# Patient Record
Sex: Female | Born: 1950 | Race: Black or African American | Hispanic: No | Marital: Married | Smoking: Former smoker
Health system: Southern US, Community
[De-identification: ages and names within clinical notes are randomized; demographics above are authoritative.]

## PROBLEM LIST (undated history)

## (undated) DIAGNOSIS — F988 Other specified behavioral and emotional disorders with onset usually occurring in childhood and adolescence: Secondary | ICD-10-CM

## (undated) DIAGNOSIS — H269 Unspecified cataract: Secondary | ICD-10-CM

## (undated) DIAGNOSIS — M461 Sacroiliitis, not elsewhere classified: Secondary | ICD-10-CM

## (undated) DIAGNOSIS — Z87898 Personal history of other specified conditions: Secondary | ICD-10-CM

## (undated) DIAGNOSIS — J0101 Acute recurrent maxillary sinusitis: Secondary | ICD-10-CM

## (undated) DIAGNOSIS — R112 Nausea with vomiting, unspecified: Secondary | ICD-10-CM

## (undated) DIAGNOSIS — G5603 Carpal tunnel syndrome, bilateral upper limbs: Secondary | ICD-10-CM

## (undated) DIAGNOSIS — I10 Essential (primary) hypertension: Secondary | ICD-10-CM

## (undated) DIAGNOSIS — D649 Anemia, unspecified: Secondary | ICD-10-CM

## (undated) DIAGNOSIS — E119 Type 2 diabetes mellitus without complications: Secondary | ICD-10-CM

## (undated) DIAGNOSIS — IMO0002 Reserved for concepts with insufficient information to code with codable children: Secondary | ICD-10-CM

## (undated) DIAGNOSIS — J302 Other seasonal allergic rhinitis: Secondary | ICD-10-CM

## (undated) DIAGNOSIS — Z8711 Personal history of peptic ulcer disease: Secondary | ICD-10-CM

## (undated) DIAGNOSIS — E871 Hypo-osmolality and hyponatremia: Secondary | ICD-10-CM

## (undated) DIAGNOSIS — K219 Gastro-esophageal reflux disease without esophagitis: Secondary | ICD-10-CM

## (undated) DIAGNOSIS — Z8739 Personal history of other diseases of the musculoskeletal system and connective tissue: Secondary | ICD-10-CM

## (undated) DIAGNOSIS — T7840XA Allergy, unspecified, initial encounter: Secondary | ICD-10-CM

## (undated) DIAGNOSIS — Z9889 Other specified postprocedural states: Secondary | ICD-10-CM

## (undated) DIAGNOSIS — J019 Acute sinusitis, unspecified: Secondary | ICD-10-CM

## (undated) DIAGNOSIS — N3941 Urge incontinence: Secondary | ICD-10-CM

## (undated) DIAGNOSIS — Z96659 Presence of unspecified artificial knee joint: Secondary | ICD-10-CM

## (undated) DIAGNOSIS — M4712 Other spondylosis with myelopathy, cervical region: Secondary | ICD-10-CM

## (undated) DIAGNOSIS — J3089 Other allergic rhinitis: Secondary | ICD-10-CM

## (undated) DIAGNOSIS — M19041 Primary osteoarthritis, right hand: Secondary | ICD-10-CM

## (undated) DIAGNOSIS — K649 Unspecified hemorrhoids: Secondary | ICD-10-CM

## (undated) DIAGNOSIS — M948X9 Other specified disorders of cartilage, unspecified sites: Secondary | ICD-10-CM

## (undated) DIAGNOSIS — D259 Leiomyoma of uterus, unspecified: Secondary | ICD-10-CM

## (undated) DIAGNOSIS — J0191 Acute recurrent sinusitis, unspecified: Secondary | ICD-10-CM

## (undated) DIAGNOSIS — H65199 Other acute nonsuppurative otitis media, unspecified ear: Secondary | ICD-10-CM

## (undated) DIAGNOSIS — M1990 Unspecified osteoarthritis, unspecified site: Secondary | ICD-10-CM

## (undated) DIAGNOSIS — E7800 Pure hypercholesterolemia, unspecified: Secondary | ICD-10-CM

## (undated) DIAGNOSIS — Z9289 Personal history of other medical treatment: Secondary | ICD-10-CM

## (undated) HISTORY — DX: Unspecified cataract: H26.9

## (undated) HISTORY — DX: Other allergic rhinitis: J30.89

## (undated) HISTORY — DX: Other seasonal allergic rhinitis: J30.2

## (undated) HISTORY — DX: Other specified behavioral and emotional disorders with onset usually occurring in childhood and adolescence: F98.8

## (undated) HISTORY — DX: Carpal tunnel syndrome, bilateral upper limbs: G56.03

## (undated) HISTORY — DX: Essential (primary) hypertension: I10

## (undated) HISTORY — DX: Type 2 diabetes mellitus without complications: E11.9

## (undated) HISTORY — DX: Pure hypercholesterolemia, unspecified: E78.00

## (undated) HISTORY — DX: Other specified disorders of cartilage, unspecified sites: M94.8X9

## (undated) HISTORY — DX: Other spondylosis with myelopathy, cervical region: M47.12

## (undated) HISTORY — DX: Hypo-osmolality and hyponatremia: E87.1

## (undated) HISTORY — DX: Acute recurrent maxillary sinusitis: J01.01

## (undated) HISTORY — DX: Personal history of other specified conditions: Z87.898

## (undated) HISTORY — DX: Presence of unspecified artificial knee joint: Z96.659

## (undated) HISTORY — DX: Allergy, unspecified, initial encounter: T78.40XA

## (undated) HISTORY — DX: Personal history of peptic ulcer disease: Z87.11

## (undated) HISTORY — DX: Sacroiliitis, not elsewhere classified: M46.1

## (undated) HISTORY — DX: Personal history of other medical treatment: Z92.89

## (undated) HISTORY — DX: Unspecified osteoarthritis, unspecified site: M19.90

## (undated) HISTORY — DX: Urge incontinence: N39.41

## (undated) HISTORY — DX: Other acute nonsuppurative otitis media, unspecified ear: H65.199

## (undated) HISTORY — DX: Personal history of other diseases of the musculoskeletal system and connective tissue: Z87.39

## (undated) HISTORY — DX: Acute sinusitis, unspecified: J01.90

## (undated) HISTORY — PX: BREAST LUMPECTOMY: SHX2

## (undated) HISTORY — PX: REPLACEMENT TOTAL KNEE BILATERAL: SUR1225

## (undated) HISTORY — DX: Unspecified hemorrhoids: K64.9

---
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
## 1998-03-11 ENCOUNTER — Encounter: Admission: RE | Admit: 1998-03-11 | Discharge: 1998-03-11 | Payer: Self-pay | Admitting: Family Medicine

## 1998-03-28 ENCOUNTER — Encounter: Admission: RE | Admit: 1998-03-28 | Discharge: 1998-03-28 | Payer: Self-pay | Admitting: Sports Medicine

## 1998-12-19 ENCOUNTER — Encounter: Admission: RE | Admit: 1998-12-19 | Discharge: 1998-12-19 | Payer: Self-pay | Admitting: Family Medicine

## 1999-01-27 ENCOUNTER — Encounter: Admission: RE | Admit: 1999-01-27 | Discharge: 1999-01-27 | Payer: Self-pay | Admitting: Family Medicine

## 1999-06-09 ENCOUNTER — Encounter: Admission: RE | Admit: 1999-06-09 | Discharge: 1999-06-09 | Payer: Self-pay | Admitting: Family Medicine

## 1999-06-25 ENCOUNTER — Encounter: Admission: RE | Admit: 1999-06-25 | Discharge: 1999-06-25 | Payer: Self-pay | Admitting: Family Medicine

## 1999-07-15 ENCOUNTER — Encounter: Admission: RE | Admit: 1999-07-15 | Discharge: 1999-07-15 | Payer: Self-pay | Admitting: Family Medicine

## 1999-08-18 ENCOUNTER — Encounter: Admission: RE | Admit: 1999-08-18 | Discharge: 1999-08-18 | Payer: Self-pay | Admitting: Family Medicine

## 1999-11-17 ENCOUNTER — Encounter: Admission: RE | Admit: 1999-11-17 | Discharge: 1999-11-17 | Payer: Self-pay | Admitting: Family Medicine

## 1999-12-23 ENCOUNTER — Encounter: Admission: RE | Admit: 1999-12-23 | Discharge: 1999-12-23 | Payer: Self-pay | Admitting: Sports Medicine

## 2000-07-01 ENCOUNTER — Encounter: Admission: RE | Admit: 2000-07-01 | Discharge: 2000-07-01 | Payer: Self-pay | Admitting: Family Medicine

## 2000-07-02 ENCOUNTER — Encounter: Admission: RE | Admit: 2000-07-02 | Discharge: 2000-07-02 | Payer: Self-pay | Admitting: Family Medicine

## 2000-07-15 ENCOUNTER — Encounter: Admission: RE | Admit: 2000-07-15 | Discharge: 2000-07-15 | Payer: Self-pay | Admitting: Family Medicine

## 2000-08-16 ENCOUNTER — Encounter: Admission: RE | Admit: 2000-08-16 | Discharge: 2000-08-16 | Payer: Self-pay | Admitting: Family Medicine

## 2000-09-13 ENCOUNTER — Encounter: Admission: RE | Admit: 2000-09-13 | Discharge: 2000-09-13 | Payer: Self-pay | Admitting: Family Medicine

## 2000-11-15 ENCOUNTER — Encounter: Admission: RE | Admit: 2000-11-15 | Discharge: 2000-11-15 | Payer: Self-pay | Admitting: Family Medicine

## 2000-12-17 HISTORY — PX: COLONOSCOPY W/ POLYPECTOMY: SHX1380

## 2000-12-24 ENCOUNTER — Encounter: Admission: RE | Admit: 2000-12-24 | Discharge: 2000-12-24 | Payer: Self-pay | Admitting: Family Medicine

## 2000-12-31 ENCOUNTER — Ambulatory Visit (HOSPITAL_COMMUNITY): Admission: RE | Admit: 2000-12-31 | Discharge: 2000-12-31 | Payer: Self-pay | Admitting: Gastroenterology

## 2001-05-05 ENCOUNTER — Encounter: Admission: RE | Admit: 2001-05-05 | Discharge: 2001-05-05 | Payer: Self-pay | Admitting: Family Medicine

## 2001-07-20 ENCOUNTER — Encounter: Admission: RE | Admit: 2001-07-20 | Discharge: 2001-07-20 | Payer: Self-pay | Admitting: Family Medicine

## 2001-08-22 ENCOUNTER — Encounter: Admission: RE | Admit: 2001-08-22 | Discharge: 2001-08-22 | Payer: Self-pay | Admitting: Family Medicine

## 2002-01-23 ENCOUNTER — Encounter: Admission: RE | Admit: 2002-01-23 | Discharge: 2002-01-23 | Payer: Self-pay | Admitting: Family Medicine

## 2002-05-22 ENCOUNTER — Encounter: Admission: RE | Admit: 2002-05-22 | Discharge: 2002-05-22 | Payer: Self-pay | Admitting: Family Medicine

## 2002-10-23 ENCOUNTER — Encounter: Admission: RE | Admit: 2002-10-23 | Discharge: 2002-10-23 | Payer: Self-pay | Admitting: Family Medicine

## 2002-11-27 ENCOUNTER — Encounter: Admission: RE | Admit: 2002-11-27 | Discharge: 2002-11-27 | Payer: Self-pay | Admitting: Family Medicine

## 2003-02-22 ENCOUNTER — Encounter: Admission: RE | Admit: 2003-02-22 | Discharge: 2003-02-22 | Payer: Self-pay | Admitting: Family Medicine

## 2003-04-30 ENCOUNTER — Encounter: Admission: RE | Admit: 2003-04-30 | Discharge: 2003-04-30 | Payer: Self-pay | Admitting: Family Medicine

## 2003-05-28 ENCOUNTER — Encounter: Payer: Self-pay | Admitting: Family Medicine

## 2003-05-28 ENCOUNTER — Encounter: Admission: RE | Admit: 2003-05-28 | Discharge: 2003-05-28 | Payer: Self-pay | Admitting: Family Medicine

## 2003-06-04 ENCOUNTER — Encounter: Admission: RE | Admit: 2003-06-04 | Discharge: 2003-06-04 | Payer: Self-pay | Admitting: Family Medicine

## 2003-06-05 ENCOUNTER — Encounter: Admission: RE | Admit: 2003-06-05 | Discharge: 2003-06-20 | Payer: Self-pay | Admitting: Family Medicine

## 2003-07-09 ENCOUNTER — Encounter: Admission: RE | Admit: 2003-07-09 | Discharge: 2003-07-09 | Payer: Self-pay | Admitting: Family Medicine

## 2003-07-30 ENCOUNTER — Encounter: Admission: RE | Admit: 2003-07-30 | Discharge: 2003-07-30 | Payer: Self-pay | Admitting: Family Medicine

## 2003-07-31 ENCOUNTER — Encounter: Admission: RE | Admit: 2003-07-31 | Discharge: 2003-07-31 | Payer: Self-pay | Admitting: Family Medicine

## 2003-10-22 ENCOUNTER — Encounter: Admission: RE | Admit: 2003-10-22 | Discharge: 2003-10-22 | Payer: Self-pay | Admitting: Family Medicine

## 2003-12-13 ENCOUNTER — Encounter: Admission: RE | Admit: 2003-12-13 | Discharge: 2003-12-13 | Payer: Self-pay | Admitting: Family Medicine

## 2003-12-17 ENCOUNTER — Encounter: Admission: RE | Admit: 2003-12-17 | Discharge: 2003-12-17 | Payer: Self-pay | Admitting: Family Medicine

## 2003-12-27 ENCOUNTER — Encounter: Admission: RE | Admit: 2003-12-27 | Discharge: 2003-12-27 | Payer: Self-pay | Admitting: Family Medicine

## 2004-01-31 ENCOUNTER — Encounter: Admission: RE | Admit: 2004-01-31 | Discharge: 2004-01-31 | Payer: Self-pay | Admitting: Family Medicine

## 2004-02-07 ENCOUNTER — Encounter: Admission: RE | Admit: 2004-02-07 | Discharge: 2004-02-07 | Payer: Self-pay | Admitting: Family Medicine

## 2004-02-11 ENCOUNTER — Encounter: Admission: RE | Admit: 2004-02-11 | Discharge: 2004-02-11 | Payer: Self-pay | Admitting: Family Medicine

## 2004-02-25 ENCOUNTER — Encounter: Admission: RE | Admit: 2004-02-25 | Discharge: 2004-02-25 | Payer: Self-pay | Admitting: Family Medicine

## 2004-04-18 HISTORY — PX: NM MYOVIEW LTD: HXRAD82

## 2004-04-28 ENCOUNTER — Inpatient Hospital Stay (HOSPITAL_COMMUNITY): Admission: EM | Admit: 2004-04-28 | Discharge: 2004-04-30 | Payer: Self-pay | Admitting: Emergency Medicine

## 2004-05-15 ENCOUNTER — Encounter: Admission: RE | Admit: 2004-05-15 | Discharge: 2004-05-15 | Payer: Self-pay | Admitting: Family Medicine

## 2004-06-16 ENCOUNTER — Encounter: Admission: RE | Admit: 2004-06-16 | Discharge: 2004-06-16 | Payer: Self-pay | Admitting: Family Medicine

## 2004-06-20 ENCOUNTER — Emergency Department (HOSPITAL_COMMUNITY): Admission: EM | Admit: 2004-06-20 | Discharge: 2004-06-20 | Payer: Self-pay | Admitting: Family Medicine

## 2004-07-28 ENCOUNTER — Ambulatory Visit: Payer: Self-pay | Admitting: Family Medicine

## 2004-08-04 ENCOUNTER — Ambulatory Visit: Payer: Self-pay | Admitting: Family Medicine

## 2004-08-07 ENCOUNTER — Ambulatory Visit (HOSPITAL_COMMUNITY): Admission: RE | Admit: 2004-08-07 | Discharge: 2004-08-07 | Payer: Self-pay | Admitting: Family Medicine

## 2004-08-19 HISTORY — PX: ENDOMETRIAL BIOPSY: SHX622

## 2004-09-01 ENCOUNTER — Ambulatory Visit: Payer: Self-pay | Admitting: Family Medicine

## 2004-10-16 ENCOUNTER — Ambulatory Visit: Payer: Self-pay | Admitting: Family Medicine

## 2004-10-19 DIAGNOSIS — Z9289 Personal history of other medical treatment: Secondary | ICD-10-CM

## 2004-10-19 HISTORY — DX: Personal history of other medical treatment: Z92.89

## 2004-11-13 ENCOUNTER — Ambulatory Visit: Payer: Self-pay | Admitting: Family Medicine

## 2004-11-17 ENCOUNTER — Inpatient Hospital Stay (HOSPITAL_COMMUNITY): Admission: RE | Admit: 2004-11-17 | Discharge: 2004-11-21 | Payer: Self-pay | Admitting: Orthopedic Surgery

## 2004-11-17 ENCOUNTER — Ambulatory Visit: Payer: Self-pay | Admitting: Physical Medicine & Rehabilitation

## 2004-11-17 ENCOUNTER — Ambulatory Visit: Payer: Self-pay | Admitting: Sports Medicine

## 2004-11-27 ENCOUNTER — Ambulatory Visit: Payer: Self-pay | Admitting: Family Medicine

## 2005-01-12 ENCOUNTER — Ambulatory Visit: Payer: Self-pay | Admitting: Family Medicine

## 2005-03-09 ENCOUNTER — Ambulatory Visit: Payer: Self-pay | Admitting: Family Medicine

## 2005-05-11 ENCOUNTER — Inpatient Hospital Stay (HOSPITAL_COMMUNITY): Admission: RE | Admit: 2005-05-11 | Discharge: 2005-05-14 | Payer: Self-pay | Admitting: Orthopedic Surgery

## 2005-07-02 ENCOUNTER — Ambulatory Visit: Payer: Self-pay | Admitting: Family Medicine

## 2005-07-23 ENCOUNTER — Ambulatory Visit: Payer: Self-pay | Admitting: Family Medicine

## 2005-08-17 ENCOUNTER — Ambulatory Visit: Payer: Self-pay | Admitting: Family Medicine

## 2005-09-17 ENCOUNTER — Ambulatory Visit: Payer: Self-pay | Admitting: Family Medicine

## 2006-01-18 ENCOUNTER — Ambulatory Visit: Payer: Self-pay | Admitting: Family Medicine

## 2006-02-22 ENCOUNTER — Ambulatory Visit: Payer: Self-pay | Admitting: Family Medicine

## 2006-03-19 ENCOUNTER — Encounter (INDEPENDENT_AMBULATORY_CARE_PROVIDER_SITE_OTHER): Payer: Self-pay | Admitting: *Deleted

## 2006-03-29 ENCOUNTER — Encounter: Payer: Self-pay | Admitting: Family Medicine

## 2006-03-29 ENCOUNTER — Ambulatory Visit: Payer: Self-pay | Admitting: Family Medicine

## 2006-04-09 ENCOUNTER — Ambulatory Visit: Payer: Self-pay | Admitting: Family Medicine

## 2006-08-02 ENCOUNTER — Ambulatory Visit: Payer: Self-pay | Admitting: Family Medicine

## 2006-09-20 ENCOUNTER — Ambulatory Visit: Payer: Self-pay | Admitting: Family Medicine

## 2006-12-16 DIAGNOSIS — E669 Obesity, unspecified: Secondary | ICD-10-CM | POA: Insufficient documentation

## 2006-12-16 DIAGNOSIS — E119 Type 2 diabetes mellitus without complications: Secondary | ICD-10-CM

## 2006-12-16 DIAGNOSIS — D126 Benign neoplasm of colon, unspecified: Secondary | ICD-10-CM

## 2006-12-16 DIAGNOSIS — J302 Other seasonal allergic rhinitis: Secondary | ICD-10-CM

## 2006-12-16 DIAGNOSIS — Z8711 Personal history of peptic ulcer disease: Secondary | ICD-10-CM

## 2006-12-16 DIAGNOSIS — N3941 Urge incontinence: Secondary | ICD-10-CM

## 2006-12-16 DIAGNOSIS — F988 Other specified behavioral and emotional disorders with onset usually occurring in childhood and adolescence: Secondary | ICD-10-CM

## 2006-12-16 DIAGNOSIS — M159 Polyosteoarthritis, unspecified: Secondary | ICD-10-CM

## 2006-12-16 DIAGNOSIS — M1990 Unspecified osteoarthritis, unspecified site: Secondary | ICD-10-CM

## 2006-12-16 DIAGNOSIS — J3089 Other allergic rhinitis: Secondary | ICD-10-CM

## 2006-12-16 DIAGNOSIS — I10 Essential (primary) hypertension: Secondary | ICD-10-CM

## 2006-12-16 DIAGNOSIS — K649 Unspecified hemorrhoids: Secondary | ICD-10-CM

## 2006-12-16 DIAGNOSIS — E1159 Type 2 diabetes mellitus with other circulatory complications: Secondary | ICD-10-CM

## 2006-12-16 DIAGNOSIS — K219 Gastro-esophageal reflux disease without esophagitis: Secondary | ICD-10-CM | POA: Insufficient documentation

## 2006-12-16 HISTORY — DX: Personal history of peptic ulcer disease: Z87.11

## 2006-12-16 HISTORY — DX: Urge incontinence: N39.41

## 2006-12-16 HISTORY — DX: Unspecified hemorrhoids: K64.9

## 2006-12-16 HISTORY — DX: Other seasonal allergic rhinitis: J30.2

## 2006-12-16 HISTORY — DX: Benign neoplasm of colon, unspecified: D12.6

## 2006-12-16 HISTORY — DX: Essential (primary) hypertension: I10

## 2006-12-16 HISTORY — DX: Other specified behavioral and emotional disorders with onset usually occurring in childhood and adolescence: F98.8

## 2006-12-16 HISTORY — DX: Unspecified osteoarthritis, unspecified site: M19.90

## 2006-12-16 HISTORY — DX: Type 2 diabetes mellitus without complications: E11.9

## 2006-12-17 ENCOUNTER — Encounter (INDEPENDENT_AMBULATORY_CARE_PROVIDER_SITE_OTHER): Payer: Self-pay | Admitting: *Deleted

## 2007-01-27 ENCOUNTER — Telehealth: Payer: Self-pay | Admitting: Family Medicine

## 2007-02-28 ENCOUNTER — Ambulatory Visit: Payer: Self-pay | Admitting: Family Medicine

## 2007-02-28 DIAGNOSIS — E7800 Pure hypercholesterolemia, unspecified: Secondary | ICD-10-CM

## 2007-02-28 DIAGNOSIS — E1169 Type 2 diabetes mellitus with other specified complication: Secondary | ICD-10-CM | POA: Insufficient documentation

## 2007-02-28 HISTORY — DX: Pure hypercholesterolemia, unspecified: E78.00

## 2007-03-16 ENCOUNTER — Telehealth (INDEPENDENT_AMBULATORY_CARE_PROVIDER_SITE_OTHER): Payer: Self-pay | Admitting: Family Medicine

## 2007-04-26 ENCOUNTER — Encounter: Payer: Self-pay | Admitting: Family Medicine

## 2007-05-04 ENCOUNTER — Encounter: Payer: Self-pay | Admitting: Family Medicine

## 2007-06-15 ENCOUNTER — Telehealth: Payer: Self-pay | Admitting: Family Medicine

## 2007-06-27 ENCOUNTER — Ambulatory Visit: Payer: Self-pay | Admitting: Family Medicine

## 2007-07-04 ENCOUNTER — Ambulatory Visit: Payer: Self-pay | Admitting: Family Medicine

## 2007-07-04 LAB — CONVERTED CEMR LAB
Albumin: 4 g/dL (ref 3.5–5.2)
CO2: 26 meq/L (ref 19–32)
Glucose, Bld: 131 mg/dL — ABNORMAL HIGH (ref 70–99)
LDH: 238 units/L (ref 94–250)
Potassium: 4.2 meq/L (ref 3.5–5.3)
Sodium: 138 meq/L (ref 135–145)
Total Protein: 7.3 g/dL (ref 6.0–8.3)

## 2007-08-31 ENCOUNTER — Encounter: Payer: Self-pay | Admitting: Family Medicine

## 2007-09-14 ENCOUNTER — Ambulatory Visit: Payer: Self-pay | Admitting: Family Medicine

## 2007-10-03 ENCOUNTER — Telehealth: Payer: Self-pay | Admitting: *Deleted

## 2007-10-04 ENCOUNTER — Ambulatory Visit: Payer: Self-pay | Admitting: Sports Medicine

## 2007-11-07 ENCOUNTER — Encounter: Payer: Self-pay | Admitting: Family Medicine

## 2007-11-07 ENCOUNTER — Ambulatory Visit: Payer: Self-pay | Admitting: Sports Medicine

## 2007-11-07 LAB — CONVERTED CEMR LAB: Hgb A1c MFr Bld: 6.7 %

## 2007-11-08 LAB — CONVERTED CEMR LAB
ALT: 25 units/L (ref 0–35)
AST: 19 units/L (ref 0–37)
Alkaline Phosphatase: 74 units/L (ref 39–117)
Creatinine, Ser: 0.91 mg/dL (ref 0.40–1.20)
Total Bilirubin: 0.4 mg/dL (ref 0.3–1.2)
Total CHOL/HDL Ratio: 2.9
VLDL: 19 mg/dL (ref 0–40)

## 2007-11-10 ENCOUNTER — Telehealth: Payer: Self-pay | Admitting: Family Medicine

## 2007-11-17 ENCOUNTER — Encounter: Payer: Self-pay | Admitting: Family Medicine

## 2007-12-07 ENCOUNTER — Ambulatory Visit: Payer: Self-pay | Admitting: Family Medicine

## 2007-12-26 ENCOUNTER — Ambulatory Visit: Payer: Self-pay | Admitting: Family Medicine

## 2007-12-27 ENCOUNTER — Encounter: Payer: Self-pay | Admitting: Family Medicine

## 2008-03-08 ENCOUNTER — Telehealth: Payer: Self-pay | Admitting: Family Medicine

## 2008-03-17 ENCOUNTER — Emergency Department (HOSPITAL_COMMUNITY): Admission: EM | Admit: 2008-03-17 | Discharge: 2008-03-17 | Payer: Self-pay | Admitting: Emergency Medicine

## 2008-03-26 ENCOUNTER — Ambulatory Visit: Payer: Self-pay | Admitting: Family Medicine

## 2008-03-26 ENCOUNTER — Emergency Department (HOSPITAL_COMMUNITY): Admission: EM | Admit: 2008-03-26 | Discharge: 2008-03-26 | Payer: Self-pay | Admitting: Emergency Medicine

## 2008-03-26 ENCOUNTER — Telehealth (INDEPENDENT_AMBULATORY_CARE_PROVIDER_SITE_OTHER): Payer: Self-pay | Admitting: *Deleted

## 2008-03-26 LAB — CONVERTED CEMR LAB: Hgb A1c MFr Bld: 7.2 %

## 2008-03-29 ENCOUNTER — Ambulatory Visit (HOSPITAL_COMMUNITY): Admission: RE | Admit: 2008-03-29 | Discharge: 2008-03-29 | Payer: Self-pay | Admitting: Chiropractic Medicine

## 2008-03-29 DIAGNOSIS — Q762 Congenital spondylolisthesis: Secondary | ICD-10-CM

## 2008-03-29 DIAGNOSIS — M948X9 Other specified disorders of cartilage, unspecified sites: Secondary | ICD-10-CM

## 2008-03-29 DIAGNOSIS — M47817 Spondylosis without myelopathy or radiculopathy, lumbosacral region: Secondary | ICD-10-CM

## 2008-03-29 HISTORY — DX: Other specified disorders of cartilage, unspecified sites: M94.8X9

## 2008-05-01 ENCOUNTER — Telehealth: Payer: Self-pay | Admitting: *Deleted

## 2008-05-14 ENCOUNTER — Ambulatory Visit: Payer: Self-pay | Admitting: Family Medicine

## 2008-06-26 ENCOUNTER — Telehealth: Payer: Self-pay | Admitting: *Deleted

## 2008-07-27 ENCOUNTER — Ambulatory Visit: Payer: Self-pay | Admitting: Family Medicine

## 2008-08-13 ENCOUNTER — Ambulatory Visit: Payer: Self-pay | Admitting: Family Medicine

## 2008-08-13 LAB — CONVERTED CEMR LAB: Hgb A1c MFr Bld: 7 %

## 2008-08-27 ENCOUNTER — Encounter: Payer: Self-pay | Admitting: Family Medicine

## 2008-11-12 ENCOUNTER — Telehealth: Payer: Self-pay | Admitting: Family Medicine

## 2008-11-22 ENCOUNTER — Ambulatory Visit: Payer: Self-pay | Admitting: Family Medicine

## 2008-11-22 LAB — CONVERTED CEMR LAB: Hgb A1c MFr Bld: 6.5 %

## 2008-11-23 LAB — CONVERTED CEMR LAB
Cholesterol: 198 mg/dL (ref 0–200)
HDL: 65 mg/dL (ref >39)
Total CHOL/HDL Ratio: 3
Triglycerides: 127 mg/dL (ref <150)

## 2009-01-01 ENCOUNTER — Telehealth: Payer: Self-pay | Admitting: Family Medicine

## 2009-01-07 ENCOUNTER — Ambulatory Visit: Payer: Self-pay | Admitting: Family Medicine

## 2009-01-08 DIAGNOSIS — M461 Sacroiliitis, not elsewhere classified: Secondary | ICD-10-CM | POA: Insufficient documentation

## 2009-01-08 HISTORY — DX: Sacroiliitis, not elsewhere classified: M46.1

## 2009-01-09 ENCOUNTER — Telehealth: Payer: Self-pay | Admitting: Family Medicine

## 2009-01-22 ENCOUNTER — Encounter: Admission: RE | Admit: 2009-01-22 | Discharge: 2009-02-13 | Payer: Self-pay | Admitting: Family Medicine

## 2009-01-31 ENCOUNTER — Ambulatory Visit: Payer: Self-pay | Admitting: Family Medicine

## 2009-02-04 ENCOUNTER — Encounter: Payer: Self-pay | Admitting: Family Medicine

## 2009-02-20 ENCOUNTER — Telehealth: Payer: Self-pay | Admitting: Family Medicine

## 2009-03-13 ENCOUNTER — Telehealth: Payer: Self-pay | Admitting: Family Medicine

## 2009-05-20 ENCOUNTER — Ambulatory Visit: Payer: Self-pay | Admitting: Family Medicine

## 2009-05-20 ENCOUNTER — Encounter: Payer: Self-pay | Admitting: Family Medicine

## 2009-05-20 LAB — CONVERTED CEMR LAB
ALT: 24 units/L (ref 0–35)
Albumin: 4.2 g/dL (ref 3.5–5.2)
CO2: 26 meq/L (ref 19–32)
Glucose, Bld: 126 mg/dL — ABNORMAL HIGH (ref 70–99)
Potassium: 3.9 meq/L (ref 3.5–5.3)
Sodium: 140 meq/L (ref 135–145)
Total Protein: 7.3 g/dL (ref 6.0–8.3)

## 2009-05-22 ENCOUNTER — Telehealth: Payer: Self-pay | Admitting: *Deleted

## 2009-05-23 ENCOUNTER — Encounter: Payer: Self-pay | Admitting: Family Medicine

## 2009-05-23 LAB — CONVERTED CEMR LAB: Pap Smear: NORMAL

## 2009-05-24 ENCOUNTER — Encounter: Payer: Self-pay | Admitting: Family Medicine

## 2009-07-19 DIAGNOSIS — Z87898 Personal history of other specified conditions: Secondary | ICD-10-CM

## 2009-07-19 HISTORY — PX: BREAST BIOPSY: SHX20

## 2009-07-19 HISTORY — DX: Personal history of other specified conditions: Z87.898

## 2009-09-25 ENCOUNTER — Telehealth: Payer: Self-pay | Admitting: Family Medicine

## 2009-11-07 ENCOUNTER — Encounter: Payer: Self-pay | Admitting: Family Medicine

## 2009-11-07 ENCOUNTER — Ambulatory Visit: Payer: Self-pay | Admitting: Family Medicine

## 2009-11-07 DIAGNOSIS — Z8739 Personal history of other diseases of the musculoskeletal system and connective tissue: Secondary | ICD-10-CM

## 2009-11-07 HISTORY — DX: Personal history of other diseases of the musculoskeletal system and connective tissue: Z87.39

## 2010-01-08 ENCOUNTER — Telehealth: Payer: Self-pay | Admitting: Family Medicine

## 2010-02-05 ENCOUNTER — Telehealth (INDEPENDENT_AMBULATORY_CARE_PROVIDER_SITE_OTHER): Payer: Self-pay | Admitting: Family Medicine

## 2010-03-22 ENCOUNTER — Emergency Department (HOSPITAL_COMMUNITY): Admission: EM | Admit: 2010-03-22 | Discharge: 2010-03-22 | Payer: Self-pay | Admitting: Emergency Medicine

## 2010-03-27 ENCOUNTER — Ambulatory Visit: Payer: Self-pay | Admitting: Family Medicine

## 2010-03-27 LAB — CONVERTED CEMR LAB: Hgb A1c MFr Bld: 8.3 %

## 2010-03-28 LAB — CONVERTED CEMR LAB
ALT: 33 units/L (ref 0–35)
Alkaline Phosphatase: 84 units/L (ref 39–117)
Creatinine, Ser: 0.82 mg/dL (ref 0.40–1.20)
Glucose, Bld: 168 mg/dL — ABNORMAL HIGH (ref 70–99)
Sodium: 136 meq/L (ref 135–145)
Total Bilirubin: 0.4 mg/dL (ref 0.3–1.2)
Total Protein: 7.4 g/dL (ref 6.0–8.3)

## 2010-05-21 ENCOUNTER — Emergency Department (HOSPITAL_COMMUNITY): Admission: EM | Admit: 2010-05-21 | Discharge: 2010-05-21 | Payer: Self-pay | Admitting: Emergency Medicine

## 2010-05-26 ENCOUNTER — Encounter: Payer: Self-pay | Admitting: Family Medicine

## 2010-07-15 ENCOUNTER — Telehealth: Payer: Self-pay | Admitting: *Deleted

## 2010-08-06 ENCOUNTER — Encounter: Payer: Self-pay | Admitting: Pharmacist

## 2010-08-12 ENCOUNTER — Ambulatory Visit: Payer: Self-pay | Admitting: Family Medicine

## 2010-08-20 ENCOUNTER — Encounter: Payer: Self-pay | Admitting: Family Medicine

## 2010-09-08 ENCOUNTER — Telehealth: Payer: Self-pay | Admitting: Family Medicine

## 2010-09-29 ENCOUNTER — Ambulatory Visit: Payer: Self-pay

## 2010-10-15 ENCOUNTER — Telehealth: Payer: Self-pay | Admitting: *Deleted

## 2010-10-27 ENCOUNTER — Encounter: Payer: Self-pay | Admitting: Family Medicine

## 2010-10-27 ENCOUNTER — Telehealth: Payer: Self-pay | Admitting: Family Medicine

## 2010-10-27 ENCOUNTER — Ambulatory Visit
Admission: RE | Admit: 2010-10-27 | Discharge: 2010-10-27 | Payer: Self-pay | Source: Home / Self Care | Attending: Family Medicine | Admitting: Family Medicine

## 2010-10-27 LAB — CONVERTED CEMR LAB
Direct LDL: 48 mg/dL
Vitamin B-12: 527 pg/mL

## 2010-10-28 ENCOUNTER — Encounter: Payer: Self-pay | Admitting: Family Medicine

## 2010-11-20 NOTE — Progress Notes (Signed)
Summary: Rx written wrong  Phone Note Call from Patient Call back at Home Phone [phone]   Reason for Call: Talk to Nurse Summary of Call: pt calling re: rx for robaxin, quantity is wrg, pt suppose to get 60 day supply Initial call taken by: Knox Royalty,  October 27, 2010 4:06 PM  Follow-up for Phone Call        to pcp to clarify quanity Follow-up by: Golden Circle RN,  October 27, 2010 4:58 PM  Additional Follow-up for Phone Call Additional follow up Details #1::        please notify Ms Tennison that Rx for robaxin sent to [facility] for 120 tablets Additional Follow-up by: Breanna Shorkey MD,  October 28, 2010 8:18 AM    Prescriptions: ROBAXIN-750 750 MG TABS (METHOCARBAMOL) 2 tablets by mouth 4 times a day as needed for muscle spasm  #120 x 5   Entered and Authorized by:   Tawanna Cooler Zaden Sako MD   Signed by:   Tawanna Cooler Armandina Iman MD on 10/28/2010   Method used:   Electronically to        [facility]. [phone]* (retail)       [address]       [place], [place]  [address]       Ph: [phone]       Fax: [phone]   RxID:   [id number]   Appended Document: Rx written wrong Patient informed.

## 2010-11-20 NOTE — Progress Notes (Signed)
Summary: Pharmacy Call  Phone Note Other Incoming Call back at [phone]   Caller: [facility] Summary of Call: Needs to verify a medication that the pt is on. Initial call taken by: Clydell Hakim,  January 08, 2010 11:27 AM  Follow-up for Phone Call        hydrocodone/acetaminophen sent in was for solution but directions stated tablets. patient has been getting tabs in past . advised pharmacist to changed to tablets. Follow-up by: Theresia Lo RN,  January 08, 2010 12:37 PM  Additional Follow-up for Phone Call Additional follow up Details #1::        I fax'dt Rx for Hydrocodone/APAP 7.5mg  tablets, 1 tablet three times a day as needed. Disp: 60. Refill: 1. Fax'd to [facility] on [address]. Additional Follow-up by: Tawanna Cooler McDiarmid MD,  January 09, 2010 9:22 AM    New/Updated Medications: HYDROCODONE-ACETAMINOPHEN 7.5-325 MG TABS (HYDROCODONE-ACETAMINOPHEN) One tablet three times a day if needed for pain Prescriptions: HYDROCODONE-ACETAMINOPHEN 7.5-325 MG TABS (HYDROCODONE-ACETAMINOPHEN) One tablet three times a day if needed for pain  #60 x 1   Entered and Authorized by:   Tawanna Cooler McDiarmid MD   Signed by:   Tawanna Cooler McDiarmid MD on 01/09/2010   Method used:   Printed then faxed to ...       [facility]. [phone]* (retail)       [address]       [place], [place]  [address]       Ph: [phone]       Fax: [phone]   RxID:   [id number]

## 2010-11-20 NOTE — Letter (Signed)
Summary: Lab results  [facility]  [address]   [place], [place] [address]   Phone: [phone]  Fax: [phone]    10/28/2010 MRN: [id number]  [address] [place], [place]  [address]  Dear [name],  Your blood tests from January 9th, 2012 showed good control of your cholesterol with your LDL "bad" cholesterol.  Your statin medication appears to be working well for you.    Your Vitamin B12 level was within a normal range. Your metformin medication does not appear to be causing you to be Vitamin B12 deficient.   Sincerely,   Tawanna Cooler Lamberto Dinapoli MD [facility]  Appended Document: Lab results sent

## 2010-11-20 NOTE — Progress Notes (Signed)
Summary: 1 month refill for naprosyn and hctz  Phone Note Refill Request Call back at [phone]   Refills Requested: Medication #1:  HYDROCHLOROTHIAZIDE 25 MG TABS One tablet each morning Disp: 60 tablets  Medication #2:  NAPROSYN 500 MG TABS Take 1 tablet by mouth twice a day Pt need to pick up to take to [place] for filling.  Initial call taken by: Abundio Miu,  October 15, 2010 11:36 AM  Follow-up for Phone Call        Scripts left up front in the hanging file.  Patient notified. Follow-up by: Dennison Nancy RN,  October 15, 2010 1:28 PM    Prescriptions: NAPROSYN 500 MG TABS (NAPROXEN) Take 1 tablet by mouth twice a day  #60 x 0   Entered and Authorized by:   Sarah Swaziland MD   Signed by:   Sarah Swaziland MD on 10/15/2010   Method used:   Handwritten   RxID:   [id number] HYDROCHLOROTHIAZIDE 25 MG TABS (HYDROCHLOROTHIAZIDE) One tablet each morning Disp: 60 tablets  #30 x 0   Entered and Authorized by:   Sarah Swaziland MD   Signed by:   Sarah Swaziland MD on 10/15/2010   Method used:   Handwritten   RxID:   [id number]  Pt was to come back 12-14 weeks after her June appt to follow up on her chronic issues.  Will refill for 1 month.  She should schedule an appt with Dr. McDiarmid. Sarah Swaziland MD  October 15, 2010 12:11 PM
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
## 2010-11-20 NOTE — Assessment & Plan Note (Signed)
Summary: flu shot,tcb  Nurse Visit   Vital Signs:  Patient profile:   60 year old female Temp:     98.3 degrees F  Vitals Entered By: Theresia Lo RN (August 12, 2010 3:21 PM)  Allergies: 1)  Arthrotec 50 (Diclofenac-Misoprostol) 2)  Celebrex (Celecoxib) 3)  * Troglitazone  Immunizations Administered:  Influenza Vaccine # 1:    Vaccine Type: Fluvax MCR    Site: right deltoid    Mfr: GlaxoSmithKline    Dose: 0.5 ml    Route: IM    Given by: Theresia Lo RN    Exp. Date: 04/15/2011    Lot #: [id number]    VIS given: 05/13/10 version given August 12, 2010.  Flu Vaccine Consent Questions:    Do you have a history of severe allergic reactions to this vaccine? no    Any prior history of allergic reactions to egg and/or gelatin? no    Do you have a sensitivity to the preservative Thimersol? no    Do you have a past history of Guillan-Barre Syndrome? no    Do you currently have an acute febrile illness? no    Have you ever had a severe reaction to latex? no    Vaccine information given and explained to patient? yes    Are you currently pregnant? no  Orders Added: 1)  Influenza Vaccine MCR [00025] 2)  Administration Flu vaccine - MCR [G0008]

## 2010-11-20 NOTE — Progress Notes (Signed)
Summary: refill  Phone Note Refill Request Call back at Home Phone [phone] Message from:  Patient  Refills Requested: Medication #1:  METHYLPHENIDATE HCL 10 MG TABS Take 2 tablet by mouth three a day Disp:360 Please call when ready  Initial call taken by: De Nurse,  February 05, 2010 4:47 PM  Follow-up for Phone Call        pcp on vacation. to another md for refill approval Follow-up by: Golden Circle RN,  February 05, 2010 4:47 PM  Additional Follow-up for Phone Call Additional follow up Details #1::        to notified, will pick up at front desk Additional Follow-up by: Gladstone Pih,  February 06, 2010 9:41 AM    Prescriptions: METHYLPHENIDATE HCL 10 MG TABS (METHYLPHENIDATE HCL) Take 2 tablet by mouth three a day Disp:360  #360 x 0   Entered and Authorized by:   Denny Levy MD   Signed by:   Denny Levy MD on 02/06/2010   Method used:   Print then Give to Patient   RxID:   [id number]   DEAR Keleigh Kazee TEAM RX TO YOUR DESK TOP

## 2010-11-20 NOTE — Consult Note (Signed)
Summary: [facility]   Imported By: Clydell Hakim 05/30/2010 14:44:13  _____________________________________________________________________  External Attachment:    Type:   Image     Comment:   External Document  Appended Document: [facility] Diabetic Eye Exam   Diabetes Management History:      She has not been enrolled in the "Diabetic Education Program".  She is checking home blood sugars.  She says that she is not exercising regularly.    Diabetes Management Exam:    Eye Exam:       Eye Exam done elsewhere          Date: 05/23/2010          Results: normal          Done by: [facility]  Diabetes Management Assessment/Plan:      The following lipid goals have been established for the patient: Total cholesterol goal of 200; LDL cholesterol goal of 100; HDL cholesterol goal of 40; Triglyceride goal of 150.  Her blood pressure goal is < 140/90.

## 2010-11-20 NOTE — Assessment & Plan Note (Signed)
Summary: f/u,df   Vital Signs:  Patient profile:   60 year old female Height:      61 inches Weight:      211 pounds BMI:     40.01 BSA:     1.93 Temp:     98.5 degrees F Pulse rate:   94 / minute BP sitting:   160 / 94  Vitals Entered By: Jone Baseman CMA (March 27, 2010 10:06 AM)  Serial Vital Signs/Assessments:  Time      Position  BP       Pulse  Resp  Temp     By                     154/76                         Tawanna Cooler Simranjit Thayer MD  CC: f/u Is Patient Diabetic? Yes Did you bring your meter with you today? No Pain Assessment Patient in pain? yes     Location: head Intensity: 7   Primary Care Provider:  Tawanna Cooler Roselle Norton MD  CC:  f/u.  History of Present Illness: DIABETES Disease Monitoring   Blood Sugar ranges:not checking at home   Polyuria:no   Visual problems:no  Medications   Compliance:frequently missing doses of metformin at night Side effects   Hypoglycemic symptoms:occassional feelings of hypoglycemia that she successfully self treats  Prevention   Eye exam UTD:   Monitoring feet:   Diet pattern:Eats plenty of vegetables and frutis   Exercise:none. Under stress from caring for her infant granddgt. Not sleeping well.   HYPERTENSION Disease Monitoring   Blood pressure range:not checking at home    Chest pain:none   Dyspnea:none   Claudication:none  Medications   Compliance:yes.  Side effects   Lightheadedness:no   Urinary frequency:no   Edema:no     Salt restriction:Avoids salting foods and salty tasting foods.   HYPERLIPIDEMIA Disease Monitoring   Chest pain:none   Dyspnea:none   Claudication:none  Medications   Compliance:yes.  Side effects   RUQ pain:no   Muscle aches:no             Habits & Providers  Alcohol-Tobacco-Diet     Alcohol drinks/day: 0     Tobacco Status: never  Current Medications (verified): 1)  Simvastatin 40 Mg Tabs (Simvastatin) .... Take 1 Tablet By Mouth At Bedtime 2)  Allegra 180 Mg  Tabs (Fexofenadine Hcl) .... Take 1 Tablet By Mouth Once A Day 3)  Lotrel 10-20 Mg Caps (Amlodipine Besy-Benazepril Hcl) .Marland Kitchen.. 1 Capsule By Mouth Each Morning 4)  Aspirin Ec 81 Mg Tbec (Aspirin) .... Take 1 Tablet By Mouth Once A Day 5)  Flonase 50 Mcg/act Susp (Fluticasone Propionate) .... Spray 2 Spray Into Both Nostrils Once A Day 6)  Hydrochlorothiazide 25 Mg Tabs (Hydrochlorothiazide) .... One Tablet Each Morning Disp: 60 Tablets 7)  Metformin Hcl 500 Mg Tb24 (Metformin Hcl) .... Three Tablets By Mouth At Night. 8)  Methylphenidate Hcl 10 Mg Tabs (Methylphenidate Hcl) .... Take 2 Tablet By Mouth Three A Day Disp:360 9)  Naprosyn 500 Mg Tabs (Naproxen) .... Take 1 Tablet By Mouth Twice A Day 10)  Eq Omeprazole 20 Mg  Tbec (Omeprazole) .Marland Kitchen.. 1 Tablet At Bedtime By Mouth 11)  Robaxin-750 750 Mg Tabs (Methocarbamol) .... 2 Tablets By Mouth 4 Times A Day As Needed For Muscle Spasm 12)  Hydrocodone-Acetaminophen 7.5-325 Mg Tabs (Hydrocodone-Acetaminophen) .... One Tablet Three Times  A Day If Needed For Pain  Allergies (verified): 1)  Arthrotec 50 (Diclofenac-Misoprostol) 2)  Celebrex (Celecoxib) 3)  * Troglitazone  Past History:  Past Surgical History: Bilateral TKR  Cardiolite 1997, normal -,  Cardiolite:EF63%, no ischemia - 05/01/2004,  colonoscopy (Dr Loreta Ave) int. 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 (06/04/2003)  TVUS: 4cm fibroid w/ submucosal component, bil.hydrosalpinges, unablemeasureendometrium - 08/18/2004, UGI series PUD - 10/19/1998 S/P Breast Biopsy [facility], [place], 07/2009): Benign findings.  PMH reviewed for relevance, PSH reviewed for relevance  Physical  Exam  General:  Obese. comfortable appearing.  No acute distress.  Vitals noted. Lungs:  Normal respiratory effort, Lungs are clear to auscultation, no crackles or wheezes. Heart:  normal rate, no murmur, and no gallop.   Extremities:  No peripheral edema.  Diabetes Management Exam:    Foot Exam (with socks and/or shoes not present):       Sensory-Monofilament:          Left foot: normal          Right foot: normal       Inspection:          Left foot: normal          Right foot: normal       Nails:          Left foot: thickened          Right foot: thickened   Impression & Recommendations:  Problem # 1:  DIABETES MELLITUS II, UNCOMPLICATED (ICD-250.00) Assessment Deteriorated A1C 8.3% is up from usual around 6.5%. Decline in glycemic control likely related to inconsistent intake of metformin.  Patient plans to consistently take metformin at bedtime.  No clinical evidence of new end organ damage.  Tolerating metformin.   Her updated medication list for this problem includes:    Lotrel 10-20 Mg Caps (Amlodipine besy-benazepril hcl) .Marland Kitchen... 1 capsule by mouth each morning    Aspirin Ec 81 Mg Tbec (Aspirin) .Marland Kitchen... Take 1 tablet by mouth once a day    Metformin Hcl 500 Mg Tb24 (Metformin hcl) .Marland Kitchen... Three tablets by mouth at night.  Orders: A1C-[facility] (98119) [facility]- Est  Level 4 (14782)  Problem # 2:  HYPERTENSION, BENIGN SYSTEMIC (ICD-401.1) Assessment: Deteriorated  Inadequate systolic pressure control.  No clinical evidence of new end organ damage.  Plan to increase Lotrel from 5/10 daily to 10/20 daily. Her updated medication list for this problem includes:    Lotrel 10-20 Mg Caps (Amlodipine besy-benazepril hcl) .Marland Kitchen... 1 capsule by mouth each morning    Hydrochlorothiazide 25 Mg Tabs (Hydrochlorothiazide) ..... One tablet each morning disp: 60 tablets  Orders: [facility]- Est  Level 4 (99214)  Problem # 3:  HYPERCHOLESTEROLEMIA (ICD-272.0)  Tolerating medication. No new organ damage.  Plancehck LDL and CMET today and to continue current medication. Her updated medication list for this problem includes:    Simvastatin 40 Mg Tabs (Simvastatin) .Marland Kitchen... Take 1 tablet by mouth at bedtime  Orders: Direct LDL-[facility] ([id number]) [facility]- Est  Level 4 (78469)  Complete Medication List: 1)  Simvastatin 40 Mg Tabs (Simvastatin) .... Take 1 tablet by mouth at bedtime 2)  Allegra 180 Mg Tabs (Fexofenadine hcl) .... Take  1 tablet by mouth once a day 3)  Lotrel 10-20 Mg Caps (Amlodipine besy-benazepril hcl) .Marland Kitchen.. 1 capsule by mouth each morning 4)  Aspirin Ec 81 Mg Tbec (Aspirin) .... Take 1 tablet by mouth once a day 5)  Flonase 50 Mcg/act Susp (Fluticasone propionate) .... Spray 2 spray into both nostrils once a day 6)  Hydrochlorothiazide 25 Mg Tabs (Hydrochlorothiazide) .... One tablet each morning disp: 60 tablets 7)  Metformin Hcl 500 Mg Tb24 (Metformin hcl) .... Three tablets by mouth at night. 8)  Methylphenidate Hcl 10 Mg Tabs (Methylphenidate hcl) .... Take 2 tablet by mouth three a day disp:360 9)  Naprosyn 500 Mg Tabs (Naproxen) .... Take 1 tablet by mouth twice a day 10)  Eq Omeprazole 20 Mg Tbec (Omeprazole) .Marland Kitchen.. 1 tablet at bedtime by mouth 11)  Robaxin-750 750 Mg Tabs (Methocarbamol) .... 2 tablets by mouth 4 times a day as needed for muscle spasm 12)  Hydrocodone-acetaminophen 7.5-325 Mg Tabs (Hydrocodone-acetaminophen) .... One tablet three times a day if needed for pain  Other Orders: Comp Met-[facility] ([id number])  Patient Instructions: 1)  Please schedule a follow-up appointment in 12 to 14 weeks. 2)  Take all three metformin tablets at bedtime to better control your diabetes  3)  Increase Lotrel to 10/20 mg capsule, one capsule  in morning to better control your blood pressure.. You can take two of your 5/10 mg Lotrel capsule daily until you run out.  There is a prescription for Lotrel 10/20 you can take to [place]. 4)  Stress is part of the increase in your blood  pressure and diabetes worsening.  A vacation would be very benefitial.  Prescriptions: LOTREL 10-20 MG CAPS (AMLODIPINE BESY-BENAZEPRIL HCL) 1 capsule by mouth each morning  #90 x 3   Entered and Authorized by:   Tawanna Cooler Shakim Faith MD   Signed by:   Tymothy Cass MD on 03/27/2010   Method used:   Print then Give to Patient   RxID:   [id number] ROBAXIN-750 750 MG TABS (METHOCARBAMOL) 2 tablets by mouth 4 times a day as needed for muscle spasm  #60 x 5   Entered and Authorized by:   Tawanna Cooler Caliann Leckrone MD   Signed by:   Tawanna Cooler Asah Lamay MD on 03/27/2010   Method used:   Print then Give to Patient   RxID:   [id number] EQ OMEPRAZOLE 20 MG  TBEC (OMEPRAZOLE) 1 tablet at bedtime by mouth  #90 x 3   Entered and Authorized by:   Tawanna Cooler Shernell Saldierna MD   Signed by:   Tawanna Cooler Gailen Venne MD on 03/27/2010   Method used:   Print then Give to Patient   RxID:   [id number] NAPROSYN 500 MG TABS (NAPROXEN) Take 1 tablet by mouth twice a day  #180 x 3   Entered and Authorized by:   Tawanna Cooler Malisha Mabey MD   Signed by:   Tawanna Cooler Briston Lax MD on 03/27/2010   Method used:   Print then Give to Patient   RxID:   [id number] METHYLPHENIDATE HCL 10 MG TABS (METHYLPHENIDATE HCL) Take 2 tablet by mouth three a day Disp:360  #360 x 0   Entered and Authorized by:   Tawanna Cooler Parks Czajkowski MD   Signed by:   Tawanna Cooler Charlyn Vialpando MD on 03/27/2010   Method used:   Print then Give to Patient   RxID:   [id number] METFORMIN HCL 500 MG TB24 (METFORMIN HCL) Three tablets by mouth at night.  #270 x 3   Entered and Authorized by:  Tawanna Cooler Tyrone Balash MD   Signed by:   Tawanna Cooler Joseeduardo Brix MD on 03/27/2010   Method used:   Print then Give to Patient   RxID:   [id number] HYDROCHLOROTHIAZIDE 25 MG TABS (HYDROCHLOROTHIAZIDE) One tablet each morning Disp: 60 tablets  #90 x 3   Entered and Authorized by:   Tawanna Cooler Harmon Bommarito MD   Signed by:   Tawanna Cooler Sharell Hilmer MD on 03/27/2010   Method used:   Print then Give to Patient   RxID:    [id number] FLONASE 50 MCG/ACT SUSP (FLUTICASONE PROPIONATE) Spray 2 spray into both nostrils once a day  #3 x 3   Entered and Authorized by:   Tawanna Cooler Levina Boyack MD   Signed by:   Tawanna Cooler Deserae Jennings MD on 03/27/2010   Method used:   Print then Give to Patient   RxID:   [id number] ALLEGRA 180 MG TABS (FEXOFENADINE HCL) Take 1 tablet by mouth once a day  #90 x 3   Entered and Authorized by:   Tawanna Cooler Rosalie Buenaventura MD   Signed by:   Tawanna Cooler Brenan Modesto MD on 03/27/2010   Method used:   Print then Give to Patient   RxID:   [id number] SIMVASTATIN 40 MG TABS (SIMVASTATIN) Take 1 tablet by mouth at bedtime  #90 x 3   Entered and Authorized by:   Tawanna Cooler Eliel Dudding MD   Signed by:   Tawanna Cooler Rajon Bisig MD on 03/27/2010   Method used:   Print then Give to Patient   RxID:   [id number]   Laboratory Results   Blood Tests   Date/Time Received: March 27, 2010 10:12 AM  Date/Time Reported: March 27, 2010 10:44 AM   HGBA1C: 8.3%   (Normal Range: Non-Diabetic - 3-6%   Control Diabetic - 6-8%)  Comments: ...............test performed by......Marland KitchenBonnie A. [place], MLS (ASCP)cm        Last Mammogram:  normal (03/31/2007 10:01:11 AM) Mammogram Result Date:  02/14/2010 Mammogram Result:  normal Mammogram Next Due:  6 mo    Past Surgical History:    Bilateral TKR     Cardiolite 1997, normal -,     Cardiolite:EF63%, no ischemia - 05/01/2004,     colonoscopy (Dr Loreta Ave) int. 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 (06/04/2003)     TVUS: 4cm fibroid w/ submucosal component, bil.hydrosalpinges, unablemeasureendometrium - 08/18/2004, UGI series PUD - 10/19/1998    S/P Breast Biopsy [facility], [place], 07/2009): Benign findings.     Prevention & Chronic Care Immunizations   Influenza vaccine: Fluvax 3+  (07/27/2008)   Influenza vaccine due: 07/27/2009    Tetanus booster: 01/17/2002: Done.   Tetanus booster due: 01/18/2012    Pneumococcal vaccine: Done.  (10/19/1994)   Pneumococcal vaccine due: None    H. zoster vaccine: Not documented  Colorectal Screening   Hemoccult: Not documented   Hemoccult due: Not Indicated    Colonoscopy: Done.  (01/17/2001)   Colonoscopy due: 01/18/2011  Other Screening   Pap smear: Normal  (05/23/2009)   Pap smear due: 05/2012    Mammogram: normal  (02/14/2010)   Mammogram action/deferral: Ordered  (05/20/2009)   Mammogram due: 08/16/2010  DXA bone density scan: Not documented   Smoking status: never  (03/27/2010)  Diabetes Mellitus   HgbA1C: 8.3  (03/27/2010)   Hemoglobin A1C due: 09/25/2008    Eye exam: Not documented    Foot exam: yes  (03/27/2010)   Foot exam action/deferral: Do today   High risk foot: Not documented   Foot care education: Not documented   Foot exam due: 05/14/2009    Urine microalbumin/creatinine ratio: Not documented   Urine microalbumin action/deferral: Not indicated    Diabetes flowsheet reviewed?: Yes   Progress toward A1C goal: Deteriorated  Lipids   Total Cholesterol: 198  (11/22/2008)   LDL: 108  (11/22/2008)   LDL Direct: Not documented   HDL: 65  (11/22/2008)   Triglycerides: 127  (11/22/2008)    SGOT (AST): 23  (05/20/2009)   SGPT (ALT): 24  (05/20/2009) CMP ordered    Alkaline phosphatase: 71  (05/20/2009)   Total bilirubin: 0.3  (05/20/2009)    Lipid flowsheet reviewed?: Yes   Progress toward LDL goal: At goal  Hypertension   Last Blood Pressure: 160 / 94  (03/27/2010)   Serum creatinine: 0.72  (05/20/2009)   Serum potassium 3.9  (05/20/2009) CMP ordered     Hypertension flowsheet reviewed?: Yes   Progress toward BP goal: Deteriorated  Self-Management  Support :    Diabetes self-management support: Written self-care plan, Education handout, Pre-printed educational material  (03/27/2010)   Diabetes care plan printed   Diabetes education handout printed    Hypertension self-management support: Education handout, Pre-printed educational material, Referred for self-management class  (03/27/2010)   Hypertension education handout printed    Lipid self-management support: Not documented     Lipid self-management support not done because: Good outcomes  (03/27/2010)

## 2010-11-20 NOTE — Miscellaneous (Signed)
Summary: Procedures Consent  Procedures Consent   Imported By: De Nurse 03/07/2010 16:32:51  _____________________________________________________________________  External Attachment:    Type:   Image     Comment:   External Document

## 2010-11-20 NOTE — Progress Notes (Signed)
  Phone Note Call from Patient   Caller: Patient Call For: [phone] Summary of Call: Need a referral for mammography to take to [facility] and refill for Methylphendate to take as well. Initial call taken by: Abundio Miu,  July 15, 2010 4:34 PM  Follow-up for Phone Call        Please let patient know she may pick up her Ritalin prescription and her screening mammogram prescription at the [facility].  Follow-up by: Tawanna Cooler McDiarmid MD,  July 16, 2010 1:38 PM  Additional Follow-up for Phone Call Additional follow up Details #1::        Patient informed, will come in today to pick up. Additional Follow-up by: Garen Grams LPN,  July 16, 2010 2:00 PM    Prescriptions: METHYLPHENIDATE HCL 10 MG TABS (METHYLPHENIDATE HCL) Take 2 tablet by mouth three a day Disp:360  #360 x 0   Entered and Authorized by:   Tawanna Cooler McDiarmid MD   Signed by:   Tawanna Cooler McDiarmid MD on 07/16/2010   Method used:   Handwritten   RxID:   [id number]     Handwritten Rx for Screening Mammogram for patient to take to [facility] for performance of the screening test.  Tawanna Cooler McDiarmid MD  July 16, 2010 1:34 PM

## 2010-11-20 NOTE — Assessment & Plan Note (Signed)
Summary: resch'd from 09/29/10/[facility]   Vital Signs:  Patient profile:   60 year old female Height:      61 inches Weight:      210 pounds BMI:     39.82 BSA:     1.93 Temp:     98.8 degrees F Pulse rate:   100 / minute BP sitting:   130 / 74  Vitals Entered By: Jone Baseman CMA (October 27, 2010 1:57 PM) CC: f/u Is Patient Diabetic? Yes Did you bring your meter with you today? No Pain Assessment Patient in pain? yes     Location: back, hips and right leg Intensity: 8   Primary Care Provider:  Tawanna Cooler Quincie Haroon MD  CC:  f/u.  History of Present Illness: DIABETES Disease Monitoring   Blood Sugar ranges:running in 120 to 150.    Polyuria: none   Visual problems:no  Medications   Compliance:yes, metformin 500mg  three tablets daily. Side effects   Hypoglycemic symptoms:none  Prevention   Eye exam UTD:UTD   Monitoring feet:yes   Diet pattern: watching fats and salts   Exercise:started walking exercise with dgt.      HYPERTENSION Disease Monitoring   Blood pressure range: not measuring at home   Chest pain:no   Dyspnea:no   Claudication:no  Medications   Compliance: tkaing her lotrel and hctz. Side effects   Lightheadedness: no   Urinary frequency:no   Edema:no     Prevention   Exercise:see above       Habits & Providers  Alcohol-Tobacco-Diet     Alcohol drinks/day: 0     Tobacco Status: never     Year Quit: 26 years ago     Passive Smoke Exposure: no  Current Medications (verified): 1)  Lotrel 10-20 Mg Caps (Amlodipine Besy-Benazepril Hcl) .Marland Kitchen.. 1 Capsule By Mouth Each Morning 2)  Aspirin Ec 81 Mg Tbec (Aspirin) .... Take 1 Tablet By Mouth Once A Day 3)  Flonase 50 Mcg/act Susp (Fluticasone Propionate) .... Spray 2 Spray Into Both Nostrils Once A Day 4)  Hydrochlorothiazide 25 Mg Tabs (Hydrochlorothiazide) .... One Tablet Each Morning Disp: 60 Tablets 5)  Metformin Hcl 500 Mg Tb24 (Metformin Hcl) .... Three Tablets By Mouth At Night. 6)   Methylphenidate Hcl 10 Mg Tabs (Methylphenidate Hcl) .... Take 2 Tablet By Mouth Three A Day Disp:360 7)  Naprosyn 500 Mg Tabs (Naproxen) .... Take 1 Tablet By Mouth Twice A Day 8)  Eq Omeprazole 20 Mg  Tbec (Omeprazole) .Marland Kitchen.. 1 Tablet At Bedtime By Mouth 9)  Robaxin-750 750 Mg Tabs (Methocarbamol) .... 2 Tablets By Mouth 4 Times A Day As Needed For Muscle Spasm 10)  Hydrocodone-Acetaminophen 7.5-325 Mg Tabs (Hydrocodone-Acetaminophen) .... One Tablet Three Times A Day If Needed For Pain 11)  Lipitor 40 Mg Tabs (Atorvastatin Calcium) .... One Tablet By Mouth At Bedtime  Allergies (verified): 1)  Arthrotec 50 (Diclofenac-Misoprostol) 2)  Celebrex (Celecoxib) 3)  * Troglitazone  Past History:  Past Medical History: In a study of  troglitazone, Patient developed whole body swelling, Eczema of Knees,  pomphlox of hands H.pylori titer negative,  HDL cholesterol >60,  L. 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 W/O OBSTRUCTION (ICD-533.90) Hx of INCONTINENCE, URGE (ICD-788.31) HYPERTENSION, BENIGN SYSTEMIC (ICD-401.1) Hx of HEMORRHOIDS,  NOS (ICD-455.6) GASTROESOPHAGEAL REFLUX, NO ESOPHAGITIS (ICD-530.81) DIABETES MELLITUS II, UNCOMPLICATED (ICD-250.00) COLON POLYP (ICD-211.3) ATTENTION DEFICIT, W/O HYPERACTIVITY (ICD-314.00)  Past Surgical History: Bilateral TKR Laparoscopy for infertility, Cardiolite 1997, normal -,  Cardiolite:EF63%, no ischemia - 05/01/2004,  colonoscopy (Dr Loreta Ave) int. hemorrhoids &  nonneoplatic colon polyps - 01/10/2001,  Endometrial Biopsy - 09/01/2004, Hip XRay Bilat: spurring grtr  troch, o/w unremarkable - 06/04/2003,  Knee XR Bilat: severe degen especiall medial compartments - 06/04/2003, L-S spine XR: DJD most notable  @ L4-5, L5-S1, retrolithesis L5 on L4 of 8 mm, slight anterior subluxation at L3 on L4. (06/04/2003)  TVUS: 4cm fibroid w/ submucosal component, bil.hydrosalpinges, unablemeasureendometrium - 08/18/2004, UGI series PUD - 10/19/1998 S/P Breast Biopsy [facility], [place], 07/2009): Benign findings.   Physical Exam  General:  alert, well-developed, and overweight-appearing.  NAD Neck:  no thyromegaly.   Lungs:  Normal respiratory effort, Lungs are clear to auscultation, no crackles or wheezes. Heart:  normal rate, regular rhythm, no murmur, no gallop, and no JVD.   Abdomen:  soft, non-tender, normal bowel sounds, no hepatomegaly, and no splenomegaly.   Msk:  normal ROM and no joint tenderness.   Pulses:  R dorsalis pedis normal and L dorsalis pedis normal.    Diabetes Management Exam:    Foot Exam (with socks and/or shoes not present):       Sensory-Pinprick/Light touch:          Left medial foot (L-4): normal          Left dorsal foot (L-5): normal          Left lateral foot (S-1): normal          Right medial foot (L-4): normal          Right dorsal foot (L-5): normal          Right lateral foot (S-1): normal       Sensory-Monofilament:          Left foot: normal          Right foot: normal       Inspection:          Left foot: normal          Right foot: normal       Nails:          Left foot: thickened          Right foot: thickened   Impression & Recommendations:  Problem # 1:  DIABETES MELLITUS II, UNCOMPLICATED (ICD-250.00) Assessment Improved Adequate control. Tolerating medication. No new organ damage. Plan to continue current medication and increase physical activity.  If no improvement in 3 months on RTC will consider increasing metformin and/or add OHG or DPP-IV inhibitor.  Pt did not tolerate a glitazone in past. Needs  ophthalmologic eye exam arranged next OV.   There was no decrease in serum Vitamin B12 in this patient on  chronic metformin therapy, though patient has recently started a daily vitamen B supplement which she is not sure of name or dose.    Her updated medication list for this problem includes:    Lotrel 10-20 Mg Caps (Amlodipine besy-benazepril hcl) .Marland Kitchen... 1 capsule by mouth each morning    Aspirin Ec 81 Mg Tbec (Aspirin) .Marland Kitchen... Take 1 tablet by mouth once a day    Metformin Hcl 500 Mg Tb24 (Metformin hcl) .Marland Kitchen... Three tablets by mouth at night.  Orders: A1C-[facility] (04540) [facility]- Est  Level 4 (98119)  Labs Reviewed: Creat:  0.82 (03/27/2010)     Last Eye Exam: normal (05/23/2010) Reviewed HgBA1c results: 7.9 (10/27/2010)  8.3 (03/27/2010)  Problem # 2:  HYPERTENSION, BENIGN SYSTEMIC (ICD-401.1)  Adequate control. Tolerating medication. No new organ damage. Plan to continue current medication. Needs BMET next visit  Her updated medication list for this problem includes:    Lotrel 10-20 Mg Caps (Amlodipine besy-benazepril hcl) .Marland Kitchen... 1 capsule by mouth each morning    Hydrochlorothiazide 25 Mg Tabs (Hydrochlorothiazide) ..... One tablet each morning disp: 60 tablets  Orders: [facility]- Est  Level 4 (99214)  Problem # 3:  HYPERCHOLESTEROLEMIA (ICD-272.0) Adequate control (LDL 48 mg/dL). Tolerating medication. No new organ damage. Plan to continue current medication.  Her updated medication list for this problem includes:    Lipitor 40 Mg Tabs (Atorvastatin calcium) ..... One tablet by mouth at bedtime  Orders: Direct LDL-[facility] (54098-11914) [facility]- Est  Level 4 (78295)  Problem # 4:  ATTENTION DEFICIT, W/O HYPERACTIVITY (ICD-314.00) Assessment: Unchanged Adequate control of inattention symptoms.  No adverse effects such as tremor, headache, anorexia.  No aberrant drug seeking behaviors displayed.   Refill for two month supply given to patient to fill at [facility] base pharmacy.    Problem # 5:  SPONDYLOSIS, LUMBAR (ICD-721.3) Suspicious for component of lumbar spinal stenosis with aching into buttocks and posterior thighs.  Occassional radiation below right knee into foot.  Adequate pain control with Naprosyn daily with intermittent Vicodin 7.5/325 three times a day as needed and Robaxin for intermittent associated muscle spasm.   No significant adverse effects.  Taking omeprasole for GI protection.  No aberrant drug seeking behavior displayed.  Patient feels medications help her remain able to perform her independent activities of daily living and care for her children and grandchildren.   Complete Medication List: 1)  Lotrel 10-20 Mg Caps (Amlodipine besy-benazepril hcl) .Marland Kitchen.. 1 capsule by mouth each morning 2)  Aspirin Ec 81 Mg Tbec (Aspirin) .... Take 1 tablet by mouth once a day 3)  Flonase 50 Mcg/act Susp (Fluticasone propionate) .... Spray 2 spray into both nostrils once a day 4)  Hydrochlorothiazide 25 Mg Tabs (Hydrochlorothiazide) .... One tablet each morning disp: 60 tablets 5)  Metformin Hcl 500 Mg Tb24 (Metformin hcl) .... Three tablets by mouth at night. 6)  Methylphenidate Hcl 10 Mg Tabs (Methylphenidate hcl) .... Take 2 tablet by mouth three a day disp:360 7)  Naprosyn 500 Mg Tabs (Naproxen) .... Take 1 tablet by mouth twice a day 8)  Eq Omeprazole 20 Mg Tbec (Omeprazole) .Marland Kitchen.. 1 tablet at bedtime by mouth 9)  Robaxin-750 750 Mg Tabs (Methocarbamol) .... 2 tablets by mouth 4 times a day as needed for muscle spasm 10)  Hydrocodone-acetaminophen 7.5-325 Mg Tabs (Hydrocodone-acetaminophen) .... One tablet three times a day if needed for pain 11)  Lipitor 40 Mg Tabs (Atorvastatin calcium) .... One tablet by mouth at bedtime  Other Orders: B12-[facility] ([id number])  Patient Instructions: 1)  Please schedule a follow-up appointment in 4 months  to recheck  Prescriptions: METHYLPHENIDATE HCL 10 MG TABS (METHYLPHENIDATE HCL) Take 2 tablet by mouth three a day  Disp:360  #360 x 0   Entered and Authorized by:   Tawanna Cooler Jeffie Spivack MD   Signed by:   Chaney Ingram MD on 10/27/2010   Method used:   Print then Give to Patient   RxID:   [id number] ROBAXIN-750 750 MG TABS (METHOCARBAMOL) 2 tablets by mouth 4 times a day as needed for muscle spasm  #60 Tablet x  5   Entered and Authorized by:   Tawanna Cooler Aerilyn Slee MD   Signed by:   Tawanna Cooler Emilyann Banka MD on 10/27/2010   Method used:   Print then Give to Patient   RxID:   [id number] EQ OMEPRAZOLE 20 MG  TBEC (OMEPRAZOLE) 1 tablet at bedtime by mouth  #90 x 3   Entered and Authorized by:   Tawanna Cooler Gaelyn Tukes MD   Signed by:   Tawanna Cooler Jadarious Dobbins MD on 10/27/2010   Method used:   Print then Give to Patient   RxID:   [id number] HYDROCHLOROTHIAZIDE 25 MG TABS (HYDROCHLOROTHIAZIDE) One tablet each morning Disp: 60 tablets  #90 x 3   Entered and Authorized by:   Tawanna Cooler Elliot Meldrum MD   Signed by:   Tawanna Cooler Kayleen Alig MD on 10/27/2010   Method used:   Print then Give to Patient   RxID:   [id number] NAPROSYN 500 MG TABS (NAPROXEN) Take 1 tablet by mouth twice a day  #180 x 3   Entered and Authorized by:   Tawanna Cooler Kaneesha Constantino MD   Signed by:   Tawanna Cooler Lorenda Grecco MD on 10/27/2010   Method used:   Print then Give to Patient   RxID:   [id number]    Orders Added: 1)  A1C-[facility] [83036] 2)  B12-[facility] [[id number]] 3)  Direct LDL-[facility] [[id number]] 4)  [facility]- Est  Level 4 [41660]    Laboratory Results   Blood Tests   Date/Time Received: October 27, 2010 1:51 PM  Date/Time Reported: October 27, 2010 2:09 PM   HGBA1C: 7.9%   (Normal Range: Non-Diabetic - 3-6%   Control Diabetic - 6-8%)  Comments: ...............test performed by......Marland KitchenBonnie A. [place], MLS (ASCP)cm       Prevention & Chronic Care Immunizations   Influenza vaccine: Fluvax MCR  (08/12/2010)   Influenza vaccine due: 07/27/2009    Tetanus booster: 01/17/2002: Done.   Tetanus booster due: 01/18/2012    Pneumococcal vaccine: Done.   (10/19/1994)   Pneumococcal vaccine due: None    H. zoster vaccine: Not documented  Colorectal Screening   Hemoccult: Not documented   Hemoccult due: Not Indicated    Colonoscopy: Done.  (01/17/2001)   Colonoscopy due: 01/18/2011  Other Screening   Pap smear: Normal  (05/23/2009)   Pap smear due: 05/2012    Mammogram: abnormal  (08/20/2010)   Mammogram action/deferral: Ordered  (07/15/2010)   Mammogram due: 02/18/2011    DXA bone density scan: Not documented   Smoking status: never  (10/27/2010)  Diabetes Mellitus   HgbA1C: 7.9  (10/27/2010)   Hemoglobin A1C due: 09/25/2008    Eye exam: normal  (05/23/2010)   Eye exam due: 05/24/2011    Foot exam: yes  (10/27/2010)   Foot exam action/deferral: Do today   High risk foot: Not documented   Foot care education: Not documented   Foot exam due: 05/14/2009    Urine microalbumin/creatinine ratio: Not documented   Urine microalbumin action/deferral: Not indicated    Diabetes flowsheet reviewed?: Yes   Progress toward A1C goal: At goal  Lipids   Total Cholesterol: 198  (11/22/2008)   LDL: 108  (11/22/2008)   LDL Direct: 136  (03/27/2010)   HDL: 65  (11/22/2008)   Triglycerides: 127  (11/22/2008)    SGOT (AST): 30  (03/27/2010)   SGPT (ALT): 33  (03/27/2010)   Alkaline phosphatase: 84  (03/27/2010)   Total bilirubin: 0.4  (03/27/2010)    Lipid flowsheet reviewed?: Yes   Progress toward LDL goal: Unchanged  Hypertension  Last Blood Pressure: 130 / 74  (10/27/2010)   Serum creatinine: 0.82  (03/27/2010)   Serum potassium 3.9  (03/27/2010)    Hypertension flowsheet reviewed?: Yes   Progress toward BP goal: At goal  Self-Management Support :   Personal Goals (by the next clinic visit) :     Personal A1C goal: 8  (10/27/2010)     Personal blood pressure goal: 140/90  (10/27/2010)     Personal LDL goal: 100  (10/27/2010)    Patient will work on the following items until the next clinic visit to reach self-care  goals:     Medications and monitoring: take my medicines every day, check my blood sugar, check my blood pressure, weigh myself weekly, examine my feet every day  (10/27/2010)     Eating: drink diet soda or water instead of juice or soda, eat more vegetables, use fresh or frozen vegetables, eat foods that are low in salt, eat baked foods instead of fried foods, eat fruit for snacks and desserts  (10/27/2010)     Activity: take a 30 minute walk every day, take the stairs instead of the elevator, park at the far end of the parking lot, join a walking program  (10/27/2010)    Diabetes self-management support: Written self-care plan, Education handout  (10/27/2010)   Diabetes care plan printed   Diabetes education handout printed    Hypertension self-management support: Written self-care plan  (10/27/2010)   Hypertension self-care plan printed.    Lipid self-management support: Written self-care plan  (10/27/2010)   Lipid self-care plan printed.    Lipid self-management support not done because: Good outcomes  (03/27/2010)   Past Medical History:    In a study of  troglitazone, Patient developed whole body swelling,    Eczema of Knees,  pomphlox of hands    H.pylori titer negative,     HDL cholesterol >60,     L. 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 W/O OBSTRUCTION (ICD-533.90)    Hx of INCONTINENCE, URGE (ICD-788.31)    HYPERTENSION, BENIGN SYSTEMIC (ICD-401.1)    Hx of HEMORRHOIDS, NOS (ICD-455.6)    GASTROESOPHAGEAL REFLUX, NO ESOPHAGITIS (ICD-530.81)    DIABETES  MELLITUS II, UNCOMPLICATED (ICD-250.00)    COLON POLYP (ICD-211.3)    ATTENTION DEFICIT, W/O HYPERACTIVITY (ICD-314.00)          Past Surgical History:    Bilateral TKR    Laparoscopy for infertility,    Cardiolite 1997, normal -,     Cardiolite:EF63%, no ischemia - 05/01/2004,     colonoscopy (Dr Loreta Ave) int. 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 (06/04/2003)     TVUS: 4cm fibroid w/ submucosal component, bil.hydrosalpinges, unablemeasureendometrium - 08/18/2004,  UGI series PUD - 10/19/1998    S/P Breast Biopsy [facility], [place], 07/2009): Benign findings.

## 2010-11-20 NOTE — Progress Notes (Signed)
Summary: Rx  Phone Note Call from Patient Call back at Home Phone [phone]   Reason for Call: Refill Medication Summary of Call: req handwritten rx for hydrocodone for 2 months, pt sts [employer] ins is changing & she can take it to [place] to get it free.  Initial call taken by: Knox Royalty,  September 08, 2010 2:16 PM    Prescriptions: HYDROCODONE-ACETAMINOPHEN 7.5-325 MG TABS (HYDROCODONE-ACETAMINOPHEN) One tablet three times a day if needed for pain  #120 x 2   Entered and Authorized by:   Tawanna Cooler Jenascia Bumpass MD   Signed by:   Tawanna Cooler Denver Bentson MD on 09/08/2010   Method used:   Print then Give to Patient   RxID:   [id number]

## 2010-11-20 NOTE — Miscellaneous (Signed)
Summary: CBC = chronic metformin use  Clinical Lists Changes  Orders: Added new Test order of CBC-[facility] (44010) - Signed

## 2010-11-20 NOTE — Miscellaneous (Signed)
Summary: Disability form   Patient dropped off disability papers to be filled out. Please call her when completed. Bradly Bienenstock  March 27, 2010 4:24 PM  forms to pcp.Golden Circle RN  March 27, 2010 4:30 PM  Form completed and signed and given to Golden Circle, RN Pratik Dalziel MD  March 28, 2010 9:13 AM    Appended Document: Disability form spoke with pt. she will be by today to get them. they are in box up front

## 2011-01-06 ENCOUNTER — Telehealth: Payer: Self-pay | Admitting: Family Medicine

## 2011-01-06 NOTE — Telephone Encounter (Signed)
Pt requesting referral order for her 6 month mammogram to take to [facility] center.

## 2011-01-08 ENCOUNTER — Ambulatory Visit: Payer: Self-pay | Admitting: Family Medicine

## 2011-01-08 ENCOUNTER — Encounter: Payer: Self-pay | Admitting: Family Medicine

## 2011-01-08 NOTE — Telephone Encounter (Signed)
Patient informed, expressed understanding. 

## 2011-01-08 NOTE — Telephone Encounter (Signed)
Please let [name] know that I mailed her the prescription for her mammogram to take to [facility] [place].

## 2011-02-02 ENCOUNTER — Ambulatory Visit (INDEPENDENT_AMBULATORY_CARE_PROVIDER_SITE_OTHER): Payer: Medicare Other | Admitting: Family Medicine

## 2011-02-02 ENCOUNTER — Encounter: Payer: Self-pay | Admitting: Family Medicine

## 2011-02-02 VITALS — BP 157/75 | HR 112 | Temp 99.0°F | Wt 205.4 lb

## 2011-02-02 DIAGNOSIS — J0100 Acute maxillary sinusitis, unspecified: Secondary | ICD-10-CM

## 2011-02-02 DIAGNOSIS — I10 Essential (primary) hypertension: Secondary | ICD-10-CM

## 2011-02-02 DIAGNOSIS — E119 Type 2 diabetes mellitus without complications: Secondary | ICD-10-CM

## 2011-02-02 DIAGNOSIS — D259 Leiomyoma of uterus, unspecified: Secondary | ICD-10-CM

## 2011-02-02 HISTORY — DX: Leiomyoma of uterus, unspecified: D25.9

## 2011-02-02 LAB — POCT GLYCOSYLATED HEMOGLOBIN (HGB A1C): Hemoglobin A1C: 7.8

## 2011-02-02 MED ORDER — AZITHROMYCIN 250 MG PO TABS: ORAL_TABLET | ORAL | Status: AC

## 2011-02-02 NOTE — Patient Instructions (Addendum)
Your Blood pressure is 157/75 today.  Your goal is less than 140/90.  It is likely that your illness is causing your blood pressure to increase.  Recheck your blood pressure in a week.  If it is staying over 140/90 most of the time, let Dr Annaclaire Walsworth know.  Your A1C is 7.8% which is better than the 8.3% in January.  Getting back to your exercising daily for at least 20 minutes a day will help reduce your A1C.   Take the Z-Pak antibiotic as directed for your sinusitis.   Use your Flonase (fluticasone) two sprays each nostril twice a day for next two weeks to help treat this sinusitis. Use Afrin nasal spray (oxymetasolone) twice a day to help your sinus cavities to drain out the infection. Use saline nasal spray every few hours to help your sinus cavities to drain. Use the naprosyn for the fever and aching from the infection.   If you are not better in a week or if you worsen, let Dr Lizet Kelso know.  Place sinusitis patient instructions here.

## 2011-02-03 ENCOUNTER — Encounter: Payer: Self-pay | Admitting: Family Medicine

## 2011-02-03 NOTE — Progress Notes (Signed)
  Subjective:    Patient ID: [name], female    DOB: 1951-07-28, 60 y.o.   MRN: [id number]  HPI CHRONIC DIABETES  Disease Monitoring  Blood Sugar Ranges: 140-160  Polyuria: no   Visual problems: no   Medication Compliance: yes  Medication Side Effects  Hypoglycemia: no   Preventitive Health Care   Diet pattern: eating more fat in diet.  Exercise: not taking formal exercise  UPPER RESPIRATORY INFECTION  Onset: 3 days ago  Course: gradual worsening Better with: nothing Meds tried: none Sick contacts: sick child in home   Nasal discharge (color,laterality): none  Sinusitis Risk Factors Fever: yes, low grade   Headache/face pain: yes  Double sickening: no  Tooth pain: no   Allergy Risk Factors: Sneezing: no  Itchy scratchy throat: yes  Seasonal sx: yes   Flu Risk Factors Headache: yes  Muscle aches: yes  Severe fatigue: no    Red Flags  Stiff neck: no  Dyspnea: no  Rash: no  Swallowing difficulty: no  Medications, past medical history,  family history, social history were reviewed and updated.            Review of Systems See hpi     Objective:   Physical Exam  Constitutional: Vital signs are normal.  Non-toxic appearance. She has a sickly appearance.       obese  HENT:  Ears:       Hoarse voice  Eyes: Conjunctivae are normal.       Periorbital puffiness bilaterally  Neck: No mass and no thyromegaly present.  Cardiovascular: Regular rhythm, normal heart sounds and intact distal pulses.   Pulmonary/Chest: Breath sounds normal.  Musculoskeletal:       Feet:  Lymphadenopathy:       Head (right side): No tonsillar, no preauricular and no posterior auricular adenopathy present.       Head (left side): No tonsillar, no preauricular and no posterior auricular adenopathy present.    She has no cervical adenopathy.          Assessment & Plan:

## 2011-02-03 NOTE — Assessment & Plan Note (Addendum)
Improved A1C control but not at goal yet.  Pt resistant to addition of further medications and wants to emphasize improved diet and exercise.  Will recheck in 3 months.  Continue metformin 1500 mg daily.

## 2011-02-03 NOTE — Assessment & Plan Note (Signed)
Inadequate current BP control.  Suspect component of current URI and obesity contributing.  Patient resistant to adding BP med at this time.  She plans to work on weight reduction with diet and increase exercise.  Will recheck in 3 to 4 months.  She will monitor BP at home/

## 2011-02-18 ENCOUNTER — Telehealth: Payer: Self-pay | Admitting: Family Medicine

## 2011-02-18 ENCOUNTER — Other Ambulatory Visit: Payer: Self-pay | Admitting: Family Medicine

## 2011-02-18 MED ORDER — METHYLPHENIDATE HCL 10 MG PO TABS: 20.0000 mg | ORAL_TABLET | Freq: Three times a day (TID) | ORAL | Status: DC

## 2011-02-18 NOTE — Telephone Encounter (Signed)
Please let [name] know that her and Eric's prescriptions are ready for pick-up from the [facility] front desk.

## 2011-02-18 NOTE — Telephone Encounter (Signed)
[name] informed

## 2011-02-18 NOTE — Telephone Encounter (Signed)
Needs 2 mo refill on Methylphenidate Please call when ready

## 2011-02-23 ENCOUNTER — Other Ambulatory Visit: Payer: Self-pay | Admitting: Family Medicine

## 2011-02-23 NOTE — Telephone Encounter (Signed)
Refill

## 2011-03-06 NOTE — Procedures (Signed)
[place]. [facility]  Patient:    [name], [name]                         MRN: [id number] Proc. Date: 12/31/00 Adm. Date:  [id number] Attending:  Charna Elizabeth CC:         Huey Bienenstock McDiarmid, M.D.   Procedure Report  DATE OF BIRTH:  November 06, 1938.  PROCEDURE:  Colonoscopy with hot biopsy x 2.  ENDOSCOPIST:  Anselmo Rod, M.D.  INSTRUMENT USED:  Olympus video colonoscope.  INDICATION FOR PROCEDURE:  Rectal bleeding with change in bowel habits in a 60 year old African-American female.  Rule out colonic polyps, masses, hemorrhoids, etc.  PREPROCEDURE PREPARATION:  Informed consent was procured from the patient. The patient was fasted for eight hours prior to the procedure and prepped with a bottle of magnesium citrate and a gallon of NuLytely the night prior to the procedure.  PREPROCEDURE PHYSICAL:  VITAL SIGNS:  The patient had stable vital signs.  NECK:  Supple.  CHEST:  Clear to auscultation.  S1, S2 regular.  ABDOMEN:  Soft with normal abdominal bowel sounds.  DESCRIPTION OF PROCEDURE:  The patient was placed in the left lateral decubitus position and sedated with 50 mg of Demerol and 5 mg of Versed intravenously.  Once the patient was adequately sedate and maintained on low-flow oxygen and continuous cardiac monitoring, the Olympus video colonoscope was advanced from the rectum to the cecum with difficulty secondary to large amount of residual stool in the colon.  Two small polyps were removed by hot biopsy forceps from the rectum.  Small internal and external hemorrhoids were seen on retroflexion and anal inspection, respectively.  There was a large amount of residual stool, especially in the cecum, right colon, and transverse colon.  The patients position was changed from the left lateral to the supine and right lateral position to facilitate adequate visualization of the cecal base and the right colon.  IMPRESSION: 1. Two small  sessile polyps, hot biopsied from rectum. 2. Small, nonbleeding internal and external hemorrhoids. 3. Otherwise normal colon. 4. Significant amount of residual stool in the colon.  Very small lesion may    have been missed.  RECOMMENDATIONS: 1. Await pathology results. 2. Increase fluid and fiber in the diet. 3. Outpatient follow-up in the next two weeks. DD:  12/31/00 TD:  01/01/01 Job: [id number]
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
## 2011-03-06 NOTE — Discharge Summary (Signed)
NAMEKALYSSA, ANKER                  ACCOUNT NO.:  [id number]   MEDICAL RECORD NO.:  [id number]          PATIENT TYPE:  INP   LOCATION:  5022                         FACILITY:  [facility]   PHYSICIAN:  Robert A. Thurston Hole, M.D. DATE OF BIRTH:  1951/02/05   DATE OF ADMISSION:  05/11/2005  DATE OF DISCHARGE:  05/14/2005                                 DISCHARGE SUMMARY   ADMISSION DIAGNOSES:  1.  End-stage degenerative joint disease, left knee.  2.  Hypertension.  3.  Diabetes.  4.  Attention deficit disorder.  5.  Gastroesophageal reflux.   DISCHARGE DIAGNOSES:  1.  End-stage degenerative joint disease, left knee, status post total knee      replacement.  2.  Hypertension.  3.  Diabetes.  4.  Attention deficit disorder.  5.  Gastroesophageal reflux.  6.  Postoperative blood loss anemia.   HISTORY OF PRESENT ILLNESS:  The patient is a 60 year old female who has end-  stage DJD of her left knee. She has tried conservative care including anti-  inflammatories and physical therapy without success. She understands the  risks, benefits, and possible complications. She understands the risks,  benefits, and possible complications of a left total knee replacement as she  had her right total knee replacement in January 2006.   PROCEDURES IN-HOUSE:  On May 11, 2005, the patient underwent a left total  knee by Dr. Thurston Hole. She tolerated the procedure well and tolerated a femoral  nerve block well.   HOSPITAL COURSE:  She was admitted postoperatively for pain control, DVT  prophylaxis, and physical therapy. Postoperative day one, vital signs were  stable, she was afebrile, her sugars were under control, her hemoglobin was  9.6, INR was 1.1. She was metabolically stable. Her wound was well  approximated. Her drain was pulled. Her dressing was changed. TED hose  applied to left leg. PCA was discontinued. She was placed on Percocet for  pain. CPM 0-90 degrees. Elevated her left heel on a folded  pillow. Diabetes  consult was ordered due to her history of diabetes. Postoperative day two,  the patient was doing well. Hemoglobin was 8.7. The patient was asymptomatic  with a postoperative blood loss anemia, so this was held. She did have some  hyponatremia. Her IV was hep locked, so Foley was discontinued. Her dressing  was changed. TED hose were placed. On postoperative day three, the patient  continued to progress well. Her hemoglobin was 8.9, INR was 1.7, sodium was  130. She was otherwise metabolically stable. Her surgical wound was well  approximated and healed. She was discharged to home in stable condition,  weightbearing as tolerated, on a regular diet.   DISCHARGE MEDICATIONS:  1.  Percocet one to two q.4-6h. p.r.n. pain.  2.  Robaxin 500 mg one p.o. q.4-6h. p.r.n. muscle spasm.  3.  Coumadin 5 mg one p.o. daily.  4.  Glucophage 500 mg two p.o. q.a.m. and one p.o. q.p.m.  5.  AcipHex 25 mg one p.o. daily.  6.  Allegra 180 mg one p.o. daily.  7.  Ritalin 15 mg  one tablet b.i.d.  8.  Zocor 40 mg one tablet a day.  9.  Colace 100 mg one tablet b.i.d.  10. Senokot-S two before dinner.   DISCHARGE INSTRUCTIONS:  1.  Elevate her left heel on a folded pillow every morning for 30 minutes.  2.  Use CPM 0-90 degrees eight hours a day.  3.  Call with increased temperature, increased drainage, increased swelling,      increased redness, or temperature greater than 101.  4.  She can shower on May 19, 2005, ambulate with a walker, increase her      activity slowly, no driving for four weeks. Always walk with assistance.      Kirstin Shepperson, P.A.      Robert A. Thurston Hole, M.D.  Electronically Signed    KS/MEDQ  D:  08/25/2005  T:  08/25/2005  Job:  [id number]

## 2011-03-06 NOTE — Op Note (Signed)
[name], [name]                  ACCOUNT NO.:  [id number]   MEDICAL RECORD NO.:  [id number]          PATIENT TYPE:  INP   LOCATION:  2550                         FACILITY:  [facility]   PHYSICIAN:  Robert A. Thurston Hole, M.D. DATE OF BIRTH:  1951-04-02   DATE OF PROCEDURE:  11/17/2004  DATE OF DISCHARGE:                                 OPERATIVE REPORT   PREOPERATIVE DIAGNOSIS:  Right knee degenerative joint disease.   POSTOPERATIVE DIAGNOSIS:  Right knee degenerative joint disease.   PROCEDURE:  1.  Right total knee replacement using [employer] Scorpio total knee system      with #5 cemented femur, #5 cemented tibia with 15 mm polyethylene flex      tibial spacer and 26 mm polyethylene cemented patella.  2.  Right knee lateral retinacular release.   SURGEON:  Elana Alm. Thurston Hole, M.D.   ASSISTANT:  Julien Girt, P.A.   ANESTHESIA:  General.   OPERATIVE TIME:  1 hour 40 minutes.   COMPLICATIONS:  None.   DESCRIPTION OF PROCEDURE:  [name] was brought to operating room on November 17, 2004, and was placed on the operating table in the supine position. She  received Ancef 1 g IV preoperatively for prophylaxis.  After being placed  under general anesthesia, the right knee was examined. Range of motion from  -5 to 125 degrees moderate varus deformity, knee stable, ligamentous exam  normal patellar tracking.  She had a Foley catheter placed under sterile  conditions.  Her right leg was then prepped using sterile DuraPrep and  draped using sterile technique.  The leg was exsanguinated and a thigh  tourniquet elevated 375 mm. Initially, through a 15 cm longitudinal incision  based over the patella, initial exposure  was made.  The underlying  subcutaneous tissues were incised along with the skin incision.  A median  arthrotomy was performed revealing the excess amount of normal-appearing  joint fluid.  The articular surfaces were inspected.  She had grade 4  changes medially, grade 3  changes laterally, grade 3 and 4 changes in the  patellofemoral joint. Medial and lateral meniscal remnants were removed as  well as the anterior cruciate ligament.  Osteophytes were removed off the  femoral condyles and tibial plateau. Intramedullary drill was then drilled  up the femoral canal for placement of the distal femoral cutting jig which  was placed in the appropriate amount of rotation and the distal 10 mm cut  was made.  Distal femur was incised.  A #5 was found be the appropriate  size.  A #5 cutting jig was placed, and then these cuts were made.  The  proximal tibia was exposed.  The tibial spines were made with an oscillating  saw.  Intramedullary drill was drilled down the tibial canal for placement  of the proximal tibial.  A cutting jig was placed in the appropriate amount  rotation and a proximal 10 mm cut was made.  After this was done, the  Scorpio PCL cutter was placed back on the distal femur, and these cuts were  made.  At this point, the #5 femoral trial was placed.  The #5 tibial base  plate trial was placed, and with a 15 mm polyethylene flex tibial spacer  there was found be excellent restoration of normal alignment, excellent  stability with correction of her varus deformity.  Range of motion 0-125  degrees. Tibial base plate was then marked for rotation, and the keel cut  was made.  After this was done, the patella was sized.  A 26 mm was found to  be the appropriate size and recessed 10 mm by 26 mm cut was made and three  locking holes were placed. After this was done, it was felt that all trial  components were of excellent size and stability.  They were then removed,  and the knee was then jet lavage irrigated with 3 liters of saline solution.  The proximal tibia was exposed. The #5 tibial baseplate with cement backing  was hammered into position with excellent fit with excess cement being  removed around the edges.  Then the #5 femoral component with cement  backing  was hammered into position also with an excellent fit with excess cement  being removed from around the edges.  The 15 mm polyethylene flex tibial  spacer was then locked on the tibial base plate.  The knee taken through  range of motion 0-120 degrees with excellent stability and excellent  correction of her varus deformity.  The 26 mm polyethylene cement backed  patella was then locked in its recessed hole and held there with a clamp.  After the cement hardened, patellofemoral tracking was evaluated. There was  moderate lateral tracking, thus a lateral retinacular release was carried  out improving tracking to normal. At this point, it was felt that all  components were of excellent size and stability.  The incision was further  irrigated with saline, and then the arthrotomy was closed with a #1 Ethilon  suture over two medium Hemovac drains.  Subcutaneous tissues were closed  with 0 and 2-0 Vicryl, skin closed with skin staples.  Sterile dressings  were applied.  The Hemovac was injected with 0.25% Marcaine with epinephrine  and 4 mg of morphine and clamped.  The tourniquet was released. The patient  was then awakened, extubated and taken to recovery room in stable condition.  Needle and sponge count was correct x2 at the end of the case.      RAW/MEDQ  D:  11/17/2004  T:  11/17/2004  Job:  [id number]

## 2011-03-06 NOTE — Op Note (Signed)
[name], [name]                  ACCOUNT NO.:  [id number]   MEDICAL RECORD NO.:  [id number]          PATIENT TYPE:  INP   LOCATION:  2899                         FACILITY:  [facility]   PHYSICIAN:  Molly Maduro A. Thurston Hole, M.D. DATE OF BIRTH:  April 25, 1951   DATE OF PROCEDURE:  05/11/2005  DATE OF DISCHARGE:                                 OPERATIVE REPORT   PREOPERATIVE DIAGNOSIS:  Left knee DJD.   POSTOPERATIVE DIAGNOSIS:  Left knee DJD.   PROCEDURES:  1.  Left total knee replacement using DePuy cemented total knee system with      2.5 cemented femur, #3 cemented tibia with 10 mm Polyethylene RP tibial      spacer and 35 mm polyethylene cemented patella.  2.  Left total knee computer assisted navigation.   SURGEON:  Elana Alm. Thurston Hole, M.D.   ASSISTANT:  Julien Girt, P.A.   ANESTHESIA:  General anesthesia.   OPERATIVE TIME:  One hour 45 minutes.   COMPLICATIONS:  None.   DESCRIPTION OF PROCEDURE:  Ms. Oleary was brought to the operating room on  May 11, 2005, after femoral nerve block had been placed in the holding room  by anesthesia for postoperative pain control.  She was placed on the  operating table in the supine position.  She received Ancef 1 g IV  preoperatively for prophylaxis.  Her left knee was examined and range of  motion from -6 to 125 degrees with moderate varus deformity.  Knee stable  ligamentous exam with normal patella tracking.  She had a Foley catheter  placed under sterile conditions.  Her left foot and leg was then prepped  using sterile DuraPrep and draped using sterile technique.  Leg was  exsanguinated and a thigh tourniquet elevated.  375 mm.  Initially through a  15 cm longitudinal incision based over the patella, initial exposure was  made.  The underlying subcutaneous tissues were incised along with skin  incision.  A median arthrotomy was performed revealing excessive amount of  normal-appearing joint fluid.  The articular surfaces were inspected.   She  had grade IV changes medially, grade III changes laterally and grade III and  IV changes in the patella femoral joint.  Osteophytes were removed from the  femoral condyles and tibial plateau.  The medial and lateral meniscal  remnants were removed as well as the anterior cruciate ligament.  At this  point, two pins were placed in the proximal tibia and two pins placed in the  distal femoral metaphysis for the computer assisted navigation.  The  navigation system was then activated.  Initial ranges of motion were shown  with flexion deformity of the knee of 6 to 7 degrees and varus deformity of  7 degrees.  At this point then after the activation and registration process  had been carried out using the computer navigation, the distal femoral cut  was made resecting 10 mm of distal femur in the appropriate position.  These  cuts were verified.  The 2.5 was found to be the appropriate size and the  2.5 cutting jig was  placed and these cuts were made.  Again, these were all  verified and found to be perfect and excellent cuts.  Proximal tibial was  then exposed.  Again, the computer navigation system was used to perform the  proximal tibial cut resecting the appropriate amount of proximal tibial bone  based on the computer navigation system.  These cuts were also verified and  found to an excellent result.  Flexion/extension blocks were used with a 10  mm block that gave off satisfactory and acceptable flexion and extension  gaps which were equal.  At this point, then the #3 tibial trial was placed  and the keel cut was made.  The PCL box cutter was then placed on the distal  femur and these cuts were made.  At this point, the 2.5 mm femoral trial was  placed.  The #3 tibial trial was placed and with a 10 mm polyethylene RP  tibial spacer, again the knee was taken through a range of motion.  There  was found to be excellent correction of her varus and flexion deformity with  excellent  range of motion.  The patella was then sized.  A 9 mm resurfacing  cut was made and a 35 mm patella button was placed after the three locking  holes were drilled and then patellofemoral tracking was evaluated and this  was found to be excellent and normal as well.  At this point, it was felt  that all the trial components were of excellent size, fit and stability and  the navigation system had worked satisfactorily.  The navigation pins were  then removed.  The trial components were then removed.  The knee was then  jet lavage irrigated with three liters of saline solution and the proximal  tibial was exposed and the #3 tibial component with cement backing was  hammered into position with excellent fit with excellent cement being  removed from around the edges.  The 2.5 mm distal femoral component with  cement backing was hammered into position also with an excellent fit with  excess cement being removed from around the edges.  The 10 mm polyethylene  RP tibial spacer was placed on the tibial base plate and the knee reduced  taken through full range of motion, found to be stable with excellent  correction of her varus and flexion deformity.  The 35 mm polyethylene  cement backed patella was then placed into its position and held there with  a clamp.  After the cement hardened, patellofemoral tracking was evaluated  and this was found to be excellent.  At this point, it was felt that all  components were of excellent size, fit and stability.  The wound was  irrigated again with saline and the tourniquet was released.  Hemostasis was  obtained with cautery.  The arthrotomy was closed with #1 Ethibond suture  over two medium Hemovac drains.  Subcutaneous tissue closed with 0 and 2-0  Vicryl.  Skin closed with subcuticular Prolene.  Steri-Strips were applied.  Sterile dressings and a long-legged splint applied and then patient  awakened, extubated and taken to the recovery room in stable  condition. Needle and sponge counts correct x2 at the end of the case.       RAW/MEDQ  D:  05/11/2005  T:  05/11/2005  Job:  [id number]   cc:   [name], M.D.  Fax: [phone]

## 2011-03-06 NOTE — Consult Note (Signed)
[name], [name]                            ACCOUNT NO.:  [id number]   MEDICAL RECORD NO.:  [id number]                   PATIENT TYPE:  INP   LOCATION:  3703                                 FACILITY:  [facility]   PHYSICIAN:  Meade Maw, M.D.                 DATE OF BIRTH:  07/14/1951   DATE OF CONSULTATION:  04/29/2004  DATE OF DISCHARGE:                                   CONSULTATION   REFERRING PHYSICIAN:  Dr. Leighton Roach. McDiarmid   INDICATION FOR CONSULTATION:  Chest pain.   HISTORY:  [name] is a pleasant 60 year old female who noted the initial onset  of chest heaviness on Saturday night.  This persisted for 15-20 minutes.  It  was described initially as a 10/10 and eventually resolved.  She was noted  to have palpitations prior to his episode of chest heaviness.  The chest  heaviness returned on Sunday and it was of mild severity, resolved  spontaneously.  On Monday a.m. while up and dressing, she again experienced  an episode of chest heaviness which persisted for approximately 1-1/2 hours.  She subsequently presented to the emergency room.  She was given one  sublingual nitroglycerin.  She is unsure as to whether the pain was relieved  with nitroglycerin or just resolved spontaneously.  She has also had a two-  week history of fatigue.  Her coronary risk factors are significant for  postmenopausal status x1 year, hypertension, and diabetes.   REVIEW OF SYSTEMS:  She has had no fevers, chills, cough, nausea and  vomiting, or diarrhea.  She has had no peripheral edema, no orthopnea.   PAST MEDICAL HISTORY:  1. Adult onset diabetes.  2. Hypertension.  3. Palpitations.  4. Peptic ulcer disease.  5. Attention-deficit disorder.  6. Osteoarthritis.  7. Seasonal allergies.   MEDICATIONS AT HOME:  1. Glucophage.  2. Altace 2.5 mg daily.  3. Hydrochlorothiazide 25 mg daily.  4. Allegra 180 mg daily.  5. Arthrotec daily.  6. Methylphenidate 15 mg t.i.d.   INPATIENT  MEDICATIONS:  1. Toprol 25 mg daily x1 dose.  2. Lopressor 12.5 mg b.i.d.  3. Aspirin 325 mg daily.  4. Altace 2.5 mg daily.  5. Hydrochlorothiazide.  6. Methylphenidate.  7. Protonix 40 mg daily.  8. Glucophage placed on hold.   ALLERGIES:  No known drug allergies.   FAMILY HISTORY:  Father is unknown.  Mother has no significant medical  history.   SOCIAL HISTORY:  No history of tobacco, alcohol, or illicit drug use.  She  is married.  She has 15 adopted children.  She has one child who is 24 years  of age and is ready to give birth.   PHYSICAL EXAMINATION:  GENERAL:  A middle-aged African American female in no  distress.  She has had no further chest pain since admission.  VITAL SIGNS:  Blood pressure is 120/60, heart  rate is in the 60s.  She is  afebrile.  NEUROLOGIC:  She is alert, oriented, conversation is appropriate.  No focal  deficits.  HEENT:  Unremarkable.  No neck vein distention.  PULMONARY:  Exam reveals breath sounds which are equal and clear to  auscultation.  No use of accessory muscles.  O2 saturation is 99%.  CARDIOVASCULAR:  Exam reveals a normal S1, normal S2.  Regular rate and  rhythm.  No rubs, murmurs, or gallops noted.  ABDOMEN:  Soft, benign, nontender.  No unusual bruits or pulsations.  EXTREMITIES:  No peripheral edema.  Femoral pulses 1+ bilaterally.   LABORATORY DATA:  Serial cardiac enzymes had been negative.  Potassium is  4.4.  Hemoglobin 12.9.  INR of 0.9.   Chest x-ray reveals no acute disease.  ECG reveals a normal sinus rhythm,  delayed R-wave progression, nonspecific ST changes.   IMPRESSION:  1. Chest pain, somewhat atypical for cardiac; however, the patient does have     risk factors including hypertension, diabetes, and a sedentary lifestyle.     We will proceed with a stress Cardiolite for further evaluation.  2. Hypertension.  Blood pressure is adequately controlled on Altace and     Lopressor.  3. Palpitations.  She has had no  arrhythmias since her admission.                                               Meade Maw, M.D.    HP/MEDQ  D:  04/29/2004  T:  04/29/2004  Job:  [id number]

## 2011-03-06 NOTE — Discharge Summary (Signed)
NAMEMARITSA, HUNSUCKER                            ACCOUNT NO.:  [id number]   MEDICAL RECORD NO.:  [id number]                   PATIENT TYPE:  INP   LOCATION:  3703                                 FACILITY:  [facility]   PHYSICIAN:  Adrian Blackwater, MD            DATE OF BIRTH:  18-Jul-1951   DATE OF ADMISSION:  04/28/2004  DATE OF DISCHARGE:  04/30/2004                                 DISCHARGE SUMMARY   [name] was admitted on April 28, 2004 with diagnosis of atypical  chest pain.  She was discharged on July 13 with diagnosis of non-cardiac  chest pain, possible GI related.   DISCHARGE MEDICATIONS:  The patient was discharged with the following  medications:  1. Zocor 40 mg tablet at bedtime.  2. Altace 10 mg q.d.  3. Lopressor 12.5 mg p.o. q.d.  4. Hydrochlorothiazide 25 mg p.o. q.d.  5. Methylphenidate two tablets a.m. and one tablet at noon.  6. Protonix 40 mg p.o. q.d.  7. Glucophage 500 mg two tablets a.m. and one tablet p.m.   The patient was admitted with atypical chest pain and during hospital course  she was stable; no chest pain, no shortness of breath, light occasional  occipital headache, no palpitations, and the physical exam was completely  normal.   PROCEDURE:  None.   On July 11, chest x-ray two view was normal. Mediastinum unremarkable. Lungs  were clear. Vascularity was normal. Ordinary degenerative change of the  spine. On July 11, cardiac enzymes x3 were normal. An ECG was normal. On  July 13, a stress Cardiolite shows no ischemic change, ejection fraction  60%. A fasting lipid profile shows LDL 116 and also hemoglobin ____________  was 7.   During the hospital course, basic metabolic panel was normal and CBC was  normal. Dr. Fraser Din was consulted, cardiologist, and patient was discharged  with diagnosis of hypertension under control, diabetes mellitus type 2,  obesity, hyperlipidemia, and chest pain, not cardiac related.                          Adrian Blackwater, MD    IM/MEDQ  D:  04/30/2004  T:  05/01/2004  Job:  [id number]   cc:   [name], M.D.  Fax: [phone]

## 2011-03-06 NOTE — Discharge Summary (Signed)
[name], [name]                  ACCOUNT NO.:  [id number]   MEDICAL RECORD NO.:  [id number]          PATIENT TYPE:  INP   LOCATION:  5008                         FACILITY:  [facility]   PHYSICIAN:  Robert A. Thurston Hole, M.D. DATE OF BIRTH:  08-25-1951   DATE OF ADMISSION:  11/17/2004  DATE OF DISCHARGE:  11/21/2004                                 DISCHARGE SUMMARY   ADMISSION DIAGNOSES:  1.  End-stage degenerative joint disease, right knee.  2.  Hypertension.  3.  Diabetes.  4.  Attention deficit disorder.   DISCHARGE DIAGNOSES:  1.  End-stage degenerative joint disease, right knee, status post total knee      replacement.  2.  Hypertension.  3.  Diabetes.  4.  Attention deficit disorder.  5.  Postoperative blood loss anemia.  6.  Gastrointestinal reflux.  7.  Urinary incontinence.  8.  Tachycardia.   HISTORY OF PRESENT ILLNESS:  The patient is a 60 year old black female with  a history of hypertension, diabetes and ADD, who has end-stage DJD of her  right knee.  She has tried conservative care, including anti-inflammatories  and cortisone injections, all without success.  She understands the risks,  benefits and possible complications of a total knee replacement and is  without question.   PROCEDURES IN HOUSE:  On November 17, 2004, the patient underwent a right  total knee replacement by Dr. Thurston Hole and a femoral nerve block by  anesthesia.  She tolerated both procedures well.  On postoperative day #1,  she was doing well without complication with moderate pain.  Her hemoglobin  was 8.8.  Her diabetes was under control.  The diabetes treatment was  consulted to help manage her diabetes on postoperative day #2.  On  postoperative day #1, her PCA was discontinued.  She was placed on Percocet  for pain.  Her Percocet was increased to 7.5/325 mg two tablets q.6h.  Her  Glucophage was restarted on postoperative day #2.  The patient had  difficulty with hyponatremia with a sodium of  125.  Her hydrochlorothiazide  was discontinued.  She was given metoprolol 2.5 mg IV now and at 10 a.m.  She was started on metoprolol 25 mg b.i.d.  A dressing change was ordered.  Her Foley was discontinued.  Home  health was ordered.  A Dulcolax  suppository was ordered.  Medicine did a febrile workup for her temperature  of 100.9 degrees by ordering a chest x-ray, a UA and then worked up her  hyponatremia.  On postoperative day #3, she was transfused two units of  packed red blood cells with 20 mg of Lasix between units.  Her metoprolol  was held.  A rehabilitation consult was ordered.  Transfusion was stopped  after 1-2/3 units due to fever.  On postoperative day #5, her dressing was  changed, her IV was changed and she was discharged to home in stable  condition.  Her hemoglobin was 10.1 after transfusion.  Her INR was 2.4 on  the day of discharge.  Her sodium was 135.  Her glucose was 123.  DISPOSITION:  She was discharged to home in stable condition.   ACTIVITY:  Weightbearing as tolerated.   DIET:  Regular diabetic diet.   DISCHARGE MEDICATIONS:  1.  Percocet 5/325 mg one to two q.4-6h. p.r.n. pain.  2.  Robaxin 500 mg one q.4-6h. p.r.n. muscle spasm.  3.  Coumadin 5 mg one p.o. daily.  4.  Metoprolol 25 mg one p.o. b.i.d.  5.  Allegra 150 mg one p.o. b.i.d.  6.  Glucophage 500 mg two tablets q.a.m. and one tablet q.p.m.  7.  Ritalin 10 mg one-and-a-half tablets t.i.d.  8.  Zocor 40 mg one tablet q.h.s.  9.  Colace 100 mg one p.o. b.i.d.  10. Senokot-S two tablets before dinner.   She has been instructed not to take her hydrochlorothiazide, not to take her  Altace and not to take her Vicodin or any anti-inflammatory drugs due to her  Coumadin.   WOUND CARE:  She has been instructed to change her dressing daily and call  with increased pain, increased swelling, increased redness or a temperature  greater than 101 degrees.   FOLLOWUP:  She will follow up with Dr. Thurston Hole  on December 01, 2004, and  follow up with Dr. McDiarmid at family practice.       KS/MEDQ  D:  02/25/2005  T:  02/25/2005  Job:  [id number]

## 2011-03-06 NOTE — Consult Note (Signed)
NAMEMAELY, [name]                  ACCOUNT NO.:  [id number]   MEDICAL RECORD NO.:  [id number]          [name] TYPE:  INP   LOCATION:  5008                         FACILITY:  [facility]   PHYSICIAN:  Broadus John T. [name], MDDATE OF BIRTH:  [birth year]   DATE OF CONSULTATION:  11/18/2004  DATE OF DISCHARGE:                                   CONSULTATION   PRIMARY CARE PHYSICIAN:  [name], M.D.   REASON FOR CONSULTATION:  [name] is status post right total knee  arthroplasty with elevated CBGs.  Consulted for management of medical  issues.   DISCHARGE DIAGNOSES:  1.  Diabetes mellitus type 2.  2.  Hypertension.  3.  Gastroesophageal reflux disease.  4.  Peptic ulcer disease.  5.  Urge incontinence.  6.  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   PROCEDURES:  1.  Right total knee arthroplasty performed November 18, 2004.  2.  Chest x-ray performed prior to surgery showed Barbara stable exam.  No active      cardiopulmonary process.  Barbara repeat chest x-ray performed November 19, 2004 shows no significant change.   LABORATORY DATA:  Admission white count of 7.5, hemoglobin 9.0, hematocrit  25.8.  [name] had Barbara sodium of 125.  Serum osmolality of 286.  Urine  osmolality of 319.  Urinary creatinine of 99.9.  Urine sodium of 126.  Calculated FENa of 0.1%.  Barbara urinalysis significant for trace ketones and 500  of glucose.  Repeat sodium after holding Altace and hydrochlorothiazide was  131, and on Barbara day of discharge was 133.  __________ day of discharge on  November 20, 2004.   HISTORY AND PHYSICAL:  Please see Barbara consult note on Barbara chart, but in  short, [name] is [name] who was admitted  for Barbara right total knee arthroplasty, and we were consulted to manage her  medical issues.   HOSPITAL COURSE:  1.  Diabetes mellitus type 2.  Restarted her metformin 1000 in Barbara  morning,      and 500 in Barbara evening.  [name]'s blood sugar after starting      metformin ranged from 178 to 208.  In Barbara face of that, [name] was      started on glyburide 2.5 p.o. b.i.d., and blood sugars on November 20, 2004 were 128-149.  I would appreciate if Dr. McDiarmid in follow up      would potentially discontinue Barbara glyburide once Barbara metformin has Barbara      chance to take affect.  2.  Hypertension.  [name] was restarted on her home medications for      hypertension which include Altace 10 mg p.o. daily, hydrochlorothiazide      25 mg p.o. daily.  Blood pressures after having started Barbara medications      ranged from 151-169/64-79.  Furthermore, [name]'s sodium was 125.      Barbara hydrochlorothiazide and  Altace were held secondary to presumed      hyponatremia, and [name] was started on metoprolol 25 mg p.o.      b.i.d.  [name]'s blood pressures on Barbara metoprolol 25 mg b.i.d.      were 100-135/55-81.  Would appreciate in follow up if Dr. McDiarmid      would evaluate [name]'s blood pressures and resume Barbara medications      he deems appropriate.  3.  Hyponatremia.  [name]'s sodium was 125 on November 19, 2004.  This      is after having held Barbara hydrochlorothiazide.  In addition, Barbara Altace      was then held.  [name]'s sodium was 131, and then 133 on Barbara      subsequent days.  See Barbara labs listed above, but Barbara serum osmolality      was 286, urine osmolality was 319, and Barbara FENa was 0.1%.  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      Although Barbara sodium is not concerning enough for any further      intervention at this time, will have [name] follow up in 1 week      with Dr. McDiarmid, and at that time we would appreciate if he would      recheck Barbara sodium level, and then resume Barbara medications he deems      appropriate.  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  Chest x-ray was negative in Barbara      hospital for any active __________ disease which could account for      SIADH.   DISCHARGE MEDICATIONS:  [name] was discharged home on her previous  medicines including -  1.  Allegra.  2.  Aspirin.  3.  Flonase.  4.  Ritalin.  5.  Simvastatin.  However, she was also discharged home on -  1.  Glucophage XL 500 mg two tablets q.Barbara.m., one tablet q.p.m.  2.  [name] was started on Glyburide 2.5 mg p.o. b.i.d.  Would      appreciate it if Dr. McDiarmid would discontinue this medicine if he      thinks if it is necessary.  3.  [name] was instructed to hold taking her Altace and      hydrochlorothiazide for Barbara present time, and to continue taking Barbara      metoprolol 25 mg p.o. b.i.d.  We would appreciate if Dr. McDiarmid would      evaluate and determine whether or not he feels that this is necessary,      and maybe resume Barbara ACE inhibitor as an outpatient.   FOLLOW UP INSTRUCTIONS:  [name] is instructed to follow up on November 27, 2004 at 3:30 in Barbara afternoon with Dr. McDiarmid to discuss Barbara issues  dictated above.      WTP/MEDQ  D:  11/20/2004  T:  11/20/2004  Job:  [id number]   cc:   [name], M.D.  Fax: [phone]

## 2011-03-31 ENCOUNTER — Telehealth: Payer: Self-pay | Admitting: Family Medicine

## 2011-03-31 NOTE — Telephone Encounter (Signed)
Called patient and gave her message from Dr. Jennette Kettle and patient is fine with this recommendation . She really doesn't want to take antibiotic . She has recently finished a course of antibiotics for an ear infection and would rather not take anymore now.

## 2011-03-31 NOTE — Telephone Encounter (Signed)
Please call [name] to let her know if she will still need to have an antibiotic prescribed before her dental appt tomorrow.  It's been six yrs since last surgery for her knee, but the dentist need to know if she will need this for their appt.  Please call her today to let her know.  The dental office will call it in if she does.

## 2011-03-31 NOTE — Telephone Encounter (Signed)
[name] For knee replacement new guidelines do not require prophylaxis--BUT if she is uncomfortable with this (because she is used to having it etc) i am perfectly comfortable callin in an abx THANKS! Denny Levy

## 2011-03-31 NOTE — Telephone Encounter (Signed)
Dr. Perley Jain is not available this afternoon. Will send message to Dr. Jennette Kettle preceptor.

## 2011-05-14 ENCOUNTER — Encounter: Payer: Self-pay | Admitting: Family Medicine

## 2011-05-14 ENCOUNTER — Ambulatory Visit (INDEPENDENT_AMBULATORY_CARE_PROVIDER_SITE_OTHER): Payer: Medicare Other | Admitting: Family Medicine

## 2011-05-14 VITALS — BP 140/82 | HR 134 | Temp 98.4°F | Wt 200.1 lb

## 2011-05-14 DIAGNOSIS — K0889 Other specified disorders of teeth and supporting structures: Secondary | ICD-10-CM | POA: Insufficient documentation

## 2011-05-14 DIAGNOSIS — I10 Essential (primary) hypertension: Secondary | ICD-10-CM

## 2011-05-14 DIAGNOSIS — K089 Disorder of teeth and supporting structures, unspecified: Secondary | ICD-10-CM

## 2011-05-14 MED ORDER — ACETAMINOPHEN 325 MG PO TABS: 650.0000 mg | ORAL_TABLET | Freq: Three times a day (TID) | ORAL | Status: AC

## 2011-05-14 NOTE — Progress Notes (Signed)
  Subjective:    Patient ID: [name], female    DOB: 1951/10/01, 60 y.o.   MRN: [id number]  HPI Right tooth pain. Started earlier this month. Got root canal done July 11th. Pain has been worsening since then. Constant.  Has been taking up to 4 of her Norco (given by PCP for spinal stenosis) a day. Helps pain for about 10-15 minutes.  Has not seen the dentist since surgery; next appointment is in September. Chewing on that side makes the pain significantly worse. Has had decreased oral intake but has been drinking appropriately.   Review of Systems Denies fevers, chills, nausea/vomiting.  Occasional sweats.     Objective:   Physical Exam General: uncomfortable appearing Face: no NAD or noticeable swelling on right-side of face Mouth: right molar cavity without any lesions (including erythema) on gums on that side. TTP. No drainage of pus. CV: tachycardic    Assessment & Plan:

## 2011-05-14 NOTE — Patient Instructions (Signed)
Please schedule an appointment to see your dentist as soon as possible.  Take Tylenol 325mg  every 8 hours regularly and then the Norco every 8 hours as needed for breakthrough.   If you have fevers (temperatures >100.4) and start noticing worsening swelling and drainage in that area, you may have an infection. I would recommend seeing your dentist for this. If that is difficult, please come into the clinic or go the ED.

## 2011-05-14 NOTE — Assessment & Plan Note (Signed)
Of right molars preceding root canal and significantly worse afterwards. Recommend that patient call dentist right away for this issue. I do not see anything significant on physical exam and patient presents with no systemic signs of infection. Patient given red flags as possible signs of infection to watch out for. Offered oxycodone for pain but patient does not want. Will try Tylenol scheduled with Norco for breakthrough for pain for now. Also recommended cool compresses.

## 2011-05-16 ENCOUNTER — Encounter: Payer: Self-pay | Admitting: Family Medicine

## 2011-05-16 ENCOUNTER — Emergency Department (HOSPITAL_COMMUNITY)
Admission: EM | Admit: 2011-05-16 | Discharge: 2011-05-16 | Disposition: A | Discharge status: Home / Self Care | Payer: Medicare Other | Attending: Emergency Medicine | Admitting: Emergency Medicine

## 2011-05-16 DIAGNOSIS — Z79899 Other long term (current) drug therapy: Secondary | ICD-10-CM | POA: Insufficient documentation

## 2011-05-16 DIAGNOSIS — E789 Disorder of lipoprotein metabolism, unspecified: Secondary | ICD-10-CM | POA: Insufficient documentation

## 2011-05-16 DIAGNOSIS — F909 Attention-deficit hyperactivity disorder, unspecified type: Secondary | ICD-10-CM | POA: Insufficient documentation

## 2011-05-16 DIAGNOSIS — I10 Essential (primary) hypertension: Secondary | ICD-10-CM | POA: Insufficient documentation

## 2011-05-16 DIAGNOSIS — K089 Disorder of teeth and supporting structures, unspecified: Secondary | ICD-10-CM | POA: Insufficient documentation

## 2011-05-18 ENCOUNTER — Telehealth: Payer: Self-pay | Admitting: Family Medicine

## 2011-05-18 NOTE — Telephone Encounter (Signed)
Will forward to MD to see if he has any suggestions, otherwise patient will have to call around to see where she can get in fastest.

## 2011-05-18 NOTE — Telephone Encounter (Signed)
Appointment made with oral surgeon, Royston Sinner, DDS for 05/19/11 at 2:45 pm.  Pt to continue antibiotics and opiate pain medication.

## 2011-05-18 NOTE — Telephone Encounter (Signed)
Ms. Loiselle called and had just spoken to Dr. Perley Jain.  The Dentist he referred her to will not be able to see her for several more weeks and she cannot wait that long.  Is there anyone else she can see?

## 2011-05-25 ENCOUNTER — Encounter: Payer: Self-pay | Admitting: Family Medicine

## 2011-05-25 ENCOUNTER — Ambulatory Visit (INDEPENDENT_AMBULATORY_CARE_PROVIDER_SITE_OTHER): Payer: Medicare Other | Admitting: Family Medicine

## 2011-05-25 VITALS — BP 150/70 | HR 128 | Temp 98.3°F | Wt 199.0 lb

## 2011-05-25 DIAGNOSIS — E119 Type 2 diabetes mellitus without complications: Secondary | ICD-10-CM

## 2011-05-25 DIAGNOSIS — R202 Paresthesia of skin: Secondary | ICD-10-CM

## 2011-05-25 DIAGNOSIS — I10 Essential (primary) hypertension: Secondary | ICD-10-CM

## 2011-05-25 DIAGNOSIS — R209 Unspecified disturbances of skin sensation: Secondary | ICD-10-CM

## 2011-05-25 DIAGNOSIS — Z79899 Other long term (current) drug therapy: Secondary | ICD-10-CM

## 2011-05-25 DIAGNOSIS — Z8711 Personal history of peptic ulcer disease: Secondary | ICD-10-CM

## 2011-05-25 LAB — COMPREHENSIVE METABOLIC PANEL
Albumin: 4.3 g/dL (ref 3.5–5.2)
CO2: 23 mEq/L (ref 19–32)
Calcium: 9.6 mg/dL (ref 8.4–10.5)
Glucose, Bld: 451 mg/dL — ABNORMAL HIGH (ref 70–99)
Potassium: 4.2 mEq/L (ref 3.5–5.3)
Sodium: 131 mEq/L — ABNORMAL LOW (ref 135–145)
Total Protein: 7.5 g/dL (ref 6.0–8.3)

## 2011-05-25 MED ORDER — GLIPIZIDE ER 10 MG PO TB24: 10.0000 mg | ORAL_TABLET | Freq: Every day | ORAL | Status: DC

## 2011-05-25 MED ORDER — CARVEDILOL 6.25 MG PO TABS: 6.2500 mg | ORAL_TABLET | Freq: Two times a day (BID) | ORAL | Status: DC

## 2011-05-25 NOTE — Patient Instructions (Signed)
Diabetes Your A1c is greater than 14%.  This is very high. Please start Glucotrol XL 10 mg once daily. Take 30 minutes before breakfast. Increase your Metformin to 4 tablets daily.   Start checking your morning fasting blood sugars.  Please write the blood sugars down and bring with you to your next office visit with Dr McDiarmid.    Hypertension Start Carvedilol, a Beta-blocker antihypertensive medication.  Take one tablet twice a day.  It will help lower your blood pressure and slow your heart rate into a normal range.

## 2011-05-26 ENCOUNTER — Telehealth: Payer: Self-pay | Admitting: Family Medicine

## 2011-05-26 ENCOUNTER — Encounter: Payer: Self-pay | Admitting: Family Medicine

## 2011-05-26 DIAGNOSIS — R202 Paresthesia of skin: Secondary | ICD-10-CM | POA: Insufficient documentation

## 2011-05-26 MED ORDER — GLUCOSE BLOOD VI STRP: ORAL_STRIP | Status: DC

## 2011-05-26 NOTE — Assessment & Plan Note (Signed)
Inadequate BP control continues.  Pt has lost 5 pounds since last OV in [place].   Plan: Start Carvedilol 6.25 mg Twice a day by mouth to help with BP and tachycardia.  Continue Lotrel 10/20 daily and HCTZ 25 mg daily. Lab Results  Component Value Date   CREATININE 0.83 05/25/2011   BUN 15 05/25/2011   NA 131* 05/25/2011   K 4.2 05/25/2011   CL 93* 05/25/2011   CO2 23 05/25/2011  Glucose 451 mg/dL RTC 3 weeks to assess response,

## 2011-05-26 NOTE — Progress Notes (Signed)
  Subjective:    Patient ID: [name], female    DOB: [birth year], 60 y.o.   MRN: [id number]  HPI  CHRONIC DIABETES  Disease Monitoring  Blood Sugar Ranges: not checking at home, ran out of strips. Polyuria: yes and polydipsia  Visual problems: bilateral blurry vision Medication Compliance: yes, metformin 1500mg  daily  Medication Side Effects: no gi upset Hypoglycemia: no  Preventitive Health Care  Diet pattern: eating more fat in diet.  Exercise: not taking formal exercise Recent failed root canal that required urgent tooth extraction.  Pt reports extracting dentist (Dr Royston Sinner) noted significant infection about extracted tooth. She was not told there was infection in the bone.  Still with some gum pain in extraction site but much improved. [place] vicodin 1 tab twice a day currently, mostly for knee and hip pain than oral pain.   CHRONIC HYPERTENSION  Disease Monitoring  Blood pressure range: not checking at home, but in 150-160 SBP range at dentist's office  Chest pain: no   Dyspnea: no   Claudication: no   Medication compliance: yes  Medication Side Effects  Lightheadedness: yes   Urinary frequency: yes   Edema: no   Salt Restriction: yes.  Tingling in right hand Onset several months ago, intermittent, more common first thing in morning Mostly involves middle and ring finger. No weakness in hand.  No sxs in right hand.  No pain. Mother with bilateral carpel tunnel syndrome that required surgical release.      Medications, past medical history,  family history, social history were reviewed and updated.  Review of Systems  Constitutional: Negative for fever, chills and activity change.  HENT: Negative for facial swelling and neck pain.   Respiratory: Negative for shortness of breath.   Genitourinary: Positive for frequency. Negative for urgency and hematuria.  Musculoskeletal: Positive for back pain.  Neurological: Negative for dizziness, syncope and  headaches.  See HPI     Objective:   Physical Exam  Constitutional: She appears well-developed.       obese  HENT:  Right Ear: External ear normal.  Left Ear: External ear normal.  Mouth/Throat: No oropharyngeal exudate.  Eyes: Conjunctivae are normal. Pupils are equal, round, and reactive to light. No scleral icterus.  Neck: Normal range of motion. No thyromegaly present.       < 1cm right tonsilar node  Cardiovascular: Normal heart sounds and intact distal pulses.  Exam reveals no gallop.   No murmur heard.      Tachycardic, normal RRR  Pulmonary/Chest: Effort normal and breath sounds normal.  Abdominal: Soft. Bowel sounds are normal.  Musculoskeletal: She exhibits no edema.       Arms: Neurological: She is alert.  Psychiatric: She has a normal mood and affect. Her behavior is normal. Thought content normal.          Assessment & Plan:

## 2011-05-26 NOTE — Assessment & Plan Note (Signed)
Dramatic worsening of A1C since April.  It may be that this dental infection could have contributed to this loss of glycemic control. With resolution of the infection with the dental extraction, my hope is that the counterregulatory effect from inflammation will resolve and insulin therapy would not then be as indicated as it is based on just the level of A1C. Lab Results  Component Value Date   HGBA1C >14.0 05/25/2011   Lab Results  Component Value Date   NA 131* 05/25/2011   K 4.2 05/25/2011   CL 93* 05/25/2011   CO2 23 05/25/2011   Lab Results  Component Value Date   CREATININE 0.83 05/25/2011   Lab Results  Component Value Date   HGBA1C >14.0 05/25/2011   . Lab Results  Component Value Date   CREATININE 0.83 05/25/2011   BUN 15 05/25/2011   NA 131* 05/25/2011   K 4.2 05/25/2011   CL 93* 05/25/2011   CO2 23 05/25/2011  Serum Glucose 451 mg /dL  Plan: Start Glucotrol XL 10 mg daily.  Increase Metformin to 2000 mg daily.  Push per oral fluid. RTC 3 weeks.

## 2011-05-26 NOTE — Telephone Encounter (Signed)
Needs script for her test strips for her One Touch Ultra blue for her insurance to pay for it. [facility] [address] [phone]

## 2011-05-26 NOTE — Assessment & Plan Note (Signed)
(+)   Phalen left hand with paresthetias in 2nd and third digits C/W carpel tunnel syndrome.  Trial of Cock up splint worn when exacerbations.

## 2011-05-26 NOTE — Telephone Encounter (Signed)
Patient husband informed.

## 2011-05-26 NOTE — Telephone Encounter (Signed)
Please let patient know that CBG test strips prescription was sent to her pharmacy, [facility] on [address].

## 2011-06-01 ENCOUNTER — Telehealth: Payer: Self-pay | Admitting: Family Medicine

## 2011-06-01 DIAGNOSIS — I10 Essential (primary) hypertension: Secondary | ICD-10-CM

## 2011-06-01 NOTE — Telephone Encounter (Signed)
Need refill for 71m supply for Lipitor and 25m supply for Lotrel to take to [facility] for rx

## 2011-06-02 MED ORDER — ATORVASTATIN CALCIUM 40 MG PO TABS: 40.0000 mg | ORAL_TABLET | Freq: Every day | ORAL | Status: DC

## 2011-06-02 MED ORDER — AMLODIPINE BESY-BENAZEPRIL HCL 10-20 MG PO CAPS: 1.0000 | ORAL_CAPSULE | Freq: Every day | ORAL | Status: DC

## 2011-06-02 NOTE — Telephone Encounter (Signed)
Please let patient know prescriptions are available for pick up at [facility] front desk.

## 2011-06-02 NOTE — Telephone Encounter (Signed)
Patient informed. 

## 2011-06-15 ENCOUNTER — Ambulatory Visit (INDEPENDENT_AMBULATORY_CARE_PROVIDER_SITE_OTHER): Payer: Medicare Other | Admitting: Family Medicine

## 2011-06-15 ENCOUNTER — Encounter: Payer: Self-pay | Admitting: Family Medicine

## 2011-06-15 VITALS — BP 149/77 | HR 106 | Temp 98.1°F | Wt 204.9 lb

## 2011-06-15 DIAGNOSIS — I10 Essential (primary) hypertension: Secondary | ICD-10-CM

## 2011-06-15 DIAGNOSIS — E119 Type 2 diabetes mellitus without complications: Secondary | ICD-10-CM

## 2011-06-15 MED ORDER — CARVEDILOL 12.5 MG PO TABS: 12.5000 mg | ORAL_TABLET | Freq: Two times a day (BID) | ORAL | Status: DC

## 2011-06-15 NOTE — Patient Instructions (Signed)
Increase your carvedilol 6.25 mg tablet from one tablet twice to two tablets twice a day to imporve blood pressure control and lower your fast heart rate.  Continue your Metformin 4 tablets daily and the Glipiiide 10 mg daily.   Let Dr Perley Jain when your blood sugar stays lower than 100 consistently over several days.

## 2011-06-16 ENCOUNTER — Encounter: Payer: Self-pay | Admitting: Family Medicine

## 2011-06-16 NOTE — Assessment & Plan Note (Signed)
Improved BP contol, but still not at goal  Heart rate > 100.  Tolerating the new med, carvedilol. Will increase to 12.5 mg twice a day.  RTC in 2 months for BP check.  No evidence of new end organ damage.  Continue Lotrel and HCTZ 9 ( at max doses).

## 2011-06-16 NOTE — Assessment & Plan Note (Signed)
Improved CBGs by home diary. Tolerating the new diabetic med, Glipizide, and the increase in metformin to 2000 mg daily.  No hypoglycemia.  Plan to continue current medication regiment.  Reintroduce exercise.  Pt working on diet changes.   She believes that she should be able to get her blood sugars back to the usual good control, especially with resolution of dental infection.  When she is consistently averaging fasting CGB below 100, pt will contact us about reducing or eliminating the Glipizide.  RTC in 2 month.  Will check A1C at that time.

## 2011-06-16 NOTE — Progress Notes (Signed)
  Subjective:    Patient ID: PIER LAUX, female    DOB: 04/08/1951, 60 y.o.   MRN: [id number]  HPI CHRONIC DIABETES  Disease Monitoring  Blood Sugar Ranges: Review of glucometer memory showed supramajority of fasting CBG 2 days after starting Glipizide were less than 170 mg/dL.   Polyuria: no, resolved   Visual problems: no, resolved.   Medication Compliance: yes  Medication Side Effects  Hypoglycemia: no   Preventitive Health Care  Eye Exam: Went to her Ophthalmologist two weeks ago and did not have evidence of diabetic retinopathy per patient.  She has an early asymptomatic cataract in her right lens. 2  Foot Exam: no lesions  Diet pattern: watching fats, sweets, and simple carbohydrates. Watching sodium in diet.  Exercise: is starting back on walking daily.  CHRONIC HYPERTENSION  Disease Monitoring  Blood pressure range: not checking at home  Chest pain: no   Dyspnea: no   Claudication: no   Medication compliance: yes Started carvedilol 6.25 mg bid. Medication Side Effects  Lightheadedness: no   Urinary frequency: no   Edema: no  Medications, past medical history,  family history, social history were reviewed and updated.   Review of Systems See HPI     Objective:   Physical Exam  Nursing note and vitals reviewed. Constitutional:       Obese  Neck: No JVD present.  Cardiovascular: Normal rate, regular rhythm, normal heart sounds and intact distal pulses.   No murmur heard. Pulmonary/Chest: Effort normal and breath sounds normal.  Neurological:       Feet without lesions.             Assessment & Plan:

## 2011-08-16 ENCOUNTER — Other Ambulatory Visit: Payer: Self-pay | Admitting: Family Medicine

## 2011-08-17 NOTE — Telephone Encounter (Signed)
Refill request

## 2011-08-18 ENCOUNTER — Telehealth: Payer: Self-pay | Admitting: Family Medicine

## 2011-08-18 NOTE — Telephone Encounter (Signed)
Needs orders for diagnostic mammogram to send to [facility] - pls call when ready for her to pick up

## 2011-08-19 NOTE — Telephone Encounter (Signed)
Please let [name] know her mammogram order is available for pick-up at [facility] front desk.

## 2011-08-19 NOTE — Telephone Encounter (Signed)
Pt informed. Fleeger, Jessica Dawn  

## 2011-08-27 ENCOUNTER — Telehealth: Payer: Self-pay | Admitting: Family Medicine

## 2011-08-27 NOTE — Telephone Encounter (Signed)
Pt given orders for a mammogram but the order has to say diagnostic & not screening, pt needs hard copy and for it to be faxed to Attn: Ms. Lianne Cure [phone]

## 2011-08-27 NOTE — Telephone Encounter (Signed)
Will forward to MD.  

## 2011-08-28 NOTE — Telephone Encounter (Signed)
Pt called and informed Sharmane Dame, [name]

## 2011-08-28 NOTE — Telephone Encounter (Signed)
Please let [name] know that the order for diagnostic mammogram has been fax'd to [facility] and that the hard copy of the order has been mailed to [name]. [name] D

## 2011-09-01 ENCOUNTER — Other Ambulatory Visit: Payer: Self-pay | Admitting: Family Medicine

## 2011-09-01 DIAGNOSIS — F988 Other specified behavioral and emotional disorders with onset usually occurring in childhood and adolescence: Secondary | ICD-10-CM

## 2011-09-01 DIAGNOSIS — J309 Allergic rhinitis, unspecified: Secondary | ICD-10-CM

## 2011-09-01 NOTE — Telephone Encounter (Signed)
Patient has appt on 11/29, asking for rx for ritalin and flonase, will need before appt.

## 2011-09-02 ENCOUNTER — Telehealth: Payer: Self-pay | Admitting: Family Medicine

## 2011-09-02 NOTE — Telephone Encounter (Signed)
Need letter excusing from jury duty due to meds

## 2011-09-04 ENCOUNTER — Encounter: Payer: Self-pay | Admitting: *Deleted

## 2011-09-04 ENCOUNTER — Encounter: Payer: Self-pay | Admitting: Family Medicine

## 2011-09-04 MED ORDER — METHYLPHENIDATE HCL 10 MG PO TABS: 20.0000 mg | ORAL_TABLET | Freq: Three times a day (TID) | ORAL | Status: DC

## 2011-09-04 MED ORDER — FLUTICASONE PROPIONATE 50 MCG/ACT NA SUSP: NASAL | Status: DC

## 2011-09-04 NOTE — Telephone Encounter (Signed)
Please let patient know her prescriptions are available for pick up from [facility] front desk.

## 2011-09-04 NOTE — Telephone Encounter (Signed)
Patient informed. 

## 2011-09-07 ENCOUNTER — Other Ambulatory Visit: Payer: Self-pay | Admitting: Family Medicine

## 2011-09-07 NOTE — Telephone Encounter (Signed)
Refill request

## 2011-09-08 ENCOUNTER — Telehealth: Payer: Self-pay | Admitting: Family Medicine

## 2011-09-08 NOTE — Telephone Encounter (Signed)
Left message with husband to have pt return call, letter ready at front desk for p/u.

## 2011-09-08 NOTE — Telephone Encounter (Signed)
Pt was needing a note for jury duty but the one mailed to her does not have Dr. Mellody Drown signature on it, is asking if we can reprint the letter and have him sign it asap & call her when its ready for pick up.

## 2011-09-08 NOTE — Telephone Encounter (Signed)
To MD.  I can print if you will just come by and sign for me [name], [name]

## 2011-09-17 ENCOUNTER — Ambulatory Visit: Payer: Medicare Other | Admitting: Family Medicine

## 2011-09-21 ENCOUNTER — Encounter: Payer: Self-pay | Admitting: Family Medicine

## 2011-09-21 ENCOUNTER — Ambulatory Visit (INDEPENDENT_AMBULATORY_CARE_PROVIDER_SITE_OTHER): Payer: Medicare Other | Admitting: Family Medicine

## 2011-09-21 VITALS — BP 124/76 | HR 103 | Temp 98.7°F | Ht 61.0 in | Wt 216.0 lb

## 2011-09-21 DIAGNOSIS — E119 Type 2 diabetes mellitus without complications: Secondary | ICD-10-CM

## 2011-09-21 DIAGNOSIS — I10 Essential (primary) hypertension: Secondary | ICD-10-CM

## 2011-09-21 LAB — POCT GLYCOSYLATED HEMOGLOBIN (HGB A1C): Hemoglobin A1C: 7

## 2011-09-21 MED ORDER — METFORMIN HCL 500 MG PO TABS: 2000.0000 mg | ORAL_TABLET | Freq: Every day | ORAL | Status: DC

## 2011-09-21 MED ORDER — TRIAMCINOLONE ACETONIDE 0.1 % EX CREA: TOPICAL_CREAM | Freq: Two times a day (BID) | CUTANEOUS | Status: DC

## 2011-09-21 NOTE — Patient Instructions (Signed)
Decrease Carvedilol to half tablet (6.25mg ) twice a day for one week, then stop Stop glipizide.  Monitor blood sugars and pressures. Return in January.  A1C was 7 % today (down from over 14 % )

## 2011-09-22 ENCOUNTER — Encounter: Payer: Self-pay | Admitting: Family Medicine

## 2011-09-22 NOTE — Assessment & Plan Note (Addendum)
Adequate blood pressure control.  No evidence of new end organ damage. Carvedilol associated with shortness of breath and chest tightness per patient.  Plan to titrate off carvedilol- reduce to half-tablet (6.25mg ) twice a day for one week, then stop.  RTC in one month to assess BP without carvedilol. Pt to continue Lotrel and HCTZ.   Pt going to [facility].

## 2011-09-22 NOTE — Progress Notes (Signed)
  Subjective:    Patient ID: [name], female    DOB: Oct 30, 1950, 60 y.o.   MRN: [id number]  HPI CHRONIC DIABETES  Disease Monitoring  Blood Sugar Ranges: Patient reports most CBG's < 130 Polyuria: no  Visual problems: no Medication Compliance: yes- taking metformin, Glucotrol XL Medication Side Effects  Hypoglycemia: Has had low CBG with symptoms that self rescued.  Pt attributes to not liking to eat snacks in between meals.   Preventitive Health Care  Eye Exam: She has an early asymptomatic cataract in her right lens.   Foot Exam: no lesions  Diet pattern: watching fats, sweets, and simple carbohydrates. Watching sodium in diet.  Exercise: Starting to go to [employer]  CHRONIC HYPERTENSION  Disease Monitoring  Blood pressure range: not checking at home  Chest pain: no  Dyspnea: no  Claudication: no  Medication compliance: taking carvedilol 12.5 mg twice a day (increased dose), Lotrel, HCTZ Medication Side Effects  Lightheadedness: no  Urinary frequency: no Shortness of breath and chest tightness  Edema: no      Review of Systems See HPI     Objective:   Physical Exam  Constitutional: She appears well-nourished. No distress.  Cardiovascular: Normal rate, regular rhythm and normal heart sounds.   Pulmonary/Chest: Effort normal and breath sounds normal.  Musculoskeletal: She exhibits no edema.          Assessment & Plan:

## 2011-09-22 NOTE — Assessment & Plan Note (Signed)
Lab Results  Component Value Date   HGBA1C 7.0 09/21/2011  Adequate glycemic control. Tolerating medications.  No new end-organ damage.  Stop glipizide. Continue metformin at 2000mg  daily. RTC in one month to see if glycemic control continues off glipizide.  If control continues, will consider reduction in metformin dose.

## 2011-10-08 ENCOUNTER — Other Ambulatory Visit: Payer: Self-pay | Admitting: Family Medicine

## 2011-10-08 MED ORDER — GLUCOSE BLOOD VI STRP: ORAL_STRIP | Status: DC

## 2011-10-21 ENCOUNTER — Other Ambulatory Visit: Payer: Self-pay | Admitting: Family Medicine

## 2011-10-21 MED ORDER — METHOCARBAMOL 750 MG PO TABS: 750.0000 mg | ORAL_TABLET | Freq: Four times a day (QID) | ORAL | Status: DC

## 2011-11-09 ENCOUNTER — Ambulatory Visit: Payer: Medicare Other | Admitting: Family Medicine

## 2011-11-23 ENCOUNTER — Encounter: Payer: Self-pay | Admitting: Family Medicine

## 2011-11-23 ENCOUNTER — Ambulatory Visit (INDEPENDENT_AMBULATORY_CARE_PROVIDER_SITE_OTHER): Payer: Medicare Other | Admitting: Family Medicine

## 2011-11-23 VITALS — BP 146/72 | HR 123 | Temp 98.3°F | Ht 61.0 in | Wt 217.0 lb

## 2011-11-23 DIAGNOSIS — E7800 Pure hypercholesterolemia, unspecified: Secondary | ICD-10-CM | POA: Diagnosis not present

## 2011-11-23 DIAGNOSIS — I10 Essential (primary) hypertension: Secondary | ICD-10-CM | POA: Diagnosis not present

## 2011-11-23 DIAGNOSIS — Z1211 Encounter for screening for malignant neoplasm of colon: Secondary | ICD-10-CM | POA: Diagnosis not present

## 2011-11-23 DIAGNOSIS — Z0000 Encounter for general adult medical examination without abnormal findings: Secondary | ICD-10-CM | POA: Diagnosis not present

## 2011-11-23 DIAGNOSIS — D126 Benign neoplasm of colon, unspecified: Secondary | ICD-10-CM | POA: Diagnosis not present

## 2011-11-23 DIAGNOSIS — E119 Type 2 diabetes mellitus without complications: Secondary | ICD-10-CM

## 2011-11-23 LAB — LDL CHOLESTEROL, DIRECT: Direct LDL: 122 mg/dL — ABNORMAL HIGH

## 2011-11-23 MED ORDER — METFORMIN HCL ER (OSM) 1000 MG PO TB24: ORAL_TABLET | ORAL | Status: DC

## 2011-11-23 NOTE — Patient Instructions (Addendum)
Your blood pressure looks good off the carvedilol.  Continue taking your Lotrel and Hydrochlorothiazide.  Take the Glipizide when you need it if your blood sugars are running high during times of stress.

## 2011-11-24 ENCOUNTER — Encounter: Payer: Self-pay | Admitting: Family Medicine

## 2011-11-24 DIAGNOSIS — Z0000 Encounter for general adult medical examination without abnormal findings: Secondary | ICD-10-CM | POA: Insufficient documentation

## 2011-11-24 NOTE — Assessment & Plan Note (Signed)
Advised Zostavax.  Rx for Zostavax gien to patient to take to [facility] to get vaccinated/

## 2011-11-24 NOTE — Progress Notes (Signed)
  Subjective:    Patient ID: [name], female    DOB: 1950/11/04, 61 y.o.   MRN: [id number]  HPI HYPERTENSION  Disease Monitoring: Blood pressure range-<140/90 at home Chest pain- none      Dyspnea- none  Medications: Compliance- stopped carvedilol as recommended. Continues on Lotrel and HCTZ daily Lightheadedness- none   Edema- none   DIABETES  Disease Monitoring: Blood Sugar ranges-100 to 200 range Polyuria- no New Visual problems- no  Medications: Compliance- taking metformin 2000 mg daily, taking glipizide xl only occasionally when stressed and blood sugars running high Hypoglycemic symptoms- none    HYPERLIPIDEMIA  Disease Monitoring: See symptoms for Hypertension  Medications: Compliance- taking atorvastatin 40 mg daily RUQ pain- no  Muscle aches- no    ROS See HPI above   PMH Does not smoke     Review of Systems See HPI      Objective:   Physical Exam        Assessment & Plan:

## 2011-11-24 NOTE — Assessment & Plan Note (Signed)
Adequate glycemic control based on reported home CBGs. Tolerating medication.  No new end-organ damage.  Continue current medications but will use Glipizide XL only when stressed with accompanying hyperglycemia. RTC 4 months.  Will need A1C at that visit.

## 2011-11-24 NOTE — Assessment & Plan Note (Signed)
Tolerating atorvastatin LDL (11/23/11): 122 mg/dL. Continue atorvastatin.

## 2011-11-24 NOTE — Assessment & Plan Note (Signed)
Adequate blood pressure control off of carvedilol.  No evidence of new end organ damage.  Tolerating medication without significant adverse effects.  Plan to continue Lotrel and HCTZ.  Need to check BMET next OV in 4 months.Marland Kitchen

## 2011-11-24 NOTE — Assessment & Plan Note (Signed)
Last colonoscopy in 202 found colonic polyp.  No surveillance colonoscopy since then.  Will refer patient to [facility] for consideration of colonoscopic evaluation.

## 2011-11-25 ENCOUNTER — Telehealth: Payer: Self-pay | Admitting: Family Medicine

## 2011-11-25 NOTE — Telephone Encounter (Signed)
Patient is calling because her payor will not pay for Metformin, bu they will pay for Glucophage XR.  The Rx needs to be written out for her to pick up to take to [facility].  Please call her when it is ready for pick up.

## 2011-11-25 NOTE — Telephone Encounter (Signed)
Will forward to PCP 

## 2011-11-26 MED ORDER — METFORMIN HCL ER 500 MG PO TB24: 1000.0000 mg | ORAL_TABLET | Freq: Two times a day (BID) | ORAL | Status: DC

## 2011-11-26 NOTE — Telephone Encounter (Signed)
Please let Ms Merkey know her new prescription is available for pick up from the clinic's front desk.

## 2011-11-26 NOTE — Telephone Encounter (Signed)
LMOVM informing pt...............................Angles Trevizo, CMA  

## 2011-12-07 ENCOUNTER — Other Ambulatory Visit: Payer: Self-pay | Admitting: Family Medicine

## 2011-12-07 MED ORDER — AZITHROMYCIN 250 MG PO TABS: ORAL_TABLET | ORAL | Status: AC

## 2011-12-16 ENCOUNTER — Telehealth: Payer: Self-pay | Admitting: Family Medicine

## 2011-12-16 NOTE — Telephone Encounter (Signed)
Patient calling to say she's still quite congested.  Feels like she has cotton stuffed in her ears.  Want to know if he could call her with a recommendation about what she can do to clear everything up.

## 2011-12-17 NOTE — Telephone Encounter (Signed)
Recommend topical Afrin nasal spray for five days, tylenol for pain, come in for evaluation if not improved within 10 days, or if develops fever.

## 2011-12-17 NOTE — Telephone Encounter (Signed)
Pt states she has already used the Afrin x4days, this is the worst she has ever felt.pt advised to schedule an appt. Pt agreed.

## 2011-12-21 ENCOUNTER — Encounter: Payer: Self-pay | Admitting: Internal Medicine

## 2011-12-24 ENCOUNTER — Other Ambulatory Visit: Payer: Self-pay | Admitting: Family Medicine

## 2011-12-24 MED ORDER — NAPROXEN 500 MG PO TABS: 500.0000 mg | ORAL_TABLET | Freq: Two times a day (BID) | ORAL | Status: DC

## 2011-12-24 MED ORDER — HYDROCHLOROTHIAZIDE 25 MG PO TABS: 25.0000 mg | ORAL_TABLET | Freq: Every day | ORAL | Status: DC

## 2011-12-24 NOTE — Telephone Encounter (Signed)
Pt informed. [name]  

## 2011-12-24 NOTE — Telephone Encounter (Signed)
Needs refill on her HCTZ and naproxen - needs #90 x 3 refills.  Takes these to [facility] to get them filled

## 2011-12-24 NOTE — Telephone Encounter (Signed)
Please let patient know her prescription(s) are available for pick up from the [facility] front desk.  

## 2012-01-18 HISTORY — PX: COLONOSCOPY W/ POLYPECTOMY: SHX1380

## 2012-01-20 ENCOUNTER — Other Ambulatory Visit: Payer: Self-pay | Admitting: Family Medicine

## 2012-01-20 ENCOUNTER — Ambulatory Visit (AMBULATORY_SURGERY_CENTER): Payer: Medicare Other | Admitting: *Deleted

## 2012-01-20 VITALS — Ht 62.0 in | Wt 211.1 lb

## 2012-01-20 DIAGNOSIS — Z1211 Encounter for screening for malignant neoplasm of colon: Secondary | ICD-10-CM

## 2012-01-20 MED ORDER — PEG-KCL-NACL-NASULF-NA ASC-C 100 G PO SOLR: 1.0000 | Freq: Once | ORAL | Status: DC

## 2012-01-20 NOTE — Telephone Encounter (Signed)
Patient is calling for a refill for Ritalin.  Please call her when ready for pick up.

## 2012-01-25 ENCOUNTER — Telehealth: Payer: Self-pay | Admitting: Family Medicine

## 2012-01-25 MED ORDER — METHYLPHENIDATE HCL 10 MG PO TABS
20.0000 mg | ORAL_TABLET | Freq: Two times a day (BID) | ORAL | Status: DC
Start: 2012-01-25 — End: 2012-02-29

## 2012-01-25 NOTE — Telephone Encounter (Signed)
Please let patient know her prescription(s) are available for pick up from the [facility] front desk.  

## 2012-01-25 NOTE — Telephone Encounter (Signed)
Patient has gotten her letter that it will be time at the beginning of May for her to get her Diagnostic Mammogram and she is going to need a referral to go [facility].  Please giver her a call when this is done.

## 2012-01-25 NOTE — Telephone Encounter (Signed)
Pt informed. [name]  

## 2012-01-26 NOTE — Telephone Encounter (Signed)
Please let [name] know her order for her Diagnostic Mammogram and Eric's corrected Concerta prescription is available for pick up at [facility] front desk.

## 2012-01-26 NOTE — Telephone Encounter (Signed)
LMOVM informing pt...............................[name], CMA  

## 2012-02-03 ENCOUNTER — Encounter: Payer: Self-pay | Admitting: Internal Medicine

## 2012-02-03 ENCOUNTER — Ambulatory Visit (AMBULATORY_SURGERY_CENTER): Payer: Medicare Other | Admitting: Internal Medicine

## 2012-02-03 VITALS — BP 132/93 | HR 88 | Temp 98.2°F | Resp 15 | Ht 62.0 in | Wt 211.0 lb

## 2012-02-03 DIAGNOSIS — Z1211 Encounter for screening for malignant neoplasm of colon: Secondary | ICD-10-CM

## 2012-02-03 DIAGNOSIS — D126 Benign neoplasm of colon, unspecified: Secondary | ICD-10-CM | POA: Diagnosis not present

## 2012-02-03 DIAGNOSIS — E119 Type 2 diabetes mellitus without complications: Secondary | ICD-10-CM | POA: Diagnosis not present

## 2012-02-03 DIAGNOSIS — I10 Essential (primary) hypertension: Secondary | ICD-10-CM | POA: Diagnosis not present

## 2012-02-03 LAB — GLUCOSE, CAPILLARY: Glucose-Capillary: 170 mg/dL — ABNORMAL HIGH (ref 70–99)

## 2012-02-03 MED ORDER — SODIUM CHLORIDE 0.9 % IV SOLN: 500.0000 mL | INTRAVENOUS | Status: DC

## 2012-02-03 NOTE — Op Note (Signed)
[facility] [address] [place], [place]  [address]  COLONOSCOPY PROCEDURE REPORT  PATIENT:  Barbara, [name]  MR#:  [id number] BIRTHDATE:  10-09-1951, 61 yrs. old  GENDER:  female ENDOSCOPIST:  Carie Caddy. Wynter Isaacs, MD REF. BY:  Acquanetta Belling, M.D. PROCEDURE DATE:  02/03/2012 PROCEDURE:  Colon with cold biopsy polypectomy ASA CLASS:  Class II INDICATIONS:  Routine Risk Screening MEDICATIONS:   MAC sedation, administered by CRNA, propofol (Diprivan) 200 mg IV  DESCRIPTION OF PROCEDURE:   After the risks benefits and alternatives of the procedure were thoroughly explained, informed consent was obtained.  Digital rectal exam was performed and revealed no rectal masses.   The LB 180AL [id number] endoscope was introduced through the anus and advanced to the cecum, which was identified by both the appendix and ileocecal valve, without limitations.  The quality of the prep was excellent, using MoviPrep.  The instrument was then slowly withdrawn as the colon was fully examined. <<PROCEDUREIMAGES>> FINDINGS:  Three sessile polyps (< 5 mm) were found in the recto-sigmoid colon. The polyps were removed using cold biopsy forceps.  Otherwise normal colonoscopy without other polyps, masses, vascular ectasias, or inflammatory changes.   Retroflexed views in the rectum revealed no abnormalities.   The scope was then withdrawn from the cecum and the procedure completed.  COMPLICATIONS:  None  ENDOSCOPIC IMPRESSION: 1) Three polyps in the recto-sigmoid colon 2) Otherwise normal colonoscopy  RECOMMENDATIONS: 1) Await pathology results 2) If the polyps removed today are proven to be adenomatous (pre-cancerous) polyps, you will need a colonoscopy in 3 years. Otherwise you should continue to follow colorectal cancer screening guidelines for "routine risk" patients with a colonoscopy in 10 years. You will receive a letter within 1-2 weeks with the results of your biopsy as well as  final recommendations. Please call my office if you have not received a letter after 3 weeks.  Carie Caddy. Rhea Belton, MD  CC:  The Patient Acquanetta Belling, MD  n. eSIGNEDCarie Caddy. Donnetta Gillin at 02/03/2012 08:51 AM  Zonia Kief, [phone]

## 2012-02-03 NOTE — Progress Notes (Signed)
Patient did not experience any of the following events: a burn prior to discharge; a fall within the facility; wrong site/side/patient/procedure/implant event; or a hospital transfer or hospital admission upon discharge from the facility. (G8907) Patient did not have preoperative order for IV antibiotic SSI prophylaxis. (G8918)  

## 2012-02-03 NOTE — Patient Instructions (Signed)
YOU HAD AN ENDOSCOPIC PROCEDURE TODAY AT THE [facility]: Refer to the procedure report that was given to you for any specific questions about what was found during the examination.  If the procedure report does not answer your questions, please call your gastroenterologist to clarify.  If you requested that your care partner not be given the details of your procedure findings, then the procedure report has been included in a sealed envelope for you to review at your convenience later.  YOU SHOULD EXPECT: Some feelings of bloating in the abdomen. Passage of more gas than usual.  Walking can help get rid of the air that was put into your GI tract during the procedure and reduce the bloating. If you had a lower endoscopy (such as a colonoscopy or flexible sigmoidoscopy) you may notice spotting of blood in your stool or on the toilet paper. If you underwent a bowel prep for your procedure, then you may not have a normal bowel movement for a few days.  DIET: Your first meal following the procedure should be a light meal and then it is ok to progress to your normal diet.  A half-sandwich or bowl of soup is an example of a good first meal.  Heavy or fried foods are harder to digest and may make you feel nauseous or bloated.  Likewise meals heavy in dairy and vegetables can cause extra gas to form and this can also increase the bloating.  Drink plenty of fluids but you should avoid alcoholic beverages for 24 hours.  ACTIVITY: Your care partner should take you home directly after the procedure.  You should plan to take it easy, moving slowly for the rest of the day.  You can resume normal activity the day after the procedure however you should NOT DRIVE or use heavy machinery for 24 hours (because of the sedation medicines used during the test).    SYMPTOMS TO REPORT IMMEDIATELY: A gastroenterologist can be reached at any hour.  During normal business hours, 8:30 AM to 5:00 PM Monday through Friday,  call [phone].  After hours and on weekends, please call the GI answering service at [phone] who will take a message and have the physician on call contact you.   Following lower endoscopy (colonoscopy or flexible sigmoidoscopy):  Excessive amounts of blood in the stool  Significant tenderness or worsening of abdominal pains  Swelling of the abdomen that is new, acute  Fever of 100F or higher  Following upper endoscopy (EGD)  Vomiting of blood or coffee ground material  New chest pain or pain under the shoulder blades  Painful or persistently difficult swallowing  New shortness of breath  Fever of 100F or higher  Black, tarry-looking stools  FOLLOW UP: If any biopsies were taken you will be contacted by phone or by letter within the next 1-3 weeks.  Call your gastroenterologist if you have not heard about the biopsies in 3 weeks.  Our staff will call the home number listed on your records the next business day following your procedure to check on you and address any questions or concerns that you may have at that time regarding the information given to you following your procedure. This is a courtesy call and so if there is no answer at the home number and we have not heard from you through the emergency physician on call, we will assume that you have returned to your regular daily activities without incident.  SIGNATURES/CONFIDENTIALITY: You and/or your care   partner have signed paperwork which will be entered into your electronic medical record.  These signatures attest to the fact that that the information above on your After Visit Summary has been reviewed and is understood.  Full responsibility of the confidentiality of this discharge information lies with you and/or your care-partner.   HANDOUTS ON POLYPS 

## 2012-02-04 ENCOUNTER — Telehealth: Payer: Self-pay | Admitting: *Deleted

## 2012-02-04 NOTE — Telephone Encounter (Signed)
  Follow up Call-  Call back number 02/03/2012  Post procedure Call Back phone  # [phone]  Permission to leave phone message Yes     Patient questions:  Do you have a fever, pain , or abdominal swelling? no Pain Score  0 *  Have you tolerated food without any problems? yes  Have you been able to return to your normal activities? yes  Do you have any questions about your discharge instructions: Diet   no Medications  no Follow up visit  no  Do you have questions or concerns about your Care? no  Actions: * If pain score is 4 or above: No action needed, pain <4.

## 2012-02-09 ENCOUNTER — Encounter: Payer: Self-pay | Admitting: Family Medicine

## 2012-02-11 ENCOUNTER — Encounter: Payer: Self-pay | Admitting: Internal Medicine

## 2012-02-16 ENCOUNTER — Other Ambulatory Visit: Payer: Self-pay | Admitting: Family Medicine

## 2012-02-29 ENCOUNTER — Ambulatory Visit (INDEPENDENT_AMBULATORY_CARE_PROVIDER_SITE_OTHER): Payer: Medicare Other | Admitting: Family Medicine

## 2012-02-29 ENCOUNTER — Encounter: Payer: Self-pay | Admitting: Family Medicine

## 2012-02-29 VITALS — BP 140/80 | HR 80 | Temp 98.9°F | Ht 62.0 in | Wt 214.0 lb

## 2012-02-29 DIAGNOSIS — I10 Essential (primary) hypertension: Secondary | ICD-10-CM | POA: Diagnosis not present

## 2012-02-29 DIAGNOSIS — E119 Type 2 diabetes mellitus without complications: Secondary | ICD-10-CM

## 2012-02-29 LAB — POCT GLYCOSYLATED HEMOGLOBIN (HGB A1C): Hemoglobin A1C: 7.3

## 2012-02-29 MED ORDER — HYDROCODONE-ACETAMINOPHEN 7.5-325 MG PO TABS: 1.0000 | ORAL_TABLET | Freq: Two times a day (BID) | ORAL | Status: DC

## 2012-02-29 NOTE — Patient Instructions (Signed)
I will send you the Ritalin prescriptions for you and Minerva Areola. Great blood sugar control! Good Blood pressure control.  Your weight is up 3 pounds. Watch Bernelle[employer] ice cream.

## 2012-03-01 ENCOUNTER — Encounter: Payer: Self-pay | Admitting: Family Medicine

## 2012-03-01 NOTE — Progress Notes (Signed)
  Subjective:    Patient ID: [name], female    DOB: [birth year], 61 y.o.   MRN: [id number]  HPI HYPERTENSION  Disease Monitoring:  Blood pressure range-<140/90 at home Chest pain- none Dyspnea- none  Medications:  Compliance- stopped carvedilol as recommended. Continues on Lotrel and HCTZ daily Lightheadedness- none Edema- none  DIABETES  Disease Monitoring:  Blood Sugar ranges-100 to 200 range Polyuria- no New Visual problems- no  Medications:  Compliance- taking metformin 2000 mg daily, taking glipizide xl only occasionally when stressed and blood sugars running high  Hypoglycemic symptoms- none  No formal exercise.   Review of SystemsSee HPI     Objective:   Physical Exam  Vitals reviewed. Constitutional: No distress.       obese  Cardiovascular: Normal rate, regular rhythm, normal heart sounds and intact distal pulses.   No murmur heard. Pulmonary/Chest: Effort normal and breath sounds normal.  Musculoskeletal:       See Diabetic foot exam chart for today          Assessment & Plan:

## 2012-03-01 NOTE — Assessment & Plan Note (Signed)
Adequate blood pressure control.  No evidence of new end organ damage.  Tolerating medication without significant adverse effects.  Plan to continue current blood pressure regiment.   

## 2012-03-01 NOTE — Assessment & Plan Note (Signed)
Lab Results  Component Value Date   HGBA1C 7.3 02/29/2012  Adequate glycemic control. Tolerating medications.  No new end-organ damage.  Continue current medications.

## 2012-03-08 ENCOUNTER — Other Ambulatory Visit: Payer: Self-pay | Admitting: Family Medicine

## 2012-03-08 DIAGNOSIS — F988 Other specified behavioral and emotional disorders with onset usually occurring in childhood and adolescence: Secondary | ICD-10-CM

## 2012-03-08 MED ORDER — METHYLPHENIDATE HCL 10 MG PO TABS: 10.0000 mg | ORAL_TABLET | Freq: Two times a day (BID) | ORAL | Status: DC

## 2012-03-08 MED ORDER — METHYLPHENIDATE HCL 10 MG PO TABS: 20.0000 mg | ORAL_TABLET | Freq: Two times a day (BID) | ORAL | Status: DC

## 2012-03-17 ENCOUNTER — Encounter: Payer: Self-pay | Admitting: Family Medicine

## 2012-03-17 ENCOUNTER — Ambulatory Visit (INDEPENDENT_AMBULATORY_CARE_PROVIDER_SITE_OTHER): Payer: Medicare Other | Admitting: Family Medicine

## 2012-03-17 VITALS — BP 183/77 | HR 125 | Temp 98.2°F | Ht 62.0 in | Wt 215.0 lb

## 2012-03-17 DIAGNOSIS — R209 Unspecified disturbances of skin sensation: Secondary | ICD-10-CM

## 2012-03-17 DIAGNOSIS — M79601 Pain in right arm: Secondary | ICD-10-CM | POA: Insufficient documentation

## 2012-03-17 NOTE — Assessment & Plan Note (Addendum)
While patient has history of possible Carpel Tunnel syndrome in her left hand in August 2012, this current constellation sharp, brief pain radiating into hands with neck movement makes me concerned for a serious cervical spinal process e.g., HNP with  Myelopathy. HNP or foraminal stenosis with radiculopathy or progression of patient's carpel tunnel with now bilateral involvement, or sensorimotor symmetric polyneuropathy ANA/B12/ESR/TSH 03/17/12 are WNL  SPEP pending. Will check cervical MRI.  Recommended Ms Bells to wear soft cervical collar.  Red flags for acute myelopathy reviewed.  Will contact patient once imaging and labs results return.

## 2012-03-17 NOTE — Patient Instructions (Addendum)
I am concerned that you may have a cervical spine disc herniation. Dr Jaison Petraglia is checking for metabolic and diseases that can cause the numbness in your hands.  Wear the neck collar for 16 hours a day.  Wear it in your sleep, if you can. It is okay to take your Naprosyn if there is any pain.  If you develop pain, or weakness in your arms or legs, including worsening in the weakness of your left arm/hand, let Dr Kenzel Ruesch know immediately.  If you develop inability to urinate or hold your urine, let Dr Kanishk Stroebel know immediately.  If there is a herniated disc that is not pressing on the spinal cord, Dr Kerrington Sova will probably refer you to physical therapy.  Marland Kitchentm

## 2012-03-17 NOTE — Progress Notes (Signed)
  Subjective:    Patient ID: [name], female    DOB: 1951-08-07, 61 y.o.   MRN: [id number]  HPI Right-handed patient Onset about 1 to 2 months ago Paresthesias Persistent numbness in bilateral hands, particularly 2nd and 3rd digits. Sharp, brief shooting pain from neck down into arms/hands bilaterally after turning of neck. Brief sharp pain into between scapula with neck turns or flexion. Feels that left hand has become clumsy. No recalled acute trauma.  Has been lifting her granddgt who is a toddler. Not tried any medications.  Able to sleep without difficulty.  Numbness in fingers worsening in last week progressing to from intermittent to constant. No persistent pain in neck or arms. No leg weakness. No urinary incontinency or difficulty voiding. No fever/chills No night pain. No unexplained weight loss No smoking. No IVDA.       Review of Systems See HPI     Objective:   Physical Exam  Constitutional: Vital signs are normal. She is cooperative. She does not appear ill. No distress.  Neck:    Neurological: She is alert. A sensory deficit (Decrease sense of pin prick in palmar distal 2nd and 3rd digits bilaterally. ) is present. No cranial nerve deficit. She exhibits abnormal muscle tone (Decrease grip strength 4/5 left hand. ).  Reflex Scores:      Tricep reflexes are 1+ on the right side and 1+ on the left side.      Bicep reflexes are 1+ on the right side and 1+ on the left side.      Brachioradialis reflexes are 1+ on the right side and 1+ on the left side.      Negative Phalen and tinel signs bilaterally.   Psychiatric: She has a normal mood and affect. Her speech is normal and behavior is normal. Judgment and thought content normal. Cognition and memory are normal.          Assessment & Plan:

## 2012-03-21 LAB — PROTEIN ELECTROPHORESIS, SERUM
Albumin ELP: 53.3 % — ABNORMAL LOW (ref 55.8–66.1)
Alpha-1-Globulin: 4 % (ref 2.9–4.9)
Beta 2: 6.1 % (ref 3.2–6.5)
Beta Globulin: 6 % (ref 4.7–7.2)

## 2012-03-22 ENCOUNTER — Telehealth: Payer: Self-pay | Admitting: Family Medicine

## 2012-03-22 ENCOUNTER — Ambulatory Visit (HOSPITAL_COMMUNITY)
Admission: RE | Admit: 2012-03-22 | Discharge: 2012-03-22 | Disposition: A | Discharge status: Home / Self Care | Payer: Medicare Other | Source: Ambulatory Visit | Attending: Family Medicine | Admitting: Family Medicine

## 2012-03-22 DIAGNOSIS — M5380 Other specified dorsopathies, site unspecified: Secondary | ICD-10-CM | POA: Diagnosis not present

## 2012-03-22 DIAGNOSIS — M79601 Pain in right arm: Secondary | ICD-10-CM

## 2012-03-22 DIAGNOSIS — M4712 Other spondylosis with myelopathy, cervical region: Secondary | ICD-10-CM | POA: Diagnosis not present

## 2012-03-22 DIAGNOSIS — M4802 Spinal stenosis, cervical region: Secondary | ICD-10-CM

## 2012-03-22 DIAGNOSIS — M5030 Other cervical disc degeneration, unspecified cervical region: Secondary | ICD-10-CM | POA: Diagnosis not present

## 2012-03-22 DIAGNOSIS — M47812 Spondylosis without myelopathy or radiculopathy, cervical region: Secondary | ICD-10-CM | POA: Diagnosis not present

## 2012-03-22 DIAGNOSIS — R209 Unspecified disturbances of skin sensation: Secondary | ICD-10-CM | POA: Insufficient documentation

## 2012-03-22 DIAGNOSIS — M5020 Other cervical disc displacement, unspecified cervical region: Secondary | ICD-10-CM | POA: Diagnosis not present

## 2012-03-22 NOTE — Telephone Encounter (Signed)
Relayed findings of severe cervical spinal stenosis on MRI from today.  Patient having less numbness in right hand but still with significant numbness in left hand and continued clumsiness in left hand.  No urinary incontinence or difficulty voiding.   [name] is agreeable to seeing Neurosurgeons in consultation given weakness in left hand and persistent numbness in hands.

## 2012-03-24 ENCOUNTER — Telehealth: Payer: Self-pay | Admitting: Family Medicine

## 2012-03-24 NOTE — Telephone Encounter (Signed)
Patient is calling to find out if the appt has been made for her for [facility].

## 2012-03-25 NOTE — Telephone Encounter (Signed)
Advised pt that we faxed the request on 03/23/12 and if she had not heard back from them or [place] by Friday (next week) to give [place] a call back. Samy Ryner, [name]

## 2012-04-04 ENCOUNTER — Telehealth: Payer: Self-pay | Admitting: Family Medicine

## 2012-04-04 NOTE — Telephone Encounter (Signed)
Hasn't heard anything about appt for her hands.  Having a hard time and needs to see them asap.

## 2012-04-05 NOTE — Telephone Encounter (Signed)
Patient called to say that the referral was received yesterday around 5:00.

## 2012-04-05 NOTE — Telephone Encounter (Signed)
[name],  Have you heard anything back from the referral you faxed?

## 2012-04-08 DIAGNOSIS — M4802 Spinal stenosis, cervical region: Secondary | ICD-10-CM | POA: Diagnosis not present

## 2012-04-08 DIAGNOSIS — M4712 Other spondylosis with myelopathy, cervical region: Secondary | ICD-10-CM | POA: Diagnosis not present

## 2012-04-08 DIAGNOSIS — M5412 Radiculopathy, cervical region: Secondary | ICD-10-CM | POA: Diagnosis not present

## 2012-04-08 DIAGNOSIS — M542 Cervicalgia: Secondary | ICD-10-CM | POA: Diagnosis not present

## 2012-04-12 ENCOUNTER — Other Ambulatory Visit: Payer: Self-pay | Admitting: Neurosurgery

## 2012-04-18 ENCOUNTER — Encounter (HOSPITAL_COMMUNITY): Payer: Self-pay | Admitting: Pharmacy Technician

## 2012-04-26 ENCOUNTER — Encounter (HOSPITAL_COMMUNITY): Payer: Self-pay

## 2012-04-26 ENCOUNTER — Encounter (HOSPITAL_COMMUNITY)
Admission: RE | Admit: 2012-04-26 | Discharge: 2012-04-26 | Disposition: A | Discharge status: Home / Self Care | Payer: Medicare Other | Source: Ambulatory Visit | Attending: Neurosurgery | Admitting: Neurosurgery

## 2012-04-26 ENCOUNTER — Encounter (HOSPITAL_COMMUNITY)
Admission: RE | Admit: 2012-04-26 | Discharge: 2012-04-26 | Disposition: A | Discharge status: Home / Self Care | Payer: Medicare Other | Source: Ambulatory Visit | Attending: Anesthesiology | Admitting: Anesthesiology

## 2012-04-26 DIAGNOSIS — Z87891 Personal history of nicotine dependence: Secondary | ICD-10-CM | POA: Diagnosis not present

## 2012-04-26 DIAGNOSIS — I10 Essential (primary) hypertension: Secondary | ICD-10-CM | POA: Diagnosis not present

## 2012-04-26 DIAGNOSIS — M5030 Other cervical disc degeneration, unspecified cervical region: Secondary | ICD-10-CM | POA: Diagnosis not present

## 2012-04-26 DIAGNOSIS — M4712 Other spondylosis with myelopathy, cervical region: Secondary | ICD-10-CM | POA: Diagnosis not present

## 2012-04-26 DIAGNOSIS — Z01811 Encounter for preprocedural respiratory examination: Secondary | ICD-10-CM | POA: Diagnosis not present

## 2012-04-26 HISTORY — DX: Anemia, unspecified: D64.9

## 2012-04-26 HISTORY — DX: Gastro-esophageal reflux disease without esophagitis: K21.9

## 2012-04-26 HISTORY — DX: Reserved for concepts with insufficient information to code with codable children: IMO0002

## 2012-04-26 HISTORY — DX: Nausea with vomiting, unspecified: R11.2

## 2012-04-26 HISTORY — DX: Other specified postprocedural states: Z98.890

## 2012-04-26 LAB — BASIC METABOLIC PANEL
CO2: 23 mEq/L (ref 19–32)
Calcium: 10 mg/dL (ref 8.4–10.5)
Creatinine, Ser: 0.64 mg/dL (ref 0.50–1.10)
GFR calc Af Amer: >90 mL/min (ref >90)
GFR calc non Af Amer: >90 mL/min (ref >90)
Sodium: 132 mEq/L — ABNORMAL LOW (ref 135–145)

## 2012-04-26 LAB — CBC
Platelets: 333 10*3/uL (ref 150–400)
RBC: 4.98 MIL/uL (ref 3.87–5.11)
RDW: 13.2 % (ref 11.5–15.5)
WBC: 7.2 10*3/uL (ref 4.0–10.5)

## 2012-04-26 LAB — SURGICAL PCR SCREEN
MRSA, PCR: NEGATIVE
Staphylococcus aureus: NEGATIVE

## 2012-04-26 NOTE — Progress Notes (Addendum)
Stress test in epic neg 05 Chart to be reviewed by anesthesia pa  Labs, ekg

## 2012-04-26 NOTE — Pre-Procedure Instructions (Addendum)
20 [name]  04/26/2012   Your procedure is scheduled on:  04/28/12  Report to [facility] at930 AM.  Call this number if you have problems the morning of surgery: [phone]   Remember:   Do not eat food or drink:After Midnight.  .  Take these medicines the morning of surgery with A SIP OF WATER:pain med, omeprazole, amlodipine-benazepril, ritalin, robaxin  STOP naproxyn, aspirin   Do not wear jewelry, make-up or nail polish.  Do not wear lotions, powders, or perfumes. You may wear deodorant.  Do not shave 48 hours prior to surgery. Men may shave face and neck.  Do not bring valuables to the hospital.  Contacts, dentures or bridgework may not be worn into surgery.  Leave suitcase in the car. After surgery it may be brought to your room.  For patients admitted to the hospital, checkout time is 11:00 AM the day of discharge.   Patients discharged the day of surgery will not be allowed to drive home.  Name and phone number of your driver: **bernell spouse [phone]  Special Instructions: CHG Shower Use Special Wash: 1/2 bottle night before surgery and 1/2 bottle morning of surgery.   Please read over the following fact sheets that you were given: Pain Booklet, Coughing and Deep Breathing, MRSA Information and Surgical Site Infection Prevention

## 2012-04-27 MED ORDER — CEFAZOLIN SODIUM-DEXTROSE 2-3 GM-% IV SOLR
2.0000 g | INTRAVENOUS | Status: AC
  Administered 2012-04-28: 2 g via INTRAVENOUS
  Filled 2012-04-27: qty 50

## 2012-04-27 NOTE — Consult Note (Signed)
Anesthesia Chart Review:  Patient is a 61 year old female scheduled for C3-4, C4-5, C5-6 ACDF on 04/28/12 by Dr. Lovell Sheehan.  History includes obesity with BMI 38.4, former smoker, HTN, DM2, post-operative N/V, hypercholesterolemia, GERD, anemia, ADD, OA, diffuse idiopathic skeletal hyperostosis, bilateral TKA.  PCP is Dr. Tawanna Cooler McDiarmid who referred patient to Neurosurgery last month.    Labs noted.  Non-fasting glucose is 479!  Her A1C on 03/17/12 was 7.2.  She denies recent steroids or signs/symptoms of infection.  She reports that she has not been consistent with taking Glipizide, but has been taking Glucophage on a regular basis.  Her sugars at home have been 130-170.  She had just eaten before her PAT appointment.  CXR from 04/26/12 showed no active lung disease.  EKG on 04/26/12 showed ST @ 108 (down from 119 at PAT), PAC's, cannot rule out anterior infarct (age undetermined), non-specific inferiolateral ST/T wave abnormality.  Her last stress test was on 04/30/04 and was normal with EF 63%.  She denies CP, SOB.  She is not particularly active due to back and neck pain.  I reviewed her history, glucose, and EKG with Anesthesiologist Dr. Michelle Piper.  We will check a CBG on arrival. If her glucose is not significantly elevated then would plan to proceed.  Darl Pikes at Dr. Lovell Sheehan' office updated.  When I spoke with patient on the phone, I also notified her of her time change to 0730.  She was told to be NPO (nothing to eat or drink) after midnight except sips of H2O with meds and to arrive at 0530.  She was told not to take her oral DM2 meds tomorrow morning.    Shonna Chock, PA-C 04/27/12 1250  Shonna Chock, PA-C

## 2012-04-28 ENCOUNTER — Encounter (HOSPITAL_COMMUNITY)
Admission: RE | Disposition: A | Discharge status: Home / Self Care | Payer: Self-pay | Source: Ambulatory Visit | Attending: Neurosurgery

## 2012-04-28 ENCOUNTER — Inpatient Hospital Stay (HOSPITAL_COMMUNITY): Payer: Medicare Other | Admitting: Vascular Surgery

## 2012-04-28 ENCOUNTER — Encounter (HOSPITAL_COMMUNITY): Payer: Self-pay | Admitting: Vascular Surgery

## 2012-04-28 ENCOUNTER — Inpatient Hospital Stay (HOSPITAL_COMMUNITY)
Admission: RE | Admit: 2012-04-28 | Discharge: 2012-04-30 | DRG: 472 | Disposition: A | Discharge status: Home / Self Care | Payer: Medicare Other | Source: Ambulatory Visit | Attending: Neurosurgery | Admitting: Neurosurgery

## 2012-04-28 ENCOUNTER — Inpatient Hospital Stay (HOSPITAL_COMMUNITY): Payer: Medicare Other

## 2012-04-28 DIAGNOSIS — M5137 Other intervertebral disc degeneration, lumbosacral region: Secondary | ICD-10-CM | POA: Diagnosis not present

## 2012-04-28 DIAGNOSIS — E119 Type 2 diabetes mellitus without complications: Secondary | ICD-10-CM | POA: Diagnosis present

## 2012-04-28 DIAGNOSIS — Z87891 Personal history of nicotine dependence: Secondary | ICD-10-CM

## 2012-04-28 DIAGNOSIS — M4712 Other spondylosis with myelopathy, cervical region: Secondary | ICD-10-CM | POA: Diagnosis present

## 2012-04-28 DIAGNOSIS — Z7982 Long term (current) use of aspirin: Secondary | ICD-10-CM

## 2012-04-28 DIAGNOSIS — I10 Essential (primary) hypertension: Secondary | ICD-10-CM | POA: Diagnosis not present

## 2012-04-28 DIAGNOSIS — M47812 Spondylosis without myelopathy or radiculopathy, cervical region: Secondary | ICD-10-CM | POA: Diagnosis not present

## 2012-04-28 DIAGNOSIS — Z96659 Presence of unspecified artificial knee joint: Secondary | ICD-10-CM | POA: Diagnosis not present

## 2012-04-28 DIAGNOSIS — M5030 Other cervical disc degeneration, unspecified cervical region: Secondary | ICD-10-CM | POA: Diagnosis not present

## 2012-04-28 DIAGNOSIS — Z01812 Encounter for preprocedural laboratory examination: Secondary | ICD-10-CM

## 2012-04-28 DIAGNOSIS — K219 Gastro-esophageal reflux disease without esophagitis: Secondary | ICD-10-CM | POA: Diagnosis present

## 2012-04-28 DIAGNOSIS — M4802 Spinal stenosis, cervical region: Secondary | ICD-10-CM | POA: Diagnosis not present

## 2012-04-28 DIAGNOSIS — M5090 Cervical disc disorder, unspecified, unspecified cervical region: Secondary | ICD-10-CM | POA: Diagnosis not present

## 2012-04-28 HISTORY — DX: Other spondylosis with myelopathy, cervical region: M47.12

## 2012-04-28 HISTORY — PX: ANTERIOR CERVICAL DECOMP/DISCECTOMY FUSION: SHX1161

## 2012-04-28 LAB — GLUCOSE, CAPILLARY
Glucose-Capillary: 235 mg/dL — ABNORMAL HIGH (ref 70–99)
Glucose-Capillary: 258 mg/dL — ABNORMAL HIGH (ref 70–99)
Glucose-Capillary: 303 mg/dL — ABNORMAL HIGH (ref 70–99)

## 2012-04-28 SURGERY — ANTERIOR CERVICAL DECOMPRESSION/DISCECTOMY FUSION 3 LEVELS
Anesthesia: General | Site: Spine Cervical | Wound class: Clean

## 2012-04-28 MED ORDER — SODIUM CHLORIDE 0.9 % IR SOLN
Status: DC | PRN
  Administered 2012-04-28: 11:00:00

## 2012-04-28 MED ORDER — BENAZEPRIL HCL 20 MG PO TABS
20.0000 mg | ORAL_TABLET | Freq: Every day | ORAL | Status: DC
  Administered 2012-04-29 – 2012-04-30 (×2): 20 mg via ORAL
  Filled 2012-04-28 (×2): qty 1

## 2012-04-28 MED ORDER — ROCURONIUM BROMIDE 100 MG/10ML IV SOLN
INTRAVENOUS | Status: DC | PRN
  Administered 2012-04-28: 50 mg via INTRAVENOUS

## 2012-04-28 MED ORDER — BUPIVACAINE-EPINEPHRINE PF 0.5-1:200000 % IJ SOLN
INTRAMUSCULAR | Status: DC | PRN
  Administered 2012-04-28: 10 mL

## 2012-04-28 MED ORDER — INSULIN ASPART 100 UNIT/ML ~~LOC~~ SOLN
5.0000 [IU] | Freq: Once | SUBCUTANEOUS | Status: AC
  Administered 2012-04-28: 5 [IU] via SUBCUTANEOUS

## 2012-04-28 MED ORDER — BACITRACIN ZINC 500 UNIT/GM EX OINT
TOPICAL_OINTMENT | CUTANEOUS | Status: DC | PRN
  Administered 2012-04-28: 1 via TOPICAL

## 2012-04-28 MED ORDER — BACITRACIN 50000 UNITS IM SOLR
INTRAMUSCULAR | Status: AC
  Filled 2012-04-28: qty 1

## 2012-04-28 MED ORDER — PANTOPRAZOLE SODIUM 40 MG PO TBEC
40.0000 mg | DELAYED_RELEASE_TABLET | Freq: Every day | ORAL | Status: DC
  Administered 2012-04-29: 40 mg via ORAL
  Filled 2012-04-28: qty 1

## 2012-04-28 MED ORDER — INSULIN ASPART 100 UNIT/ML ~~LOC~~ SOLN
0.0000 [IU] | SUBCUTANEOUS | Status: DC
  Administered 2012-04-28: 11 [IU] via SUBCUTANEOUS
  Administered 2012-04-29: 4 [IU] via SUBCUTANEOUS
  Administered 2012-04-29: 11 [IU] via SUBCUTANEOUS
  Administered 2012-04-29: 7 [IU] via SUBCUTANEOUS
  Administered 2012-04-29: 11 [IU] via SUBCUTANEOUS

## 2012-04-28 MED ORDER — CEFAZOLIN SODIUM-DEXTROSE 2-3 GM-% IV SOLR
2.0000 g | Freq: Three times a day (TID) | INTRAVENOUS | Status: DC
  Administered 2012-04-29 – 2012-04-30 (×4): 2 g via INTRAVENOUS
  Filled 2012-04-28 (×8): qty 50

## 2012-04-28 MED ORDER — LACTATED RINGERS IV SOLN
INTRAVENOUS | Status: DC | PRN
  Administered 2012-04-28 (×3): via INTRAVENOUS

## 2012-04-28 MED ORDER — PHENYLEPHRINE HCL 10 MG/ML IJ SOLN
INTRAMUSCULAR | Status: DC | PRN
  Administered 2012-04-28 (×3): 80 ug via INTRAVENOUS

## 2012-04-28 MED ORDER — 0.9 % SODIUM CHLORIDE (POUR BTL) OPTIME
TOPICAL | Status: DC | PRN
  Administered 2012-04-28: 1000 mL

## 2012-04-28 MED ORDER — DIAZEPAM 5 MG PO TABS
5.0000 mg | ORAL_TABLET | Freq: Four times a day (QID) | ORAL | Status: DC | PRN
  Administered 2012-04-28 – 2012-04-30 (×6): 5 mg via ORAL
  Filled 2012-04-28 (×5): qty 1

## 2012-04-28 MED ORDER — HYDROCODONE-ACETAMINOPHEN 5-325 MG PO TABS
1.0000 | ORAL_TABLET | ORAL | Status: DC | PRN
  Administered 2012-04-28 – 2012-04-29 (×3): 2 via ORAL
  Filled 2012-04-28 (×3): qty 2

## 2012-04-28 MED ORDER — METHYLPHENIDATE HCL 5 MG PO TABS
20.0000 mg | ORAL_TABLET | Freq: Three times a day (TID) | ORAL | Status: DC
  Administered 2012-04-28: 20 mg via ORAL
  Administered 2012-04-29: 10 mg via ORAL
  Administered 2012-04-29 – 2012-04-30 (×2): 20 mg via ORAL
  Filled 2012-04-28 (×4): qty 4

## 2012-04-28 MED ORDER — GLYCOPYRROLATE 0.2 MG/ML IJ SOLN
INTRAMUSCULAR | Status: DC | PRN
  Administered 2012-04-28: 0.6 mg via INTRAVENOUS

## 2012-04-28 MED ORDER — HYDROMORPHONE HCL PF 1 MG/ML IJ SOLN
0.2500 mg | INTRAMUSCULAR | Status: DC | PRN
  Administered 2012-04-28 (×4): 0.5 mg via INTRAVENOUS

## 2012-04-28 MED ORDER — SODIUM CHLORIDE 0.9 % IV SOLN
INTRAVENOUS | Status: AC
  Filled 2012-04-28: qty 500

## 2012-04-28 MED ORDER — ZOLPIDEM TARTRATE 5 MG PO TABS: 5.0000 mg | ORAL_TABLET | Freq: Every evening | ORAL | Status: DC | PRN

## 2012-04-28 MED ORDER — LACTATED RINGERS IV SOLN: INTRAVENOUS | Status: DC

## 2012-04-28 MED ORDER — ONDANSETRON HCL 4 MG/2ML IJ SOLN
4.0000 mg | Freq: Once | INTRAMUSCULAR | Status: AC | PRN
  Administered 2012-04-28 (×2): 4 mg via INTRAVENOUS

## 2012-04-28 MED ORDER — PHENYLEPHRINE HCL 10 MG/ML IJ SOLN
10.0000 mg | INTRAVENOUS | Status: DC | PRN
  Administered 2012-04-28: 15 ug/min via INTRAVENOUS

## 2012-04-28 MED ORDER — DEXAMETHASONE SODIUM PHOSPHATE 4 MG/ML IJ SOLN
2.0000 mg | Freq: Four times a day (QID) | INTRAMUSCULAR | Status: AC
  Administered 2012-04-29: 2 mg via INTRAVENOUS
  Filled 2012-04-28: qty 1

## 2012-04-28 MED ORDER — ACETAMINOPHEN 650 MG RE SUPP: 650.0000 mg | RECTAL | Status: DC | PRN

## 2012-04-28 MED ORDER — HYDROCHLOROTHIAZIDE 25 MG PO TABS
25.0000 mg | ORAL_TABLET | Freq: Every day | ORAL | Status: DC
  Administered 2012-04-29 – 2012-04-30 (×2): 25 mg via ORAL
  Filled 2012-04-28 (×3): qty 1

## 2012-04-28 MED ORDER — AMLODIPINE BESYLATE 5 MG PO TABS
5.0000 mg | ORAL_TABLET | Freq: Every day | ORAL | Status: DC
  Administered 2012-04-29 – 2012-04-30 (×2): 5 mg via ORAL
  Filled 2012-04-28 (×2): qty 1

## 2012-04-28 MED ORDER — VECURONIUM BROMIDE 10 MG IV SOLR
INTRAVENOUS | Status: DC | PRN
  Administered 2012-04-28: 3 mg via INTRAVENOUS
  Administered 2012-04-28 (×2): 2 mg via INTRAVENOUS

## 2012-04-28 MED ORDER — HYDROMORPHONE HCL PF 1 MG/ML IJ SOLN: 0.2500 mg | INTRAMUSCULAR | Status: DC | PRN

## 2012-04-28 MED ORDER — NEOSTIGMINE METHYLSULFATE 1 MG/ML IJ SOLN
INTRAMUSCULAR | Status: DC | PRN
  Administered 2012-04-28: 4 mg via INTRAVENOUS

## 2012-04-28 MED ORDER — ACETAMINOPHEN 325 MG PO TABS: 650.0000 mg | ORAL_TABLET | ORAL | Status: DC | PRN

## 2012-04-28 MED ORDER — MENTHOL 3 MG MT LOZG: 1.0000 | LOZENGE | OROMUCOSAL | Status: DC | PRN

## 2012-04-28 MED ORDER — PROPOFOL 10 MG/ML IV EMUL
INTRAVENOUS | Status: DC | PRN
  Administered 2012-04-28: 150 mg via INTRAVENOUS

## 2012-04-28 MED ORDER — GLIPIZIDE ER 5 MG PO TB24
5.0000 mg | ORAL_TABLET | Freq: Every day | ORAL | Status: DC
  Administered 2012-04-29 – 2012-04-30 (×2): 5 mg via ORAL
  Filled 2012-04-28 (×3): qty 1

## 2012-04-28 MED ORDER — DIAZEPAM 5 MG PO TABS
ORAL_TABLET | ORAL | Status: AC
  Filled 2012-04-28: qty 1

## 2012-04-28 MED ORDER — METOCLOPRAMIDE HCL 5 MG/ML IJ SOLN
INTRAMUSCULAR | Status: DC | PRN
  Administered 2012-04-28: 10 mg via INTRAVENOUS

## 2012-04-28 MED ORDER — MIDAZOLAM HCL 5 MG/5ML IJ SOLN
INTRAMUSCULAR | Status: DC | PRN
  Administered 2012-04-28: 1 mg via INTRAVENOUS

## 2012-04-28 MED ORDER — THROMBIN 20000 UNITS EX KIT
PACK | CUTANEOUS | Status: DC | PRN
  Administered 2012-04-28: 11:00:00 via TOPICAL

## 2012-04-28 MED ORDER — LIDOCAINE HCL (CARDIAC) 20 MG/ML IV SOLN
INTRAVENOUS | Status: DC | PRN
  Administered 2012-04-28: 80 mg via INTRAVENOUS

## 2012-04-28 MED ORDER — DEXAMETHASONE 4 MG PO TABS
4.0000 mg | ORAL_TABLET | Freq: Four times a day (QID) | ORAL | Status: AC
  Administered 2012-04-28: 4 mg via ORAL
  Filled 2012-04-28: qty 1

## 2012-04-28 MED ORDER — HYDROMORPHONE HCL PF 1 MG/ML IJ SOLN
INTRAMUSCULAR | Status: AC
  Filled 2012-04-28: qty 1

## 2012-04-28 MED ORDER — OXYCODONE-ACETAMINOPHEN 5-325 MG PO TABS
1.0000 | ORAL_TABLET | ORAL | Status: DC | PRN
  Administered 2012-04-29 (×2): 1 via ORAL
  Administered 2012-04-29 – 2012-04-30 (×3): 2 via ORAL
  Filled 2012-04-28: qty 1
  Filled 2012-04-28 (×4): qty 2

## 2012-04-28 MED ORDER — ONDANSETRON HCL 4 MG/2ML IJ SOLN: 4.0000 mg | Freq: Once | INTRAMUSCULAR | Status: DC | PRN

## 2012-04-28 MED ORDER — PHENOL 1.4 % MT LIQD
1.0000 | OROMUCOSAL | Status: DC | PRN
  Administered 2012-04-29: 1 via OROMUCOSAL
  Filled 2012-04-28: qty 177

## 2012-04-28 MED ORDER — METFORMIN HCL ER 500 MG PO TB24
1000.0000 mg | ORAL_TABLET | Freq: Two times a day (BID) | ORAL | Status: DC
  Administered 2012-04-29 – 2012-04-30 (×3): 1000 mg via ORAL
  Filled 2012-04-28 (×6): qty 2

## 2012-04-28 MED ORDER — FENTANYL CITRATE 0.05 MG/ML IJ SOLN
INTRAMUSCULAR | Status: DC | PRN
  Administered 2012-04-28: 150 ug via INTRAVENOUS
  Administered 2012-04-28 (×3): 50 ug via INTRAVENOUS

## 2012-04-28 MED ORDER — INSULIN ASPART 100 UNIT/ML ~~LOC~~ SOLN
SUBCUTANEOUS | Status: AC
  Filled 2012-04-28: qty 1

## 2012-04-28 MED ORDER — DOCUSATE SODIUM 100 MG PO CAPS
100.0000 mg | ORAL_CAPSULE | Freq: Two times a day (BID) | ORAL | Status: DC
  Administered 2012-04-28 – 2012-04-30 (×4): 100 mg via ORAL
  Filled 2012-04-28 (×4): qty 1

## 2012-04-28 MED ORDER — AMLODIPINE BESY-BENAZEPRIL HCL 10-20 MG PO CAPS: 1.0000 | ORAL_CAPSULE | Freq: Every day | ORAL | Status: DC

## 2012-04-28 MED ORDER — MORPHINE SULFATE 2 MG/ML IJ SOLN
1.0000 mg | INTRAMUSCULAR | Status: DC | PRN
  Filled 2012-04-28: qty 2

## 2012-04-28 MED ORDER — ONDANSETRON HCL 4 MG/2ML IJ SOLN: 4.0000 mg | INTRAMUSCULAR | Status: DC | PRN

## 2012-04-28 MED ORDER — FLUTICASONE PROPIONATE 50 MCG/ACT NA SUSP
1.0000 | Freq: Every day | NASAL | Status: DC
  Administered 2012-04-30: 1 via NASAL
  Filled 2012-04-28: qty 16

## 2012-04-28 SURGICAL SUPPLY — 72 items
APL SKNCLS STERI-STRIP NONHPOA (GAUZE/BANDAGES/DRESSINGS) ×1
BAG DECANTER FOR FLEXI CONT (MISCELLANEOUS) ×2 IMPLANT
BENZOIN TINCTURE PRP APPL 2/3 (GAUZE/BANDAGES/DRESSINGS) ×3 IMPLANT
BIT DRILL SPINE QC 12 (BIT) ×1 IMPLANT
BLADE SURG 15 STRL LF DISP TIS (BLADE) ×1 IMPLANT
BLADE SURG 15 STRL SS (BLADE) ×4
BLADE ULTRA TIP 2M (BLADE) ×2 IMPLANT
BRUSH SCRUB EZ PLAIN DRY (MISCELLANEOUS) ×2 IMPLANT
BUR BARREL STRAIGHT FLUTE 4.0 (BURR) ×2 IMPLANT
BUR MATCHSTICK NEURO 3.0 LAGG (BURR) ×2 IMPLANT
CANISTER SUCTION 2500CC (MISCELLANEOUS) ×2 IMPLANT
CLOTH BEACON ORANGE TIMEOUT ST (SAFETY) ×2 IMPLANT
CONT SPEC 4OZ CLIKSEAL STRL BL (MISCELLANEOUS) ×2 IMPLANT
COVER MAYO STAND STRL (DRAPES) ×2 IMPLANT
DRAIN CHANNEL 7F 3/4 FLAT (WOUND CARE) ×1 IMPLANT
DRAIN JACKSON PRATT 10MM FLAT (MISCELLANEOUS) ×1 IMPLANT
DRAPE LAPAROTOMY 100X72 PEDS (DRAPES) ×2 IMPLANT
DRAPE MICROSCOPE LEICA (MISCELLANEOUS) IMPLANT
DRAPE POUCH INSTRU U-SHP 10X18 (DRAPES) ×2 IMPLANT
DRAPE SURG 17X23 STRL (DRAPES) ×4 IMPLANT
ELECT REM PT RETURN 9FT ADLT (ELECTROSURGICAL) ×2
ELECTRODE REM PT RTRN 9FT ADLT (ELECTROSURGICAL) ×1 IMPLANT
EVACUATOR SILICONE 100CC (DRAIN) ×2 IMPLANT
GAUZE SPONGE 4X4 16PLY XRAY LF (GAUZE/BANDAGES/DRESSINGS) ×1 IMPLANT
GLOVE BIO SURGEON STRL SZ8 (GLOVE) ×1 IMPLANT
GLOVE BIO SURGEON STRL SZ8.5 (GLOVE) ×2 IMPLANT
GLOVE BIOGEL PI IND STRL 7.0 (GLOVE) IMPLANT
GLOVE BIOGEL PI IND STRL 8 (GLOVE) IMPLANT
GLOVE BIOGEL PI IND STRL 8.5 (GLOVE) IMPLANT
GLOVE BIOGEL PI INDICATOR 7.0 (GLOVE) ×1
GLOVE BIOGEL PI INDICATOR 8 (GLOVE) ×3
GLOVE BIOGEL PI INDICATOR 8.5 (GLOVE) ×1
GLOVE ECLIPSE 7.5 STRL STRAW (GLOVE) ×2 IMPLANT
GLOVE EXAM NITRILE LRG STRL (GLOVE) IMPLANT
GLOVE EXAM NITRILE MD LF STRL (GLOVE) ×1 IMPLANT
GLOVE EXAM NITRILE XL STR (GLOVE) IMPLANT
GLOVE EXAM NITRILE XS STR PU (GLOVE) IMPLANT
GLOVE SS BIOGEL STRL SZ 7 (GLOVE) IMPLANT
GLOVE SUPERSENSE BIOGEL SZ 7 (GLOVE) ×1
GOWN BRE IMP SLV AUR LG STRL (GOWN DISPOSABLE) ×1 IMPLANT
GOWN BRE IMP SLV AUR XL STRL (GOWN DISPOSABLE) ×2 IMPLANT
GOWN STRL REIN 2XL LVL4 (GOWN DISPOSABLE) ×2 IMPLANT
KIT BASIN OR (CUSTOM PROCEDURE TRAY) ×2 IMPLANT
KIT ROOM TURNOVER OR (KITS) ×2 IMPLANT
MARKER SKIN DUAL TIP RULER LAB (MISCELLANEOUS) ×2 IMPLANT
NDL SPNL 18GX3.5 QUINCKE PK (NEEDLE) ×1 IMPLANT
NEEDLE HYPO 22GX1.5 SAFETY (NEEDLE) ×2 IMPLANT
NEEDLE SPNL 18GX3.5 QUINCKE PK (NEEDLE) ×2 IMPLANT
NS IRRIG 1000ML POUR BTL (IV SOLUTION) ×2 IMPLANT
PACK LAMINECTOMY NEURO (CUSTOM PROCEDURE TRAY) ×2 IMPLANT
PATTIES SURGICAL .5 X.5 (GAUZE/BANDAGES/DRESSINGS) ×1 IMPLANT
PATTIES SURGICAL 1X1 (DISPOSABLE) ×2 IMPLANT
PIN DISTRACTION 14MM (PIN) ×4 IMPLANT
PLATE ANT CERV XTEND 3 LV 48 (Plate) ×1 IMPLANT
PUTTY 5ML ACTIFUSE ABX (Putty) ×2 IMPLANT
PUTTY ABX ACTIFUSE 1.5ML (Putty) ×1 IMPLANT
RUBBERBAND STERILE (MISCELLANEOUS) IMPLANT
SCREW XTD VAR 4.2 SELF TAP 12 (Screw) ×8 IMPLANT
SPONGE GAUZE 4X4 12PLY (GAUZE/BANDAGES/DRESSINGS) ×2 IMPLANT
SPONGE INTESTINAL PEANUT (DISPOSABLE) ×4 IMPLANT
SPONGE SURGIFOAM ABS GEL 100 (HEMOSTASIS) ×2 IMPLANT
STRIP CLOSURE SKIN 1/2X4 (GAUZE/BANDAGES/DRESSINGS) ×2 IMPLANT
SUT ETHILON 3 0 FSL (SUTURE) ×1 IMPLANT
SUT VIC AB 0 CT1 27 (SUTURE) ×2
SUT VIC AB 0 CT1 27XBRD ANTBC (SUTURE) ×1 IMPLANT
SUT VIC AB 3-0 SH 8-18 (SUTURE) ×3 IMPLANT
SYR 20ML ECCENTRIC (SYRINGE) ×2 IMPLANT
TAPE CLOTH SURG 4X10 WHT LF (GAUZE/BANDAGES/DRESSINGS) ×1 IMPLANT
TOWEL OR 17X24 6PK STRL BLUE (TOWEL DISPOSABLE) ×2 IMPLANT
TOWEL OR 17X26 10 PK STRL BLUE (TOWEL DISPOSABLE) ×2 IMPLANT
VISTA S 14X14X7 (Spacer) ×3 IMPLANT
WATER STERILE IRR 1000ML POUR (IV SOLUTION) ×2 IMPLANT

## 2012-04-28 NOTE — Anesthesia Postprocedure Evaluation (Signed)
  Anesthesia Post-op Note  Patient: [name]  Procedure(s) Performed: Procedure(s) (LRB): ANTERIOR CERVICAL DECOMPRESSION/DISCECTOMY FUSION 3 LEVELS (N/A)  Patient Location: PACU  Anesthesia Type: General  Level of Consciousness: awake, alert  and oriented  Airway and Oxygen Therapy: Patient Spontanous Breathing and Patient connected to nasal cannula oxygen  Post-op Pain: mild  Post-op Assessment: Post-op Vital signs reviewed  Post-op Vital Signs: Reviewed  Complications: No apparent anesthesia complications

## 2012-04-28 NOTE — Progress Notes (Signed)
Subjective:  The patient is somnolent but easily arousable. She looks well. She is in no apparent distress.  Objective: Vital signs in last 24 hours: Temp:  [98.3 F (36.8 C)] 98.3 F (36.8 C) (07/11 0649) Pulse Rate:  [112] 112  (07/11 0649) Resp:  [20] 20  (07/11 0649) BP: (153)/(77) 153/77 mmHg (07/11 0649) SpO2:  [95 %] 95 % (07/11 0649)  Intake/Output from previous day:   Intake/Output this shift: Total I/O In: 2000 [I.V.:2000] Out: 675 [Urine:375; Blood:300]  Physical exam patient is moving all 4 extremities well.  Lab Results:  Basename 04/26/12 1425  WBC 7.2  HGB 12.9  HCT 39.1  PLT 333   BMET  Basename 04/26/12 1425  NA 132*  K 3.6  CL 92*  CO2 23  GLUCOSE 479*  BUN 14  CREATININE 0.64  CALCIUM 10.0    Studies/Results: Dg Chest 2 View  04/26/2012  *RADIOLOGY REPORT*  Clinical Data: Preop for anterior cervical spine fusion  CHEST - 2 VIEW  Comparison: Chest x-ray of 05/21/2010  Findings: No active infiltrate or effusion is seen.  Minimally prominent peribronchial thickening is present.  The heart is within normal limits in size.  There are degenerative changes throughout the thoracic spine.  IMPRESSION: No active lung disease.  Original Report Authenticated By: Juline Patch, M.D.   Dg Cervical Spine 2-3 Views  04/28/2012  *RADIOLOGY REPORT*  Clinical Data: 61 year old female undergoing cervical spine surgery.  CERVICAL SPINE - 2-3 VIEW  Comparison: Cervical MRI 03/22/2012.  Findings: Two intraoperative cross-table lateral views of the cervical spine.  Film #1 at 1055 hours.  Surgical probe directed the C3-C4 disc space.  Film #2 the 1325 hours.  ACDF hardware at C3-C4, C4-C5, and extending caudally from C5 (under penetrated).  IMPRESSION: Cervical ACDF depicted as above.  Original Report Authenticated By: Harley Hallmark, M.D.    Assessment/Plan: The patient is doing well.  LOS: 0 days     Abiha Lukehart D 04/28/2012, 2:03 PM

## 2012-04-28 NOTE — Anesthesia Preprocedure Evaluation (Addendum)
Anesthesia Evaluation  Patient identified by MRN, date of birth, ID band Patient awake and Patient confused    Reviewed: Allergy & Precautions, H&P , NPO status , Patient's Chart, lab work & pertinent test results  History of Anesthesia Complications (+) PONV  Airway Mallampati: I TM Distance: >3 FB Neck ROM: Limited    Dental  (+) Dental Advisory Given and Teeth Intact   Pulmonary former smoker,  breath sounds clear to auscultation        Cardiovascular hypertension, Pt. on medications Rhythm:Regular Rate:Normal     Neuro/Psych    GI/Hepatic GERD-  Medicated and Controlled,  Endo/Other  Poorly Controlled, Type obesity  Renal/GU      Musculoskeletal  (+) Arthritis -,   Abdominal   Peds  Hematology   Anesthesia Other Findings   Reproductive/Obstetrics                          Anesthesia Physical Anesthesia Plan  ASA: III  Anesthesia Plan: General   Post-op Pain Management:    Induction: Intravenous  Airway Management Planned: Oral ETT  Additional Equipment:   Intra-op Plan:   Post-operative Plan: Extubation in OR  Informed Consent: I have reviewed the patients History and Physical, chart, labs and discussed the procedure including the risks, benefits and alternatives for the proposed anesthesia with the patient or authorized representative who has indicated his/her understanding and acceptance.   Dental advisory given  Plan Discussed with: CRNA, Anesthesiologist and Surgeon  Anesthesia Plan Comments:         Anesthesia Quick Evaluation

## 2012-04-28 NOTE — Preoperative (Signed)
Beta Blockers   Reason not to administer Beta Blockers:Not Applicable 

## 2012-04-28 NOTE — Progress Notes (Signed)
1440 insulin given checked with Angel dose

## 2012-04-28 NOTE — Progress Notes (Signed)
Dr. Ivin Booty called informed repeat blood sugar 306 after receiving insulin no orders received

## 2012-04-28 NOTE — Anesthesia Procedure Notes (Signed)
Procedure Name: Intubation Date/Time: 04/28/2012 10:07 AM Performed by: Elon Alas Pre-anesthesia Checklist: Patient identified, Timeout performed, Emergency Drugs available, Suction available and Patient being monitored Patient Re-evaluated:Patient Re-evaluated prior to inductionOxygen Delivery Method: Circle system utilized Preoxygenation: Pre-oxygenation with 100% oxygen Intubation Type: IV induction and Cricoid Pressure applied Ventilation: Oral airway inserted - appropriate to patient size and Mask ventilation with difficulty Number of attempts: 1 Airway Equipment and Method: Video-laryngoscopy Placement Confirmation: ETT inserted through vocal cords under direct vision,  positive ETCO2 and breath sounds checked- equal and bilateral Secured at: 21 cm Tube secured with: Tape Dental Injury: Teeth and Oropharynx as per pre-operative assessment  Comments: Neck remained neutral with intubation

## 2012-04-28 NOTE — Op Note (Signed)
Brief history: The patient is a 61 year old black female who has presented with neck pain, arm pain and hand numbness. She has failed medical management and was worked up with cervical MRI. This demonstrated patient had significant spondylosis and stenosis at C3-4, C4-5 and C5-6. I discussed the various treatment options with the patient including surgery. Patient has weighed the risks, benefits, and alternatives surgery decided proceed with a three-level anterior cervical discectomy fusion and plating.  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 Delma Officer  Asst.: Dr. Maeola Harman  Anesthesia: Gen. endotracheal  Estimated blood loss: 125 cc  Drains: None  Complications: None  Description of procedure: The patient was brought to the operating room by the anesthesia team. General endotracheal anesthesia was induced. A roll was placed under the patient's shoulders to keep the neck in the neutral position. The patient's anterior cervical region was then prepared with Betadine scrub and Betadine solution. Sterile drapes were applied.  The area to be incised was then injected with Marcaine with epinephrine solution. I then used a scalpel to make a transverse incision in the patient's left anterior neck. I used the Metzenbaum scissors to divide the platysmal muscle and then to dissect medial to the sternocleidomastoid muscle, jugular vein, and carotid artery. I carefully dissected down towards the anterior cervical spine  identifying the esophagus and retracting it medially. Then using Kitner swabs to clear soft tissue from the anterior cervical spine. We then inserted a bent spinal needle into the upper exposed intervertebral disc space. We then obtained intraoperative radiographs confirm our location.  I then used electrocautery to detach the medial border of the longus colli muscle bilaterally from the C3-4, C4-5, and C5-6 intervertebral disc spaces. I then inserted the Caspar self-retaining retractor underneath the longus colli muscle bilaterally to provide exposure.  We then incised the intervertebral disc at C3-4. We then performed a partial intervertebral discectomy with a pituitary forceps and the Karlin curettes. I then inserted distraction screws into the vertebral bodies at C3 and C4. We then distracted the interspace. We then used the high-speed drill to decorticate the vertebral endplates at C3-4, to drill away the remainder of the intervertebral disc, to drill away some posterior spondylosis, and to thin out the posterior longitudinal ligament. I then incised ligament with the arachnoid knife. We then removed the ligament with a Kerrison punches undercutting the vertebral endplates and decompressing the thecal sac. We then performed foraminotomies about the bilateral C4 nerve roots. This completed the decompression at this level.  We then repeated this procedure in an analogous fashion at C4-5 and C5-6 decompressing the thecal sac at both levels as well as a bilateral C5 and C6 nerve roots.  We now turned our to attention to the interbody fusion. We used the trial spacers to determine the appropriate size for the interbody prosthesis. We then pre-filled prosthesis with a combination of local morcellized autograft bone that we obtained during decompression as well as Actifuse bone graft extender. We then inserted the prosthesis into the distracted interspace at C3-4, C4-5 and C5-6. We then removed the distraction  screws. There was a good snug fit of the prosthesis  in the interspace.   Having completed the fusion we now turned attention to the anterior spinal instrumentation. We used the high-speed drill to drill away some anterior spondylosis at the disc spaces so that the plate lay down flat. We selected the appropriate length titanium anterior cervical plate. We laid it along the anterior aspect of the vertebral bodies from C3-C6. We then drilled 12 mm holes at C3, C4, C5 and C6. We then secured the plate to the vertebral bodies by placing two 12 mm self-tapping screws at C3, C4, C5 and C6. We then obtained intraoperative radiograph. The demonstrating good position of the instrumentation. We therefore secured the screws the plate the locking each cam. This completed the instrumentation.  We then obtained hemostasis using bipolar electrocautery. We irrigated the wound out with bacitracin solution. We then removed the retractor. We inspected the esophagus for any damage. There was none apparent. I then placed a 10 mm flat Jackson-Pratt drain in the prevertebral space and tunneled out through separate stab wound. Secured the drain at the exit site with 3-0 nylon suture. We then reapproximated patient's platysmal muscle with interrupted 3-0 Vicryl suture. We then reapproximated the subcutaneous tissue with interrupted 3-0 Vicryl suture. The skin was reapproximated with Steri-Strips and benzoin. The wound was then covered with bacitracin ointment. A sterile dressing was applied. The drapes were removed. Patient was subsequently extubated by the anesthesia team and transported to the post anesthesia care unit in stable condition. All sponge instrument and needle counts were correct at the end of this case.

## 2012-04-28 NOTE — Transfer of Care (Signed)
Immediate Anesthesia Transfer of Care Note  Patient: [name]  Procedure(s) Performed: Procedure(s) (LRB): ANTERIOR CERVICAL DECOMPRESSION/DISCECTOMY FUSION 3 LEVELS (N/A)  Patient Location: PACU  Anesthesia Type: General  Level of Consciousness: awake, alert  and oriented  Airway & Oxygen Therapy: Patient Spontanous Breathing and Patient connected to nasal cannula oxygen  Post-op Assessment: Report given to PACU RN, Post -op Vital signs reviewed and stable and Patient moving all extremities X 4  Post vital signs: Reviewed and stable  Complications: No apparent anesthesia complications

## 2012-04-28 NOTE — H&P (Signed)
Subjective: The patient is a 61 year old black female who has suffered from neck and arm pain consistent with a cervical radiculopathy. She has failed medical management and was worked up with a cervical MRI. This demonstrated spondylosis, stenosis etc. at C3-4, C4-5 and C5-6. I discussed the various treatment options with the patient including surgery. The patient has weighed the risks, benefits, and alternatives surgery and decided proceed with a C3-4, C4-5, and C5-6 intracervical discectomy, fusion, and plating.   Past Medical History  Diagnosis Date  . History of right greater trochanteric bursitis 11/07/2009  . ATTENTION DEFICIT, W/O HYPERACTIVITY 12/16/2006  . DIABETES MELLITUS II, UNCOMPLICATED 12/16/2006  . HYPERTENSION, BENIGN SYSTEMIC 12/16/2006  . HYPERCHOLESTEROLEMIA 02/28/2007  . OSTEOARTHRITIS, MULTI SITES 12/16/2006  . DIFFUSE IDIOPATHIC SKELETAL HYPEROSTOSIS 03/29/2008  . Seasonal allergies   . History of blood transfusion 10/2004    with knee replacement  . PONV (postoperative nausea and vomiting)     last surgery only  . Ulcer   . GERD (gastroesophageal reflux disease)   . Anemia     hx    Past Surgical History  Procedure Date  . Replacement total knee bilateral   . Nm myoview ltd 04/2004    Cardiolite:EF63%, no ischemia - 05/01/2004,   . Colonoscopy w/ polypectomy 12/2000    colonoscopy (Dr Loreta Ave) int. hemorrhoids &  nonneoplatic colon polyps - 01/10/2001,   . Endometrial biopsy 08/2004    Endometrial Biopsy - 09/01/2004,  . Colonoscopy w/ polypectomy 01/2012    Dr Rhea Belton (GI).sigmoid colon, hyperlastic polyp  . Breast biopsy 07/2009    S/P Breast Biopsy [facility], [place], 07/2009): Benign findings.  (10/27/2010)    Allergies  Allergen Reactions  . Carvedilol (Coreg) Shortness Of Breath and Other (See Comments)    Chest tightness  . Celecoxib     REACTION: palpitations  . Diclofenac-Misoprostol     REACTION: palpitation    History  Substance Use  Topics  . Smoking status: Former Smoker -- 1.0 packs/day for 4 years    Types: Cigarettes    Quit date: 10/20/1979  . Smokeless tobacco: Never Used  . Alcohol Use: No    Family History  Problem Relation Age of Onset  . Hypertension Mother   . Diabetes type II Sister   . Kidney failure Sister   . Colon cancer Neg Hx   . Esophageal cancer Neg Hx   . Rectal cancer Neg Hx   . Stomach cancer Neg Hx    Prior to Admission medications   Medication Sig Start Date End Date Taking? Authorizing Provider  amLODipine-benazepril (LOTREL) 10-20 MG per capsule Take 1 capsule by mouth daily. 06/02/11  Yes [name], MD  aspirin 81 MG tablet Take 81 mg by mouth daily.     Yes Historical Provider, MD  glipiZIDE (GLIPIZIDE XL) 10 MG 24 hr tablet  02/16/12  Yes Todd D McDiarmid, MD  glucose blood (ACCU-CHEK SMARTVIEW) test strip Test 1 to 2 times per day 03/08/12  Yes Todd D McDiarmid, MD  hydrochlorothiazide (HYDRODIURIL) 25 MG tablet Take 1 tablet (25 mg total) by mouth daily. 12/24/11  Yes [name], MD  HYDROcodone-acetaminophen (NORCO) 7.5-325 MG per tablet Take 1 tablet by mouth 2 (two) times daily. 02/29/12  Yes [name], MD  metFORMIN (GLUCOPHAGE-XR) 500 MG 24 hr tablet Take 2 tablets (1,000 mg total) by mouth 2 (two) times daily. 11/26/11 11/25/12 Yes Todd D McDiarmid, MD  methocarbamol (ROBAXIN) 750 MG tablet Take 750  mg by mouth 3 (three) times daily. 2 tablets by mouth 4 times a day as needed for muscle spasm. 10/21/11  Yes [name], MD  methylphenidate (RITALIN) 10 MG tablet Take 1 tablet (10 mg total) by mouth 2 (two) times daily. Fill on or after May 07, 2012. 05/07/12 07/06/12 Yes Todd D McDiarmid, MD  methylphenidate (RITALIN) 10 MG tablet Take 20 mg by mouth 3 (three) times daily.  03/08/12 05/07/12 Yes Todd D McDiarmid, MD  naproxen (NAPROSYN) 500 MG tablet Take 1 tablet (500 mg total) by mouth 2 (two) times daily with a meal. 12/24/11  Yes [name], MD  Omeprazole 20  MG TBEC Take 20 mg by mouth daily as needed. For acid reflux   Yes Historical Provider, MD  triamcinolone cream (KENALOG) 0.1 % Apply topically 2 (two) times daily. 09/21/11 09/20/12 Yes Todd D McDiarmid, MD  fluticasone Aleda Grana) 50 MCG/ACT nasal spray Two sprays into each nostril daily 09/04/11   [name], MD  methylphenidate (RITALIN) 10 MG tablet Take 20 mg by mouth 3 (three) times daily.  01/25/12 02/24/12  [name], MD     Review of Systems  Positive ROS: As above  All other systems have been reviewed and were otherwise negative with the exception of those mentioned in the HPI and as above.  Objective: Vital signs in last 24 hours: Temp:  [98.3 F (36.8 C)] 98.3 F (36.8 C) (07/11 0649) Pulse Rate:  [112] 112  (07/11 0649) Resp:  [20] 20  (07/11 0649) BP: (153)/(77) 153/77 mmHg (07/11 0649) SpO2:  [95 %] 95 % (07/11 0649)  General Appearance: Alert, cooperative, no distress, appears stated age Head: Normocephalic, without obvious abnormality, atraumatic Eyes: PERRL, conjunctiva/corneas clear, EOM's intact, fundi benign, both eyes      Ears: Normal TM's and external ear canals, both ears Throat: Lips, mucosa, and tongue normal; teeth and gums normal Neck: Supple, symmetrical, trachea midline, no adenopathy; thyroid: No enlargement/tenderness/nodules; no carotid bruit or JVD Back: Symmetric, no curvature, ROM normal, no CVA tenderness Lungs: Clear to auscultation bilaterally, respirations unlabored Heart: Regular rate and rhythm, S1 and S2 normal, no murmur, rub or gallop Abdomen: Soft, non-tender, bowel sounds active all four quadrants, no masses, no organomegaly Extremities: Extremities normal, atraumatic, no cyanosis or edema Pulses: 2+ and symmetric all extremities Skin: Skin color, texture, turgor normal, no rashes or lesions  NEUROLOGIC:   Mental status: alert and oriented, no aphasia, good attention span, Fund of knowledge/ memory ok Motor Exam - grossly  normal Sensory Exam - grossly normal Reflexes:  Coordination - grossly normal Gait - grossly normal Balance - grossly normal Cranial Nerves: I: smell Not tested  II: visual acuity  OS: Normal    OD: Normal   II: visual fields Full to confrontation  II: pupils Equal, round, reactive to light  III,VII: ptosis None  III,IV,VI: extraocular muscles  Full ROM  V: mastication Normal  V: facial light touch sensation  Normal  V,VII: corneal reflex  Present  VII: facial muscle function - upper  Normal  VII: facial muscle function - lower Normal  VIII: hearing Not tested  IX: soft palate elevation  Normal  IX,X: gag reflex Present  XI: trapezius strength  5/5  XI: sternocleidomastoid strength 5/5  XI: neck flexion strength  5/5  XII: tongue strength  Normal    Data Review Lab Results  Component Value Date   WBC 7.2 04/26/2012   HGB 12.9 04/26/2012   HCT  39.1 04/26/2012   MCV 78.5 04/26/2012   PLT 333 04/26/2012   Lab Results  Component Value Date   NA 132* 04/26/2012   K 3.6 04/26/2012   CL 92* 04/26/2012   CO2 23 04/26/2012   BUN 14 04/26/2012   CREATININE 0.64 04/26/2012   GLUCOSE 479* 04/26/2012   No results found for this basename: INR, PROTIME    Assessment/Plan: C3-4, C4-5, and C5-6 spondylosis, stenosis, cervical radiculopathy/myelopathy, cervicalgia: I discussed the situation with the patient. I reviewed her MR scan with her and pointed out the abnormalities. We have discussed the various treatment options including surgery. I have described the surgical option of a C3-4, C4-5, and C5-6 anterior cervicectomy, fusion, and plating. I have described the surgery to her. I've shown her surgical models. We have discussed the risks, benefits, alternatives and likelihood of achieving our goals with surgery. I have answered all her questions. She wants to proceed with the surgery.   Lanard Arguijo D 04/28/2012 9:52 AM

## 2012-04-29 ENCOUNTER — Encounter (HOSPITAL_COMMUNITY): Payer: Self-pay | Admitting: Neurosurgery

## 2012-04-29 LAB — GLUCOSE, CAPILLARY
Glucose-Capillary: 199 mg/dL — ABNORMAL HIGH (ref 70–99)
Glucose-Capillary: 267 mg/dL — ABNORMAL HIGH (ref 70–99)
Glucose-Capillary: 324 mg/dL — ABNORMAL HIGH (ref 70–99)
Glucose-Capillary: 177 mg/dL — ABNORMAL HIGH (ref 70–99)

## 2012-04-29 MED ORDER — INSULIN ASPART 100 UNIT/ML ~~LOC~~ SOLN
0.0000 [IU] | Freq: Three times a day (TID) | SUBCUTANEOUS | Status: DC
  Administered 2012-04-29 – 2012-04-30 (×2): 7 [IU] via SUBCUTANEOUS

## 2012-04-29 MED ORDER — PNEUMOCOCCAL VAC POLYVALENT 25 MCG/0.5ML IJ INJ: 0.5000 mL | INJECTION | INTRAMUSCULAR | Status: DC

## 2012-04-29 MED ORDER — DIAZEPAM 5 MG PO TABS: 5.0000 mg | ORAL_TABLET | Freq: Four times a day (QID) | ORAL | Status: AC | PRN

## 2012-04-29 MED ORDER — OXYCODONE-ACETAMINOPHEN 5-325 MG PO TABS: 1.0000 | ORAL_TABLET | ORAL | Status: AC | PRN

## 2012-04-29 MED ORDER — DSS 100 MG PO CAPS: 100.0000 mg | ORAL_CAPSULE | Freq: Two times a day (BID) | ORAL | Status: AC

## 2012-04-29 MED ORDER — INSULIN ASPART 100 UNIT/ML ~~LOC~~ SOLN: 0.0000 [IU] | Freq: Every day | SUBCUTANEOUS | Status: DC

## 2012-04-29 MED ORDER — PNEUMOCOCCAL VAC POLYVALENT 25 MCG/0.5ML IJ INJ
0.5000 mL | INJECTION | Freq: Once | INTRAMUSCULAR | Status: AC
  Administered 2012-04-29: 0.5 mL via INTRAMUSCULAR
  Filled 2012-04-29: qty 0.5

## 2012-04-29 NOTE — Plan of Care (Signed)
Problem: Consults Goal: Diagnosis - Spinal Surgery Outcome: Completed/Met Date Met:  04/29/12 Cervical Spine Fusion     

## 2012-04-29 NOTE — Progress Notes (Signed)
Patient ID: [name], female   DOB: 04-Oct-1951, 61 y.o.   MRN: [id number] Subjective:  The patient is alert and pleasant. She looks well. She is in no apparent distress. She is pleased that her hand numbness has improved since surgery.  Objective: Vital signs in last 24 hours: Temp:  [98.5 F (36.9 C)-99.2 F (37.3 C)] 98.7 F (37.1 C) (07/12 0400) Pulse Rate:  [96-111] 111  (07/12 0400) Resp:  [14-23] 18  (07/12 0400) BP: (131-158)/(59-90) 156/81 mmHg (07/12 0400) SpO2:  [94 %-98 %] 96 % (07/12 0400) FiO2 (%):  [2 %-5 %] 2 % (07/11 1545)  Intake/Output from previous day: 07/11 0701 - 07/12 0700 In: 2517.5 [I.V.:2187.5] Out: 2300 [Urine:1925; Drains:75; Blood:300] Intake/Output this shift: Total I/O In: -  Out: 1610 [Urine:1550; Drains:60]  Physical exam the patient is alert and oriented x3. Her motor strength is grossly normal all 4 extremities including her deltoid. Her dressing is clean and dry. Her incision is flat there is no evidence of hematoma or shift.  Lab Results:  Basename 04/26/12 1425  WBC 7.2  HGB 12.9  HCT 39.1  PLT 333   BMET  Basename 04/26/12 1425  NA 132*  K 3.6  CL 92*  CO2 23  GLUCOSE 479*  BUN 14  CREATININE 0.64  CALCIUM 10.0    Studies/Results: Dg Cervical Spine 2-3 Views  04/28/2012  *RADIOLOGY REPORT*  Clinical Data: 61 year old female undergoing cervical spine surgery.  CERVICAL SPINE - 2-3 VIEW  Comparison: Cervical MRI 03/22/2012.  Findings: Two intraoperative cross-table lateral views of the cervical spine.  Film #1 at 1055 hours.  Surgical probe directed the C3-C4 disc space.  Film #2 the 1325 hours.  ACDF hardware at C3-C4, C4-C5, and extending caudally from C5 (under penetrated).  IMPRESSION: Cervical ACDF depicted as above.  Original Report Authenticated By: Harley Hallmark, M.D.    Assessment/Plan: Postop day 1: The patient is doing well. Her drain has put out 75 cc so far. We will continue the drain today and plan to send her  home tomorrow. I have given her her discharge instructions and answered all her questions. Her prescriptions are in the chart.  LOS: 1 day     Rifka Ramey D 04/29/2012, 6:49 AM

## 2012-04-29 NOTE — Progress Notes (Signed)
UR COMPLETED  

## 2012-04-30 LAB — GLUCOSE, CAPILLARY: Glucose-Capillary: 227 mg/dL — ABNORMAL HIGH (ref 70–99)

## 2012-04-30 NOTE — Discharge Summary (Signed)
  Physician Discharge Summary  Patient ID: [name] MRN: [id number] DOB/AGE: 1951/02/07 61 y.o.  Admit date: 04/28/2012 Discharge date: 04/30/2012  Admission Diagnoses: Cervical radiculopathy and stenosis and HNP  Discharge Diagnoses:  Principal Problem:  *Cervical spondylosis with myelopathy   Discharged Condition: good  Hospital Course: Patient is admitted hospital underwent the aforementioned procedure of an ACDF at C6-7 postoperatively patient did very well recovered in the floor on the floor patient patient was convalescing well and living and voiding spontaneously tolerating regular diet her drain output was minimal this was DC'd and patient still be discharged home.  Consults: Significant Diagnostic Studies: Treatments: ACDF C6-7 Discharge Exam: Blood pressure 142/74, pulse 99, temperature 99 F (37.2 C), temperature source Oral, resp. rate 16, SpO2 99.00%. Strength out of 5 wound clean and dry  Disposition: Home   Medication List  As of 04/30/2012  8:23 AM   STOP taking these medications         aspirin 81 MG tablet      HYDROcodone-acetaminophen 7.5-325 MG per tablet      methocarbamol 750 MG tablet      naproxen 500 MG tablet         TAKE these medications         ACCU-CHEK SMARTVIEW test strip   Generic drug: glucose blood   Test 1 to 2 times per day      amLODipine-benazepril 10-20 MG per capsule   Commonly known as: LOTREL   Take 1 capsule by mouth daily.      diazepam 5 MG tablet   Commonly known as: VALIUM   Take 1 tablet (5 mg total) by mouth every 6 (six) hours as needed.      DSS 100 MG Caps   Take 100 mg by mouth 2 (two) times daily.      fluticasone 50 MCG/ACT nasal spray   Commonly known as: FLONASE   Two sprays into each nostril daily      GLIPIZIDE XL 10 MG 24 hr tablet   Generic drug: glipiZIDE      hydrochlorothiazide 25 MG tablet   Commonly known as: HYDRODIURIL   Take 1 tablet (25 mg total) by mouth daily.     metFORMIN 500 MG 24 hr tablet   Commonly known as: GLUCOPHAGE-XR   Take 2 tablets (1,000 mg total) by mouth 2 (two) times daily.      methylphenidate 10 MG tablet   Commonly known as: RITALIN   Take 20 mg by mouth 3 (three) times daily.      methylphenidate 10 MG tablet   Commonly known as: RITALIN   Take 20 mg by mouth 3 (three) times daily.      methylphenidate 10 MG tablet   Commonly known as: RITALIN   Take 1 tablet (10 mg total) by mouth 2 (two) times daily. Fill on or after May 07, 2012.   Start taking on: 05/07/2012      Omeprazole 20 MG Tbec   Take 20 mg by mouth daily as needed. For acid reflux      oxyCODONE-acetaminophen 5-325 MG per tablet   Commonly known as: PERCOCET   Take 1-2 tablets by mouth every 4 (four) hours as needed.      triamcinolone cream 0.1 %   Commonly known as: KENALOG   Apply topically 2 (two) times daily.             Signed: Tuwanna Krausz P 04/30/2012, 8:23 AM

## 2012-04-30 NOTE — Progress Notes (Signed)
Subjective: Patient reports Feeling much better significant improvement in her arms very minimal leg pain minimal swelling difficult  Objective: Vital signs in last 24 hours: Temp:  [97.6 F (36.4 C)-99 F (37.2 C)] 99 F (37.2 C) (07/13 0400) Pulse Rate:  [99-118] 99  (07/13 0400) Resp:  [16-18] 16  (07/13 0400) BP: (142-165)/(48-82) 142/74 mmHg (07/13 0400) SpO2:  [94 %-99 %] 99 % (07/13 0400)  Intake/Output from previous day: 07/12 0701 - 07/13 0700 In: 480 [P.O.:480] Out: 45 [Drains:45] Intake/Output this shift:    Thank out of 5 wound clean and dry  Lab Results: No results found for this basename: WBC:2,HGB:2,HCT:2,PLT:2 in the last 72 hours BMET No results found for this basename: NA:2,K:2,CL:2,CO2:2,GLUCOSE:2,BUN:2,CREATININE:2,CALCIUM:2 in the last 72 hours  Studies/Results: Dg Cervical Spine 2-3 Views  04/28/2012  *RADIOLOGY REPORT*  Clinical Data: 61 year old female undergoing cervical spine surgery.  CERVICAL SPINE - 2-3 VIEW  Comparison: Cervical MRI 03/22/2012.  Findings: Two intraoperative cross-table lateral views of the cervical spine.  Film #1 at 1055 hours.  Surgical probe directed the C3-C4 disc space.  Film #2 the 1325 hours.  ACDF hardware at C3-C4, C4-C5, and extending caudally from C5 (under penetrated).  IMPRESSION: Cervical ACDF depicted as above.  Original Report Authenticated By: Harley Hallmark, M.D.    Assessment/Plan: Discharge home  LOS: 2 days     Audry Pecina P 04/30/2012, 8:22 AM

## 2012-05-20 DIAGNOSIS — M5030 Other cervical disc degeneration, unspecified cervical region: Secondary | ICD-10-CM | POA: Diagnosis not present

## 2012-08-08 ENCOUNTER — Ambulatory Visit (INDEPENDENT_AMBULATORY_CARE_PROVIDER_SITE_OTHER): Payer: Medicare Other | Admitting: Family Medicine

## 2012-08-08 VITALS — BP 179/81 | HR 116 | Temp 99.9°F | Ht 62.0 in | Wt 207.0 lb

## 2012-08-08 DIAGNOSIS — Z23 Encounter for immunization: Secondary | ICD-10-CM | POA: Diagnosis not present

## 2012-08-08 DIAGNOSIS — E119 Type 2 diabetes mellitus without complications: Secondary | ICD-10-CM | POA: Diagnosis not present

## 2012-08-08 DIAGNOSIS — I10 Essential (primary) hypertension: Secondary | ICD-10-CM | POA: Diagnosis not present

## 2012-08-08 MED ORDER — AMLODIPINE BESY-BENAZEPRIL HCL 10-20 MG PO CAPS: 1.0000 | ORAL_CAPSULE | Freq: Every day | ORAL | Status: DC

## 2012-08-08 MED ORDER — HYDROCHLOROTHIAZIDE 25 MG PO TABS: 25.0000 mg | ORAL_TABLET | Freq: Every day | ORAL | Status: DC

## 2012-08-08 MED ORDER — METFORMIN HCL ER 500 MG PO TB24: 1000.0000 mg | ORAL_TABLET | Freq: Two times a day (BID) | ORAL | Status: DC

## 2012-08-08 MED ORDER — METHOCARBAMOL 750 MG PO TABS: 750.0000 mg | ORAL_TABLET | Freq: Four times a day (QID) | ORAL | Status: DC

## 2012-08-08 MED ORDER — GLIPIZIDE ER 10 MG PO TB24: 10.0000 mg | ORAL_TABLET | Freq: Every day | ORAL | Status: DC

## 2012-08-08 MED ORDER — TRIAMCINOLONE ACETONIDE 0.1 % EX CREA: TOPICAL_CREAM | Freq: Two times a day (BID) | CUTANEOUS | Status: DC

## 2012-08-08 MED ORDER — NAPROXEN 500 MG PO TABS: 500.0000 mg | ORAL_TABLET | Freq: Two times a day (BID) | ORAL | Status: DC

## 2012-08-08 MED ORDER — HYDROCODONE-ACETAMINOPHEN 7.5-500 MG PO TABS: 1.0000 | ORAL_TABLET | Freq: Four times a day (QID) | ORAL | Status: DC | PRN

## 2012-08-08 MED ORDER — OMEPRAZOLE 20 MG PO TBEC: 20.0000 mg | DELAYED_RELEASE_TABLET | Freq: Every day | ORAL | Status: DC | PRN

## 2012-08-08 MED ORDER — FLUTICASONE PROPIONATE 50 MCG/ACT NA SUSP: NASAL | Status: DC

## 2012-08-08 NOTE — Patient Instructions (Addendum)
x

## 2012-08-09 ENCOUNTER — Encounter: Payer: Self-pay | Admitting: Family Medicine

## 2012-08-09 NOTE — Assessment & Plan Note (Signed)
Lab Results  Component Value Date   HGBA1C 7.1 08/08/2012  Adequate glycemic control. Tolerating medications.  No new end-organ damage.  Continue current medications.

## 2012-08-09 NOTE — Progress Notes (Signed)
Patient ID: [name], female   DOB: 1951/09/05, 61 y.o.   MRN: [id number]  Subjective:    Patient ID: [name], female    DOB: 10-13-1951, 61 y.o.   MRN: [id number]  HPI HYPERTENSION  Disease Monitoring:  Blood pressure range-<140/90 at home Chest pain- none Dyspnea- none  Medications:  Compliance- stopped carvedilol as recommended. Continues on Lotrel and HCTZ daily Lightheadedness- none Edema- none  DIABETES  Disease Monitoring:  Blood Sugar ranges-100 to 200 range Polyuria- no New Visual problems- no  Medications:  Compliance- taking metformin 2000 mg daily, taking glipizide xl daily Hypoglycemic symptoms- none  No formal exercise.  Cervical Stenosis s/p laminectomy Improved arm symptoms and neck pain since Dr York Ram decompressive surgery  Review of SystemsSee HPI     Objective:   Physical Exam  Vitals reviewed. Constitutional: No distress.       obese  Cardiovascular: Normal rate, regular rhythm, normal heart sounds and intact distal pulses.   No murmur heard. Pulmonary/Chest: Effort normal and breath sounds normal.  Musculoskeletal:           Assessment & Plan:

## 2012-08-09 NOTE — Assessment & Plan Note (Signed)
Ran out of her Lotrel medications. Inadequate control of BP. No evidence of new end organ damage.  Plan: restart lotrel.  Continue HCTZ

## 2012-08-23 DIAGNOSIS — M5030 Other cervical disc degeneration, unspecified cervical region: Secondary | ICD-10-CM | POA: Diagnosis not present

## 2012-09-29 ENCOUNTER — Telehealth: Payer: Self-pay | Admitting: Family Medicine

## 2012-09-29 NOTE — Telephone Encounter (Signed)
Needs a referral for a diagnostic bilateral breast exam - needs it faxed to [facility] - fax [phone] pls let her know this has been done

## 2012-10-05 NOTE — Telephone Encounter (Signed)
Pt is needing this done asap - also needs to add SS# [id number]

## 2012-10-25 ENCOUNTER — Other Ambulatory Visit: Payer: Self-pay | Admitting: Family Medicine

## 2012-10-27 ENCOUNTER — Ambulatory Visit (INDEPENDENT_AMBULATORY_CARE_PROVIDER_SITE_OTHER): Payer: Medicare Other | Admitting: Family Medicine

## 2012-10-27 VITALS — BP 144/83 | HR 83 | Temp 99.3°F | Ht 62.0 in | Wt 209.0 lb

## 2012-10-27 DIAGNOSIS — F988 Other specified behavioral and emotional disorders with onset usually occurring in childhood and adolescence: Secondary | ICD-10-CM | POA: Diagnosis not present

## 2012-10-27 DIAGNOSIS — M4712 Other spondylosis with myelopathy, cervical region: Secondary | ICD-10-CM

## 2012-10-27 DIAGNOSIS — Z9189 Other specified personal risk factors, not elsewhere classified: Secondary | ICD-10-CM | POA: Diagnosis not present

## 2012-10-27 DIAGNOSIS — I10 Essential (primary) hypertension: Secondary | ICD-10-CM

## 2012-10-27 DIAGNOSIS — Z87898 Personal history of other specified conditions: Secondary | ICD-10-CM

## 2012-10-27 DIAGNOSIS — E119 Type 2 diabetes mellitus without complications: Secondary | ICD-10-CM

## 2012-10-27 LAB — POCT GLYCOSYLATED HEMOGLOBIN (HGB A1C): Hemoglobin A1C: 10.6

## 2012-10-27 MED ORDER — GLUCOSE BLOOD VI STRP: 1.0000 | ORAL_STRIP | Freq: Every day | Status: DC

## 2012-10-27 MED ORDER — METHYLPHENIDATE HCL 10 MG PO TABS: 20.0000 mg | ORAL_TABLET | Freq: Three times a day (TID) | ORAL | Status: DC

## 2012-10-27 NOTE — Patient Instructions (Signed)
A1C was up to 10.6%.  I agree with returning to metformin 1000 mg twice a day  Go ffor an eye exam.   Consider silver sneakers program at [facility].

## 2012-10-28 ENCOUNTER — Encounter: Payer: Self-pay | Admitting: Family Medicine

## 2012-10-28 DIAGNOSIS — Z96659 Presence of unspecified artificial knee joint: Secondary | ICD-10-CM

## 2012-10-28 HISTORY — DX: Presence of unspecified artificial knee joint: Z96.659

## 2012-10-28 NOTE — Progress Notes (Signed)
  Subjective:    Patient ID: [name], female    DOB: 04/20/1951, 62 y.o.   MRN: [id number]  HPI  HYPERTENSION Disease Monitoring: Blood pressure range-not checking at home Chest pain- no chest pain palpitations- no        Dyspnea- no Medications: yes Compliance-  Lightheadedness,Syncope- no   Edema- no  DIABETES Disease Monitoring: Blood Sugar ranges-has not been checking because of running out of test strips Polyuria/phagia/dipsia- no      Visual problems- no Medications: Compliance- taking self-initiated reduction of her Metformin to 1000 mg dailyfrom 2000 mg a day, taking Glucotrol XL 10 mg daily Hypoglycemic symptoms- Was having sense of low sugars, that is why she reduced her metformin Has not gone for ophthalmologic exam but planning on it.   HYPERLIPIDEMIA Disease Monitoring: See symptoms for Hypertension Medications: Compliance- yes, taking atovastatin 40 mg daily Right upper quadrant pain- no  Muscle aches- no  ROS See HPI above   PMH Smoking Status noted  Lab Review   Potassium  Date Value Range Status  04/26/2012 3.6  3.5 - 5.1 mEq/L Final     Sodium  Date Value Range Status  04/26/2012 132* 135 - 145 mEq/L Final     Creat  Date Value Range Status  05/25/2011 0.83  0.50 - 1.10 mg/dL Final     Creatinine, Ser  Date Value Range Status  04/26/2012 0.64  0.50 - 1.10 mg/dL Final      Review of Systems     Objective:   Physical Exam VS reviewed GEN: Alert, Cooperative, Groomed, NAD COR: RRR, No M/G/R, No JVD, Normal PMI size and location LUNGS: BCTA, No Acc mm use, speaking in full sentences EXT: No peripheral ankle edema Gait: Normal speed, No significant path deviation, Step through +  Psych: Normal affect/thought/speech/language     Assessment & Plan:

## 2012-10-28 NOTE — Assessment & Plan Note (Signed)
Tolerating methylphenidate without adverse effects.  Pt continues to feel medication improves quality of her life by helping her complete daily tasks for herself and her family. No abbarent drug seeking/taking behaviors displayed.  Refill of Methylphenidate 20 mg THREE TIMES DAILY with Two month supply with one refill provided to patient to take to [facility] [employer].

## 2012-10-28 NOTE — Assessment & Plan Note (Signed)
Pt given handwritten prescription to take to [facility] requesting a Diagnostic Mammogram for follow up of her history of abnormal mammogram with benign bx pathology findings.

## 2012-10-28 NOTE — Assessment & Plan Note (Addendum)
Lab Results  Component Value Date   HGBA1C 10.6 10/27/2012   Inadequate glycemic control.  Likely due to pt-initiated reduction in metformin daily dose. Pt amenable to returning to metformin 1000 mg TWICE DAILY and continue Glucotrol XL 10 mg daily.  In future, should patient want to self-initiate reduction in medication because of concerns for medication-induced hypoglycemia, recommended that she reduce the Glucotrol daily dose rather than the metformin daily dose. RTC in April 14 for recheck.

## 2012-10-28 NOTE — Assessment & Plan Note (Signed)
Adequate blood pressure control.  No evidence of new end organ damage.  Tolerating medication without significant adverse effects.  Plan to continue current blood pressure regiment.   

## 2012-12-16 ENCOUNTER — Ambulatory Visit (INDEPENDENT_AMBULATORY_CARE_PROVIDER_SITE_OTHER): Payer: Medicare Other | Admitting: Family Medicine

## 2012-12-16 ENCOUNTER — Encounter: Payer: Self-pay | Admitting: Family Medicine

## 2012-12-16 VITALS — BP 145/82 | HR 130 | Temp 98.7°F | Ht 62.0 in | Wt 209.0 lb

## 2012-12-16 DIAGNOSIS — J019 Acute sinusitis, unspecified: Secondary | ICD-10-CM | POA: Diagnosis not present

## 2012-12-16 MED ORDER — AZITHROMYCIN 250 MG PO TABS: ORAL_TABLET | ORAL | Status: DC

## 2012-12-16 MED ORDER — BENZONATATE 100 MG PO CAPS: 100.0000 mg | ORAL_CAPSULE | Freq: Two times a day (BID) | ORAL | Status: DC | PRN

## 2012-12-16 NOTE — Patient Instructions (Signed)

## 2012-12-16 NOTE — Assessment & Plan Note (Signed)
Time frame is not consistent with acute sinusitis, but patient's symptoms as well as prior sinusitis suggestive of infection. She has allergic rhinitis and is tender on exam. She states Z-pak works well for her and is requesting that today. She should continue steroid nasal spray. She can take Benadryl if needed for decongestant but advised to avoid Sudafed as this is the likely cause of her BP and HR elevation today. Also given Tessalon for her persistent post-nasal drip cough, which she should take at night if needed to sleep. Return in 2 weeks if not fully resolved, or otherwise follow up as needed.

## 2012-12-16 NOTE — Progress Notes (Signed)
  Subjective:     [name] is a 62 y.o. female who presents for evaluation of sinus pain. Symptoms include: cough, facial pain, fevers, headaches, nasal congestion and post nasal drip. Onset of symptoms was 3 days ago. Symptoms have been gradually worsening since that time. Past history is significant for no history of pneumonia or bronchitis. Patient is a former smoker, quit 35 years ago. She states she gets sinusitis about once per year. This current episode changed today when her mucus got thicker and was blood tinged. Her cough is from post-nasal drip.  The following portions of the patient's history were reviewed and updated as appropriate: allergies, current medications, past family history, past medical history, past social history, past surgical history and problem list.  Review of Systems Pertinent items are noted in HPI.   Objective:    BP 145/82  Pulse 130  Temp(Src) 98.7 F (37.1 C) (Oral)  Ht 5\' 2"  (1.575 m)  Wt 209 lb (94.802 kg)  BMI 38.22 kg/m2 General appearance: alert, cooperative and ill appearing Head: Normocephalic, without obvious abnormality, atraumatic, sinuses tender to palpation Eyes: conjunctivae/corneas clear. PERRL, EOM's intact. Fundi benign. Ears: normal TM and external ear canal left ear and abnormal TM right ear - fluid bubbles behind TM Nose: mild congestion, sinus tenderness bilateral Throat: abnormal findings: marked oropharyngeal erythema and cobblestoning Neck: no adenopathy, no carotid bruit, no JVD, supple, symmetrical, trachea midline and thyroid not enlarged, symmetric, no tenderness/mass/nodules Lungs: clear to auscultation bilaterally Heart: regular rate and rhythm, S1, S2 normal, no murmur, click, rub or gallop    Assessment:    Acute sinusitis.    Plan:    Nasal steroids per medication orders. Antihistamines per medication orders. Azithromycin per medication orders. Follow up in 2 weeks or as needed.

## 2013-01-09 ENCOUNTER — Other Ambulatory Visit: Payer: Self-pay | Admitting: Family Medicine

## 2013-01-09 ENCOUNTER — Other Ambulatory Visit: Payer: Self-pay | Admitting: *Deleted

## 2013-01-09 MED ORDER — HYDROCODONE-ACETAMINOPHEN 7.5-325 MG PO TABS: 1.0000 | ORAL_TABLET | Freq: Four times a day (QID) | ORAL | Status: DC | PRN

## 2013-01-09 MED ORDER — BENZONATATE 100 MG PO CAPS: 100.0000 mg | ORAL_CAPSULE | Freq: Two times a day (BID) | ORAL | Status: DC | PRN

## 2013-01-10 NOTE — Progress Notes (Signed)
Rx called in verbally and pt informed. Fleeger, [name]

## 2013-02-16 ENCOUNTER — Ambulatory Visit (INDEPENDENT_AMBULATORY_CARE_PROVIDER_SITE_OTHER): Payer: Medicare Other | Admitting: Family Medicine

## 2013-02-16 VITALS — BP 156/81 | HR 111 | Wt 214.0 lb

## 2013-02-16 DIAGNOSIS — I10 Essential (primary) hypertension: Secondary | ICD-10-CM | POA: Diagnosis not present

## 2013-02-16 DIAGNOSIS — E119 Type 2 diabetes mellitus without complications: Secondary | ICD-10-CM | POA: Diagnosis not present

## 2013-02-16 MED ORDER — GLIPIZIDE ER 10 MG PO TB24: 20.0000 mg | ORAL_TABLET | Freq: Every day | ORAL | Status: DC

## 2013-02-16 NOTE — Patient Instructions (Addendum)
Increase Glipizide XL to two tablets in the morning to treat your elevated A1C of 9.3% Continue your metformin 1000 mg twice a day  Restart your walking exercise, 30 to 40 minutes a day, most days out of the week.   We will recheck your A1C in 3 months to see how your blood sugar control has improved.

## 2013-02-17 ENCOUNTER — Encounter: Payer: Self-pay | Admitting: Family Medicine

## 2013-02-17 NOTE — Assessment & Plan Note (Signed)
Lab Results  Component Value Date   HGBA1C 9.4 02/16/2013   Improved with reintroduction of metformin to 1000 mg twice daily.  [name] planning on reintroducing walking regiment Recommend she increase her Glucotrol XL 10 mg to two tablets daily from one daily. RTC 3 months for recheck./

## 2013-02-17 NOTE — Progress Notes (Signed)
  Subjective:    Patient ID: [name], female    DOB: [birth year], 62 y.o.   MRN: [id number]  HPI  HYPERTENSION Disease Monitoring: Blood pressure range-not checking at home Chest pain- no chest pain palpitations- no        Dyspnea- no Medications: yes Compliance-  Lightheadedness,Syncope- no   Edema- no  DIABETES Disease Monitoring: Blood Sugar ranges-has not been checking because of running out of test strips Polyuria/phagia/dipsia- no      Visual problems- no Medications: Compliance- taking metformin 1000 mg twice a day, taking Glucotrol XL 10 mg daily Hypoglycemic symptoms- None reduced her metformin     ROS See HPI above   PMH Smoking Status noted   Review of Systems     Objective:   Physical Exam VS reviewed GEN: Alert, Cooperative, Groomed, NAD COR: RRR, No M/G/R, No JVD, Normal PMI size and location LUNGS: BCTA, No Acc mm use, speaking in full sentences EXT: No peripheral ankle edema Gait: Normal speed, No significant path deviation, Step through +  Psych: Normal affect/thought/speech/language     Assessment & Plan:

## 2013-02-17 NOTE — Assessment & Plan Note (Signed)
Inadequate BP control.  No evidence of new end organ damage. Tolerating medications.  Pt planning on reintroducing walking into life in order to address weight gain and rise in A1c.  Will see how BP responds to this before starting change in antihypertsnive regiment.  RTC in 3 months.

## 2013-02-20 ENCOUNTER — Other Ambulatory Visit: Payer: Self-pay | Admitting: Family Medicine

## 2013-02-20 MED ORDER — HYDROCODONE-ACETAMINOPHEN 7.5-325 MG PO TABS: 1.0000 | ORAL_TABLET | Freq: Four times a day (QID) | ORAL | Status: DC | PRN

## 2013-04-27 ENCOUNTER — Other Ambulatory Visit: Payer: Self-pay

## 2013-06-05 ENCOUNTER — Encounter: Payer: Self-pay | Admitting: Family Medicine

## 2013-06-05 ENCOUNTER — Ambulatory Visit (INDEPENDENT_AMBULATORY_CARE_PROVIDER_SITE_OTHER): Payer: Medicare Other | Admitting: Family Medicine

## 2013-06-05 VITALS — BP 155/85 | HR 135 | Temp 99.9°F | Wt 209.0 lb

## 2013-06-05 DIAGNOSIS — R7309 Other abnormal glucose: Secondary | ICD-10-CM

## 2013-06-05 DIAGNOSIS — E7800 Pure hypercholesterolemia, unspecified: Secondary | ICD-10-CM

## 2013-06-05 DIAGNOSIS — I10 Essential (primary) hypertension: Secondary | ICD-10-CM | POA: Diagnosis not present

## 2013-06-05 DIAGNOSIS — Z79899 Other long term (current) drug therapy: Secondary | ICD-10-CM

## 2013-06-05 DIAGNOSIS — E119 Type 2 diabetes mellitus without complications: Secondary | ICD-10-CM

## 2013-06-05 DIAGNOSIS — R739 Hyperglycemia, unspecified: Secondary | ICD-10-CM

## 2013-06-05 LAB — COMPREHENSIVE METABOLIC PANEL WITH GFR
ALT: 49 U/L — ABNORMAL HIGH (ref 0–35)
AST: 43 U/L — ABNORMAL HIGH (ref 0–37)
Albumin: 4.3 g/dL (ref 3.5–5.2)
Alkaline Phosphatase: 73 U/L (ref 39–117)
BUN: 13 mg/dL (ref 6–23)
CO2: 24 meq/L (ref 19–32)
Calcium: 9.9 mg/dL (ref 8.4–10.5)
Chloride: 98 meq/L (ref 96–112)
Creat: 0.77 mg/dL (ref 0.50–1.10)
Glucose, Bld: 263 mg/dL — ABNORMAL HIGH (ref 70–99)
Potassium: 3.7 meq/L (ref 3.5–5.3)
Sodium: 134 meq/L — ABNORMAL LOW (ref 135–145)
Total Bilirubin: 0.4 mg/dL (ref 0.3–1.2)
Total Protein: 7.3 g/dL (ref 6.0–8.3)

## 2013-06-05 LAB — CBC WITH DIFFERENTIAL/PLATELET
Basophils Absolute: 0 10*3/uL (ref 0.0–0.1)
Basophils Relative: 1 % (ref 0–1)
Hemoglobin: 12.9 g/dL (ref 12.0–15.0)
Lymphs Abs: 2.1 10*3/uL (ref 0.7–4.0)
MCH: 26.1 pg (ref 26.0–34.0)
Monocytes Relative: 7 % (ref 3–12)
Neutro Abs: 3.4 10*3/uL (ref 1.7–7.7)
Neutrophils Relative %: 55 % (ref 43–77)
RBC: 4.94 MIL/uL (ref 3.87–5.11)

## 2013-06-05 LAB — TSH: TSH: 1.478 u[IU]/mL (ref 0.350–4.500)

## 2013-06-05 MED ORDER — SITAGLIPTIN PHOSPHATE 100 MG PO TABS: 100.0000 mg | ORAL_TABLET | Freq: Every day | ORAL | Status: DC

## 2013-06-05 NOTE — Patient Instructions (Signed)
Lab Results  Component Value Date   HGBA1C 10.2 06/05/2013   Our goal for your A1C is less than 8.0/ Try increasing your Metformin to two tablets in the morning and one tablet in the evening.  Start Januvia to help lower your blood sugar  Continue your Glipizide XL 2 tablets a day  Consult with Dr Raymondo Band [facility]) to see what options besides insulin we have to get your blood sugar under better control.  Keep diary of your home blood sugar measurements to show to Dr Raymondo Band when you consult with him.   Increase your weekly exercise to 150 minutes a week, or 22 minutes a day.

## 2013-06-06 ENCOUNTER — Encounter: Payer: Self-pay | Admitting: Family Medicine

## 2013-06-06 ENCOUNTER — Telehealth: Payer: Self-pay | Admitting: Family Medicine

## 2013-06-06 NOTE — Telephone Encounter (Signed)
Reported lab results. Discussed new slight elevation in transaminases. While this could be medication related, I suspect that it is from fatty liver in patient with uncontrolled glycemia. She is tolerating the new medication, Januvia, and the slightly increased dose of metformin to 500 mg THREE TIMES DAILY.  Pt to see Dr Raymondo Band next week.   Will recheck transaminases couple months after glycemic control re-established or if patient develops liver-related symptoms or signs.

## 2013-06-06 NOTE — Progress Notes (Signed)
Patient ID: [name], female   DOB: 1951/07/10, 62 y.o.   MRN: [id number]  Subjective:    Patient ID: [name], female    DOB: [birth year], 62 y.o.   MRN: [id number]  HPI  HYPERTENSION Disease Monitoring: Blood pressure range- at home BP running 130's/ 80's Chest pain- no chest pain palpitations- no        Dyspnea- no Medications: yes Compliance-  Lightheadedness,Syncope- no   Edema- no  DIABETES Disease Monitoring: Blood Sugar ranges-fastings in 200 range Polyuria/phagia/dipsia- no      Visual problems- no Medications: Compliance- taking self-initiated reduction of her Metformin to 1000 mg daily from 2000 mg a day because of UGI upset at 2000 mg which resolved with dose reduction, taking Glucotrol XL 20 mg daily Hypoglycemic symptoms- None Has not gone for ophthalmologic exam but planning on it.   HYPERLIPIDEMIA Disease Monitoring: See symptoms for Hypertension Medications: Compliance- yes, taking atovastatin 40 mg daily Right upper quadrant pain- no  Muscle aches- no  ROS See HPI above   PMH Smoking Status noted  Lab Review    Review of Systems     Objective:   Physical Exam VS reviewed GEN: Alert, Cooperative, Groomed, NAD COR: RRR, No M/G/R, No JVD, Normal PMI size and location LUNGS: BCTA, No Acc mm use, speaking in full sentences EXT: No peripheral ankle edema Gait: Normal speed, No significant path deviation, Step through +  Psych: Normal affect/thought/speech/language Feet: without lesions, (+)1 DP pulses bilaterally, intact monofilament touch 5/5 bilateral feet soles     Assessment & Plan:

## 2013-06-06 NOTE — Assessment & Plan Note (Signed)
Home BP measurements in 130's / 80's range.  No evidence of new end organ damage.  Taking and tolerating her medications. Continue current regiment.

## 2013-06-06 NOTE — Assessment & Plan Note (Addendum)
Lab Results  Component Value Date   HGBA1C 10.2 06/05/2013   Wt Readings from Last 3 Encounters:  06/05/13 209 lb (94.802 kg)  02/16/13 214 lb (97.07 kg)  12/16/12 209 lb (94.802 kg)   Inadequate glycemic control since at least 01/14.  Current antidiabetic regiment: Metformin 1000 mg daily and Glucotrol XL 10 mg two tab daily.   Uncertain why patient has lost glycemic control in last 6 to 9 months.  Lifestyle: [name] reports plans to institute home biking daily. She realizes need for improved diet and what she needs to do to improve her diet.  [name] is resistant to starting insulin therapy.  She is willing to start Sitagliptin 100 mg daily [name] is willing to try increasing her metformin to 500 mg three times a day She will continue her Glipizide XL 20 mg daily.  She is willing to meet with Dr Raymondo Band to discuss her other non-insulin options for glycemic control.  She is not interested in nutrition consultation at this time, but may be in the future.   Pt is to return to see me in 4 months.

## 2013-06-06 NOTE — Assessment & Plan Note (Signed)
Adequate dose statin which pt is tolerating. No new end-organ damage.  Continue current medications.

## 2013-06-09 ENCOUNTER — Encounter: Payer: Self-pay | Admitting: Pharmacist

## 2013-06-09 ENCOUNTER — Ambulatory Visit (INDEPENDENT_AMBULATORY_CARE_PROVIDER_SITE_OTHER): Payer: Medicare Other | Admitting: Pharmacist

## 2013-06-09 VITALS — BP 125/69 | HR 102 | Ht 62.0 in | Wt 211.0 lb

## 2013-06-09 DIAGNOSIS — E119 Type 2 diabetes mellitus without complications: Secondary | ICD-10-CM

## 2013-06-09 NOTE — Progress Notes (Signed)
S:    Patient arrives in good spirits.    She presents to the clinic for diabetes management and education.  Patient reports having diabetes for a long time, but recently it hasn't been under control. She reports since her back surgery 1 year ago her sugars have been high. Patient began taking Januvia 100mg  and increased her metformin XR dose to 1500mg  every day on Monday 06/05/13, per Dr. McDiarmid. She is tolerating the therapy well.    O:    A/P: Patient with long standing diabetes with uncontrolled levels since January 2014  Lab Results  Component Value Date   HGBA1C 10.2 06/05/2013  Home fasting CBG reading of 102 this morning and no readings over 155 since she began the new therapy on Monday. Denies recent  hypoglycemic events and is able to verbalize appropriate hypoglycemia management plan.  Reports adherence with medication and is tolerating it well. Patient is meeting goals for blood glucose control at this time. No change in therapy. Discussed the importance of diet and exercise. Patient plans to exercise on her exercise bike at least 3 days a week for 30 minutes. Discussed the next medication options if these therapies fail are likely injectable agents. This seems to be a motivator for the patient to be adherent with diet, medication, and exercise. If patient begins experiencing hypoglycemia: recommend decreasing Glipizide XR to 10 mg daily. Written patient instructions provided.  Follow up with Dr. Iona Coach and [facility].  Total time in face to face counseling 40 minutes.  Patient seen with Georga Kaufmann, PharmD Resident.

## 2013-06-09 NOTE — Patient Instructions (Addendum)
Thank you for coming today!  Your blood sugar is looking good. Continue to take your blood sugar every day but at different times of the day. Use your blood sugar logs if it helps.  Follow up Dr. McDiarmid for diabetes management.

## 2013-06-09 NOTE — Assessment & Plan Note (Signed)
Patient with long standing diabetes with uncontrolled levels since January 2014  Lab Results  Component Value Date   HGBA1C 10.2 06/05/2013  Home fasting CBG reading of 102 this morning and no readings over 155 since she began the new therapy on Monday. Denies recent  hypoglycemic events and is able to verbalize appropriate hypoglycemia management plan.  Reports adherence with medication and is tolerating it well. Patient is meeting goals for blood glucose control at this time. No change in therapy. Discussed the importance of diet and exercise. Patient plans to exercise on her exercise bike at least 3 days a week for 30 minutes. Discussed the next medication options if these therapies fail are likely injectable agents. This seems to be a motivator for the patient to be adherent with diet, medication, and exercise. If patient begins experiencing hypoglycemia: recommend decreasing Glipizide XR to 10 mg daily. Written patient instructions provided.  Follow up with Dr. Iona Coach and [facility].  Total time in face to face counseling 40 minutes.  Patient seen with Georga Kaufmann, PharmD Resident.

## 2013-06-20 NOTE — Progress Notes (Signed)
Patient ID: [name], female   DOB: 1951/09/16, 62 y.o.   MRN: [id number] Reviewed: Agree with Dr. Macky Lower documentation and management.

## 2013-06-22 ENCOUNTER — Telehealth: Payer: Self-pay | Admitting: Family Medicine

## 2013-06-22 DIAGNOSIS — E119 Type 2 diabetes mellitus without complications: Secondary | ICD-10-CM

## 2013-06-22 NOTE — Telephone Encounter (Signed)
Patient is calling asking for a written Rx or the Januvia so that she can take it to [facility] because it will be a lot less expensive and she is asking for a 90 day supply with refills.  She wants to come by and pick it up when it is ready so please call her when it is ready for pick up.

## 2013-06-22 NOTE — Telephone Encounter (Signed)
Will forward to MD. Marguita Venning,CMA  

## 2013-06-23 MED ORDER — SITAGLIPTIN PHOSPHATE 100 MG PO TABS: 100.0000 mg | ORAL_TABLET | Freq: Every day | ORAL | Status: DC

## 2013-06-23 NOTE — Telephone Encounter (Signed)
Message left for pt letting her know rx is ready .  Lurlie Wigen,CMA

## 2013-07-19 ENCOUNTER — Ambulatory Visit: Payer: Medicare Other

## 2013-07-26 ENCOUNTER — Telehealth: Payer: Self-pay | Admitting: *Deleted

## 2013-07-26 NOTE — Telephone Encounter (Signed)
[facility] for refill on Methocarbamol (90-day supply). Will forward to MD

## 2013-07-28 ENCOUNTER — Telehealth: Payer: Self-pay | Admitting: Family Medicine

## 2013-07-28 MED ORDER — HYDROCODONE-ACETAMINOPHEN 7.5-325 MG PO TABS: 1.0000 | ORAL_TABLET | Freq: Four times a day (QID) | ORAL | Status: DC | PRN

## 2013-07-28 NOTE — Telephone Encounter (Signed)
Please notify patient she will need to see Dr Levonne Lapping at an office visit before can get more refills Rx given to [facility]

## 2013-07-28 NOTE — Telephone Encounter (Signed)
Will forward to MD. Fain Francis,CMA  

## 2013-07-28 NOTE — Telephone Encounter (Signed)
Pt called and would like refill of her hydrocodone left up front for pickup. JW

## 2013-07-28 NOTE — Telephone Encounter (Signed)
Pt is aware.  [name],CMA  

## 2013-07-31 ENCOUNTER — Ambulatory Visit (INDEPENDENT_AMBULATORY_CARE_PROVIDER_SITE_OTHER): Payer: Medicare Other | Admitting: *Deleted

## 2013-07-31 DIAGNOSIS — Z23 Encounter for immunization: Secondary | ICD-10-CM | POA: Diagnosis not present

## 2013-08-10 NOTE — Telephone Encounter (Signed)
Med refilled on 08/08/13

## 2013-08-16 ENCOUNTER — Encounter: Payer: Self-pay | Admitting: Family Medicine

## 2013-08-18 ENCOUNTER — Telehealth: Payer: Self-pay | Admitting: Family Medicine

## 2013-08-18 DIAGNOSIS — E119 Type 2 diabetes mellitus without complications: Secondary | ICD-10-CM

## 2013-08-18 NOTE — Telephone Encounter (Signed)
Pt called to let Dr. McDiarmid know that she left out her request for refill on Januvia. She sent the message trough My Chart. Myriam Jacobson

## 2013-08-21 NOTE — Telephone Encounter (Signed)
On 06-23-13 had rx for 90 with 3 refills - should last her one year Please check with her Thanks LC

## 2013-08-21 NOTE — Telephone Encounter (Signed)
This is the email pt sent to mcdiarmid.  She is aware of her januvia rx.  States that she is switching to [facility] and wanted [place] to be aware.  States that she is low on this medication and will need it before her PCP gets back. Burnard Hawthorne        [facility] MESSAGE REPORT Message [[id number]]     From Sunday Corn   To [name], MD [P [phone]   Composed 08/16/2013 11:39 PM   For Delivery On 08/16/2013 11:39 PM   Subject Non-Urgent Medical Question   Message Type Patient Medical Advice Request   Read Status Y   Message Body I need to get written or printed prescriptions for most of my medications. I normally take these prescriptions to [facility] to have filled. The medications are:  Metformin-XR 500mg . 360 tablets  Naproxen 500mg  180 tablets  Lotrel 10/20mg  90 capsules  HCTZ 25mg  90 tablets  Methylphenidate 10mg  360 tablets  Call me at [phone].

## 2013-08-22 MED ORDER — SITAGLIPTIN PHOSPHATE 100 MG PO TABS: 100.0000 mg | ORAL_TABLET | Freq: Every day | ORAL | Status: DC

## 2013-08-22 NOTE — Telephone Encounter (Signed)
Left message for pt that rx for Alma Friendly is ready and message from [place].  Kazaria Gaertner,CMA

## 2013-08-22 NOTE — Telephone Encounter (Signed)
Spoke with patient and she states that her [facility] Friendly [facility] along with her hydrocodone, and methocarbamol.  She has 5 that need printing to take with her to [facility].  Metformin, naproxen, lotrel, hctz, and methylphenidate.  Jazmin Hartsell,CMA

## 2013-08-22 NOTE — Telephone Encounter (Signed)
Called and advised her If she needs all her medications refilled she will need an office visit too much risk of mis-prescribing, She can see Dr McDiarmid as scheduled or see me if she can not wait  Now reports she is out of her blood pressure pills lotrel and hctz "but she will just go without them for a month"  These need to be hand written so she can take them to [facility]  Informed her I would hand write these two but for any others she would need an office visit.    Please notify her she can pick up these two prescriptions.

## 2013-08-22 NOTE — Telephone Encounter (Signed)
Hand wrote Rx If she likes we can send in electronically if she can give [place] the name and number of [facility] Thanks LC

## 2013-08-23 NOTE — Telephone Encounter (Signed)
Please call patient regarding issue below.

## 2013-08-23 NOTE — Telephone Encounter (Signed)
[phone] 

## 2013-09-21 ENCOUNTER — Encounter: Payer: Self-pay | Admitting: Family Medicine

## 2013-09-21 ENCOUNTER — Ambulatory Visit (INDEPENDENT_AMBULATORY_CARE_PROVIDER_SITE_OTHER): Payer: Medicare Other | Admitting: Family Medicine

## 2013-09-21 VITALS — BP 140/68 | HR 116 | Temp 98.6°F | Wt 215.0 lb

## 2013-09-21 DIAGNOSIS — M1990 Unspecified osteoarthritis, unspecified site: Secondary | ICD-10-CM | POA: Diagnosis not present

## 2013-09-21 DIAGNOSIS — F988 Other specified behavioral and emotional disorders with onset usually occurring in childhood and adolescence: Secondary | ICD-10-CM

## 2013-09-21 DIAGNOSIS — I10 Essential (primary) hypertension: Secondary | ICD-10-CM

## 2013-09-21 DIAGNOSIS — J309 Allergic rhinitis, unspecified: Secondary | ICD-10-CM

## 2013-09-21 DIAGNOSIS — Z9189 Other specified personal risk factors, not elsewhere classified: Secondary | ICD-10-CM

## 2013-09-21 DIAGNOSIS — Z87898 Personal history of other specified conditions: Secondary | ICD-10-CM

## 2013-09-21 DIAGNOSIS — E7800 Pure hypercholesterolemia, unspecified: Secondary | ICD-10-CM | POA: Diagnosis not present

## 2013-09-21 DIAGNOSIS — Z0000 Encounter for general adult medical examination without abnormal findings: Secondary | ICD-10-CM

## 2013-09-21 DIAGNOSIS — K219 Gastro-esophageal reflux disease without esophagitis: Secondary | ICD-10-CM

## 2013-09-21 DIAGNOSIS — E119 Type 2 diabetes mellitus without complications: Secondary | ICD-10-CM | POA: Diagnosis not present

## 2013-09-21 MED ORDER — NAPROXEN 500 MG PO TABS: 500.0000 mg | ORAL_TABLET | Freq: Two times a day (BID) | ORAL | Status: DC

## 2013-09-21 MED ORDER — METHYLPHENIDATE HCL 10 MG PO TABS: 20.0000 mg | ORAL_TABLET | Freq: Three times a day (TID) | ORAL | Status: DC

## 2013-09-21 MED ORDER — HYDROCODONE-ACETAMINOPHEN 7.5-325 MG PO TABS: 1.0000 | ORAL_TABLET | Freq: Four times a day (QID) | ORAL | Status: DC | PRN

## 2013-09-21 MED ORDER — METFORMIN HCL ER 500 MG PO TB24: 1500.0000 mg | ORAL_TABLET | Freq: Every day | ORAL | Status: DC

## 2013-09-21 MED ORDER — FLUTICASONE PROPIONATE 50 MCG/ACT NA SUSP: NASAL | Status: DC

## 2013-09-21 MED ORDER — TRIAMCINOLONE ACETONIDE 0.1 % EX CREA: 1.0000 "application " | TOPICAL_CREAM | Freq: Two times a day (BID) | CUTANEOUS | Status: DC

## 2013-09-21 NOTE — Patient Instructions (Signed)
Your A1C is 7.2% today. Great Job! Decrease your Glipizide to one tablet a day. Continue your metformin and Januvia at the current dose.   Contact Dr Burgess Estelle to have your eyes examined to make sure there is no diabetic changes in your eyes that need to be treated.    Your blood pressure is controlled.  Continue your current blood pressure medications.   We will do your cervical cancer screening exam next office visit.  That will mean a Pap smear.  If the exam is normal, I will likely recommend stopping the cervical cancer screening Pap smears.

## 2013-09-22 ENCOUNTER — Encounter: Payer: Self-pay | Admitting: Family Medicine

## 2013-09-22 NOTE — Assessment & Plan Note (Signed)
Adequate blood pressure control.  No evidence of new end organ damage.  Tolerating medication without significant adverse effects.  Plan to continue current blood pressure regiment with consideration of adding a low dose ACEI for pt with diabetes.

## 2013-09-22 NOTE — Assessment & Plan Note (Signed)
Pap smear on next Office visit. If normal, pt has had three successive neg Paps, so will stop screening for Cx cancer.

## 2013-09-22 NOTE — Assessment & Plan Note (Signed)
Tolerating atorvastatin medication. Continue current medication regiment.

## 2013-09-22 NOTE — Assessment & Plan Note (Addendum)
Adequate glycemic control.  Pt is tolerating the current medication regiment. Continue current treatment plan.   Diabetes Prevention             Daily Aspirin: need to recommend start of aspirin next ov              Statin: yes             Dental evaluation in 19-months: no, need to remind              Recent eGFR: 97 ml/min             ACEI: No. Need to recommend a low dose ACEI  Eye Exam: needs eye exam  Foot Exam: Unremarkable  Diet pattern: adequate

## 2013-09-22 NOTE — Assessment & Plan Note (Signed)
Handwritten Rx for screening or Diagnostic mammogram at [facility] given to patient.

## 2013-09-22 NOTE — Progress Notes (Signed)
Subjective:    Patient ID: [name], female    DOB: Jun 01, 1951, 62 y.o.   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  Pt rates their symptoms as well controlled - Diarrhea no, Pneumonia no,  weight loss no  , melena no, hematochezia no, Dysphagia no, Odynophagia no,     Endocrine   DIABETES MELLITUS II, UNCOMPLICATED - Primary CHRONIC DIABETES  Disease Monitoring  Blood Sugar Ranges: 100 to 180  Polyuria: no   Visual problems: no   Medication Compliance: yes, taking Januvia and metformin and glipizide XL 10 mg BID  Medication Side Effects  Hypoglycemia: yes   Preventitive Health Care             Daily Aspirin: No             Statin: Yes   Eye Exam: has not gone  Foot Exam: no concerns  Diet pattern: fair  Exercise: no     Relevant Medications      glipiZIDE (GLUCOTROL XL) 24 hr tablet      metFORMIN (GLUCOPHAGE-XR) 24 hr tablet   Other Relevant Orders      HgB A1c (Completed)     Musculoskeletal and Integument   OSTEOARTHRITIS, MULTI SITES - 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 No pars defect or acute fracture is seen. There is multilevel spondylosis with disc space loss from L2-L3 through L4- L5. Facet  degenerative changes are present inferiorly). - Quality: There is no swelling, no redness, or no increased warmth. The pain is described as 1/10. occasionally. There is no burning.  - Pattern: intermittent flares on low grade continuous ache  - Duration: years Associated symptoms: Includes stiffness and weakness. There is no sleep loss and no instability.  Hip Pain: no knee pain: no.  Radicular type pain: no Modifying factors: Includes weight bearing pain and pain with ambulation. There is no sitting, and no night pain. There is no pain with weather change. Norco and naproxen medications do help  Assistive devices: none    Relevant Medications      naproxen (NAPROSYN) tablet      HYDROcodone-acetaminophen (NORCO) 7.5-325 MG per tablet     Other   HYPERCHOLESTEROLEMIA Subjective:  - Longstanding issue for patient - Pt taking Atorvastatin 40 mg daily.  - Compliance with treatment has been excellent. - Patient denies muscle pain associated with her medications. Patient denies RUQ pain.  - The patient exercises rarely.  ABNORMAL MAMMOGRAM - Pt has had abnormal mammograms in past that have required diagnostic biopsy with benign findings. - pt is to return to [facility] for screening mammogram soon. Handwritten Rx given to order screening mammogram or diagnostic mammogram (depends  on what the plan was at [facility])    Review of Systems Gastrointestinal: negative for constipation, melena and vomiting    Objective:      Lab Review Lab Results  Component Value Date   CHOL 198 11/22/2008   CHOL 222* 11/07/2007   TRIG 127 11/22/2008   TRIG 94 11/07/2007   HDL 65 11/22/2008   HDL 77 11/07/2007   LDLDIRECT 122* 11/23/2011   LDLDIRECT 48 10/27/2010   LDLDIRECT 136* 03/27/2010      Assessment:     Plan:      Metrics: Intervention Frequency ACO  Documented Smoking Status Yearly  Screened one or more times in 24 months  Cessation Counseling or  Active cessation medication Past 24 months   Past 24 months     smoking status reviewed   Review of Systems see HPI     Objective:   Physical Exam VS reviewed GEN: Alert, Cooperative, Groomed, NAD HEENT: PERRL; EAC bilaterally not occluded, TM's translucent with normal LM, (+) LR;                No cervical LAN, No thyromegaly, No palpable masses COR: RRR, No M/G/R, No JVD, Normal PMI size and location LUNGS: BCTA, No Acc mm use, speaking in full sentences ABDOMEN: (+)BS, soft,  EXT: No peripheral leg edema. Feet without deformity or lesions. Palpable bilateral pedal pulses.  SKIN: No lesion nor rashes of face/trunk/extremities Neuro: Oriented to person, place, and time;   Gait: Normal speed, No significant path deviation, Step through +,  Psych: Normal affect/thought/speech/language Diabetic Foot Check -  Appearance - no lesions, ulcers or calluses Skin - no unusual pallor or redness Monofilament testing -  Right - Great toe, medial, central, lateral ball and posterior foot intact Left - Great toe, medial, central, lateral ball and posterior foot intact        Assessment & Plan:

## 2013-09-22 NOTE — Assessment & Plan Note (Signed)
Adequate symptom control. Tolerating flonase medication. Continue current medication regiment.

## 2013-09-22 NOTE — Assessment & Plan Note (Signed)
Adequate symptom control. Tolerating Vicodin and Naprosyn medications. Continue current medication regiment.

## 2013-09-22 NOTE — Assessment & Plan Note (Addendum)
Adequate symptom control. Tolerating omeprazole medication. Continue current medication regiment.

## 2013-09-27 ENCOUNTER — Encounter: Payer: Self-pay | Admitting: Family Medicine

## 2013-09-28 ENCOUNTER — Other Ambulatory Visit: Payer: Self-pay | Admitting: Family Medicine

## 2013-09-28 DIAGNOSIS — F988 Other specified behavioral and emotional disorders with onset usually occurring in childhood and adolescence: Secondary | ICD-10-CM

## 2013-09-28 MED ORDER — METHYLPHENIDATE HCL 10 MG PO TABS: 20.0000 mg | ORAL_TABLET | Freq: Three times a day (TID) | ORAL | Status: DC

## 2013-09-28 MED ORDER — METHOCARBAMOL 750 MG PO TABS: 750.0000 mg | ORAL_TABLET | Freq: Four times a day (QID) | ORAL | Status: DC

## 2013-10-04 ENCOUNTER — Other Ambulatory Visit: Payer: Self-pay | Admitting: Family Medicine

## 2013-10-04 MED ORDER — METHOCARBAMOL 750 MG PO TABS: 750.0000 mg | ORAL_TABLET | Freq: Four times a day (QID) | ORAL | Status: DC

## 2013-11-28 ENCOUNTER — Other Ambulatory Visit: Payer: Self-pay | Admitting: Family Medicine

## 2013-12-07 ENCOUNTER — Encounter: Payer: Self-pay | Admitting: *Deleted

## 2013-12-07 ENCOUNTER — Other Ambulatory Visit: Payer: Self-pay | Admitting: Family Medicine

## 2013-12-07 MED ORDER — HYDROCODONE-ACETAMINOPHEN 7.5-325 MG PO TABS: 1.0000 | ORAL_TABLET | Freq: Four times a day (QID) | ORAL | Status: DC | PRN

## 2013-12-07 NOTE — Telephone Encounter (Signed)
LMOVM informing pt that the Rx she was requested is up front for pickup. Gaje Tennyson, Salome Spotted

## 2013-12-07 NOTE — Telephone Encounter (Signed)
Hand wrote Rx and gave to [facility]

## 2014-01-08 ENCOUNTER — Ambulatory Visit (INDEPENDENT_AMBULATORY_CARE_PROVIDER_SITE_OTHER): Payer: Medicare Other | Admitting: Family Medicine

## 2014-01-08 ENCOUNTER — Encounter: Payer: Self-pay | Admitting: Family Medicine

## 2014-01-08 VITALS — BP 157/75 | HR 57 | Temp 98.1°F | Ht 62.0 in | Wt 213.0 lb

## 2014-01-08 DIAGNOSIS — E119 Type 2 diabetes mellitus without complications: Secondary | ICD-10-CM

## 2014-01-08 DIAGNOSIS — E7800 Pure hypercholesterolemia, unspecified: Secondary | ICD-10-CM | POA: Diagnosis not present

## 2014-01-08 DIAGNOSIS — Z79899 Other long term (current) drug therapy: Secondary | ICD-10-CM

## 2014-01-08 DIAGNOSIS — Z9189 Other specified personal risk factors, not elsewhere classified: Secondary | ICD-10-CM | POA: Diagnosis not present

## 2014-01-08 DIAGNOSIS — Z87898 Personal history of other specified conditions: Secondary | ICD-10-CM

## 2014-01-08 DIAGNOSIS — I10 Essential (primary) hypertension: Secondary | ICD-10-CM | POA: Diagnosis not present

## 2014-01-08 LAB — BASIC METABOLIC PANEL
BUN: 11 mg/dL (ref 6–23)
CO2: 24 mEq/L (ref 19–32)
CREATININE: 0.79 mg/dL (ref 0.50–1.10)
Calcium: 9.4 mg/dL (ref 8.4–10.5)
Chloride: 96 mEq/L (ref 96–112)
Glucose, Bld: 105 mg/dL — ABNORMAL HIGH (ref 70–99)
Potassium: 3.9 mEq/L (ref 3.5–5.3)
SODIUM: 136 meq/L (ref 135–145)

## 2014-01-08 LAB — POCT GLYCOSYLATED HEMOGLOBIN (HGB A1C): Hemoglobin A1C: 7.9

## 2014-01-08 MED ORDER — HYDROCODONE-ACETAMINOPHEN 7.5-325 MG PO TABS: 1.0000 | ORAL_TABLET | Freq: Four times a day (QID) | ORAL | Status: DC | PRN

## 2014-01-08 NOTE — Patient Instructions (Signed)
Try taking the Lipitor *(atorvastatin) twoice a week.  If you can't tolerate twice a week, try once a week. Some is better than none.

## 2014-01-09 ENCOUNTER — Encounter: Payer: Self-pay | Admitting: Family Medicine

## 2014-01-09 NOTE — Assessment & Plan Note (Signed)
Rx given for pt to get either screening or diagnostic mammogram at [facility].

## 2014-01-09 NOTE — Assessment & Plan Note (Signed)
Adequate blood pressure control.  No evidence of new end organ damage.  Tolerating medication without significant adverse effects.  Plan to continue current blood pressure regiment.   

## 2014-01-09 NOTE — Assessment & Plan Note (Signed)
Lab Results  Component Value Date   HGBA1C 7.9 01/08/2014   Adequate glycemic control.  Pt is tolerating the current medication regiment. Continue current treatment plan.   Diabetes Prevention             Daily Aspirin: No             Statin: asjking patient to try taking one to two times a day             Recent eGFR: Cr 0.79             ACEI: Lotrel  Eye Exam: Needs  Need to discuss use of daily aspirin and eye exam next visit.

## 2014-01-09 NOTE — Assessment & Plan Note (Signed)
Pt not tolerating the Atorvastatin 40 mg daily b/c of myalgias.  Ask patient to try Atorvasttin 40 mg once or twice a week.

## 2014-01-09 NOTE — Progress Notes (Signed)
Patient ID: [name], female   DOB: 1951-04-17, 63 y.o.   MRN: [id number]   Subjective:    Patient ID: [name], female    DOB: 1951-04-26, 63 y.o.   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  Pt rates their symptoms as well controlled - Diarrhea no, Pneumonia no,  weight loss no  , melena no, hematochezia no, Dysphagia no, Odynophagia no,     Endocrine   DIABETES MELLITUS II, UNCOMPLICATED - Primary CHRONIC DIABETES  Disease Monitoring  Blood Sugar Ranges: 100 to 180  Polyuria: no   Visual problems: no   Medication Compliance: yes, taking Januvia and metformin , patient stopped taking Glipizide because of symptoms of hypoglycemia Medication Side Effects  Hypoglycemia: yes   Preventitive Health Care             Daily Aspirin: No             Statin: Yes   Eye Exam: has not gone  Foot Exam: no concerns  Diet pattern: fair  Exercise: no       Musculoskeletal and Integument   OSTEOARTHRITIS, MULTI SITES - 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 No pars defect or acute fracture is seen. There is multilevel spondylosis with disc space loss from L2-L3 through L4- L5. Facet degenerative changes are present inferiorly). -  Quality: There is no swelling, no redness, or no increased warmth. The pain is described as 1/10. occasionally. There is no burning.  - Pattern: intermittent flares on low grade continuous ache  - Duration: years Associated symptoms: Includes stiffness and weakness. There is no sleep loss and no instability.  Hip Pain: no knee pain: no.  Radicular type pain: no Modifying factors: Includes weight bearing pain and pain with ambulation. There is no sitting, and no night pain. There is no pain with weather change. Norco and naproxen medications do help  Assistive devices: none    Relevant Medications      naproxen (NAPROSYN) tablet      HYDROcodone-acetaminophen (NORCO) 7.5-325 MG per tablet     Other   HYPERCHOLESTEROLEMIA Subjective:  - Longstanding issue for patient - Stopped taking the Atorvastatin because of myalgia.  - - The patient exercises rarely.  ABNORMAL MAMMOGRAM - Pt has had abnormal mammograms in past that have required diagnostic biopsy with benign findings. - pt is to return to [facility] for screening mammogram soon. Handwritten Rx given to order screening mammogram or diagnostic mammogram (depends on what the plan was at [facility])    Review of Systems Gastrointestinal: negative for constipation, melena and vomiting    Objective:      Lab Review Lab Results  Component Value  Date   CHOL 198 11/22/2008   CHOL 222* 11/07/2007   TRIG 127 11/22/2008   TRIG 94 11/07/2007   HDL 65 11/22/2008   HDL 77 11/07/2007   LDLDIRECT 122* 11/23/2011   LDLDIRECT 48 10/27/2010   LDLDIRECT 136* 03/27/2010      Assessment:     Plan:      Metrics: Intervention Frequency ACO  Documented Smoking Status Yearly  Screened one or more times in 24 months  Cessation Counseling or  Active cessation medication Past 24 months  Past 24 months     smoking status reviewed   Review of Systems see HPI     Objective:   Physical Exam VS reviewed GEN: Alert, Cooperative, Groomed, NAD HEENT:  PERRL; EAC bilaterally not occluded, TM's translucent with normal LM, (+) LR;                No cervical LAN, No thyromegaly, No palpable masses COR: RRR, No M/G/R, No JVD, Normal PMI size and location LUNGS: BCTA, No Acc mm use, speaking in full sentences ABDOMEN: (+)BS, soft,  EXT: No peripheral leg edema. Feet without deformity or lesions. Palpable bilateral pedal pulses.  SKIN: No lesion nor rashes of face/trunk/extremities Neuro: Oriented to person, place, and time;   Gait: Normal speed, No significant path deviation, Step through +,  Psych: Normal affect/thought/speech/language Diabetic Foot Check -  Appearance - no lesions, ulcers or calluses Skin - no unusual pallor or redness Monofilament testing -  Right - Great toe, medial, central, lateral ball and posterior foot intact Left - Great toe, medial, central, lateral ball and posterior foot intact        Assessment & Plan:

## 2014-01-25 ENCOUNTER — Other Ambulatory Visit: Payer: Self-pay

## 2014-02-22 ENCOUNTER — Other Ambulatory Visit: Payer: Self-pay | Admitting: Family Medicine

## 2014-02-24 ENCOUNTER — Other Ambulatory Visit: Payer: Self-pay | Admitting: Family Medicine

## 2014-03-27 ENCOUNTER — Other Ambulatory Visit: Payer: Self-pay | Admitting: Family Medicine

## 2014-03-27 ENCOUNTER — Encounter: Payer: Self-pay | Admitting: Family Medicine

## 2014-03-29 ENCOUNTER — Telehealth: Payer: Self-pay | Admitting: Family Medicine

## 2014-03-29 NOTE — Telephone Encounter (Signed)
Refill request for Hydrocodone and Ritalin.

## 2014-03-29 NOTE — Telephone Encounter (Signed)
As best I can tell has not had ritalin prescribed since 2014 would need to see Dr McDiarmid  Had hydrocone prescribed in March - as best I can tell  it is not a regular medication for her She would need to see Dr McDiarmid or me and I would have to evaluate her.  Thanks  [place]

## 2014-03-29 NOTE — Telephone Encounter (Signed)
Will forward to MD who is covering for Dr. McDiarmid. Jazmin Hartsell,CMA

## 2014-03-30 NOTE — Telephone Encounter (Signed)
LM for patient to call back.  Please give message from MD regarding refills.  Please assist patient in making an appt for with PCP or Dr. Erin Hearing.  Thanks [employer]

## 2014-04-19 ENCOUNTER — Telehealth: Payer: Self-pay | Admitting: Family Medicine

## 2014-04-19 DIAGNOSIS — F988 Other specified behavioral and emotional disorders with onset usually occurring in childhood and adolescence: Secondary | ICD-10-CM

## 2014-04-19 DIAGNOSIS — Q762 Congenital spondylolisthesis: Secondary | ICD-10-CM

## 2014-04-19 DIAGNOSIS — M1990 Unspecified osteoarthritis, unspecified site: Secondary | ICD-10-CM

## 2014-04-19 DIAGNOSIS — M47817 Spondylosis without myelopathy or radiculopathy, lumbosacral region: Secondary | ICD-10-CM

## 2014-04-19 NOTE — Telephone Encounter (Signed)
Needs prescription for hydrocodone and rititan. She would like to pick up the paper copies on Monday.

## 2014-04-20 MED ORDER — METHYLPHENIDATE HCL 10 MG PO TABS: 20.0000 mg | ORAL_TABLET | Freq: Three times a day (TID) | ORAL | Status: DC

## 2014-04-20 MED ORDER — HYDROCODONE-ACETAMINOPHEN 7.5-325 MG PO TABS: 1.0000 | ORAL_TABLET | Freq: Four times a day (QID) | ORAL | Status: DC | PRN

## 2014-04-23 NOTE — Telephone Encounter (Signed)
Pt informed. Fleeger, Jessica Dawn  

## 2014-04-23 NOTE — Telephone Encounter (Signed)
-----   Message from Blane Ohara McDiarmid, MD sent at 04/20/2014 1:25 PM ----- Please let patient know her prescription(s) are available for pick up from the [facility] front desk.

## 2014-05-03 ENCOUNTER — Ambulatory Visit: Payer: Medicare Other | Admitting: Family Medicine

## 2014-05-31 ENCOUNTER — Other Ambulatory Visit: Payer: Self-pay | Admitting: Family Medicine

## 2014-05-31 MED ORDER — AZITHROMYCIN 250 MG PO TABS: ORAL_TABLET | ORAL | Status: DC

## 2014-05-31 MED ORDER — ALBUTEROL SULFATE HFA 108 (90 BASE) MCG/ACT IN AERS: 2.0000 | INHALATION_SPRAY | Freq: Four times a day (QID) | RESPIRATORY_TRACT | Status: DC | PRN

## 2014-05-31 MED ORDER — BENZONATATE 200 MG PO CAPS: 200.0000 mg | ORAL_CAPSULE | Freq: Two times a day (BID) | ORAL | Status: DC | PRN

## 2014-06-07 ENCOUNTER — Encounter: Payer: Self-pay | Admitting: Family Medicine

## 2014-06-07 ENCOUNTER — Ambulatory Visit (INDEPENDENT_AMBULATORY_CARE_PROVIDER_SITE_OTHER): Payer: Medicare Other | Admitting: Family Medicine

## 2014-06-07 VITALS — BP 140/74 | HR 97 | Temp 98.1°F | Ht 62.0 in | Wt 208.0 lb

## 2014-06-07 DIAGNOSIS — E119 Type 2 diabetes mellitus without complications: Secondary | ICD-10-CM | POA: Diagnosis not present

## 2014-06-07 DIAGNOSIS — I10 Essential (primary) hypertension: Secondary | ICD-10-CM

## 2014-06-07 DIAGNOSIS — M1990 Unspecified osteoarthritis, unspecified site: Secondary | ICD-10-CM | POA: Diagnosis not present

## 2014-06-07 DIAGNOSIS — E7800 Pure hypercholesterolemia, unspecified: Secondary | ICD-10-CM | POA: Diagnosis not present

## 2014-06-07 DIAGNOSIS — H65199 Other acute nonsuppurative otitis media, unspecified ear: Secondary | ICD-10-CM | POA: Diagnosis not present

## 2014-06-07 DIAGNOSIS — H65192 Other acute nonsuppurative otitis media, left ear: Secondary | ICD-10-CM

## 2014-06-07 HISTORY — DX: Other acute nonsuppurative otitis media, unspecified ear: H65.199

## 2014-06-07 LAB — POCT GLYCOSYLATED HEMOGLOBIN (HGB A1C): Hemoglobin A1C: 9.3

## 2014-06-07 MED ORDER — FLUTICASONE PROPIONATE 50 MCG/ACT NA SUSP: NASAL | Status: DC

## 2014-06-07 MED ORDER — SITAGLIPTIN PHOSPHATE 100 MG PO TABS: 100.0000 mg | ORAL_TABLET | Freq: Every day | ORAL | Status: DC

## 2014-06-07 MED ORDER — METFORMIN HCL ER 500 MG PO TB24: 1500.0000 mg | ORAL_TABLET | Freq: Every day | ORAL | Status: DC

## 2014-06-07 MED ORDER — AMLODIPINE BESY-BENAZEPRIL HCL 10-20 MG PO CAPS: 1.0000 | ORAL_CAPSULE | Freq: Every day | ORAL | Status: DC

## 2014-06-07 MED ORDER — ATORVASTATIN CALCIUM 40 MG PO TABS: 40.0000 mg | ORAL_TABLET | Freq: Every day | ORAL | Status: DC

## 2014-06-07 MED ORDER — HYDROCHLOROTHIAZIDE 25 MG PO TABS: 25.0000 mg | ORAL_TABLET | Freq: Every day | ORAL | Status: DC

## 2014-06-07 MED ORDER — NAPROXEN 500 MG PO TABS: 500.0000 mg | ORAL_TABLET | Freq: Two times a day (BID) | ORAL | Status: DC

## 2014-06-07 MED ORDER — GLIPIZIDE ER 10 MG PO TB24: 10.0000 mg | ORAL_TABLET | Freq: Every day | ORAL | Status: DC

## 2014-06-07 NOTE — Patient Instructions (Addendum)
Oxymetazolone (Afrin) 2 sprays each notril twice a day for maximum of 4 days to help open tubes to ears to drain out fluid.   Your A1C is 9.3 % up from 7.9% in March. Agree with the restart of your Glipizide.  Continue your Metformin and Januvia.   [facility] in  3 months  We will call you to set up Retinal Scan at the [place] to screen for Diabetic Retinopathy.    BronchitisAcute Bronchitis Bronchitis is inflammation of the airways that extend from the windpipe into the lungs (bronchi). The inflammation often causes mucus to develop. This leads to a cough, which is the most common symptom of bronchitis.  In acute bronchitis, the condition usually develops suddenly and goes away over time, usually in a couple weeks. Smoking, allergies, and asthma can make bronchitis worse. Repeated episodes of bronchitis may cause further lung problems.  CAUSES Acute bronchitis is most often caused by the same virus that causes a cold. The virus can spread from person to person (contagious) through coughing, sneezing, and touching contaminated objects. SIGNS AND SYMPTOMS   Cough.   Fever.   Coughing up mucus.   Body aches.   Chest congestion.   Chills.   Shortness of breath.   Sore throat.  DIAGNOSIS  Acute bronchitis is usually diagnosed through a physical exam. Your health care provider will also ask you questions about your medical history. Tests, such as chest X-rays, are sometimes done to rule out other conditions.  TREATMENT  Acute bronchitis usually goes away in a couple weeks. Oftentimes, no medical treatment is necessary. Medicines are sometimes given for relief of fever or cough. Antibiotic medicines are usually not needed but may be prescribed in certain situations. In some cases, an inhaler may be recommended to help reduce shortness of breath and control the cough. A cool mist vaporizer may also be used to help thin bronchial secretions and make it easier to  clear the chest.  HOME CARE INSTRUCTIONS  Get plenty of rest.   Drink enough fluids to keep your urine clear or pale yellow (unless you have a medical condition that requires fluid restriction). Increasing fluids may help thin your respiratory secretions (sputum) and reduce chest congestion, and it will prevent dehydration.   Take medicines only as directed by your health care provider.  If you were prescribed an antibiotic medicine, finish it all even if you start to feel better.  Avoid smoking and secondhand smoke. Exposure to cigarette smoke or irritating chemicals will make bronchitis worse. If you are a smoker, consider using nicotine gum or skin patches to help control withdrawal symptoms. Quitting smoking will help your lungs heal faster.   Reduce the chances of another bout of acute bronchitis by washing your hands frequently, avoiding people with cold symptoms, and trying not to touch your hands to your mouth, nose, or eyes.   Keep all follow-up visits as directed by your health care provider.  SEEK MEDICAL CARE IF: Your symptoms do not improve after 1 week of treatment.  SEEK IMMEDIATE MEDICAL CARE IF:  You develop an increased fever or chills.   You have chest pain.   You have severe shortness of breath.  You have bloody sputum.   You develop dehydration.  You faint or repeatedly feel like you are going to pass out.  You develop repeated vomiting.  You develop a severe headache. MAKE SURE YOU:   Understand these instructions.  Will watch your condition.  Will get  help right away if you are not doing well or get worse. Document Released: 11/12/2004 Document Revised: 02/19/2014 Document Reviewed: 03/28/2013 [facility] Patient Information 2015 [place], [place]. This information is not intended to replace advice given to you by your health care provider. Make sure you discuss any questions you have with your health care provider.

## 2014-06-07 NOTE — Progress Notes (Signed)
   Subjective:    Patient ID: [name], female    DOB: May 17, 1951, 63 y.o.   MRN: [id number]  HPI HYPERTENSION  Disease Monitoring: Blood pressure range-not checking at home Chest pain- no      Dyspnea- no  Medications: Compliance- yes, taking Lotrel and HCTZ Lightheadedness- no   Edema- no   DIABETES  Disease Monitoring:  Blood Sugar ranges-Running "high"last two weeks because of illnesses per pt  Polyuria- No New Visual problems- no  Medications: Compliance- taking metformin, taking Januvia, started back taking Glipizide 10 mg XL daily Hypoglycemic symptoms- no    HYPERLIPIDEMIA  Disease Monitoring: See symptoms for Hypertension  Medications: Compliance- taking Atrovastatin one to two times a week and CoQ  RUQ pain- no Muscle aches- no  Chronic Back pain with Spasms - longstanding probvlem - takes robaxin twice a day, and hydrocodone/apap 7.5/325 twice a day most days, takes Naprosyn once a day usually - No confusion, falls, constipation  Cough - Onset two weeks ago - Sinusitis treated with Azithromycin 5 day (completed 3 days ago.  and Albuterol MDI prn - Slowly improving. - New discomfort in left ear. - No fever, chills,  ROS See HPI above   PMH Smoking Status noted       Review of Systems     Objective:   Physical Exam  Constitutional: No distress.  obese  HENT:  Left Ear: Tympanic membrane is not injected and not bulging. A middle ear effusion is present.  Ears:  Cardiovascular: Normal rate, regular rhythm, S1 normal, S2 normal and normal heart sounds.   Pulmonary/Chest: Effort normal. She has no decreased breath sounds. She has wheezes. She has rhonchi. She has no rales.  Musculoskeletal: She exhibits no edema.  Psychiatric: Her speech is normal.          Assessment & Plan:

## 2014-06-07 NOTE — Assessment & Plan Note (Signed)
Adequate BP control. Tolerating medications.  No new end-organ damage.  Continue current medications.

## 2014-06-07 NOTE — Assessment & Plan Note (Signed)
Tolerating Atrovastatin 40 mg once or twice a week. Continue current dose and schedule. Continue CoEnzyme Q daily.

## 2014-06-07 NOTE — Assessment & Plan Note (Signed)
Adequate symptom control. Tolerating Vicodin and Naprosyn medications. Continue current medication regiment. Advised patient that will need to try a trial of Baclofen instead of Robaxin next time she needs a refill of her muscle relaxant for back muscle spasms.  She was agreeable to the trial.

## 2014-06-07 NOTE — Assessment & Plan Note (Signed)
Lab Results  Component Value Date   HGBA1C 9.3 06/07/2014  Worsening of glycemic control Agreewith restart of Glipizide 1XL 10 mg daily Continue Metformin XL 1500 mg daily and Januvia 100 mg daily  DIABETES GOALS:  Glucose Control: Target HgA1C: < 8.0 ACE/ARB- Yes STATIN: Yes, but taking only once or twice a week because of myaglia. Also taking CoEnzyme Q. LAST EYE EXAM (Annually): Over a year. [name] is interested in having a retinal scan here at [facility] for her screening.  No known history of glaucoma or cataract or ARMD.   FOOT EXAM (Annually): Done today without abnormlaity LDL (less than 100 or 70): no MICROALB (last yr): on ACE Exercise (152min/week): counseled

## 2014-06-07 NOTE — Assessment & Plan Note (Signed)
New finding Secondary to recent URI with Acute Sinusitis Recommended Valsalva maneuvers and 4 days of ns Afrin.

## 2014-07-30 ENCOUNTER — Other Ambulatory Visit: Payer: Self-pay | Admitting: Family Medicine

## 2014-07-30 DIAGNOSIS — E119 Type 2 diabetes mellitus without complications: Secondary | ICD-10-CM

## 2014-07-30 DIAGNOSIS — M47816 Spondylosis without myelopathy or radiculopathy, lumbar region: Secondary | ICD-10-CM

## 2014-07-30 DIAGNOSIS — M47817 Spondylosis without myelopathy or radiculopathy, lumbosacral region: Secondary | ICD-10-CM

## 2014-07-30 DIAGNOSIS — Q762 Congenital spondylolisthesis: Secondary | ICD-10-CM

## 2014-07-30 DIAGNOSIS — F988 Other specified behavioral and emotional disorders with onset usually occurring in childhood and adolescence: Secondary | ICD-10-CM

## 2014-07-30 NOTE — Telephone Encounter (Signed)
Pt needs written prescriptions for all of her meds, she takes them to [place]

## 2014-07-30 NOTE — Telephone Encounter (Signed)
Will forward to MD. Skanda Worlds,CMA  

## 2014-07-31 NOTE — Telephone Encounter (Signed)
Pt calls again about med refills.

## 2014-08-01 MED ORDER — ALBUTEROL SULFATE HFA 108 (90 BASE) MCG/ACT IN AERS: 2.0000 | INHALATION_SPRAY | Freq: Four times a day (QID) | RESPIRATORY_TRACT | Status: DC | PRN

## 2014-08-01 MED ORDER — HYDROCODONE-ACETAMINOPHEN 7.5-325 MG PO TABS: 1.0000 | ORAL_TABLET | Freq: Four times a day (QID) | ORAL | Status: DC | PRN

## 2014-08-01 MED ORDER — NAPROXEN 500 MG PO TABS: 500.0000 mg | ORAL_TABLET | Freq: Two times a day (BID) | ORAL | Status: DC

## 2014-08-01 MED ORDER — METHYLPHENIDATE HCL 10 MG PO TABS: 20.0000 mg | ORAL_TABLET | Freq: Three times a day (TID) | ORAL | Status: DC

## 2014-08-01 MED ORDER — GLIPIZIDE ER 10 MG PO TB24: 10.0000 mg | ORAL_TABLET | Freq: Every day | ORAL | Status: DC

## 2014-08-01 MED ORDER — METHOCARBAMOL 750 MG PO TABS: 750.0000 mg | ORAL_TABLET | Freq: Four times a day (QID) | ORAL | Status: DC | PRN

## 2014-08-01 MED ORDER — ATORVASTATIN CALCIUM 40 MG PO TABS: 40.0000 mg | ORAL_TABLET | Freq: Every day | ORAL | Status: DC

## 2014-08-01 MED ORDER — OMEPRAZOLE 20 MG PO TBEC: 20.0000 mg | DELAYED_RELEASE_TABLET | Freq: Every day | ORAL | Status: DC | PRN

## 2014-08-01 MED ORDER — SITAGLIPTIN PHOSPHATE 100 MG PO TABS: 100.0000 mg | ORAL_TABLET | Freq: Every day | ORAL | Status: DC

## 2014-08-01 MED ORDER — AMLODIPINE BESY-BENAZEPRIL HCL 10-20 MG PO CAPS: 1.0000 | ORAL_CAPSULE | Freq: Every day | ORAL | Status: DC

## 2014-08-01 MED ORDER — METFORMIN HCL ER 500 MG PO TB24: 1500.0000 mg | ORAL_TABLET | Freq: Every day | ORAL | Status: DC

## 2014-08-01 MED ORDER — HYDROCHLOROTHIAZIDE 25 MG PO TABS: 25.0000 mg | ORAL_TABLET | Freq: Every day | ORAL | Status: DC

## 2014-08-01 NOTE — Telephone Encounter (Signed)
Pt is aware of rx up front for pick up. Jazmin Hartsell,CMA

## 2014-08-01 NOTE — Telephone Encounter (Signed)
Please let patient know her controlled substance prescription(s) are available for pick up from the [facility] front desk.  Pt's other prescriptions sent to her local pharmacy.

## 2014-08-10 ENCOUNTER — Ambulatory Visit (INDEPENDENT_AMBULATORY_CARE_PROVIDER_SITE_OTHER): Payer: Medicare Other | Admitting: *Deleted

## 2014-08-10 DIAGNOSIS — Z23 Encounter for immunization: Secondary | ICD-10-CM | POA: Diagnosis not present

## 2014-09-27 ENCOUNTER — Other Ambulatory Visit: Payer: Self-pay | Admitting: Family Medicine

## 2014-09-27 DIAGNOSIS — M47817 Spondylosis without myelopathy or radiculopathy, lumbosacral region: Secondary | ICD-10-CM

## 2014-09-27 DIAGNOSIS — Q762 Congenital spondylolisthesis: Secondary | ICD-10-CM

## 2014-09-27 DIAGNOSIS — M47816 Spondylosis without myelopathy or radiculopathy, lumbar region: Secondary | ICD-10-CM

## 2014-09-27 DIAGNOSIS — J309 Allergic rhinitis, unspecified: Secondary | ICD-10-CM

## 2014-09-27 MED ORDER — HYDROCODONE-ACETAMINOPHEN 7.5-325 MG PO TABS: 1.0000 | ORAL_TABLET | Freq: Four times a day (QID) | ORAL | Status: DC | PRN

## 2014-09-27 MED ORDER — FLUTICASONE PROPIONATE 50 MCG/ACT NA SUSP: NASAL | Status: DC

## 2014-10-26 DIAGNOSIS — H2513 Age-related nuclear cataract, bilateral: Secondary | ICD-10-CM | POA: Diagnosis not present

## 2014-11-29 ENCOUNTER — Other Ambulatory Visit: Payer: Self-pay | Admitting: Family Medicine

## 2014-11-29 DIAGNOSIS — F988 Other specified behavioral and emotional disorders with onset usually occurring in childhood and adolescence: Secondary | ICD-10-CM

## 2014-11-29 DIAGNOSIS — M47817 Spondylosis without myelopathy or radiculopathy, lumbosacral region: Secondary | ICD-10-CM

## 2014-11-29 DIAGNOSIS — Q762 Congenital spondylolisthesis: Secondary | ICD-10-CM

## 2014-11-29 DIAGNOSIS — M47816 Spondylosis without myelopathy or radiculopathy, lumbar region: Secondary | ICD-10-CM

## 2014-11-29 MED ORDER — HYDROCODONE-ACETAMINOPHEN 7.5-325 MG PO TABS: 1.0000 | ORAL_TABLET | Freq: Four times a day (QID) | ORAL | Status: DC | PRN

## 2014-11-29 MED ORDER — METHYLPHENIDATE HCL 10 MG PO TABS: 20.0000 mg | ORAL_TABLET | Freq: Three times a day (TID) | ORAL | Status: DC

## 2014-11-29 NOTE — Telephone Encounter (Signed)
Pt is calling back and would like to know how much longer it will be since she is going to [facility]. jw

## 2014-11-29 NOTE — Telephone Encounter (Signed)
Will forward to MD again.  Pt just called this morning while he was in clinic. Jazmin Hartsell,CMA

## 2014-11-29 NOTE — Telephone Encounter (Signed)
Pt called and needs refills on her Ritalin and hydrocodone left up front. Please call patient when ready . jw

## 2014-11-29 NOTE — Telephone Encounter (Signed)
Pt is aware of prescriptions being ready for pick up.  Jazmin Hartsell,CMA

## 2014-11-29 NOTE — Telephone Encounter (Signed)
Please let patient know her prescription(s) are available for pick up from the [facility] front desk.  

## 2014-12-20 ENCOUNTER — Telehealth: Payer: Self-pay | Admitting: Family Medicine

## 2014-12-20 NOTE — Telephone Encounter (Signed)
Pt called and would like the doctor to call her in something for her cold and post nasal drip. j w

## 2014-12-21 ENCOUNTER — Ambulatory Visit (INDEPENDENT_AMBULATORY_CARE_PROVIDER_SITE_OTHER): Payer: Medicare Other | Admitting: Family Medicine

## 2014-12-21 ENCOUNTER — Encounter: Payer: Self-pay | Admitting: Family Medicine

## 2014-12-21 VITALS — BP 139/68 | HR 97 | Temp 98.1°F | Ht 62.0 in | Wt 206.5 lb

## 2014-12-21 DIAGNOSIS — H65193 Other acute nonsuppurative otitis media, bilateral: Secondary | ICD-10-CM | POA: Diagnosis not present

## 2014-12-21 DIAGNOSIS — J0191 Acute recurrent sinusitis, unspecified: Secondary | ICD-10-CM

## 2014-12-21 DIAGNOSIS — J019 Acute sinusitis, unspecified: Secondary | ICD-10-CM

## 2014-12-21 DIAGNOSIS — J0100 Acute maxillary sinusitis, unspecified: Secondary | ICD-10-CM

## 2014-12-21 HISTORY — DX: Acute sinusitis, unspecified: J01.90

## 2014-12-21 HISTORY — DX: Acute recurrent sinusitis, unspecified: J01.91

## 2014-12-21 MED ORDER — AZITHROMYCIN 250 MG PO TABS: ORAL_TABLET | ORAL | Status: DC

## 2014-12-21 NOTE — Assessment & Plan Note (Signed)
Recurrent vs. chronic noted on exam today. No infection.

## 2014-12-21 NOTE — Progress Notes (Signed)
Subjective: [name] is a 64 y.o. female patient of [name],[name], [name] presenting for sinus pain and URI symptoms.   Reports 14 days of coughing, postnasal drip and clear rhinorrhea. This has worsened with yellow rhinorrhea and throat irritation. Also ear fullness. She has pain in her left face and behind eye. No dental pain. Reports feeling febrile 4 days ago but not since. Taking sudafed and naproxen.   Has a history of sinus infections.  - Non-smoker  Objective: BP 139/68 mmHg  Pulse 97  Temp(Src) 98.1 F (36.7 C) (Oral)  Ht 5\' 2"  (1.575 m)  Wt 206 lb 8 oz (93.668 kg)  BMI 37.76 kg/m2 Gen: Ill-appearing 64 y.o. female in no distress HEENT: moist mucous membranes, eyes normal, TMs translucent with clear effusions bilaterally, normal landmarks. Nares hyperemic and boggy turbinates, oropharynx clear, tenderness to percussion along left maxillary sinus and generally. Normal dentition NECK: supple, no lymphadenopathy CHEST: normal air exchange with normal respiratory effort HEART: regular rate, normal S1/S2, no murmurs  Assessment/Plan: [name] is a 64 y.o. female here for viral URI with secondary left maxillary sinusitis.  See problem list for plan.

## 2014-12-21 NOTE — Patient Instructions (Signed)
-   Take afrin into the nose as directed for the next 3 days - Take azithromycin as directed for the next 5 days - Tylenol and naproxen prn discomfort

## 2014-12-21 NOTE — Assessment & Plan Note (Addendum)
Given duration will treat with abx.  - Z pack - Continue allegra, flonase, and add afrin x 3 days - Reviewed other supportive therapies

## 2015-01-04 ENCOUNTER — Telehealth: Payer: Self-pay | Admitting: *Deleted

## 2015-01-04 NOTE — Telephone Encounter (Signed)
Called pt and she already has an appt for 4/7. Deseree Kennon Holter, CMA

## 2015-01-07 NOTE — Progress Notes (Signed)
I was the preceptor for this encounter. Paulette Rockford, M.D. 

## 2015-01-24 ENCOUNTER — Encounter: Payer: Self-pay | Admitting: Family Medicine

## 2015-01-24 ENCOUNTER — Ambulatory Visit (INDEPENDENT_AMBULATORY_CARE_PROVIDER_SITE_OTHER): Payer: Medicare Other | Admitting: Family Medicine

## 2015-01-24 ENCOUNTER — Other Ambulatory Visit: Payer: Self-pay | Admitting: Family Medicine

## 2015-01-24 VITALS — BP 123/78 | HR 122 | Temp 98.3°F | Ht 62.0 in | Wt 207.5 lb

## 2015-01-24 DIAGNOSIS — F909 Attention-deficit hyperactivity disorder, unspecified type: Secondary | ICD-10-CM

## 2015-01-24 DIAGNOSIS — Z1159 Encounter for screening for other viral diseases: Secondary | ICD-10-CM

## 2015-01-24 DIAGNOSIS — M47816 Spondylosis without myelopathy or radiculopathy, lumbar region: Secondary | ICD-10-CM

## 2015-01-24 DIAGNOSIS — Q762 Congenital spondylolisthesis: Secondary | ICD-10-CM | POA: Diagnosis not present

## 2015-01-24 DIAGNOSIS — Z780 Asymptomatic menopausal state: Secondary | ICD-10-CM | POA: Diagnosis not present

## 2015-01-24 DIAGNOSIS — Z23 Encounter for immunization: Secondary | ICD-10-CM

## 2015-01-24 DIAGNOSIS — M159 Polyosteoarthritis, unspecified: Secondary | ICD-10-CM

## 2015-01-24 DIAGNOSIS — M150 Primary generalized (osteo)arthritis: Secondary | ICD-10-CM | POA: Diagnosis not present

## 2015-01-24 DIAGNOSIS — E119 Type 2 diabetes mellitus without complications: Secondary | ICD-10-CM | POA: Diagnosis not present

## 2015-01-24 DIAGNOSIS — Z114 Encounter for screening for human immunodeficiency virus [HIV]: Secondary | ICD-10-CM | POA: Diagnosis not present

## 2015-01-24 DIAGNOSIS — F988 Other specified behavioral and emotional disorders with onset usually occurring in childhood and adolescence: Secondary | ICD-10-CM

## 2015-01-24 DIAGNOSIS — E2839 Other primary ovarian failure: Secondary | ICD-10-CM | POA: Diagnosis not present

## 2015-01-24 DIAGNOSIS — I10 Essential (primary) hypertension: Secondary | ICD-10-CM | POA: Diagnosis not present

## 2015-01-24 DIAGNOSIS — M47817 Spondylosis without myelopathy or radiculopathy, lumbosacral region: Secondary | ICD-10-CM | POA: Diagnosis not present

## 2015-01-24 LAB — LIPID PANEL
CHOL/HDL RATIO: 3.5 ratio
CHOLESTEROL: 177 mg/dL (ref 0–200)
HDL: 51 mg/dL (ref >=46)
LDL Cholesterol: 102 mg/dL — ABNORMAL HIGH (ref 0–99)
Triglycerides: 121 mg/dL (ref <150)
VLDL: 24 mg/dL (ref 0–40)

## 2015-01-24 LAB — POCT GLYCOSYLATED HEMOGLOBIN (HGB A1C): Hemoglobin A1C: 7.2

## 2015-01-24 MED ORDER — BACLOFEN 10 MG PO TABS: 5.0000 mg | ORAL_TABLET | Freq: Three times a day (TID) | ORAL | Status: DC

## 2015-01-24 MED ORDER — METHYLPHENIDATE HCL 10 MG PO TABS: 20.0000 mg | ORAL_TABLET | Freq: Three times a day (TID) | ORAL | Status: DC

## 2015-01-24 MED ORDER — HYDROCODONE-ACETAMINOPHEN 7.5-325 MG PO TABS: 1.0000 | ORAL_TABLET | Freq: Four times a day (QID) | ORAL | Status: DC | PRN

## 2015-01-24 NOTE — Progress Notes (Signed)
Subjective:    Patient ID: [name], female    DOB: 06-Oct-1951, 64 y.o.   MRN: [id number]  HPI Problem List Items Addressed This Visit     Cardiovascular and Mediastinum   HYPERTENSION, BENIGN SYSTEMIC  Disease Monitoring  Blood pressure range: running 140/80 at home  Chest pain: no   Dyspnea: no   Claudication: no   Medication compliance: yes  Medication Side Effects  Lightheadedness: no   Urinary frequency: no   Edema: no     Preventitive Healthcare:  Exercise: no   Diet Pattern: fair  Salt Restriction: no     Endocrine   DIABETES MELLITUS II, UNCOMPLICATED - Primary CHRONIC DIABETES  Disease Monitoring  Blood Sugar Ranges: 100 to 180  Polyuria: no   Visual problems: no   Medication Compliance: yes, taking Januvia and metformin. Self Stopped glipizide about two months ago because of hypoglycemia with symptoms with self rescues.  Also self-reduced metformin to 1000 XL daily about two months ago. Medication Side Effects  Hypoglycemia: yes   Preventitive Health Care             Daily Aspirin: No             Statin: Yes   Eye Exam: has not gone  Foot Exam: no concerns  Diet pattern: fair  Exercise: no       Musculoskeletal and Integument   OSTEOARTHRITIS, MULTI SITES - 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 No pars defect or acute fracture is seen. There is multilevel spondylosis with disc space loss from L2-L3 through L4- L5. Facet degenerative changes are present inferiorly). - Quality: There is no swelling, no redness, or no increased warmth. The pain is described as 1/10. occasionally. There is no burning.  - Pattern: intermittent flares on low grade continuous ache  - Duration: years Associated symptoms: Includes stiffness and weakness. There is no sleep loss and no instability.  Hip Pain:  no knee pain: no.  Radicular type pain: no Modifying factors: Includes weight bearing pain and pain with ambulation. There is no sitting, and no night pain. There is no pain with weather change. Norco and naproxen medications do help  Assistive devices: none     ADHD - Longstanding problem - Takes Ritalin 20 mg three times daily  - No chest pain, no shortness of breath, no tics, no headaches - Improves ability to organize and focus on tasks.   smoking status reviewed   Review of Systems see HPI     Objective:   Physical Exam VS reviewed GEN: Alert, Cooperative, Groomed, NAD HEENT: PERRL; EAC bilaterally not occluded, TM's translucent with normal LM, (+) LR;                No cervical LAN, No thyromegaly, No palpable masses COR: RRR, No M/G/R, No JVD, Normal PMI size and location LUNGS: BCTA, No Acc mm use, speaking in full sentences ABDOMEN: (+)BS, soft,  EXT: No peripheral leg edema. Feet without deformity or lesions. Palpable bilateral pedal pulses.  SKIN: No lesion nor rashes of face/trunk/extremities Neuro: Oriented to person, place, and time;   Gait: Normal speed, No significant path deviation, Step through +,  Psych: Normal affect/thought/speech/language Diabetic Foot Check -  Appearance - no lesions, ulcers or calluses Skin - no unusual pallor or redness Monofilament testing -  Right - Great toe, medial, central, lateral ball and posterior foot intact Left - Great toe, medial, central, lateral ball and posterior foot intact        Assessment & Plan:

## 2015-01-24 NOTE — Patient Instructions (Signed)
Your A1c is great.  Keep taking the Januvia and metformin as you currently are.   We are screening everyone for Hepatitis C and HIV.  Dr Amayia Ciano will call you if your tests are not good. Otherwise he will send you a letter.  If you sign up for MyChart online, you will be able to see your test results once Dr Latecia Miler has reviewed them.  If you do not hear from [place] with in 2 weeks please call our office. . We will schedule you for a DEXA (Bone density scan)  We are switching your robaxin to Baclofen for muscle spasms.  It is a safer medication in patients over age 19.   You received the Prevnar-13 vaccination today as a booster for pneumonia.

## 2015-01-24 NOTE — Assessment & Plan Note (Signed)
Adequate symptom control. Tolerating Vicodin and Naprosyn medications. Continue current medication regiment. Patient willing to try a trial of Baclofen instead of Robaxin for as needed her muscle relaxant for back muscle spasms. Stopping robaxin and started Baclofen

## 2015-01-24 NOTE — Assessment & Plan Note (Signed)
Adequate blood pressure control.  No evidence of new end organ damage.  Tolerating medication without significant adverse effects.  Plan to continue current blood pressure regiment.   

## 2015-01-24 NOTE — Assessment & Plan Note (Signed)
Adequate glycemic control. Tolerating medications.  No new end-organ damage.  Continue current medications.  

## 2015-01-24 NOTE — Assessment & Plan Note (Signed)
Adequate symptom control. Tolerating ritalin medication. Continue current medication regiment.

## 2015-01-25 ENCOUNTER — Encounter: Payer: Self-pay | Admitting: Family Medicine

## 2015-01-25 ENCOUNTER — Other Ambulatory Visit: Payer: Self-pay | Admitting: *Deleted

## 2015-01-25 LAB — HEPATITIS C ANTIBODY: HCV Ab: NEGATIVE

## 2015-01-25 LAB — HIV ANTIBODY (ROUTINE TESTING W REFLEX): HIV: NONREACTIVE

## 2015-01-25 MED ORDER — BACLOFEN 10 MG PO TABS: 5.0000 mg | ORAL_TABLET | Freq: Three times a day (TID) | ORAL | Status: DC

## 2015-01-25 NOTE — Telephone Encounter (Signed)
[facility]  They are requesting to change baclofen to a 90 days supply.  Spoke with Dr. McDiarmid who is ok with change.  Verbal order given for change. [place], Salome Spotted

## 2015-03-06 ENCOUNTER — Other Ambulatory Visit: Payer: Self-pay | Admitting: Family Medicine

## 2015-03-06 DIAGNOSIS — M47816 Spondylosis without myelopathy or radiculopathy, lumbar region: Secondary | ICD-10-CM

## 2015-03-06 DIAGNOSIS — Q762 Congenital spondylolisthesis: Secondary | ICD-10-CM

## 2015-03-06 DIAGNOSIS — M47817 Spondylosis without myelopathy or radiculopathy, lumbosacral region: Secondary | ICD-10-CM

## 2015-03-06 DIAGNOSIS — M159 Polyosteoarthritis, unspecified: Secondary | ICD-10-CM

## 2015-03-06 DIAGNOSIS — M150 Primary generalized (osteo)arthritis: Secondary | ICD-10-CM

## 2015-03-12 NOTE — Telephone Encounter (Signed)
Pt is calling to check the status of her request for refills through [facility]. jwp

## 2015-03-19 MED ORDER — HYDROCODONE-ACETAMINOPHEN 7.5-325 MG PO TABS: 1.0000 | ORAL_TABLET | Freq: Four times a day (QID) | ORAL | Status: DC | PRN

## 2015-03-19 NOTE — Telephone Encounter (Signed)
Please let patient know her prescription(s) are available for pick up from the [facility] front desk.  

## 2015-03-19 NOTE — Telephone Encounter (Signed)
LMOVM stating prescription ready for PU and to call back if she had any questions. Please let pt know if she calls back. Kalyse Meharg, CMA.

## 2015-04-04 ENCOUNTER — Other Ambulatory Visit: Payer: Self-pay | Admitting: Family Medicine

## 2015-04-04 MED ORDER — METHOCARBAMOL 750 MG PO TABS: 750.0000 mg | ORAL_TABLET | Freq: Four times a day (QID) | ORAL | Status: DC | PRN

## 2015-05-02 ENCOUNTER — Other Ambulatory Visit: Payer: Self-pay | Admitting: Family Medicine

## 2015-05-02 DIAGNOSIS — M159 Polyosteoarthritis, unspecified: Secondary | ICD-10-CM

## 2015-05-02 DIAGNOSIS — M47816 Spondylosis without myelopathy or radiculopathy, lumbar region: Secondary | ICD-10-CM

## 2015-05-02 DIAGNOSIS — M47817 Spondylosis without myelopathy or radiculopathy, lumbosacral region: Secondary | ICD-10-CM

## 2015-05-02 DIAGNOSIS — Q762 Congenital spondylolisthesis: Secondary | ICD-10-CM

## 2015-05-02 DIAGNOSIS — M150 Primary generalized (osteo)arthritis: Secondary | ICD-10-CM

## 2015-05-02 NOTE — Telephone Encounter (Signed)
Pt called because it is time for a refill on her pain medication. Please call when ready to pick up. jw

## 2015-05-03 MED ORDER — HYDROCODONE-ACETAMINOPHEN 7.5-325 MG PO TABS: 1.0000 | ORAL_TABLET | Freq: Four times a day (QID) | ORAL | Status: DC | PRN

## 2015-05-03 NOTE — Telephone Encounter (Signed)
Please let patient know her prescription(s) are available for pick up from the [facility] front desk.  

## 2015-05-03 NOTE — Telephone Encounter (Signed)
Advised as directed below and verbalized understanding. [name], CMA.

## 2015-05-31 ENCOUNTER — Telehealth: Payer: Self-pay | Admitting: Family Medicine

## 2015-05-31 MED ORDER — GLUCOSE BLOOD VI STRP: 1.0000 | ORAL_STRIP | Freq: Two times a day (BID) | Status: DC

## 2015-05-31 NOTE — Telephone Encounter (Signed)
Refill request from pt. Will forward to PCP for review. Krysten Veronica, CMA. 

## 2015-05-31 NOTE — Telephone Encounter (Signed)
Pt called and needs a refill on her Accu-Chek strips called in. jw

## 2015-05-31 NOTE — Telephone Encounter (Signed)
[facility] Accu-Chek glucometer strips Check CBGs twice a day Dispense 100 strips  Refill PRN

## 2015-06-14 ENCOUNTER — Other Ambulatory Visit: Payer: Self-pay | Admitting: Family Medicine

## 2015-06-14 DIAGNOSIS — M47817 Spondylosis without myelopathy or radiculopathy, lumbosacral region: Secondary | ICD-10-CM

## 2015-06-14 DIAGNOSIS — M47816 Spondylosis without myelopathy or radiculopathy, lumbar region: Secondary | ICD-10-CM

## 2015-06-14 DIAGNOSIS — M159 Polyosteoarthritis, unspecified: Secondary | ICD-10-CM

## 2015-06-14 DIAGNOSIS — Q762 Congenital spondylolisthesis: Secondary | ICD-10-CM

## 2015-06-14 DIAGNOSIS — M150 Primary generalized (osteo)arthritis: Secondary | ICD-10-CM

## 2015-06-14 MED ORDER — HYDROCODONE-ACETAMINOPHEN 7.5-325 MG PO TABS: 1.0000 | ORAL_TABLET | Freq: Four times a day (QID) | ORAL | Status: DC | PRN

## 2015-06-14 NOTE — Telephone Encounter (Signed)
Need refill for hydrocodone- acetaminophen

## 2015-06-14 NOTE — Telephone Encounter (Signed)
Patient informed that Rx is ready for pick up and clinic will be closing at 3 PM today.  Derl Barrow, RN

## 2015-06-14 NOTE — Telephone Encounter (Signed)
Please let patient know her prescription(s) are available for pick up from the [facility] front desk.  

## 2015-06-18 ENCOUNTER — Other Ambulatory Visit: Payer: Self-pay | Admitting: *Deleted

## 2015-06-18 DIAGNOSIS — E1169 Type 2 diabetes mellitus with other specified complication: Secondary | ICD-10-CM

## 2015-06-18 DIAGNOSIS — E119 Type 2 diabetes mellitus without complications: Secondary | ICD-10-CM

## 2015-06-18 DIAGNOSIS — E669 Obesity, unspecified: Secondary | ICD-10-CM

## 2015-06-19 MED ORDER — HYDROCHLOROTHIAZIDE 25 MG PO TABS: 25.0000 mg | ORAL_TABLET | Freq: Every day | ORAL | Status: DC

## 2015-06-19 MED ORDER — AMLODIPINE BESY-BENAZEPRIL HCL 10-20 MG PO CAPS: 1.0000 | ORAL_CAPSULE | Freq: Every day | ORAL | Status: DC

## 2015-06-19 MED ORDER — SITAGLIPTIN PHOSPHATE 100 MG PO TABS: 100.0000 mg | ORAL_TABLET | Freq: Every day | ORAL | Status: DC

## 2015-06-19 MED ORDER — NAPROXEN 500 MG PO TABS: 500.0000 mg | ORAL_TABLET | Freq: Two times a day (BID) | ORAL | Status: DC

## 2015-06-20 ENCOUNTER — Other Ambulatory Visit: Payer: Self-pay | Admitting: Family Medicine

## 2015-06-20 DIAGNOSIS — E119 Type 2 diabetes mellitus without complications: Secondary | ICD-10-CM

## 2015-06-20 NOTE — Telephone Encounter (Signed)
Needs 1 month 30 day supply of [place] and Lotrel  Sent to [facility] After this all RX [facility]

## 2015-06-21 ENCOUNTER — Encounter: Payer: Self-pay | Admitting: Family Medicine

## 2015-06-21 MED ORDER — AMLODIPINE BESY-BENAZEPRIL HCL 10-20 MG PO CAPS: 1.0000 | ORAL_CAPSULE | Freq: Every day | ORAL | Status: DC

## 2015-06-21 MED ORDER — SITAGLIPTIN PHOSPHATE 100 MG PO TABS: 100.0000 mg | ORAL_TABLET | Freq: Every day | ORAL | Status: DC

## 2015-06-22 ENCOUNTER — Other Ambulatory Visit: Payer: Self-pay | Admitting: Family Medicine

## 2015-06-25 NOTE — Telephone Encounter (Signed)
Verbal scripts called into both [facility] [address] to [place] and [facility] to [facility] to [place].  Lanis Storlie,CMA

## 2015-06-28 ENCOUNTER — Other Ambulatory Visit: Payer: Self-pay | Admitting: Family Medicine

## 2015-06-28 MED ORDER — SITAGLIPTIN PHOSPHATE 100 MG PO TABS: 100.0000 mg | ORAL_TABLET | Freq: Every day | ORAL | Status: DC

## 2015-06-28 MED ORDER — AMLODIPINE BESY-BENAZEPRIL HCL 10-20 MG PO CAPS: 1.0000 | ORAL_CAPSULE | Freq: Every day | ORAL | Status: DC

## 2015-08-05 ENCOUNTER — Other Ambulatory Visit: Payer: Self-pay | Admitting: Family Medicine

## 2015-08-05 DIAGNOSIS — Q762 Congenital spondylolisthesis: Secondary | ICD-10-CM

## 2015-08-05 DIAGNOSIS — F988 Other specified behavioral and emotional disorders with onset usually occurring in childhood and adolescence: Secondary | ICD-10-CM

## 2015-08-05 DIAGNOSIS — M47816 Spondylosis without myelopathy or radiculopathy, lumbar region: Secondary | ICD-10-CM

## 2015-08-05 DIAGNOSIS — M159 Polyosteoarthritis, unspecified: Secondary | ICD-10-CM

## 2015-08-05 DIAGNOSIS — M47817 Spondylosis without myelopathy or radiculopathy, lumbosacral region: Secondary | ICD-10-CM

## 2015-08-05 DIAGNOSIS — M150 Primary generalized (osteo)arthritis: Secondary | ICD-10-CM

## 2015-08-07 NOTE — Telephone Encounter (Signed)
Pt is calling and needs a refill on her Ritalin and Hydrocodone. jw

## 2015-08-08 ENCOUNTER — Other Ambulatory Visit: Payer: Self-pay | Admitting: Family Medicine

## 2015-08-08 DIAGNOSIS — Q762 Congenital spondylolisthesis: Secondary | ICD-10-CM

## 2015-08-08 DIAGNOSIS — M159 Polyosteoarthritis, unspecified: Secondary | ICD-10-CM

## 2015-08-08 DIAGNOSIS — M47816 Spondylosis without myelopathy or radiculopathy, lumbar region: Secondary | ICD-10-CM

## 2015-08-08 DIAGNOSIS — M150 Primary generalized (osteo)arthritis: Secondary | ICD-10-CM

## 2015-08-08 DIAGNOSIS — M47817 Spondylosis without myelopathy or radiculopathy, lumbosacral region: Secondary | ICD-10-CM

## 2015-08-08 MED ORDER — METHYLPHENIDATE HCL 10 MG PO TABS: 20.0000 mg | ORAL_TABLET | Freq: Three times a day (TID) | ORAL | Status: DC

## 2015-08-08 MED ORDER — HYDROCODONE-ACETAMINOPHEN 7.5-325 MG PO TABS: 1.0000 | ORAL_TABLET | Freq: Four times a day (QID) | ORAL | Status: DC | PRN

## 2015-08-08 NOTE — Telephone Encounter (Signed)
Scripts given to patient. [name],CMA

## 2015-08-13 ENCOUNTER — Ambulatory Visit (INDEPENDENT_AMBULATORY_CARE_PROVIDER_SITE_OTHER): Payer: Medicare Other | Admitting: *Deleted

## 2015-08-13 DIAGNOSIS — Z23 Encounter for immunization: Secondary | ICD-10-CM | POA: Diagnosis present

## 2015-08-29 ENCOUNTER — Ambulatory Visit (INDEPENDENT_AMBULATORY_CARE_PROVIDER_SITE_OTHER): Payer: Medicare Other | Admitting: Family Medicine

## 2015-08-29 ENCOUNTER — Encounter: Payer: Self-pay | Admitting: Family Medicine

## 2015-08-29 VITALS — BP 130/62 | HR 97 | Temp 98.6°F | Ht 62.0 in | Wt 211.4 lb

## 2015-08-29 DIAGNOSIS — F909 Attention-deficit hyperactivity disorder, unspecified type: Secondary | ICD-10-CM | POA: Diagnosis not present

## 2015-08-29 DIAGNOSIS — M150 Primary generalized (osteo)arthritis: Secondary | ICD-10-CM | POA: Diagnosis not present

## 2015-08-29 DIAGNOSIS — E7800 Pure hypercholesterolemia, unspecified: Secondary | ICD-10-CM

## 2015-08-29 DIAGNOSIS — J3089 Other allergic rhinitis: Secondary | ICD-10-CM

## 2015-08-29 DIAGNOSIS — J309 Allergic rhinitis, unspecified: Secondary | ICD-10-CM | POA: Diagnosis not present

## 2015-08-29 DIAGNOSIS — M47817 Spondylosis without myelopathy or radiculopathy, lumbosacral region: Secondary | ICD-10-CM | POA: Diagnosis not present

## 2015-08-29 DIAGNOSIS — M159 Polyosteoarthritis, unspecified: Secondary | ICD-10-CM

## 2015-08-29 DIAGNOSIS — M47816 Spondylosis without myelopathy or radiculopathy, lumbar region: Secondary | ICD-10-CM

## 2015-08-29 DIAGNOSIS — I10 Essential (primary) hypertension: Secondary | ICD-10-CM | POA: Diagnosis not present

## 2015-08-29 DIAGNOSIS — Q762 Congenital spondylolisthesis: Secondary | ICD-10-CM | POA: Diagnosis not present

## 2015-08-29 DIAGNOSIS — E871 Hypo-osmolality and hyponatremia: Secondary | ICD-10-CM | POA: Diagnosis not present

## 2015-08-29 DIAGNOSIS — E119 Type 2 diabetes mellitus without complications: Secondary | ICD-10-CM | POA: Diagnosis not present

## 2015-08-29 DIAGNOSIS — Z79899 Other long term (current) drug therapy: Secondary | ICD-10-CM

## 2015-08-29 DIAGNOSIS — F988 Other specified behavioral and emotional disorders with onset usually occurring in childhood and adolescence: Secondary | ICD-10-CM

## 2015-08-29 LAB — BASIC METABOLIC PANEL
BUN: 21 mg/dL (ref 7–25)
CALCIUM: 10.2 mg/dL (ref 8.6–10.4)
CHLORIDE: 94 mmol/L — AB (ref 98–110)
CO2: 27 mmol/L (ref 20–31)
CREATININE: 0.78 mg/dL (ref 0.50–0.99)
Glucose, Bld: 310 mg/dL — ABNORMAL HIGH (ref 65–99)
Potassium: 3.9 mmol/L (ref 3.5–5.3)
Sodium: 133 mmol/L — ABNORMAL LOW (ref 135–146)

## 2015-08-29 LAB — LIPID PANEL
CHOL/HDL RATIO: 3 ratio (ref <=5.0)
Cholesterol: 196 mg/dL (ref 125–200)
HDL: 66 mg/dL (ref >=46)
LDL CALC: 97 mg/dL (ref <130)
TRIGLYCERIDES: 164 mg/dL — AB (ref <150)
VLDL: 33 mg/dL — AB (ref <30)

## 2015-08-29 LAB — POCT GLYCOSYLATED HEMOGLOBIN (HGB A1C): Hemoglobin A1C: 8.1

## 2015-08-29 MED ORDER — METHYLPHENIDATE HCL 10 MG PO TABS: 20.0000 mg | ORAL_TABLET | Freq: Three times a day (TID) | ORAL | Status: DC

## 2015-08-29 MED ORDER — HYDROCODONE-ACETAMINOPHEN 7.5-325 MG PO TABS: 1.0000 | ORAL_TABLET | Freq: Four times a day (QID) | ORAL | Status: DC | PRN

## 2015-08-29 NOTE — Patient Instructions (Signed)
Let Dr Karinna Beadles know if your Januvia causes pancreatitis pain again.  Would then swtch to a medication called Jardiance which blocks glucose absorption at the Kidneys so glucose goes out in the urine.

## 2015-08-30 ENCOUNTER — Encounter: Payer: Self-pay | Admitting: Family Medicine

## 2015-08-30 ENCOUNTER — Telehealth: Payer: Self-pay | Admitting: Family Medicine

## 2015-08-30 DIAGNOSIS — E871 Hypo-osmolality and hyponatremia: Secondary | ICD-10-CM | POA: Insufficient documentation

## 2015-08-30 HISTORY — DX: Hypo-osmolality and hyponatremia: E87.1

## 2015-08-30 MED ORDER — VITAMIN B-12 1000 MCG PO TABS: 1000.0000 ug | ORAL_TABLET | Freq: Every day | ORAL | Status: DC

## 2015-08-30 MED ORDER — AMLODIPINE BESY-BENAZEPRIL HCL 10-20 MG PO CAPS: 1.0000 | ORAL_CAPSULE | Freq: Every day | ORAL | Status: DC

## 2015-08-30 MED ORDER — NAPROXEN 500 MG PO TABS: 500.0000 mg | ORAL_TABLET | Freq: Two times a day (BID) | ORAL | Status: DC

## 2015-08-30 MED ORDER — OMEPRAZOLE 20 MG PO TBEC: 20.0000 mg | DELAYED_RELEASE_TABLET | Freq: Every day | ORAL | Status: DC | PRN

## 2015-08-30 MED ORDER — SITAGLIPTIN PHOSPHATE 100 MG PO TABS: 100.0000 mg | ORAL_TABLET | Freq: Every day | ORAL | Status: DC

## 2015-08-30 MED ORDER — ATORVASTATIN CALCIUM 40 MG PO TABS: 40.0000 mg | ORAL_TABLET | Freq: Every day | ORAL | Status: DC

## 2015-08-30 MED ORDER — ALBUTEROL SULFATE HFA 108 (90 BASE) MCG/ACT IN AERS: 2.0000 | INHALATION_SPRAY | Freq: Four times a day (QID) | RESPIRATORY_TRACT | Status: DC | PRN

## 2015-08-30 MED ORDER — FLUTICASONE PROPIONATE 50 MCG/ACT NA SUSP: NASAL | Status: DC

## 2015-08-30 MED ORDER — DOCUSATE SODIUM 100 MG PO CAPS: 100.0000 mg | ORAL_CAPSULE | Freq: Two times a day (BID) | ORAL | Status: DC

## 2015-08-30 MED ORDER — FEXOFENADINE HCL 180 MG PO TABS: 180.0000 mg | ORAL_TABLET | Freq: Every day | ORAL | Status: DC

## 2015-08-30 MED ORDER — HYDROCHLOROTHIAZIDE 25 MG PO TABS: 12.5000 mg | ORAL_TABLET | Freq: Every day | ORAL | Status: DC

## 2015-08-30 NOTE — Telephone Encounter (Signed)
Spoke with Mrs Sprinkle.  I recommended she decrease her HCTZ to half-tablet dailyinstead of a whole 25 mg tablet daily to see if her serum sodium will return to more normal serum range without loss of BP control. She agreed to make the change in HCTZ dose.   Will recheck serum sodium on next ov in 3 months.

## 2015-08-30 NOTE — Assessment & Plan Note (Signed)
Established problem. Stable. Patient's ablity to complete instrumental activities for herself and her family is facilitated by CNS stimulant therapy with Ritalin  Continue current therapy. Prescription written for 3 months supply Ritalin which she reports she can get filled at [facility].

## 2015-08-30 NOTE — Assessment & Plan Note (Addendum)
  Adequate symptom control. Tolerating Vicodan and Naprosyn medication. No evidence of aberrant medication behaviors. [name] did not find Baclofen helpful for muscle spasm component of back pain.  She has returned to use of methacarbamol. Given that she is relatively health and functional without cocurrent cognitive or other CNS impairments, will continue Rx of methacarbamol despite it potential for adverse effects in older adults.  Could ask pt to try trial of tizandine in future instead of methacarbamol.  Marland Kitchen

## 2015-08-30 NOTE — Progress Notes (Signed)
Subjective:    Patient ID: [name], female    DOB: 16-Feb-1951, 64 y.o.   MRN: [id number]  HPI Problem List Items Addressed This Visit     Cardiovascular and Mediastinum   HYPERTENSION, BENIGN SYSTEMIC  Disease Monitoring  Blood pressure range: running 130s/70s - 80s at home  Chest pain: no   Dyspnea: no   Claudication: no   Medication compliance: yes  Medication Side Effects  Lightheadedness: no   Urinary frequency: no   Edema: no     Preventitive Healthcare:  Exercise: no   Diet Pattern: fair  Salt Restriction: no     Endocrine   DIABETES MELLITUS II, UNCOMPLICATED - Primary CHRONIC DIABETES  Disease Monitoring  Blood Sugar Ranges: 110 to 200  Polyuria: no   Visual problems: no   Medication Compliance: yes, taking Januvia and metformin.  Medication Side Effects  Hypoglycemia: yes              Epigastic pain with radiation into back that pt thought may be pancreatitis.  She stopped the Januvia (DPP-4 inhibitor) with subsequent resolution of pain.  She restarted the Januvia about one week ago without recurrence of abdominal pain.    Preventitive Health Care             Daily Aspirin: No             Statin: Yes   Eye Exam: plans to go soon  Foot Exam: no concerns  Diet pattern: fair  Exercise: no       Musculoskeletal and Integument   OSTEOARTHRITIS, MULTI SITES - 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 No pars defect or acute fracture is seen. There is multilevel spondylosis with disc space loss from L2-L3 through L4- L5. Facet degenerative changes are present inferiorly). - Quality: There is no swelling, no redness, or no increased warmth. The pain is described as 1/10. occasionally. There is no burning.  - Pattern: intermittent flares on low grade continuous ache  - Duration: years Associated  symptoms: Includes stiffness and weakness. There is no sleep loss and no instability.  Hip Pain: no knee pain: no.  Radicular type pain: no Modifying factors:weight bearing pain and pain with ambulation. There is no sitting, and no night pain. There is no pain with weather change. Norco and naproxen medications do help  Assistive devices: none     ADHD - Longstanding problem - Takes Ritalin 20 mg three times daily  - No chest pain, no shortness of breath, no tics, no headaches - Improves ability to organize and focus on instrumental tasks.  No smoking ever No alcohol use   Review of Systems see HPI No falls No numbness in feet    Objective:   Physical Exam VS reviewed GEN: Alert, Cooperative, Groomed, NAD HEENT: PERRL; EAC bilaterally not occluded, TM's translucent with normal LM, (+) LR;                No cervical LAN, No thyromegaly, No palpable masses COR: RRR, No M/G/R, No JVD, Normal PMI size and location LUNGS: BCTA, No Acc mm use, speaking in full sentences ABDOMEN: (+)BS, soft,  EXT: No peripheral leg edema. Feet without deformity or lesions. Palpable bilateral pedal pulses.  SKIN: No lesion nor rashes of face/trunk/extremities Neuro: Oriented to person, place, and time;   Gait:  Normal speed, No significant path deviation, Step through +,  Psych: Normal affect/thought/speech/language Diabetic Foot Check -  Appearance - no lesions, ulcers or calluses Skin - no unusual pallor or redness Monofilament testing -  Right - Great toe, medial, central, lateral ball and posterior foot intact Left - Great toe, medial, central, lateral ball and posterior foot intact        Assessment & Plan:

## 2015-08-30 NOTE — Assessment & Plan Note (Addendum)
Assessment:    Dyslipidemia under good control.    Plan:    1. Continue dietary measures. 2. Continue regular exercise. 3. Lipid-lowering medications: Atorvastatin  4. Given history of slightly elevated transaminases in August 2014. Will recheck LFTs on next ov. Pt is HCV Antibody negative.  Will check Hepatitis B surface Ab next visit to look for HBV infection. No known RF for HBV.  4. Follow up in 6 month.

## 2015-08-30 NOTE — Assessment & Plan Note (Signed)
Adequate blood pressure control.  No evidence of new end organ damage.  Tolerating medication without significant adverse effects.  Plan to continue current blood pressure regiment but with recommendation to reduce HCTZ down to 12.5 mg daily given new reduction in serum sodium that may be due to thiazide.  Basic Metabolic Panel:    Component Value Date/Time   NA 133* 08/29/2015 1240   K 3.9 08/29/2015 1240   CL 94* 08/29/2015 1240   CO2 27 08/29/2015 1240   BUN 21 08/29/2015 1240   CREATININE 0.78 08/29/2015 1240   CREATININE 0.64 04/26/2012 1425   GLUCOSE 310* 08/29/2015 1240   CALCIUM 10.2 08/29/2015 1240

## 2015-08-30 NOTE — Assessment & Plan Note (Signed)
Lab Results  Component Value Date   HGBA1C 8.1 08/29/2015   HGBA1C 7.2 01/24/2015   HGBA1C 9.3 06/07/2014   Lab Results  Component Value Date   LDLCALC 97 08/29/2015   CREATININE 0.78 08/29/2015   Established problem worsened.  Decline in glycemic control.  Working cause is patient's stopping of DDP-4 inhibitor when she developed mosderate to severe abdominal pain into back that pt thought may be pancreatitis.  She stopped the Januvia with resolution of abd pain soon after.  She restarted Januvia this last week and is monitoring for recurrence of symptoms. If abdominal pain sxs recur, [name] will stop the Januvia again, and inofrm our office of the symptoms and med change.    Would consider use of SGLT2 inhibitor such as Empagliflozine or Liraglutide if necessary to stop Januvia.  Recheck A1c at 61-month follow up ov. Pt's risk CV event 10-years is 19%, with low risk GI bleed, so will recommend Aspirin low dose next visit for reduction CV events and CRC.  Need to follow up if patient goes for ophth exam.

## 2015-08-30 NOTE — Assessment & Plan Note (Signed)
BMP Latest Ref Rng 08/29/2015 01/08/2014 06/05/2013  Glucose 65 - 99 mg/dL 310(H) 105(H) 263(H)  BUN 7 - 25 mg/dL 21 11 13   Creatinine 0.50 - 0.99 mg/dL 0.78 0.79 0.77  Sodium 135 - 146 mmol/L 133(L) 136 134(L)  Potassium 3.5 - 5.3 mmol/L 3.9 3.9 3.7  Chloride 98 - 110 mmol/L 94(L) 96 98  CO2 20 - 31 mmol/L 27 24 24   Calcium 8.6 - 10.4 mg/dL 10.2 9.4 9.9   New problem with further workup. Currently asymptomatic. Suspect ECF decreased volume issue possibly from thiazide and glycosuria. Recommend reduction of HCTZ to 12.5 mg daily.  Restart of Januvia (DPP-4 inhibitor). Recheck BMP at next ov in 3 months or sooner if suspect symptoms develop.

## 2015-09-03 ENCOUNTER — Other Ambulatory Visit: Payer: Self-pay | Admitting: Family Medicine

## 2015-09-03 NOTE — Telephone Encounter (Signed)
Needs refill on metaformin

## 2015-09-06 MED ORDER — METFORMIN HCL ER 500 MG PO TB24: 1000.0000 mg | ORAL_TABLET | Freq: Every day | ORAL | Status: DC

## 2015-10-26 ENCOUNTER — Other Ambulatory Visit: Payer: Self-pay | Admitting: Family Medicine

## 2015-11-01 ENCOUNTER — Emergency Department (INDEPENDENT_AMBULATORY_CARE_PROVIDER_SITE_OTHER)
Admission: EM | Admit: 2015-11-01 | Discharge: 2015-11-01 | Disposition: A | Discharge status: Home / Self Care | Payer: Medicare Other | Source: Home / Self Care | Attending: Family Medicine | Admitting: Family Medicine

## 2015-11-01 ENCOUNTER — Encounter (HOSPITAL_COMMUNITY): Payer: Self-pay | Admitting: *Deleted

## 2015-11-01 DIAGNOSIS — J0101 Acute recurrent maxillary sinusitis: Secondary | ICD-10-CM

## 2015-11-01 MED ORDER — DOXYCYCLINE HYCLATE 100 MG PO CAPS: 100.0000 mg | ORAL_CAPSULE | Freq: Two times a day (BID) | ORAL | Status: DC

## 2015-11-01 NOTE — ED Provider Notes (Signed)
CSN: [id number]     Arrival date & time 11/01/15  1845 History   First MD Initiated Contact with Patient 11/01/15 2022     Chief Complaint  Patient presents with  . Facial Pain   (Consider location/radiation/quality/duration/timing/severity/associated sxs/prior Treatment) Patient is a 65 y.o. female presenting with URI. The history is provided by the patient.  URI Presenting symptoms: congestion, facial pain and rhinorrhea   Presenting symptoms: no fever   Severity:  Moderate Onset quality:  Gradual Duration:  1 week Progression:  Unchanged Chronicity:  New Associated symptoms: sinus pain and swollen glands     Past Medical History  Diagnosis Date  . History of right greater trochanteric bursitis 11/07/2009  . ATTENTION DEFICIT, W/O HYPERACTIVITY 12/16/2006  . DIABETES MELLITUS II, UNCOMPLICATED [id number]  . HYPERTENSION, BENIGN SYSTEMIC 12/16/2006  . HYPERCHOLESTEROLEMIA 02/28/2007  . OSTEOARTHRITIS, MULTI SITES 12/16/2006  . DIFFUSE IDIOPATHIC SKELETAL HYPEROSTOSIS 03/29/2008  . Seasonal allergies   . History of blood transfusion 10/2004    with knee replacement  . PONV (postoperative nausea and vomiting)     last surgery only  . Ulcer   . GERD (gastroesophageal reflux disease)   . Anemia     hx  . 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  . SACROILIITIS, HISTORY OF 01/08/2009    Qualifier: History of  By: McDiarmid MD, Sherren Mocha    . Cervical spondylosis with myelopathy, History of 04/28/2012  . INCONTINENCE, URGE 12/16/2006    Qualifier: History of  By: McDiarmid MD, Sherren Mocha    . History of peptic ulcer 12/16/2006    UGI series PUD - 10/19/1998    . HEMORRHOIDS, NOS 12/16/2006    Qualifier: History of  By: McDiarmid MD, Sherren Mocha    . H/O abnormal mammogram 07/19/2009    S/P Breast Biopsy ([place], 07/2009): Benign findings.    . Acute sinusitis 12/21/2014  . Acute MEE (middle ear effusion) 06/07/2014  . Perennial allergic rhinitis with seasonal variation 12/16/2006          Past Surgical History  Procedure Laterality Date  . Replacement total knee bilateral    . Nm myoview ltd  04/2004    Cardiolite:EF63%, no ischemia - 05/01/2004,   . Colonoscopy w/ polypectomy  12/2000    colonoscopy (Dr Collene Mares) int. hemorrhoids &  nonneoplatic colon polyps - 01/10/2001,   . Endometrial biopsy  08/2004    Endometrial Biopsy - 09/01/2004,  . Colonoscopy w/ polypectomy  01/2012    Dr Hilarie Fredrickson (GI).sigmoid colon, hyperlastic polyp  . Breast biopsy  07/2009    S/P Breast Biopsy [facility], [place], 07/2009): Benign findings.  (10/27/2010)  . Anterior cervical decomp/discectomy fusion  04/28/2012    Procedure: ANTERIOR CERVICAL DECOMPRESSION/DISCECTOMY FUSION 3 LEVELS;  Surgeon: Ophelia Charter, MD;  Location: [facility];  Service: Neurosurgery;  Laterality: N/A;  Cervical three-four,Cervical four-five,Cervical five-six anterior cervical decompression with fusion interbody prothesis plating and bonegraft   Family History  Problem Relation Age of Onset  . Hypertension Mother   . Arthritis Mother   . Diabetes type II Sister   . Kidney failure Sister   . Colon cancer Neg Hx   . Esophageal cancer Neg Hx   . Rectal cancer Neg Hx   . Stomach cancer Neg Hx    Social History  Substance Use Topics  . Smoking status: Former Smoker -- 1.00 packs/day for 4 years    Types: Cigarettes    Quit date: 10/20/1979  . Smokeless tobacco:  Never Used  . Alcohol Use: No   OB History    No data available     Review of Systems  Constitutional: Negative for fever.  HENT: Positive for congestion, postnasal drip, rhinorrhea and sinus pressure.   Respiratory: Negative.   Cardiovascular: Negative.   Gastrointestinal: Negative.   All other systems reviewed and are negative.   Allergies  Carvedilol; Celecoxib; and Diclofenac-misoprostol  Home Medications   Prior to Admission medications   Medication Sig Start Date End Date Taking? Authorizing Provider  albuterol  (PROVENTIL HFA;VENTOLIN HFA) 108 (90 BASE) MCG/ACT inhaler Inhale 2 puffs into the lungs every 6 (six) hours as needed for wheezing or shortness of breath. 08/30/15   Blane Ohara McDiarmid, MD  amLODipine-benazepril (LOTREL) 10-20 MG capsule Take 1 capsule by mouth daily. 08/30/15   Blane Ohara McDiarmid, MD  atorvastatin (LIPITOR) 40 MG tablet Take 1 tablet (40 mg total) by mouth daily. 08/30/15   Blane Ohara McDiarmid, MD  Coenzyme Q10 (COQ10 PO) Take by mouth.    Historical Provider, MD  docusate sodium (RA COL-RITE) 100 MG capsule Take 1 capsule (100 mg total) by mouth 2 (two) times daily. 08/30/15   Blane Ohara McDiarmid, MD  doxycycline (VIBRAMYCIN) 100 MG capsule Take 1 capsule (100 mg total) by mouth 2 (two) times daily. 11/01/15   Billy Fischer, MD  fexofenadine (ALLEGRA) 180 MG tablet Take 1 tablet (180 mg total) by mouth daily. 08/30/15   Blane Ohara McDiarmid, MD  fluticasone Asencion Islam) 50 MCG/ACT nasal spray Two sprays into each nostril daily 08/30/15   Blane Ohara McDiarmid, MD  glucose blood (ACCU-CHEK SMARTVIEW) test strip 1 each by Other route 2 (two) times daily. Use as instructed 05/31/15   Blane Ohara McDiarmid, MD  hydrochlorothiazide (HYDRODIURIL) 25 MG tablet Take 0.5 tablets (12.5 mg total) by mouth daily. 08/30/15   Blane Ohara McDiarmid, MD  HYDROcodone-acetaminophen (NORCO) 7.5-325 MG tablet Take 1 tablet by mouth every 6 (six) hours as needed for moderate pain. 08/29/15   Blane Ohara McDiarmid, MD  metFORMIN (GLUCOPHAGE-XR) 500 MG 24 hr tablet Take 2 tablets (1,000 mg total) by mouth daily. 09/06/15   Blane Ohara McDiarmid, MD  methocarbamol (ROBAXIN) 750 MG tablet TAKE 1 TABLET EVERY 6 HOURS AS NEEDED FOR MUSCLE SPASMS 10/29/15   Blane Ohara McDiarmid, MD  methylphenidate (RITALIN) 10 MG tablet Take 2 tablets (20 mg total) by mouth 3 (three) times daily. 08/29/15 10/27/15  Blane Ohara McDiarmid, MD  naproxen (NAPROSYN) 500 MG tablet Take 1 tablet (500 mg total) by mouth 2 (two) times daily with a meal. 08/30/15   Blane Ohara McDiarmid, MD   Omeprazole 20 MG TBEC Take 1 tablet (20 mg total) by mouth daily as needed. For acid reflux 08/30/15   Blane Ohara McDiarmid, MD  sitaGLIPtin (JANUVIA) 100 MG tablet Take 1 tablet (100 mg total) by mouth daily. 08/30/15   Blane Ohara McDiarmid, MD  triamcinolone cream (KENALOG) 0.1 % Apply 1 application topically 2 (two) times daily. 09/21/13   Blane Ohara McDiarmid, MD  vitamin B-12 (CYANOCOBALAMIN) 1000 MCG tablet Take 1 tablet (1,000 mcg total) by mouth daily. 08/30/15   Blane Ohara McDiarmid, MD   Meds Ordered and Administered this Visit  Medications - No data to display  BP 145/64 mmHg  Pulse 114  Temp(Src) 98.6 F (37 C) (Oral)  Resp 18  SpO2 99% No data found.   Physical Exam  Constitutional: She is oriented to person, place, and time. She appears well-developed and well-nourished.  No distress.  HENT:  Right Ear: External ear normal.  Left Ear: External ear normal.  Nose: Mucosal edema and rhinorrhea present. Right sinus exhibits maxillary sinus tenderness. Left sinus exhibits maxillary sinus tenderness.  Mouth/Throat: Oropharynx is clear and moist.  Neck: Normal range of motion. Neck supple.  Cardiovascular: Normal heart sounds and intact distal pulses.   Pulmonary/Chest: Effort normal and breath sounds normal.  Lymphadenopathy:    She has no cervical adenopathy.  Neurological: She is alert and oriented to person, place, and time.  Skin: Skin is warm and dry.  Nursing note and vitals reviewed.   ED Course  Procedures (including critical care time)  Labs Review Labs Reviewed - No data to display  Imaging Review No results found.   Visual Acuity Review  Right Eye Distance:   Left Eye Distance:   Bilateral Distance:    Right Eye Near:   Left Eye Near:    Bilateral Near:         MDM   1. Acute recurrent maxillary sinusitis        Billy Fischer, MD 11/01/15 2054

## 2015-11-01 NOTE — ED Notes (Signed)
l  Side  Face   Pain   And  Upper  Gums         Pt  Reports       Foul   Smelling   Drainage      Symptoms  Began  With a  Cold  About  1  Week  Ago          Sinus    Problems  In past

## 2015-11-04 ENCOUNTER — Other Ambulatory Visit: Payer: Self-pay | Admitting: Family Medicine

## 2015-11-04 ENCOUNTER — Encounter: Payer: Self-pay | Admitting: Family Medicine

## 2015-11-04 ENCOUNTER — Telehealth: Payer: Self-pay | Admitting: Family Medicine

## 2015-11-04 ENCOUNTER — Ambulatory Visit (INDEPENDENT_AMBULATORY_CARE_PROVIDER_SITE_OTHER): Payer: Medicare Other | Admitting: Family Medicine

## 2015-11-04 VITALS — BP 144/59 | HR 96 | Temp 98.4°F | Ht 62.0 in | Wt 201.3 lb

## 2015-11-04 DIAGNOSIS — M47816 Spondylosis without myelopathy or radiculopathy, lumbar region: Secondary | ICD-10-CM

## 2015-11-04 DIAGNOSIS — M47817 Spondylosis without myelopathy or radiculopathy, lumbosacral region: Secondary | ICD-10-CM

## 2015-11-04 DIAGNOSIS — J019 Acute sinusitis, unspecified: Secondary | ICD-10-CM

## 2015-11-04 DIAGNOSIS — B9689 Other specified bacterial agents as the cause of diseases classified elsewhere: Secondary | ICD-10-CM

## 2015-11-04 DIAGNOSIS — M159 Polyosteoarthritis, unspecified: Secondary | ICD-10-CM

## 2015-11-04 DIAGNOSIS — M150 Primary generalized (osteo)arthritis: Secondary | ICD-10-CM

## 2015-11-04 DIAGNOSIS — Q762 Congenital spondylolisthesis: Secondary | ICD-10-CM

## 2015-11-04 MED ORDER — LEVOFLOXACIN 500 MG PO TABS: 500.0000 mg | ORAL_TABLET | Freq: Every day | ORAL | Status: DC

## 2015-11-04 NOTE — Progress Notes (Signed)
    Subjective   [name] is a 65 y.o. female that presents for a same day visit  1. Sinus issues: Symptoms started about one week and a half with coughing and sniffing. Symptoms progressed to feeling like she had a mild toothache one week ago. Three days ago, she blew her nose and had foul smelling discharge. She has associated left sided headache and pain behind her eye. SHe has been using Norco which helps relieve some of the pain for a little while. She has been using Flonase intermittently which has not helped. She has not used her Allegra. She was seen at urgent care and prescribed doxycycline for acute bacterial sinusitis. She has a history of recurrent sinusitis. She has never seen an ENT. Overall, she feels like her symptoms have worsened since starting treatment.  ROS Per HPI  Social History  Substance Use Topics  . Smoking status: Former Smoker -- 1.00 packs/day for 4 years    Types: Cigarettes    Quit date: 10/20/1979  . Smokeless tobacco: Never Used  . Alcohol Use: No    Allergies  Allergen Reactions  . Carvedilol [Coreg] Shortness Of Breath and Other (See Comments)    Chest tightness  . Celecoxib Palpitations    REACTION: palpitations  . Diclofenac-Misoprostol Palpitations    REACTION: palpitation    Objective   BP 144/59 mmHg  Pulse 96  Temp(Src) 98.4 F (36.9 C) (Oral)  Ht 5\' 2"  (1.575 m)  Wt 201 lb 4.8 oz (91.309 kg)  BMI 36.81 kg/m2  General: Well appearing HEENT:   Head:  Normocephalic, no tenderness over temporal area. Left frontal and maxillary sinus tenderness  Eyes: Pupils equal and reactive to light/accomodation. Extraocular movements intact bilaterally. No photophobia  Ears: Tympanic membranes normal bilaterally.  Nose/Throat: Nares patent bilaterally. Oropharnx clear and moist.  Neck: No cervical adenopathy bilaterally  Assessment and Plan   1. Acute bacterial sinusitis - appears to be failing initial therapy - would prefer to  prescribe high dose Augmentin, however, pharmacies have not had this in stock lately - may need ENT referral if continues to fail treatment with history of recurrent sinusitis - continue medications for allergic rhinitis - levofloxacin (LEVAQUIN) 500 MG tablet; Take 1 tablet (500 mg total) by mouth daily.  Dispense: 10 tablet; Refill: 0

## 2015-11-04 NOTE — Telephone Encounter (Signed)
Pt was seen today by Dr. Lonny Prude and given a prescription of Levofloxacin, but one of the side effects is nerve damage and she already has a lot of nerve damage. She would like the doctor to call her something else in. [name]

## 2015-11-04 NOTE — Patient Instructions (Signed)
Thank you for coming to see me today. It was a pleasure. Today we talked about:   Acute sinusitis: I will prescribe a different antibiotic for you. Please take this for 10 days. If no improvement in about one week, please follow-up again as we may need to look further into this or refer you to the ear/nose/throat doctor  Please make an appointment to see Dr. McDiarmid for follow-up of your chronic issues.  If you have any questions or concerns, please do not hesitate to call the office at [phone].  Sincerely,  Cordelia Poche, MD

## 2015-11-05 ENCOUNTER — Other Ambulatory Visit: Payer: Self-pay | Admitting: Family Medicine

## 2015-11-05 DIAGNOSIS — M150 Primary generalized (osteo)arthritis: Secondary | ICD-10-CM

## 2015-11-05 DIAGNOSIS — M159 Polyosteoarthritis, unspecified: Secondary | ICD-10-CM

## 2015-11-05 DIAGNOSIS — M47817 Spondylosis without myelopathy or radiculopathy, lumbosacral region: Secondary | ICD-10-CM

## 2015-11-05 DIAGNOSIS — Q762 Congenital spondylolisthesis: Secondary | ICD-10-CM

## 2015-11-05 DIAGNOSIS — M47816 Spondylosis without myelopathy or radiculopathy, lumbar region: Secondary | ICD-10-CM

## 2015-11-06 MED ORDER — AZITHROMYCIN 500 MG PO TABS: 500.0000 mg | ORAL_TABLET | Freq: Every day | ORAL | Status: DC

## 2015-11-06 MED ORDER — HYDROCODONE-ACETAMINOPHEN 7.5-325 MG PO TABS: 1.0000 | ORAL_TABLET | Freq: Four times a day (QID) | ORAL | Status: DC | PRN

## 2015-11-06 NOTE — Telephone Encounter (Signed)
II spoke with [name] by phone. She reports that she did not take the Levoquin because she was fearful of the peripheral neuropathy ADE.  She has had good response to Azithromycin in past for her sinusitis symptoms.  Her sinusitis like symptoms duration is approaching 2 weeks.   Rx sent online to her [facility] for Aztihromycin 500 mg daily for 5 days. Recommended scheduled acetaminophen 500 mg QID Ocean spray prn Trial of Afrin or neosynephrine nasal sprays for temporary relief of facial pain and pressure.  Will refill her monthly hydrocodone/apap 7.5/325 Patient told to not take the Levaquin since starting Azithromycin.

## 2015-11-12 NOTE — Telephone Encounter (Signed)
Needs refill on hydrocodone

## 2015-11-14 ENCOUNTER — Encounter: Payer: Self-pay | Admitting: Family Medicine

## 2015-11-14 ENCOUNTER — Other Ambulatory Visit: Payer: Self-pay | Admitting: Family Medicine

## 2015-11-14 MED ORDER — HYDROCODONE-ACETAMINOPHEN 7.5-325 MG PO TABS: 1.0000 | ORAL_TABLET | Freq: Four times a day (QID) | ORAL | Status: DC | PRN

## 2015-11-14 NOTE — Progress Notes (Signed)
Patient ID: [name], female   DOB: September 02, 1951, 65 y.o.   MRN: [id number] Prescription was inadvertently created for hydrocodone-apap 7.5/325 mg tab disp#120 dated 11/14/15. A prescription for hydrocodone-apap 7.5/325 mg tab, disp #120 had already been written and picked up from [facility] front desk and signed for by Roney Marion, patient's dgt.   The 11/14/15 hydrocodone-apap prescription was destroyed by me.

## 2015-11-25 ENCOUNTER — Telehealth: Payer: Self-pay | Admitting: Family Medicine

## 2015-11-25 NOTE — Telephone Encounter (Signed)
Pt states she is still fighting the sinus infection. Drainage from nostrils and foul odor. She would like to know if there is any other treatment that could help her. Please advise.

## 2015-11-25 NOTE — Telephone Encounter (Signed)
Will forward to MD to advise. Jillian Warth,CMA  

## 2015-11-27 MED ORDER — AZITHROMYCIN 500 MG PO TABS: 500.0000 mg | ORAL_TABLET | Freq: Every day | ORAL | Status: DC

## 2015-11-27 NOTE — Telephone Encounter (Signed)
Patient is aware of this. [name],CMA  

## 2015-11-27 NOTE — Telephone Encounter (Signed)
Please let patient know that a Z-pak sent to patient's pharmacy

## 2015-12-26 ENCOUNTER — Other Ambulatory Visit: Payer: Self-pay | Admitting: Family Medicine

## 2015-12-26 DIAGNOSIS — M47816 Spondylosis without myelopathy or radiculopathy, lumbar region: Secondary | ICD-10-CM

## 2015-12-26 DIAGNOSIS — I10 Essential (primary) hypertension: Secondary | ICD-10-CM

## 2015-12-26 DIAGNOSIS — Q762 Congenital spondylolisthesis: Secondary | ICD-10-CM

## 2015-12-26 DIAGNOSIS — M159 Polyosteoarthritis, unspecified: Secondary | ICD-10-CM

## 2015-12-26 DIAGNOSIS — M47817 Spondylosis without myelopathy or radiculopathy, lumbosacral region: Secondary | ICD-10-CM

## 2015-12-26 DIAGNOSIS — M150 Primary generalized (osteo)arthritis: Secondary | ICD-10-CM

## 2015-12-26 DIAGNOSIS — F988 Other specified behavioral and emotional disorders with onset usually occurring in childhood and adolescence: Secondary | ICD-10-CM

## 2015-12-26 NOTE — Telephone Encounter (Signed)
Pt called and needs refills sent to her online pharmacy Robaxin and Ritalin. Plus leave a prescription of Hydrocodone up front for pick up. Please call and let patient know when to come and pickup. jw

## 2015-12-27 MED ORDER — HYDROCHLOROTHIAZIDE 25 MG PO TABS: 12.5000 mg | ORAL_TABLET | Freq: Every day | ORAL | Status: DC

## 2015-12-27 MED ORDER — HYDROCODONE-ACETAMINOPHEN 7.5-325 MG PO TABS: 1.0000 | ORAL_TABLET | Freq: Four times a day (QID) | ORAL | Status: DC | PRN

## 2015-12-27 MED ORDER — METHYLPHENIDATE HCL 10 MG PO TABS: 20.0000 mg | ORAL_TABLET | Freq: Three times a day (TID) | ORAL | Status: DC

## 2015-12-27 MED ORDER — METHOCARBAMOL 750 MG PO TABS: ORAL_TABLET | ORAL | Status: DC

## 2015-12-27 NOTE — Telephone Encounter (Signed)
Patient is aware that scripts are ready for pick up.  Jazmin Hartsell,CMA  

## 2015-12-27 NOTE — Telephone Encounter (Signed)
Please let patient know her prescription(s) are available for pick up from the [facility] front desk.  

## 2016-01-08 ENCOUNTER — Other Ambulatory Visit: Payer: Self-pay | Admitting: Family Medicine

## 2016-01-15 ENCOUNTER — Ambulatory Visit (INDEPENDENT_AMBULATORY_CARE_PROVIDER_SITE_OTHER): Payer: Medicare Other | Admitting: Family Medicine

## 2016-01-15 ENCOUNTER — Encounter: Payer: Self-pay | Admitting: Family Medicine

## 2016-01-15 VITALS — BP 160/75

## 2016-01-15 DIAGNOSIS — Z0000 Encounter for general adult medical examination without abnormal findings: Secondary | ICD-10-CM

## 2016-01-15 DIAGNOSIS — M47816 Spondylosis without myelopathy or radiculopathy, lumbar region: Secondary | ICD-10-CM

## 2016-01-15 DIAGNOSIS — E871 Hypo-osmolality and hyponatremia: Secondary | ICD-10-CM | POA: Diagnosis not present

## 2016-01-15 DIAGNOSIS — M150 Primary generalized (osteo)arthritis: Secondary | ICD-10-CM

## 2016-01-15 DIAGNOSIS — J309 Allergic rhinitis, unspecified: Secondary | ICD-10-CM | POA: Diagnosis not present

## 2016-01-15 DIAGNOSIS — L821 Other seborrheic keratosis: Secondary | ICD-10-CM | POA: Insufficient documentation

## 2016-01-15 DIAGNOSIS — Z780 Asymptomatic menopausal state: Secondary | ICD-10-CM

## 2016-01-15 DIAGNOSIS — E119 Type 2 diabetes mellitus without complications: Secondary | ICD-10-CM

## 2016-01-15 DIAGNOSIS — I10 Essential (primary) hypertension: Secondary | ICD-10-CM | POA: Diagnosis not present

## 2016-01-15 DIAGNOSIS — M47817 Spondylosis without myelopathy or radiculopathy, lumbosacral region: Secondary | ICD-10-CM

## 2016-01-15 DIAGNOSIS — Z79899 Other long term (current) drug therapy: Secondary | ICD-10-CM | POA: Diagnosis not present

## 2016-01-15 DIAGNOSIS — J3089 Other allergic rhinitis: Secondary | ICD-10-CM

## 2016-01-15 DIAGNOSIS — Q762 Congenital spondylolisthesis: Secondary | ICD-10-CM

## 2016-01-15 DIAGNOSIS — M159 Polyosteoarthritis, unspecified: Secondary | ICD-10-CM

## 2016-01-15 DIAGNOSIS — J302 Other seasonal allergic rhinitis: Secondary | ICD-10-CM

## 2016-01-15 HISTORY — DX: Other seborrheic keratosis: L82.1

## 2016-01-15 LAB — POCT GLYCOSYLATED HEMOGLOBIN (HGB A1C): Hemoglobin A1C: 8.1

## 2016-01-15 MED ORDER — HYDROCODONE-ACETAMINOPHEN 7.5-325 MG PO TABS: 1.0000 | ORAL_TABLET | Freq: Four times a day (QID) | ORAL | Status: DC | PRN

## 2016-01-15 NOTE — Assessment & Plan Note (Signed)
New problem No further workup Reviewed reasons to seek re-examination of skin lesion. Reassurance and pt education material given; Monitor.

## 2016-01-15 NOTE — Assessment & Plan Note (Signed)
Established problem. Stable but only fair glycemic control.  No new end organ damage.  Januvia stopped by pt because of epigastric abdominal pain. Pt previously stopped Januvia because of abdominal pain that pt thought was pancreatitis. Re-exposure to Januvia associated with recurrence of abdominal pain.   Pt currently only taking 1000 mg Metformin daily.  Recommend she increase Metformin to 1500 to 2000 mg daily. RTC 3 months for A1C.  Recommend aspirin daily next visit if abdominal pain gone given CVE risk 19%.  Recommended that pt go for ophthalmic examination for diabetic retinopathy, glaucoma screening and other disorders of the aging eye.

## 2016-01-15 NOTE — Assessment & Plan Note (Signed)
Established problem Controlled Able to particilate in iADLs with use of Norco 7.5 and Robaxin No aberrant behaviors Rx for one month supply to fill in 01/26/16.

## 2016-01-15 NOTE — Patient Instructions (Signed)
Increase your Metformin to at least 3 tablets a day up to 5 tablets a day. Getting your annual eye exam for glaucoma and diabetic eye disease screening. We will get you schedule for a bone density test at [facility]. Dr Kimbella Heisler will call you if your tests are not good. Otherwise he will send you a letter.  If you sign up for MyChart online, you will be able to see your test results once Dr Tineka Uriegas has reviewed them.  If you do not hear from [place] with in 2 weeks please call our office        Seborrheic Keratosis Seborrheic keratosis is a common, noncancerous (benign) skin growth. This condition causes waxy, rough, tan, brown, or black spots to appear on the skin. These skin growths can be flat or raised. CAUSES The cause of this condition is not known. RISK FACTORS This condition is more likely to develop in:  People who have a family history of seborrheic keratosis.  People who are 82 or older.  People who are pregnant.  People who have had estrogen replacement therapy. SYMPTOMS This condition often occurs on the face, chest, shoulders, back, or other areas. These growths:  Are usually painless, but may become irritated and itchy.  Can be yellow, brown, black, or other colors.  Are slightly raised or have a flat surface.  Are sometimes rough or wart-like in texture.  Are often waxy on the surface.  Are round or oval-shaped.  Sometimes look like they are "stuck on."  Often occur in groups, but may occur as a single growth. DIAGNOSIS This condition is diagnosed with a medical history and physical exam. A sample of the growth may be tested (skin biopsy). You may need to see a skin specialist (dermatologist). TREATMENT Treatment is not usually needed for this condition, unless the growths are irritated or are often bleeding. You may also choose to have the growths removed if you do not like their appearance. Most commonly, these growths are treated with a  procedure in which liquid nitrogen is applied to "freeze" off the growth (cryosurgery). They may also be burned off with electricity or cut off. HOME CARE INSTRUCTIONS  Watch your growth for any changes.  Keep all follow-up visits as told by your health care provider. This is important.  Do not scratch or pick at the growth or growths. This can cause them to become irritated or infected. SEEK MEDICAL CARE IF:  You suddenly have many new growths.  Your growth bleeds, itches, or hurts.  Your growth suddenly becomes larger or changes color.   This information is not intended to replace advice given to you by your health care provider. Make sure you discuss any questions you have with your health care provider.   Document Released: 11/07/2010 Document Revised: 06/26/2015 Document Reviewed: 02/20/2015 Elsevier Interactive Patient Education [employer].

## 2016-01-15 NOTE — Progress Notes (Signed)
   Subjective:    Patient ID: [name], female    DOB: 04/05/1951, 65 y.o.   MRN: [id number]  HPI  HYPERTENSION  Disease Monitoring: Blood pressure range-running 130 to 140/ 70s at home Chest pain- no      Dyspnea- no  Medications: Compliance- yes Lightheadedness- no   Edema- no   DIABETES  Disease Monitoring: Blood Sugar ranges-150 to 200 range Polyuria- no New Visual problems- no  Medications: Compliance- taking metformin 500mg  two tablet a day instead of three a day.  Stopped Januvia two months ago because it was causing epigastric pain Hypoglycemic symptoms- no    HYPERLIPIDEMIA  Disease Monitoring: See symptoms for Hypertension  Medications: Compliance- no. Stopped taking Atorvastatin because she felt it was "making her not feel right" RUQ pain- no   Muscle aches- no    ROS See HPI above   PMH Smoking Status noted      Review of Systems See hpi    Objective:   Physical Exam  VS reviewed GEN: Alert, Cooperative, Groomed, NAD HEENT: PERRL; mild conjunctival bulbar injection bilaterally, normal eye lids, dry eyes bilaterally COR: RRR, No M/G/R, No JVD, Normal PMI size and location LUNGS: BCTA, No Acc mm use, speaking in full sentences EXT: No peripheral leg edema.  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

## 2016-01-15 NOTE — Assessment & Plan Note (Signed)
Rechecking today from 08/29/16.

## 2016-01-15 NOTE — Assessment & Plan Note (Signed)
Established problem worsened.  Pt unable to afford Allegra. Recommend restarting her Flonase Rx daily for remainder of Spring and early Summer.

## 2016-01-15 NOTE — Assessment & Plan Note (Signed)
Established problem Controlled based on home BP readings of 130s/70's Continue Lotrel, HCTZ Checking CMET today.

## 2016-01-15 NOTE — Assessment & Plan Note (Signed)
Handwritten Rx for screening mammogram given to [name] to take to [facility] at [facility] to obtain her delinquent mammogram.

## 2016-01-16 ENCOUNTER — Ambulatory Visit: Payer: TRICARE For Life (TFL) | Admitting: Family Medicine

## 2016-01-16 ENCOUNTER — Encounter: Payer: Self-pay | Admitting: Family Medicine

## 2016-01-16 LAB — COMPLETE METABOLIC PANEL WITH GFR
ALT: 28 U/L (ref 6–29)
AST: 21 U/L (ref 10–35)
Albumin: 4.2 g/dL (ref 3.6–5.1)
Alkaline Phosphatase: 69 U/L (ref 33–130)
BUN: 15 mg/dL (ref 7–25)
CALCIUM: 10 mg/dL (ref 8.6–10.4)
CHLORIDE: 98 mmol/L (ref 98–110)
CO2: 25 mmol/L (ref 20–31)
CREATININE: 0.73 mg/dL (ref 0.50–0.99)
GFR, EST NON AFRICAN AMERICAN: 86 mL/min (ref >=60)
Glucose, Bld: 224 mg/dL — ABNORMAL HIGH (ref 65–99)
POTASSIUM: 4.3 mmol/L (ref 3.5–5.3)
Sodium: 135 mmol/L (ref 135–146)
Total Bilirubin: 0.3 mg/dL (ref 0.2–1.2)
Total Protein: 7.5 g/dL (ref 6.1–8.1)

## 2016-03-23 ENCOUNTER — Other Ambulatory Visit: Payer: Self-pay | Admitting: Family Medicine

## 2016-03-23 DIAGNOSIS — M47817 Spondylosis without myelopathy or radiculopathy, lumbosacral region: Secondary | ICD-10-CM

## 2016-03-23 DIAGNOSIS — M47816 Spondylosis without myelopathy or radiculopathy, lumbar region: Secondary | ICD-10-CM

## 2016-03-23 DIAGNOSIS — M159 Polyosteoarthritis, unspecified: Secondary | ICD-10-CM

## 2016-03-23 DIAGNOSIS — Q762 Congenital spondylolisthesis: Secondary | ICD-10-CM

## 2016-03-23 DIAGNOSIS — M150 Primary generalized (osteo)arthritis: Secondary | ICD-10-CM

## 2016-03-23 NOTE — Telephone Encounter (Signed)
Pt called and needs a refill on her pain medication jw °

## 2016-03-24 MED ORDER — HYDROCODONE-ACETAMINOPHEN 7.5-325 MG PO TABS: 1.0000 | ORAL_TABLET | Freq: Four times a day (QID) | ORAL | Status: DC | PRN

## 2016-03-24 NOTE — Telephone Encounter (Signed)
Please let patient know her prescription(s) are available for pick up from the [facility] front desk.  

## 2016-03-24 NOTE — Telephone Encounter (Signed)
Patient is aware. Dontaye Hur,CMA  

## 2016-05-11 ENCOUNTER — Encounter: Payer: Self-pay | Admitting: *Deleted

## 2016-05-11 ENCOUNTER — Other Ambulatory Visit: Payer: Self-pay | Admitting: *Deleted

## 2016-05-11 DIAGNOSIS — M47817 Spondylosis without myelopathy or radiculopathy, lumbosacral region: Secondary | ICD-10-CM

## 2016-05-11 DIAGNOSIS — Q762 Congenital spondylolisthesis: Secondary | ICD-10-CM

## 2016-05-11 DIAGNOSIS — M159 Polyosteoarthritis, unspecified: Secondary | ICD-10-CM

## 2016-05-11 DIAGNOSIS — M47816 Spondylosis without myelopathy or radiculopathy, lumbar region: Secondary | ICD-10-CM

## 2016-05-11 DIAGNOSIS — M150 Primary generalized (osteo)arthritis: Secondary | ICD-10-CM

## 2016-05-11 MED ORDER — HYDROCODONE-ACETAMINOPHEN 7.5-325 MG PO TABS: 1.0000 | ORAL_TABLET | Freq: Four times a day (QID) | ORAL | 0 refills | Status: DC | PRN

## 2016-05-11 NOTE — Telephone Encounter (Signed)
Refill request sent to pcp. Crislyn Willbanks,CMA

## 2016-05-11 NOTE — Telephone Encounter (Signed)
Please let patient know her prescription(s) are available for pick up from the [facility] front desk.  

## 2016-05-11 NOTE — Telephone Encounter (Signed)
Patient calling requesting refill on hydrocodone.

## 2016-05-11 NOTE — Telephone Encounter (Signed)
LM for patient that script is ready for pick up. Jazmin Hartsell,CMA  

## 2016-06-03 ENCOUNTER — Ambulatory Visit: Payer: Medicare Other | Admitting: *Deleted

## 2016-06-22 ENCOUNTER — Other Ambulatory Visit: Payer: Self-pay | Admitting: Family Medicine

## 2016-06-22 DIAGNOSIS — Q762 Congenital spondylolisthesis: Secondary | ICD-10-CM

## 2016-06-22 DIAGNOSIS — M150 Primary generalized (osteo)arthritis: Secondary | ICD-10-CM

## 2016-06-22 DIAGNOSIS — M47816 Spondylosis without myelopathy or radiculopathy, lumbar region: Secondary | ICD-10-CM

## 2016-06-22 DIAGNOSIS — M159 Polyosteoarthritis, unspecified: Secondary | ICD-10-CM

## 2016-06-22 DIAGNOSIS — M47817 Spondylosis without myelopathy or radiculopathy, lumbosacral region: Secondary | ICD-10-CM

## 2016-06-22 DIAGNOSIS — F988 Other specified behavioral and emotional disorders with onset usually occurring in childhood and adolescence: Secondary | ICD-10-CM

## 2016-06-25 MED ORDER — HYDROCODONE-ACETAMINOPHEN 7.5-325 MG PO TABS: 1.0000 | ORAL_TABLET | Freq: Four times a day (QID) | ORAL | 0 refills | Status: DC | PRN

## 2016-06-25 MED ORDER — METHOCARBAMOL 750 MG PO TABS: ORAL_TABLET | ORAL | 1 refills | Status: DC

## 2016-06-25 MED ORDER — METHYLPHENIDATE HCL 10 MG PO TABS: 20.0000 mg | ORAL_TABLET | Freq: Three times a day (TID) | ORAL | 0 refills | Status: DC

## 2016-06-25 NOTE — Telephone Encounter (Signed)
Please let patient know her prescription for Norco and Ritalin are available for pick up from the [facility] front desk.  Prescription for Robaxin sent to patient's mail order pharmacy

## 2016-06-30 ENCOUNTER — Telehealth: Payer: Self-pay | Admitting: Family Medicine

## 2016-06-30 NOTE — Telephone Encounter (Signed)
Pt is having nerve pain in her left hand. It is sharp stabbing pain. Pt wants to know if there is a medication such as llyrica that could help with that.  Please advise

## 2016-06-30 NOTE — Telephone Encounter (Signed)
Patient has an upcoming appointment on 07/30/16.  Will forward to MD to advise on trying any new medication before. Jazmin Hartsell,CMA

## 2016-07-14 ENCOUNTER — Other Ambulatory Visit: Payer: Self-pay | Admitting: Family Medicine

## 2016-07-16 ENCOUNTER — Other Ambulatory Visit: Payer: Self-pay | Admitting: Family Medicine

## 2016-07-16 MED ORDER — PREGABALIN 50 MG PO CAPS: 50.0000 mg | ORAL_CAPSULE | Freq: Three times a day (TID) | ORAL | 0 refills | Status: DC

## 2016-07-30 ENCOUNTER — Ambulatory Visit (INDEPENDENT_AMBULATORY_CARE_PROVIDER_SITE_OTHER): Payer: Medicare Other | Admitting: Family Medicine

## 2016-07-30 ENCOUNTER — Encounter: Payer: Self-pay | Admitting: Family Medicine

## 2016-07-30 VITALS — BP 159/70 | HR 109 | Temp 98.3°F | Ht 62.0 in | Wt 206.0 lb

## 2016-07-30 DIAGNOSIS — E119 Type 2 diabetes mellitus without complications: Secondary | ICD-10-CM | POA: Diagnosis not present

## 2016-07-30 DIAGNOSIS — G5602 Carpal tunnel syndrome, left upper limb: Secondary | ICD-10-CM | POA: Diagnosis not present

## 2016-07-30 DIAGNOSIS — Z23 Encounter for immunization: Secondary | ICD-10-CM | POA: Diagnosis not present

## 2016-07-30 DIAGNOSIS — G5600 Carpal tunnel syndrome, unspecified upper limb: Secondary | ICD-10-CM | POA: Insufficient documentation

## 2016-07-30 LAB — POCT GLYCOSYLATED HEMOGLOBIN (HGB A1C): Hemoglobin A1C: 8.7

## 2016-07-30 NOTE — Patient Instructions (Addendum)
Your A1c was 8.7% which is up from 8.1% in March.   Add an additional Metformin 500 mg tablet daily to your current two tablets daily  Start Jardiance 10 mg tablet daily.  Watch for Yeast infections.   Pick up left wrist (Cock-up) splint for possible carpal Tunnel Syndrome. Wear Splint 24/7.  If you cannot get your Splint covered by Insurance, let us know, we will have you pick up splint from [place].   Return to Clinic in 2 to 3 weeks to assess response to splint

## 2016-07-31 ENCOUNTER — Encounter: Payer: Self-pay | Admitting: Family Medicine

## 2016-07-31 MED ORDER — EMPAGLIFLOZIN 10 MG PO TABS: 10.0000 mg | ORAL_TABLET | Freq: Every day | ORAL | 99 refills | Status: DC

## 2016-07-31 NOTE — Progress Notes (Signed)
   Subjective:    Patient ID: [name], female    DOB: 1951-08-23, 65 y.o.   MRN: [id number] [name] is alone Sources of clinical information for visit is/are patient and past medical records. Nursing assessment for this office visit was reviewed with the patient for accuracy and revision.   HPI  HYPERTENSION  Disease Monitoring: Blood pressure range- not checking at home Chest pain- no      Dyspnea- no  Medications: Compliance- yes Lightheadedness- no   Edema- no   DIABETES  Disease Monitoring: Blood Sugar ranges-running high Polyuria- no New Visual problems- no  Medications: Compliance- metformin XL 1000 mg qhs Hypoglycemic symptoms- no        ROS See HPI above   PMH Smoking Status noted    Review of Systems     Objective:   Physical Exam VS reviewed GEN: Alert, Cooperative, Groomed, NAD COR: RRR, No M/G/R, No JVD, Normal PMI size and location LUNGS: BCTA, No Acc mm use, speaking in full sentences Gait: Normal speed, No significant path deviation, Step through +,  Psych: Normal affect/thought/speech/language        Assessment & Plan:   Subjective:    [name] is a 65 y.o. female who presents for evaluation of left hand/finger pain. Onset was sudden, starting about 5 years ago. The pain is moderate, worsens with touch of fingers of left hand, There is associated tingling in all fingers but particularly in 2nd and 3rd digit.  Evaluation to date: Pt dx Carpal Tunnel Syndrome in 2012 by Dr McDiarmid.  Treatd  with wrist splint in neutral poistion with good response. . Treatment to date: avoidance of offending activity.  The following portions of the patient's history were reviewed and updated as appropriate: allergies, current medications, past family history, past medical history, past social history, past surgical history and problem list.  Review of Systems Musculoskeletal:negative for neck pain    Objective:    BP (!) 159/70   Pulse  (!) 109   Temp 98.3 F (36.8 C) (Oral)   Ht 5\' 2"  (1.575 m)   Wt 206 lb (93.4 kg)   BMI 37.68 kg/m  Left hand:  negative Phalen, positive Tinel and palpation of four fingers left hand illicited an unpleasant sensation with spontaneous hand withdrawal.   right hand:  normal exam, no swelling, tenderness, instability; ligaments intact, full ROM both hands, wrists, and finger joints   Negative Spurling test.   Diminished left hand grip compared to right.   Diminished thumb-pinkie stregth left compared to right.    Assessment:    Carpal tunnel syndrome    Plan:    Natural history and expected course discussed. Questions answered. Scientist, clinical (histocompatibility and immunogenetics) distributed. Rest, ice, compression, and elevation (RICE) therapy. Volar wrist splint to be worn 24/7. NSAIDs per medication orders. Follow up in 3 weeks.

## 2016-07-31 NOTE — Assessment & Plan Note (Signed)
Established problem worsened.  Lab Results  Component Value Date   HGBA1C 8.7 07/30/2016    Pt has not been exercising nor eating well bc of multiple recent stressors Increase Metformin XL to 500 mg in morning and 1000 XL mg in evening.  Start Jardiance 10 mg daily RTC in month for recheck

## 2016-08-06 ENCOUNTER — Other Ambulatory Visit: Payer: Self-pay | Admitting: Family Medicine

## 2016-08-06 DIAGNOSIS — J309 Allergic rhinitis, unspecified: Secondary | ICD-10-CM

## 2016-08-24 ENCOUNTER — Other Ambulatory Visit: Payer: Self-pay | Admitting: Family Medicine

## 2016-08-24 DIAGNOSIS — M47816 Spondylosis without myelopathy or radiculopathy, lumbar region: Secondary | ICD-10-CM

## 2016-08-24 DIAGNOSIS — Q762 Congenital spondylolisthesis: Secondary | ICD-10-CM

## 2016-08-24 DIAGNOSIS — M150 Primary generalized (osteo)arthritis: Secondary | ICD-10-CM

## 2016-08-24 DIAGNOSIS — M47817 Spondylosis without myelopathy or radiculopathy, lumbosacral region: Secondary | ICD-10-CM

## 2016-08-24 DIAGNOSIS — M159 Polyosteoarthritis, unspecified: Secondary | ICD-10-CM

## 2016-08-24 NOTE — Telephone Encounter (Signed)
Pt needs a refill on hydrocodone. Please advise. Thanks! ep

## 2016-08-25 MED ORDER — HYDROCODONE-ACETAMINOPHEN 7.5-325 MG PO TABS: 1.0000 | ORAL_TABLET | Freq: Four times a day (QID) | ORAL | 0 refills | Status: DC | PRN

## 2016-08-25 NOTE — Telephone Encounter (Signed)
Patient is aware. [name],CMA  

## 2016-08-25 NOTE — Telephone Encounter (Signed)
Please let patient know her prescription(s) are available for pick up from the [facility] front desk.  

## 2016-09-05 ENCOUNTER — Other Ambulatory Visit: Payer: Self-pay | Admitting: Family Medicine

## 2016-09-17 ENCOUNTER — Ambulatory Visit (INDEPENDENT_AMBULATORY_CARE_PROVIDER_SITE_OTHER): Payer: Medicare Other | Admitting: Family Medicine

## 2016-09-17 ENCOUNTER — Encounter: Payer: Self-pay | Admitting: Family Medicine

## 2016-09-17 VITALS — BP 169/74 | HR 113 | Temp 98.1°F | Ht 62.0 in | Wt 206.0 lb

## 2016-09-17 DIAGNOSIS — E119 Type 2 diabetes mellitus without complications: Secondary | ICD-10-CM

## 2016-09-17 DIAGNOSIS — G5602 Carpal tunnel syndrome, left upper limb: Secondary | ICD-10-CM | POA: Diagnosis not present

## 2016-09-17 DIAGNOSIS — M150 Primary generalized (osteo)arthritis: Secondary | ICD-10-CM

## 2016-09-17 DIAGNOSIS — E7800 Pure hypercholesterolemia, unspecified: Secondary | ICD-10-CM

## 2016-09-17 DIAGNOSIS — E2839 Other primary ovarian failure: Secondary | ICD-10-CM

## 2016-09-17 DIAGNOSIS — M47816 Spondylosis without myelopathy or radiculopathy, lumbar region: Secondary | ICD-10-CM | POA: Diagnosis not present

## 2016-09-17 DIAGNOSIS — Q762 Congenital spondylolisthesis: Secondary | ICD-10-CM | POA: Diagnosis not present

## 2016-09-17 DIAGNOSIS — M47817 Spondylosis without myelopathy or radiculopathy, lumbosacral region: Secondary | ICD-10-CM | POA: Diagnosis not present

## 2016-09-17 DIAGNOSIS — M159 Polyosteoarthritis, unspecified: Secondary | ICD-10-CM

## 2016-09-17 LAB — POCT GLYCOSYLATED HEMOGLOBIN (HGB A1C): HEMOGLOBIN A1C: 8.2

## 2016-09-17 LAB — LDL CHOLESTEROL, DIRECT: LDL DIRECT: 121 mg/dL (ref <130)

## 2016-09-17 MED ORDER — METFORMIN HCL ER 500 MG PO TB24: ORAL_TABLET | ORAL | 3 refills | Status: DC

## 2016-09-17 MED ORDER — EMPAGLIFLOZIN 10 MG PO TABS: 10.0000 mg | ORAL_TABLET | Freq: Every day | ORAL | 99 refills | Status: DC

## 2016-09-17 MED ORDER — HYDROCODONE-ACETAMINOPHEN 7.5-325 MG PO TABS: 1.0000 | ORAL_TABLET | Freq: Four times a day (QID) | ORAL | 0 refills | Status: DC | PRN

## 2016-09-17 NOTE — Patient Instructions (Signed)
Continue to wear the wrist for now using cushioning for the edges. Referral made to Sports Medicine for consideration of steroid inject of wrist.  If this does not help, then will need a referral too a hand surgeon.  Your blood sugar is improving, A1c down to 8.2% from 8.7% in October. Continue your Jardiance and Metformin at current dose.

## 2016-09-18 DIAGNOSIS — E119 Type 2 diabetes mellitus without complications: Secondary | ICD-10-CM | POA: Diagnosis not present

## 2016-09-18 LAB — TSH: TSH: 1.46 m[IU]/L

## 2016-09-18 LAB — HM DIABETES EYE EXAM

## 2016-09-18 NOTE — Assessment & Plan Note (Signed)
Established problem that has improved.  Lab Results  Component Value Date   HGBA1C 8.2 09/17/2016  pt has been on Jardiance for about 4 weeks. Not quite enough time to reverse glycosylation to reflect 3 month avg A1c.  Continue metformin and Jardiance.  RTC January 18

## 2016-09-18 NOTE — Progress Notes (Signed)
   Subjective:    Patient ID: [name], female    DOB: [birth year], 65 y.o.   MRN: [id number] [name] is alone Sources of clinical information for visit is/are patient and past medical records. Nursing assessment for this office visit was reviewed with the patient for accuracy and revision.   HPI  CHRONIC DIABETES Longstanding problem Disease Monitoring  Blood Sugar Ranges: 100 to 200s  Polyuria: no   Visual problems: no   Medication Compliance: yes, metformin 1500 mg daily and Jardiance  Medication Side Effects  Hypoglycemia: yes, felt low.  Did not test.  Self-rescured easily   Carpel Tunnel Syndrome, Left wrist - Dx 07/30/16 ov with McDiarmid - Has been wearing wrist splint 24/7 with NSAIDs without significant improvement in pain and numbness in wrist and hand. - Progression in pain since last ov - No erythema in left hand/wrist - subjective feeling left hand weaker grip - paresthesia in all fingers of left hand but most in 2nd digit  SH: smoking history reviewed  Review of Systems No trauma to hands No weight gain No edema No change in bowel movements No change in skin No    Objective:   Physical Exam VS reviewed GEN: Alert, Cooperative, Groomed, NAD COR: RRR, No M/G/R, No JVD, Normal PMI size and location LUNGS: BCTA, No Acc mm use, speaking in full sentences EXT: No peripheral leg edema.  Diabetic Foot Exam - Simple   Simple Foot Form Visual Inspection No deformities, no ulcerations, no other skin breakdown bilaterally:  Yes Sensation Testing Intact to touch and monofilament testing bilaterally:  Yes Pulse Check Posterior Tibialis and Dorsalis pulse intact bilaterally:  Yes Comments     SKIN: No lesion nor rashes of face/trunk/extremities Neuro: Oriented to person, place, and time; Strength: 5/5 Bil. Except 4+ grip left hand. No thenar nor hypothenar wasting.  DTR: Bilateral Bicep 1+  Psych: Normal affect/thought/speech/language       Assessment & Plan:

## 2016-09-18 NOTE — Assessment & Plan Note (Addendum)
Established problem worsened.  Increased pain in left hand/wrist. Possible slight decrease in left hand grip strength.  Plan:  Check TSH  Continue wrist brace 24/7.  Referral to Sports Medicine clinic for consideration of carpel tunnel injection under [place]. Continue NSAIDs with Norco for breakthru pain

## 2016-09-22 ENCOUNTER — Ambulatory Visit (INDEPENDENT_AMBULATORY_CARE_PROVIDER_SITE_OTHER): Payer: Medicare Other | Admitting: Family Medicine

## 2016-09-22 ENCOUNTER — Encounter: Payer: Self-pay | Admitting: Family Medicine

## 2016-09-22 ENCOUNTER — Other Ambulatory Visit: Payer: Self-pay | Admitting: *Deleted

## 2016-09-22 DIAGNOSIS — G5602 Carpal tunnel syndrome, left upper limb: Secondary | ICD-10-CM | POA: Diagnosis not present

## 2016-09-22 MED ORDER — METHYLPREDNISOLONE ACETATE 40 MG/ML IJ SUSP
40.0000 mg | Freq: Once | INTRAMUSCULAR | Status: AC
  Administered 2016-09-22: 20 mg via INTRA_ARTICULAR

## 2016-09-22 NOTE — Progress Notes (Signed)
  [name] - 65 y.o. female MRN [id number]  Date of birth: [birth year]  SUBJECTIVE:  Including CC & ROS.   [name] is a 65 year old female that is presenting with left wrist pain. She reports that she has been diagnosed with carpal tunnel syndrome. She's had these symptoms for at least 3 years. It appears that they have gotten worse where her symptoms are constant in nature. She has numbness and tingling in the first 3 fingers of her left hand. She has had no prior EMG or injection. She reports that is localized with no radiation proximally. She has tried an cock-up splint with little improvement. She has had some cramping in her fingers as well. She does feel these symptoms when she is working on a computer or when the weather changes.  ROS: No unexpected weight loss, fever, chills, swelling, instability, muscle pain, redness, otherwise see HPI    HISTORY: Past Medical, Surgical, Social, and Family History Reviewed & Updated per EMR.   Pertinent Historical Findings include: PMSHx -  DM2, cervical spine fusion. Left and Right TKA, breast biopsyHTN, obesity     PSHx -  No alcohol or tobacco use  FHx -  CTS, OA  Medications - hydrococone   DATA REVIEWED: 05/21/10: left wrist x-ray: Vague minimal lucency at the waist of the scaphoid, seen only on frontal images; no additional evidence to suggest fracture.  PHYSICAL EXAM:  VS: BP:(!) 172/76  HR:(!) 110bpm  TEMP: ( )  RESP:   HT:5\' 2"  (157.5 cm)   WT:206 lb (93.4 kg)  BMI:37.8 PHYSICAL EXAM: Gen: NAD, alert, cooperative with exam, well-appearing HEENT: clear conjunctiva, EOMI CV:  no edema, capillary refill brisk,  Resp: non-labored, normal speech Skin: no rashes, normal turgor  Neuro: no gross deficits.  Psych:  alert and oriented Left wrist:  No overlying effusion  No snuffbox tenderness  Tender to palpation over the carpal tunnel Normal wrist range of motion. Some pain with resisted wrist flexion  Normal finger abduction and  abduction. Pincer grasp weaker on the left compared to the right. No significant atrophy of the thenar hyperthenar eminence. Positive Tinel sign. Positive Phalen's test   Aspiration/Injection Procedure Note DAJANE [name] Apr 06, 1951  Procedure: Injection Indications: Carpal Tunnel syndrome   Procedure Details Consent: Risks of procedure as well as the alternatives and risks of each were explained to the (patient/caregiver).  Consent for procedure obtained. Time Out: Verified patient identification, verified procedure, site/side was marked, verified correct patient position, special equipment/implants available, medications/allergies/relevent history reviewed, required imaging and test results available.  Performed.  The area was cleaned with iodine and alcohol swabs.    A wheal of 1% lidocaine without epinephrine was injected to anesthetize the skin. The left carpal tunnel was injected parallel with the median nerve using 0.5 cc's of 40 mg Depomedrol and 1 cc's of 1% lidocaine with a 25 1 1/2" needle.  Ultrasound was used. Images weren't obtained.  A sterile dressing was applied.  Patient did tolerate procedure well.   ASSESSMENT & PLAN:   Carpal tunnel syndrome Acute on chronic in nature.  - She received an injection today - She will follow-up in 2 weeks. - May need to pursue an EMG due to the duration and worsening of her symptoms. May need to pursue surgery as well. - May benefit from a x-ray of her left wrist as she might have some underlying OA of the wrist.

## 2016-09-22 NOTE — Assessment & Plan Note (Addendum)
Acute on chronic in nature.  - She received an injection today - She will follow-up in 2 weeks. - May need to pursue an EMG due to the duration and worsening of her symptoms. May need to pursue surgery as well. - May benefit from a x-ray of her left wrist as she might have some underlying OA of the wrist.

## 2016-09-29 ENCOUNTER — Encounter: Payer: Self-pay | Admitting: *Deleted

## 2016-09-29 ENCOUNTER — Ambulatory Visit (INDEPENDENT_AMBULATORY_CARE_PROVIDER_SITE_OTHER): Payer: Medicare Other | Admitting: *Deleted

## 2016-09-29 VITALS — BP 130/60 | HR 96 | Temp 98.0°F | Ht 62.0 in | Wt 204.8 lb

## 2016-09-29 DIAGNOSIS — Z0000 Encounter for general adult medical examination without abnormal findings: Secondary | ICD-10-CM

## 2016-09-29 NOTE — Patient Instructions (Addendum)
Fat and Cholesterol Restricted Diet High levels of fat and cholesterol in your blood may lead to various health problems, such as diseases of the heart, blood vessels, gallbladder, liver, and pancreas. Fats are concentrated sources of energy that come in various forms. Certain types of fat, including saturated fat, may be harmful in excess. Cholesterol is a substance needed by your body in small amounts. Your body makes all the cholesterol it needs. Excess cholesterol comes from the food you eat. When you have high levels of cholesterol and saturated fat in your blood, health problems can develop because the excess fat and cholesterol will gather along the walls of your blood vessels, causing them to narrow. Choosing the right foods will help you control your intake of fat and cholesterol. This will help keep the levels of these substances in your blood within normal limits and reduce your risk of disease. What is my plan? Your health care provider recommends that you:  Limit your fat intake to ______% or less of your total calories per day.  Limit the amount of cholesterol in your diet to less than _________mg per day.  Eat 20-30 grams of fiber each day. What types of fat should I choose?  Choose healthy fats more often. Choose monounsaturated and polyunsaturated fats, such as olive and canola oil, flaxseeds, walnuts, almonds, and seeds.  Eat more omega-3 fats. Good choices include salmon, mackerel, sardines, tuna, flaxseed oil, and ground flaxseeds. Aim to eat fish at least two times a week.  Limit saturated fats. Saturated fats are primarily found in animal products, such as meats, butter, and cream. Plant sources of saturated fats include palm oil, palm kernel oil, and coconut oil.  Avoid foods with partially hydrogenated oils in them. These contain trans fats. Examples of foods that contain trans fats are stick margarine, some tub margarines, cookies, crackers, and other baked goods. What  general guidelines do I need to follow? These guidelines for healthy eating will help you control your intake of fat and cholesterol:  Check food labels carefully to identify foods with trans fats or high amounts of saturated fat.  Fill one half of your plate with vegetables and green salads.  Fill one fourth of your plate with whole grains. Look for the word "whole" as the first word in the ingredient list.  Fill one fourth of your plate with lean protein foods.  Limit fruit to two servings a day. Choose fruit instead of juice.  Eat more foods that contain fiber, such as apples, broccoli, carrots, beans, peas, and barley.  Eat more home-cooked food and less restaurant, buffet, and fast food.  Limit or avoid alcohol.  Limit foods high in starch and sugar.  Limit fried foods.  Cook foods using methods other than frying. Baking, boiling, grilling, and broiling are all great options.  Lose weight if you are overweight. Losing just 5-10% of your initial body weight can help your overall health and prevent diseases such as diabetes and heart disease. What foods can I eat? Grains  Whole grains, such as whole wheat or whole grain breads, crackers, cereals, and pasta. Unsweetened oatmeal, bulgur, barley, quinoa, or brown rice. Corn or whole wheat flour tortillas. Vegetables  Fresh or frozen vegetables (raw, steamed, roasted, or grilled). Green salads. Fruits  All fresh, canned (in natural juice), or frozen fruits. Meats and other protein foods  Ground beef (85% or leaner), grass-fed beef, or beef trimmed of fat. Skinless chicken or [place]. Ground chicken or [place]. Pork trimmed  of fat. All fish and seafood. Eggs. Dried beans, peas, or lentils. Unsalted nuts or seeds. Unsalted canned or dry beans. Dairy  Low-fat dairy products, such as skim or 1% milk, 2% or reduced-fat cheeses, low-fat ricotta or cottage cheese, or plain low-fat yo Fats and oils  Tub margarines without trans fats.  Light or reduced-fat mayonnaise and salad dressings. Avocado. Olive, canola, sesame, or safflower oils. Natural peanut or almond butter (choose ones without added sugar and oil). The items listed above may not be a complete list of recommended foods or beverages. Contact your dietitian for more options.  Foods to avoid Grains  White bread. White pasta. White rice. Cornbread. Bagels, pastries, and croissants. Crackers that contain trans fat. Vegetables  White potatoes. Corn. Creamed or fried vegetables. Vegetables in a cheese sauce. Fruits  Dried fruits. Canned fruit in light or heavy syrup. Fruit juice. Meats and other protein foods  Fatty cuts of meat. Ribs, chicken wings, bacon, sausage, bologna, salami, chitterlings, fatback, hot dogs, bratwurst, and packaged luncheon meats. Liver and organ meats. Dairy  Whole or 2% milk, cream, half-and-half, and cream cheese. Whole milk cheeses. Whole-fat or sweetened yogurt. Full-fat cheeses. Nondairy creamers and whipped toppings. Processed cheese, cheese spreads, or cheese curds. Beverages  Alcohol. Sweetened drinks (such as sodas, lemonade, and fruit drinks or punches). Fats and oils  Butter, stick margarine, lard, shortening, ghee, or bacon fat. Coconut, palm kernel, or palm oils. Sweets and desserts  Corn syrup, sugars, honey, and molasses. Candy. Jam and jelly. Syrup. Sweetened cereals. Cookies, pies, cakes, donuts, muffins, and ice cream. The items listed above may not be a complete list of foods and beverages to avoid. Contact your dietitian for more information.  This information is not intended to replace advice given to you by your health care provider. Make sure you discuss any questions you have with your health care provider. Document Released: 10/05/2005 Document Revised: 10/26/2014 Document Reviewed: 01/03/2014 Elsevier Interactive Patient Education  2017 [place].   Diabetes and Foot Care Diabetes may cause you to have  problems because of poor blood supply (circulation) to your feet and legs. This may cause the skin on your feet to become thinner, break easier, and heal more slowly. Your skin may become dry, and the skin may peel and crack. You may also have nerve damage in your legs and feet causing decreased feeling in them. You may not notice minor injuries to your feet that could lead to infections or more serious problems. Taking care of your feet is one of the most important things you can do for yourself. Follow these instructions at home:  Wear shoes at all times, even in the house. Do not go barefoot. Bare feet are easily injured.  Check your feet daily for blisters, cuts, and redness. If you cannot see the bottom of your feet, use a mirror or ask someone for help.  Wash your feet with warm water (do not use hot water) and mild soap. Then pat your feet and the areas between your toes until they are completely dry. Do not soak your feet as this can dry your skin.  Apply a moisturizing lotion or petroleum jelly (that does not contain alcohol and is unscented) to the skin on your feet and to dry, brittle toenails. Do not apply lotion between your toes.  Trim your toenails straight across. Do not dig under them or around the cuticle. File the edges of your nails with an emery board or nail file.  Do not cut corns or calluses or try to remove them with medicine.  Wear clean socks or stockings every day. Make sure they are not too tight. Do not wear knee-high stockings since they may decrease blood flow to your legs.  Wear shoes that fit properly and have enough cushioning. To break in new shoes, wear them for just a few hours a day. This prevents you from injuring your feet. Always look in your shoes before you put them on to be sure there are no objects inside.  Do not cross your legs. This may decrease the blood flow to your feet.  If you find a minor scrape, cut, or break in the skin on your feet, keep  it and the skin around it clean and dry. These areas may be cleansed with mild soap and water. Do not cleanse the area with peroxide, alcohol, or iodine.  When you remove an adhesive bandage, be sure not to damage the skin around it.  If you have a wound, look at it several times a day to make sure it is healing.  Do not use heating pads or hot water bottles. They may burn your skin. If you have lost feeling in your feet or legs, you may not know it is happening until it is too late.  Make sure your health care provider performs a complete foot exam at least annually or more often if you have foot problems. Report any cuts, sores, or bruises to your health care provider immediately. Contact a health care provider if:  You have an injury that is not healing.  You have cuts or breaks in the skin.  You have an ingrown nail.  You notice redness on your legs or feet.  You feel burning or tingling in your legs or feet.  You have pain or cramps in your legs and feet.  Your legs or feet are numb.  Your feet always feel cold. Get help right away if:  There is increasing redness, swelling, or pain in or around a wound.  There is a red line that goes up your leg.  Pus is coming from a wound.  You develop a fever or as directed by your health care provider.  You notice a bad smell coming from an ulcer or wound. This information is not intended to replace advice given to you by your health care provider. Make sure you discuss any questions you have with your health care provider. Document Released: 10/02/2000 Document Revised: 03/12/2016 Document Reviewed: 03/14/2013 Elsevier Interactive Patient Education  2017 [place] Prevention in the Home Introduction Falls can cause injuries. They can happen to people of all ages. There are many things you can do to make your home safe and to help prevent falls. What can I do on the outside of my home?  Regularly fix the edges of  walkways and driveways and fix any cracks.  Remove anything that might make you trip as you walk through a door, such as a raised step or threshold.  Trim any bushes or trees on the path to your home.  Use bright outdoor lighting.  Clear any walking paths of anything that might make someone trip, such as rocks or tools.  Regularly check to see if handrails are loose or broken. Make sure that both sides of any steps have handrails.  Any raised decks and porches should have guardrails on the edges.  Have any leaves, snow, or ice cleared regularly.  Use sand or salt on walking paths during winter.  Clean up any spills in your garage right away. This includes oil or grease spills. What can I do in the bathroom?  Use night lights.  Install grab bars by the toilet and in the tub and shower. Do not use towel bars as grab bars.  Use non-skid mats or decals in the tub or shower.  If you need to sit down in the shower, use a plastic, non-slip stool.  Keep the floor dry. Clean up any water that spills on the floor as soon as it happens.  Remove soap buildup in the tub or shower regularly.  Attach bath mats securely with double-sided non-slip rug tape.  Do not have throw rugs and other things on the floor that can make you trip. What can I do in the bedroom?  Use night lights.  Make sure that you have a light by your bed that is easy to reach.  Do not use any sheets or blankets that are too big for your bed. They should not hang down onto the floor.  Have a firm chair that has side arms. You can use this for support while you get dressed.  Do not have throw rugs and other things on the floor that can make you trip. What can I do in the kitchen?  Clean up any spills right away.  Avoid walking on wet floors.  Keep items that you use a lot in easy-to-reach places.  If you need to reach something above you, use a strong step stool that has a grab bar.  Keep electrical cords  out of the way.  Do not use floor polish or wax that makes floors slippery. If you must use wax, use non-skid floor wax.  Do not have throw rugs and other things on the floor that can make you trip. What can I do with my stairs?  Do not leave any items on the stairs.  Make sure that there are handrails on both sides of the stairs and use them. Fix handrails that are broken or loose. Make sure that handrails are as long as the stairways.  Check any carpeting to make sure that it is firmly attached to the stairs. Fix any carpet that is loose or worn.  Avoid having throw rugs at the top or bottom of the stairs. If you do have throw rugs, attach them to the floor with carpet tape.  Make sure that you have a light switch at the top of the stairs and the bottom of the stairs. If you do not have them, ask someone to add them for you. What else can I do to help prevent falls?  Wear shoes that:  Do not have high heels.  Have rubber bottoms.  Are comfortable and fit you well.  Are closed at the toe. Do not wear sandals.  If you use a stepladder:  Make sure that it is fully opened. Do not climb a closed stepladder.  Make sure that both sides of the stepladder are locked into place.  Ask someone to hold it for you, if possible.  Clearly mark and make sure that you can see:  Any grab bars or handrails.  First and last steps.  Where the edge of each step is.  Use tools that help you move around (mobility aids) if they are needed. These include:  Canes.  Walkers.  Scooters.  Crutches.  Turn on the lights when you go into  a dark area. Replace any light bulbs as soon as they burn out.  Set up your furniture so you have a clear path. Avoid moving your furniture around.  If any of your floors are uneven, fix them.  If there are any pets around you, be aware of where they are.  Review your medicines with your doctor. Some medicines can make you feel dizzy. This can increase  your chance of falling. Ask your doctor what other things that you can do to help prevent falls. This information is not intended to replace advice given to you by your health care provider. Make sure you discuss any questions you have with your health care provider. Document Released: 08/01/2009 Document Revised: 03/12/2016 Document Reviewed: 11/09/2014  2017 Elsevier  Health Maintenance, Female Introduction Adopting a healthy lifestyle and getting preventive care can go a long way to promote health and wellness. Talk with your health care provider about what schedule of regular examinations is right for you. This is a good chance for you to check in with your provider about disease prevention and staying healthy. In between checkups, there are plenty of things you can do on your own. Experts have done a lot of research about which lifestyle changes and preventive measures are most likely to keep you healthy. Ask your health care provider for more information. Weight and diet Eat a healthy diet  Be sure to include plenty of vegetables, fruits, low-fat dairy products, and lean protein.  Do not eat a lot of foods high in solid fats, added sugars, or salt.  Get regular exercise. This is one of the most important things you can do for your health.  Most adults should exercise for at least 150 minutes each week. The exercise should increase your heart rate and make you sweat (moderate-intensity exercise).  Most adults should also do strengthening exercises at least twice a week. This is in addition to the moderate-intensity exercise. Maintain a healthy weight  Body mass index (BMI) is a measurement that can be used to identify possible weight problems. It estimates body fat based on height and weight. Your health care provider can help determine your BMI and help you achieve or maintain a healthy weight.  For females 47 years of age and older:  A BMI below 18.5 is considered  underweight.  A BMI of 18.5 to 24.9 is normal.  A BMI of 25 to 29.9 is considered overweight.  A BMI of 30 and above is considered obese. Watch levels of cholesterol and blood lipids  You should start having your blood tested for lipids and cholesterol at 66 years of age, then have this test every 5 years.  You may need to have your cholesterol levels checked more often if:  Your lipid or cholesterol levels are high.  You are older than 66 years of age.  You are at high risk for heart disease. Cancer screening Lung Cancer  Lung cancer screening is recommended for adults 19-8 years old who are at high risk for lung cancer because of a history of smoking.  A yearly low-dose CT scan of the lungs is recommended for people who:  Currently smoke.  Have quit within the past 15 years.  Have at least a 30-pack-year history of smoking. A pack year is smoking an average of one pack of cigarettes a day for 1 year.  Yearly screening should continue until it has been 15 years since you quit.  Yearly screening should stop if you develop  a health problem that would prevent you from having lung cancer treatment. Breast Cancer  Practice breast self-awareness. This means understanding how your breasts normally appear and feel.  It also means doing regular breast self-exams. Let your health care provider know about any changes, no matter how small.  If you are in your 20s or 30s, you should have a clinical breast exam (CBE) by a health care provider every 1-3 years as part of a regular health exam.  If you are 57 or older, have a CBE every year. Also consider having a breast X-ray (mammogram) every year.  If you have a family history of breast cancer, talk to your health care provider about genetic screening.  If you are at high risk for breast cancer, talk to your health care provider about having an MRI and a mammogram every year.  Breast cancer gene (BRCA) assessment is recommended  for women who have family members with BRCA-related cancers. BRCA-related cancers include:  Breast.  Ovarian.  Tubal.  Peritoneal cancers.  Results of the assessment will determine the need for genetic counseling and BRCA1 and BRCA2 testing. Cervical Cancer  Your health care provider may recommend that you be screened regularly for cancer of the pelvic organs (ovaries, uterus, and vagina). This screening involves a pelvic examination, including checking for microscopic changes to the surface of your cervix (Pap test). You may be encouraged to have this screening done every 3 years, beginning at age 61.  For women ages 64-65, health care providers may recommend pelvic exams and Pap testing every 3 years, or they may recommend the Pap and pelvic exam, combined with testing for human papilloma virus (HPV), every 5 years. Some types of HPV increase your risk of cervical cancer. Testing for HPV may also be done on women of any age with unclear Pap test results.  Other health care providers may not recommend any screening for nonpregnant women who are considered low risk for pelvic cancer and who do not have symptoms. Ask your health care provider if a screening pelvic exam is right for you.  If you have had past treatment for cervical cancer or a condition that could lead to cancer, you need Pap tests and screening for cancer for at least 20 years after your treatment. If Pap tests have been discontinued, your risk factors (such as having a new sexual partner) need to be reassessed to determine if screening should resume. Some women have medical problems that increase the chance of getting cervical cancer. In these cases, your health care provider may recommend more frequent screening and Pap tests. Colorectal Cancer  This type of cancer can be detected and often prevented.  Routine colorectal cancer screening usually begins at 66 years of age and continues through 66 years of age.  Your health  care provider may recommend screening at an earlier age if you have risk factors for colon cancer.  Your health care provider may also recommend using home test kits to check for hidden blood in the stool.  A small camera at the end of a tube can be used to examine your colon directly (sigmoidoscopy or colonoscopy). This is done to check for the earliest forms of colorectal cancer.  Routine screening usually begins at age 38.  Direct examination of the colon should be repeated every 5-10 years through 66 years of age. However, you may need to be screened more often if early forms of precancerous polyps or small growths are found. Skin Cancer  Check your skin from head to toe regularly.  Tell your health care provider about any new moles or changes in moles, especially if there is a change in a mole's shape or color.  Also tell your health care provider if you have a mole that is larger than the size of a pencil eraser.  Always use sunscreen. Apply sunscreen liberally and repeatedly throughout the day.  Protect yourself by wearing long sleeves, pants, a wide-brimmed hat, and sunglasses whenever you are outside. Heart disease, diabetes, and high blood pressure  High blood pressure causes heart disease and increases the risk of stroke. High blood pressure is more likely to develop in:  People who have blood pressure in the high end of the normal range (130-139/85-89 mm Hg).  People who are overweight or obese.  People who are African American.  If you are 63-80 years of age, have your blood pressure checked every 3-5 years. If you are 83 years of age or older, have your blood pressure checked every year. You should have your blood pressure measured twice-once when you are at a hospital or clinic, and once when you are not at a hospital or clinic. Record the average of the two measurements. To check your blood pressure when you are not at a hospital or clinic, you can use:  An automated  blood pressure machine at a pharmacy.  A home blood pressure monitor.  If you are between 54 years and 81 years old, ask your health care provider if you should take aspirin to prevent strokes.  Have regular diabetes screenings. This involves taking a blood sample to check your fasting blood sugar level.  If you are at a normal weight and have a low risk for diabetes, have this test once every three years after 66 years of age.  If you are overweight and have a high risk for diabetes, consider being tested at a younger age or more often. Preventing infection Hepatitis B  If you have a higher risk for hepatitis B, you should be screened for this virus. You are considered at high risk for hepatitis B if:  You were born in a country where hepatitis B is common. Ask your health care provider which countries are considered high risk.  Your parents were born in a high-risk country, and you have not been immunized against hepatitis B (hepatitis B vaccine).  You have HIV or AIDS.  You use needles to inject street drugs.  You live with someone who has hepatitis B.  You have had sex with someone who has hepatitis B.  You get hemodialysis treatment.  You take certain medicines for conditions, including cancer, organ transplantation, and autoimmune conditions. Hepatitis C  Blood testing is recommended for:  Everyone born from [birth year] through 1966.  Anyone with known risk factors for hepatitis C. Sexually transmitted infections (STIs)  You should be screened for sexually transmitted infections (STIs) including gonorrhea and chlamydia if:  You are sexually active and are younger than 66 years of age.  You are older than 66 years of age and your health care provider tells you that you are at risk for this type of infection.  Your sexual activity has changed since you were last screened and you are at an increased risk for chlamydia or gonorrhea. Ask your health care provider if you are at  risk.  If you do not have HIV, but are at risk, it may be recommended that you take a prescription medicine daily to  prevent HIV infection. This is called pre-exposure prophylaxis (PrEP). You are considered at risk if:  You are sexually active and do not regularly use condoms or know the HIV status of your partner(s).  You take drugs by injection.  You are sexually active with a partner who has HIV. Talk with your health care provider about whether you are at high risk of being infected with HIV. If you choose to begin PrEP, you should first be tested for HIV. You should then be tested every 3 months for as long as you are taking PrEP. Pregnancy  If you are premenopausal and you may become pregnant, ask your health care provider about preconception counseling.  If you may become pregnant, take 400 to 800 micrograms (mcg) of folic acid every day.  If you want to prevent pregnancy, talk to your health care provider about birth control (contraception). Osteoporosis and menopause  Osteoporosis is a disease in which the bones lose minerals and strength with aging. This can result in serious bone fractures. Your risk for osteoporosis can be identified using a bone density scan.  If you are 44 years of age or older, or if you are at risk for osteoporosis and fractures, ask your health care provider if you should be screened.  Ask your health care provider whether you should take a calcium or vitamin D supplement to lower your risk for osteoporosis.  Menopause may have certain physical symptoms and risks.  Hormone replacement therapy may reduce some of these symptoms and risks. Talk to your health care provider about whether hormone replacement therapy is right for you. Follow these instructions at home:  Schedule regular health, dental, and eye exams.  Stay current with your immunizations.  Do not use any tobacco products including cigarettes, chewing tobacco, or electronic  cigarettes.  If you are pregnant, do not drink alcohol.  If you are breastfeeding, limit how much and how often you drink alcohol.  Limit alcohol intake to no more than 1 drink per day for nonpregnant women. One drink equals 12 ounces of beer, 5 ounces of wine, or 1 ounces of hard liquor.  Do not use street drugs.  Do not share needles.  Ask your health care provider for help if you need support or information about quitting drugs.  Tell your health care provider if you often feel depressed.  Tell your health care provider if you have ever been abused or do not feel safe at home. This information is not intended to replace advice given to you by your health care provider. Make sure you discuss any questions you have with your health care provider. Document Released: 04/20/2011 Document Revised: 03/12/2016 Document Reviewed: 07/09/2015  2017 Elsevier

## 2016-09-29 NOTE — Progress Notes (Signed)
Subjective:   [name] is a 65 y.o. female who presents for Medicare Annual (Subsequent) preventive examination.  Cardiac Risk Factors include: advanced age (>20men, >75 women);diabetes mellitus;dyslipidemia;hypertension;smoking/ tobacco exposure;obesity (BMI >30kg/m2)     Objective:     Vitals: BP 130/60 (BP Location: Left Arm, Patient Position: Sitting, Cuff Size: Large)   Pulse 96   Temp 98 F (36.7 C) (Oral)   Ht 5\' 2"  (1.575 m)   Wt 204 lb 12.8 oz (92.9 kg)   SpO2 97%   BMI 37.46 kg/m   Body mass index is 37.46 kg/m.   Tobacco History  Smoking Status  . Former Smoker  . Packs/day: 1.00  . Years: 4.00  . Types: Cigarettes  . Quit date: 10/20/1979  Smokeless Tobacco  . Never Used     Counseling given: Yes Patient is former smoker with no plans to restart   Past Medical History:  Diagnosis Date  . Acute MEE (middle ear effusion) 06/07/2014  . Acute sinusitis 12/21/2014  . Anemia    hx  . ATTENTION DEFICIT, W/O HYPERACTIVITY 12/16/2006  . Cervical spondylosis with myelopathy, History of 04/28/2012  . DIABETES MELLITUS II, UNCOMPLICATED 99991111  . DIFFUSE IDIOPATHIC SKELETAL HYPEROSTOSIS 03/29/2008  . GERD (gastroesophageal reflux disease)   . H/O abnormal mammogram 07/19/2009   S/P Breast Biopsy ([place], 07/2009): Benign findings.    . 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  . HEMORRHOIDS, NOS 12/16/2006   Qualifier: History of  By: McDiarmid MD, Sherren Mocha    . History of blood transfusion 10/2004   with knee replacement  . History of peptic ulcer 12/16/2006   UGI series PUD - 10/19/1998    . History of right greater trochanteric bursitis 11/07/2009  . HYPERCHOLESTEROLEMIA 02/28/2007  . HYPERTENSION, BENIGN SYSTEMIC 12/16/2006  . INCONTINENCE, URGE 12/16/2006   Qualifier: History of  By: McDiarmid MD, Sherren Mocha    . OSTEOARTHRITIS, MULTI SITES 12/16/2006  . Perennial allergic rhinitis with seasonal variation 12/16/2006       . PONV (postoperative  nausea and vomiting)    last surgery only  . SACROILIITIS, HISTORY OF 01/08/2009   Qualifier: History of  By: McDiarmid MD, Sherren Mocha    . Seasonal allergies   . Ulcer [facility])    Past Surgical History:  Procedure Laterality Date  . ANTERIOR CERVICAL DECOMP/DISCECTOMY FUSION  04/28/2012   Procedure: ANTERIOR CERVICAL DECOMPRESSION/DISCECTOMY FUSION 3 LEVELS;  Surgeon: Ophelia Charter, MD;  Location: [place] [facility];  Service: Neurosurgery;  Laterality: N/A;  Cervical three-four,Cervical four-five,Cervical five-six anterior cervical decompression with fusion interbody prothesis plating and bonegraft  . BREAST BIOPSY  07/2009   S/P Breast Biopsy [facility] [place], 07/2009): Benign findings.  (10/27/2010)  . COLONOSCOPY W/ POLYPECTOMY  12/2000   colonoscopy (Dr Collene Mares) int. hemorrhoids &  nonneoplatic colon polyps - 01/10/2001,   . COLONOSCOPY W/ POLYPECTOMY  01/2012   Dr Hilarie Fredrickson (GI).sigmoid colon, hyperlastic polyp  . ENDOMETRIAL BIOPSY  08/2004   Endometrial Biopsy - 09/01/2004,  . NM MYOVIEW LTD  04/2004   Cardiolite:EF63%, no ischemia - 05/01/2004,   . REPLACEMENT TOTAL KNEE BILATERAL     Family History  Problem Relation Age of Onset  . Hypertension Mother   . Arthritis Mother   . Cancer Sister 58    Breast  . Cancer Sister 25    Breast  . Arthritis Father   . Diabetes Father   . Heart attack Sister   . Drug abuse  Sister   . Colon cancer Neg Hx   . Esophageal cancer Neg Hx   . Rectal cancer Neg Hx   . Stomach cancer Neg Hx    History  Sexual Activity  . Sexual activity: Yes    Outpatient Encounter Prescriptions as of 09/29/2016  Medication Sig  . albuterol (PROVENTIL HFA;VENTOLIN HFA) 108 (90 BASE) MCG/ACT inhaler Inhale 2 puffs into the lungs every 6 (six) hours as needed for wheezing or shortness of breath.  Marland Kitchen amLODipine-benazepril (LOTREL) 10-20 MG capsule TAKE 1 CAPSULE DAILY  . BIOTIN PO Take by mouth.  . docusate sodium (RA COL-RITE) 100 MG capsule Take 1  capsule (100 mg total) by mouth 2 (two) times daily.  . empagliflozin (JARDIANCE) 10 MG TABS tablet Take 10 mg by mouth daily.  . fluticasone (FLONASE) 50 MCG/ACT nasal spray USE 2 SPRAYS IN EACH NOSTRIL DAILY  . glucose blood (ACCU-CHEK SMARTVIEW) test strip 1 each by Other route 2 (two) times daily. Use as instructed  . HYDROcodone-acetaminophen (NORCO) 7.5-325 MG tablet Take 1 tablet by mouth every 6 (six) hours as needed for moderate pain.  . metFORMIN (GLUCOPHAGE-XR) 500 MG 24 hr tablet 1 tablet by mouth in the morning and two tablets in the evening  . methocarbamol (ROBAXIN) 750 MG tablet TAKE 1 TABLET EVERY 6 HOURS AS NEEDED FOR MUSCLE SPASMS  . naproxen (NAPROSYN) 500 MG tablet TAKE 1 TABLET TWICE A DAY WITH MEALS  . omeprazole (PRILOSEC) 20 MG capsule TAKE 1 CAPSULE DAILY AS NEEDED FOR ACID REFLUX  . triamcinolone cream (KENALOG) 0.1 % Apply 1 application topically 2 (two) times daily.  . vitamin B-12 (CYANOCOBALAMIN) 1000 MCG tablet Take 1 tablet (1,000 mcg total) by mouth daily.  . Coenzyme Q10 (COQ10 PO) Take by mouth.  . methylphenidate (RITALIN) 10 MG tablet Take 2 tablets (20 mg total) by mouth 3 (three) times daily.  . pregabalin (LYRICA) 50 MG capsule Take 1 capsule (50 mg total) by mouth 3 (three) times daily. (Patient not taking: Reported on 09/29/2016)   No facility-administered encounter medications on file as of 09/29/2016.     Activities of Daily Living In your present state of health, do you have any difficulty performing the following activities: 09/29/2016  Hearing? N  Vision? N  Difficulty concentrating or making decisions? N  Walking or climbing stairs? N  Dressing or bathing? N  Doing errands, shopping? N  Preparing Food and eating ? N  Using the Toilet? N  In the past six months, have you accidently leaked urine? N  Do you have problems with loss of bowel control? N  Managing your Medications? N  Managing your Finances? N  Housekeeping or managing your  Housekeeping? N  Some recent data might be hidden   Home Safety:  My home has a working smoke alarm:  Yes, in every room           My home throw rugs have been fastened down to the floor or removed:  Removed I have non-slip mats in the bathtub and shower:  Tub has non-slip surface. Also has shower chair and grab bars         All my home's stairs have railings or bannisters: Two level home with railings inside and out           My home's floors, stairs and hallways are free from clutter, wires and cords:  Yes     I wear seatbelts consistently:  Yes   Patient Care Team: Sherren Mocha  D McDiarmid, MD as PCP - General Marygrace Drought, MD as Consulting Physician (Ophthalmology) Newman Pies, MD as Consulting Physician (Neurosurgery) Birder Robson, MD as Consulting Physician (Ophthalmology)  [facility] in [place], [place] Assessment:     Exercise Activities and Dietary recommendations Current Exercise Habits: Home exercise routine, Time (Minutes): 20, Frequency (Times/Week): 3, Weekly Exercise (Minutes/Week): 60, Intensity: Moderate Discussed increasing to 150 minutes per week  Goals    . Blood Pressure < 150/90    . HEMOGLOBIN A1C < 7.5    . Increase physical activity    . LDL CALC < 100      Fall Risk Fall Risk  09/29/2016 09/22/2016 09/17/2016 07/30/2016 01/15/2016  Falls in the past year? No No No No No   Depression Screen PHQ 2/9 Scores 09/29/2016 09/22/2016 09/17/2016 07/30/2016  PHQ - 2 Score 0 0 0 0    TUG Test:  Done in 8 seconds.   Cognitive Function: Mini-Cog  Passed with score 4/5   Immunization History  Administered Date(s) Administered  . Influenza Split 08/08/2012  . Influenza Whole 07/19/2006, 09/14/2007, 07/27/2008, 08/12/2010  . Influenza,inj,Quad PF,36+ Mos 07/31/2013, 08/10/2014, 08/13/2015, 07/30/2016  . Pneumococcal Conjugate-13 01/24/2015  . Pneumococcal Polysaccharide-23 10/19/1994, 04/29/2012  . Td 01/17/2002   Screening Tests Health  Maintenance  Topic Date Due  . MAMMOGRAM  08/29/2012  . DEXA SCAN  11/06/2014  . OPHTHALMOLOGY EXAM  10/27/2015  . FOOT EXAM  08/28/2016  . LIPID PANEL  03/17/2017  . HEMOGLOBIN A1C  03/17/2017  . PNA vac Low Risk Adult (2 of 2 - PPSV23) 04/29/2017  . COLONOSCOPY  02/02/2022  . TETANUS/TDAP  03/03/2022  . INFLUENZA VACCINE  Completed  . ZOSTAVAX  Completed  . Hepatitis C Screening  Completed   Plan:   Patient has mammogram scheduled for 10/15/2016 at [facility] [place] Had eye exam at [facility] on 09/18/2016 States had foot exam done by PCP on 09/17/2016 Will schedule Bone Density at [facility] [place] or [facility]; contact info given for [place] States she had TDaP prior to surgery in 2013 Discussed revaccinating with Shingrix when comes on market   During the course of the visit the patient was educated and counseled about the following appropriate screening and preventive services:   Vaccines to include Pneumoccal, Influenza, Td, Zostavax  Cardiovascular Disease  Colorectal cancer screening  Bone density screening  Diabetes screening  Glaucoma screening  Mammography/PAP  Nutrition counseling   Patient Instructions (the written plan) was given to the patient.   Velora Heckler, RN  09/29/2016

## 2016-10-06 ENCOUNTER — Ambulatory Visit: Payer: Medicare Other | Admitting: Family Medicine

## 2016-10-20 ENCOUNTER — Encounter: Payer: Self-pay | Admitting: Family Medicine

## 2016-10-20 ENCOUNTER — Ambulatory Visit (INDEPENDENT_AMBULATORY_CARE_PROVIDER_SITE_OTHER): Payer: Medicare Other | Admitting: Family Medicine

## 2016-10-20 DIAGNOSIS — G5602 Carpal tunnel syndrome, left upper limb: Secondary | ICD-10-CM

## 2016-10-20 NOTE — Assessment & Plan Note (Signed)
She has failed several conservative measures. Will most likely get an EMG to determine the level of severity. Can consider surgery as well.

## 2016-10-20 NOTE — Progress Notes (Signed)
  [name] - 66 y.o. female MRN [id number]  Date of birth: [birth year]  SUBJECTIVE:  Including CC & ROS.   [name] is a 66 year old female that is following up for carpal tunnel syndrome. She reports that she had improvement of her pain for one day with the injection. The pain and symptoms are no worse than previously. She continues to have numbness and tingling within her thumb and first and second digit.  ROS: No unexpected weight loss, fever, chills, swelling, instability, muscle pain, redness, otherwise see HPI    HISTORY: Past Medical, Surgical, Social, and Family History Reviewed & Updated per EMR.   Pertinent Historical Findings include: PMSHx -  DM2, cervical spine fusion. Left and Right TKA, breast biopsyHTN, obesity   DATA REVIEWED: 05/21/10: left wrist x-ray: Vague minimal lucency at the waist of the scaphoid, seen only on frontal images; no additional evidence to suggest fracture.  PHYSICAL EXAM:  VS: BP:(!) 160/78  HR: bpm  TEMP: ( )  RESP:   HT:5\' 2"  (157.5 cm)   WT:206 lb (93.4 kg)  BMI:37.8 PHYSICAL EXAM: Gen: NAD, alert, cooperative with exam, well-appearing HEENT: clear conjunctiva, EOMI CV:  no edema, capillary refill brisk,  Resp: non-labored, normal speech Skin: no rashes, normal turgor  Neuro: no gross deficits.  Psych:  alert and oriented Left wrist: No overlying effusion.  Some tenderness to palpation of the carpal tunnel. Normal wrist range of motion. Positive Tinel sign. Positive Phalen's test. Normal capillary refill.   ASSESSMENT & PLAN:   Carpal tunnel syndrome She has failed several conservative measures. Will most likely get an EMG to determine the level of severity. Can consider surgery as well.

## 2016-11-03 ENCOUNTER — Other Ambulatory Visit: Payer: Self-pay | Admitting: Family Medicine

## 2016-11-03 DIAGNOSIS — F988 Other specified behavioral and emotional disorders with onset usually occurring in childhood and adolescence: Secondary | ICD-10-CM

## 2016-11-06 NOTE — Telephone Encounter (Signed)
Needs refill non methylphenidate.  [facility]

## 2016-11-11 MED ORDER — METHYLPHENIDATE HCL 10 MG PO TABS: 20.0000 mg | ORAL_TABLET | Freq: Three times a day (TID) | ORAL | 0 refills | Status: DC

## 2016-11-11 NOTE — Telephone Encounter (Signed)
VM left for pt informing her prescriptions are ready for pick up.

## 2016-11-11 NOTE — Addendum Note (Signed)
Addended by: Lissa Morales D on: 11/11/2016 08:58 AM   Modules accepted: Orders

## 2016-11-11 NOTE — Telephone Encounter (Signed)
Please let patient know her prescription(s) are available for pick up from the [facility] front desk.  

## 2016-11-12 ENCOUNTER — Encounter: Payer: Self-pay | Admitting: Family Medicine

## 2016-11-12 ENCOUNTER — Ambulatory Visit (INDEPENDENT_AMBULATORY_CARE_PROVIDER_SITE_OTHER): Payer: Medicare Other | Admitting: Family Medicine

## 2016-11-12 VITALS — BP 126/62 | HR 100 | Temp 98.3°F | Wt 205.0 lb

## 2016-11-12 DIAGNOSIS — G5602 Carpal tunnel syndrome, left upper limb: Secondary | ICD-10-CM | POA: Diagnosis not present

## 2016-11-12 DIAGNOSIS — E119 Type 2 diabetes mellitus without complications: Secondary | ICD-10-CM

## 2016-11-12 LAB — POCT GLYCOSYLATED HEMOGLOBIN (HGB A1C): Hemoglobin A1C: 7.2

## 2016-11-12 NOTE — Assessment & Plan Note (Signed)
Established problem worsened.  Failed attempts at conservative management with splinting, injection, NSAIDs.  Referral to Dr Cyndia Diver ([facility]) for consideration of carpel tunnel release.

## 2016-11-12 NOTE — Patient Instructions (Signed)
Restart your baby Aspirin (81 mg) every day to prevent strokes in women.   Your blood sugar control is very good !.  I agree with keeping the Jardiance and metformin at the current doses.  Eating regularly is a good way to avoid low blood sugars.

## 2016-11-12 NOTE — Progress Notes (Signed)
   Subjective:    Patient ID: [name], female    DOB: [birth year], 66 y.o.   MRN: [id number] ALEKSA ARAMBUL is alone Sources of clinical information for visit is/are patient and past medical records. Nursing assessment for this office visit was reviewed with the patient for accuracy and revision.   HPI CHRONIC DIABETES  Disease Monitoring  Blood Sugar Ranges: 100s  Polyuria: no   Visual problems: no   Medication Compliance: yes  Medication Side Effects  Hypoglycemia: yes, twice with self rescues   Wrist Pain -left wrist much worse than right. - Improvement after wrist injection that lasted as long as the anesthetic.  - not imrpvoed with conservative management via SM consult - Pain interfering with sleep, leaving her feeling sleep deprived in day.  - dropping objects with left hand      Review of Systems     Objective:   Physical Exam        Assessment & Plan:

## 2016-11-12 NOTE — Assessment & Plan Note (Addendum)
Established problem that has improved.  Continue Jardiance 10 mg daily and metformin 1500 mg daily RTC in 3 months, if remains with good glycemic control, will change to monitoring every 37-months  Sign ROI next ov for eye exam report and mammogram report

## 2016-11-13 ENCOUNTER — Telehealth: Payer: Self-pay | Admitting: *Deleted

## 2016-11-13 NOTE — Telephone Encounter (Signed)
[place] from [employer] left voice message stating they received a referral. However, they did not receive any patient demographics or insurance information.  Please fax to [phone] or give her a call at [phone].  Derl Barrow, RN

## 2016-11-13 NOTE — Telephone Encounter (Signed)
They received the automatic electronic "referral" that [employer] automatically sends to referring providers. I have sent all the needed info and advise [place] of this.

## 2016-11-13 NOTE — Telephone Encounter (Signed)
Will forward to referral coordinator. Guerin Lashomb,CMA  

## 2016-11-20 ENCOUNTER — Encounter: Payer: Self-pay | Admitting: *Deleted

## 2016-11-20 DIAGNOSIS — M19041 Primary osteoarthritis, right hand: Secondary | ICD-10-CM

## 2016-11-20 DIAGNOSIS — M180 Bilateral primary osteoarthritis of first carpometacarpal joints: Secondary | ICD-10-CM | POA: Diagnosis not present

## 2016-11-20 DIAGNOSIS — G5603 Carpal tunnel syndrome, bilateral upper limbs: Secondary | ICD-10-CM | POA: Diagnosis not present

## 2016-11-20 HISTORY — DX: Carpal tunnel syndrome, bilateral upper limbs: G56.03

## 2016-11-20 HISTORY — DX: Primary osteoarthritis, right hand: M19.041

## 2016-11-20 NOTE — Progress Notes (Signed)
I have reviewed this visit and discussed with Lauren Ducatte, RN, BSN, and agree with her documentation.   

## 2016-11-22 ENCOUNTER — Other Ambulatory Visit: Payer: Self-pay | Admitting: Family Medicine

## 2016-11-25 DIAGNOSIS — G5603 Carpal tunnel syndrome, bilateral upper limbs: Secondary | ICD-10-CM | POA: Diagnosis not present

## 2016-11-26 ENCOUNTER — Other Ambulatory Visit: Payer: Self-pay | Admitting: Orthopedic Surgery

## 2016-12-02 ENCOUNTER — Other Ambulatory Visit: Payer: Self-pay | Admitting: Family Medicine

## 2016-12-02 DIAGNOSIS — M47816 Spondylosis without myelopathy or radiculopathy, lumbar region: Secondary | ICD-10-CM

## 2016-12-02 DIAGNOSIS — M159 Polyosteoarthritis, unspecified: Secondary | ICD-10-CM

## 2016-12-02 DIAGNOSIS — M150 Primary generalized (osteo)arthritis: Secondary | ICD-10-CM

## 2016-12-02 DIAGNOSIS — Q762 Congenital spondylolisthesis: Secondary | ICD-10-CM

## 2016-12-02 DIAGNOSIS — M47817 Spondylosis without myelopathy or radiculopathy, lumbosacral region: Secondary | ICD-10-CM

## 2016-12-03 ENCOUNTER — Encounter (HOSPITAL_BASED_OUTPATIENT_CLINIC_OR_DEPARTMENT_OTHER): Payer: Self-pay | Admitting: *Deleted

## 2016-12-08 ENCOUNTER — Encounter (HOSPITAL_BASED_OUTPATIENT_CLINIC_OR_DEPARTMENT_OTHER)
Admission: RE | Admit: 2016-12-08 | Discharge: 2016-12-08 | Disposition: A | Discharge status: Home / Self Care | Payer: Medicare Other | Source: Ambulatory Visit | Attending: Orthopedic Surgery | Admitting: Orthopedic Surgery

## 2016-12-08 DIAGNOSIS — Z87891 Personal history of nicotine dependence: Secondary | ICD-10-CM | POA: Diagnosis not present

## 2016-12-08 DIAGNOSIS — E119 Type 2 diabetes mellitus without complications: Secondary | ICD-10-CM

## 2016-12-08 DIAGNOSIS — I10 Essential (primary) hypertension: Secondary | ICD-10-CM

## 2016-12-08 DIAGNOSIS — R000 Tachycardia, unspecified: Secondary | ICD-10-CM | POA: Insufficient documentation

## 2016-12-08 DIAGNOSIS — Z7984 Long term (current) use of oral hypoglycemic drugs: Secondary | ICD-10-CM | POA: Diagnosis not present

## 2016-12-08 DIAGNOSIS — K219 Gastro-esophageal reflux disease without esophagitis: Secondary | ICD-10-CM | POA: Diagnosis not present

## 2016-12-08 DIAGNOSIS — M1990 Unspecified osteoarthritis, unspecified site: Secondary | ICD-10-CM | POA: Diagnosis not present

## 2016-12-08 DIAGNOSIS — E7800 Pure hypercholesterolemia, unspecified: Secondary | ICD-10-CM | POA: Diagnosis not present

## 2016-12-08 DIAGNOSIS — G5603 Carpal tunnel syndrome, bilateral upper limbs: Secondary | ICD-10-CM | POA: Diagnosis not present

## 2016-12-08 DIAGNOSIS — Z96653 Presence of artificial knee joint, bilateral: Secondary | ICD-10-CM | POA: Diagnosis not present

## 2016-12-08 LAB — BASIC METABOLIC PANEL
ANION GAP: 12 (ref 5–15)
BUN: 11 mg/dL (ref 6–20)
CALCIUM: 9.3 mg/dL (ref 8.9–10.3)
CO2: 22 mmol/L (ref 22–32)
CREATININE: 0.79 mg/dL (ref 0.44–1.00)
Chloride: 103 mmol/L (ref 101–111)
GFR calc non Af Amer: >60 mL/min (ref >60)
Glucose, Bld: 153 mg/dL — ABNORMAL HIGH (ref 65–99)
Potassium: 4.3 mmol/L (ref 3.5–5.1)
SODIUM: 137 mmol/L (ref 135–145)

## 2016-12-10 ENCOUNTER — Ambulatory Visit (HOSPITAL_BASED_OUTPATIENT_CLINIC_OR_DEPARTMENT_OTHER): Payer: Medicare Other | Admitting: Anesthesiology

## 2016-12-10 ENCOUNTER — Encounter (HOSPITAL_BASED_OUTPATIENT_CLINIC_OR_DEPARTMENT_OTHER)
Admission: RE | Disposition: A | Discharge status: Home / Self Care | Payer: Self-pay | Source: Ambulatory Visit | Attending: Orthopedic Surgery

## 2016-12-10 ENCOUNTER — Encounter (HOSPITAL_BASED_OUTPATIENT_CLINIC_OR_DEPARTMENT_OTHER): Payer: Self-pay

## 2016-12-10 ENCOUNTER — Ambulatory Visit (HOSPITAL_BASED_OUTPATIENT_CLINIC_OR_DEPARTMENT_OTHER)
Admission: RE | Admit: 2016-12-10 | Discharge: 2016-12-10 | Disposition: A | Discharge status: Home / Self Care | Payer: Medicare Other | Source: Ambulatory Visit | Attending: Orthopedic Surgery | Admitting: Orthopedic Surgery

## 2016-12-10 DIAGNOSIS — I10 Essential (primary) hypertension: Secondary | ICD-10-CM | POA: Diagnosis not present

## 2016-12-10 DIAGNOSIS — K219 Gastro-esophageal reflux disease without esophagitis: Secondary | ICD-10-CM | POA: Insufficient documentation

## 2016-12-10 DIAGNOSIS — Z87891 Personal history of nicotine dependence: Secondary | ICD-10-CM | POA: Insufficient documentation

## 2016-12-10 DIAGNOSIS — E7800 Pure hypercholesterolemia, unspecified: Secondary | ICD-10-CM | POA: Insufficient documentation

## 2016-12-10 DIAGNOSIS — G5603 Carpal tunnel syndrome, bilateral upper limbs: Secondary | ICD-10-CM | POA: Diagnosis not present

## 2016-12-10 DIAGNOSIS — Z7984 Long term (current) use of oral hypoglycemic drugs: Secondary | ICD-10-CM | POA: Diagnosis not present

## 2016-12-10 DIAGNOSIS — Z96653 Presence of artificial knee joint, bilateral: Secondary | ICD-10-CM | POA: Insufficient documentation

## 2016-12-10 DIAGNOSIS — E119 Type 2 diabetes mellitus without complications: Secondary | ICD-10-CM | POA: Diagnosis not present

## 2016-12-10 DIAGNOSIS — G5602 Carpal tunnel syndrome, left upper limb: Secondary | ICD-10-CM | POA: Diagnosis not present

## 2016-12-10 DIAGNOSIS — M1990 Unspecified osteoarthritis, unspecified site: Secondary | ICD-10-CM | POA: Insufficient documentation

## 2016-12-10 HISTORY — PX: CARPAL TUNNEL RELEASE: SHX101

## 2016-12-10 LAB — GLUCOSE, CAPILLARY
GLUCOSE-CAPILLARY: 161 mg/dL — AB (ref 65–99)
Glucose-Capillary: 139 mg/dL — ABNORMAL HIGH (ref 65–99)

## 2016-12-10 SURGERY — CARPAL TUNNEL RELEASE
Anesthesia: Monitor Anesthesia Care | Site: Wrist | Laterality: Left

## 2016-12-10 MED ORDER — MEPERIDINE HCL 25 MG/ML IJ SOLN: 6.2500 mg | INTRAMUSCULAR | Status: DC | PRN

## 2016-12-10 MED ORDER — FENTANYL CITRATE (PF) 100 MCG/2ML IJ SOLN: 25.0000 ug | INTRAMUSCULAR | Status: DC | PRN

## 2016-12-10 MED ORDER — OXYCODONE HCL 5 MG/5ML PO SOLN: 5.0000 mg | Freq: Once | ORAL | Status: DC | PRN

## 2016-12-10 MED ORDER — ACETAMINOPHEN 325 MG PO TABS: 325.0000 mg | ORAL_TABLET | ORAL | Status: DC | PRN

## 2016-12-10 MED ORDER — HYDROCODONE-ACETAMINOPHEN 5-325 MG PO TABS
1.0000 | ORAL_TABLET | Freq: Four times a day (QID) | ORAL | 0 refills | Status: DC | PRN
Start: 2016-12-10 — End: 2017-01-25

## 2016-12-10 MED ORDER — LIDOCAINE HCL (PF) 0.5 % IJ SOLN
INTRAMUSCULAR | Status: DC | PRN
  Administered 2016-12-10: 30 mL via INTRAVENOUS

## 2016-12-10 MED ORDER — LACTATED RINGERS IV SOLN
INTRAVENOUS | Status: DC
  Administered 2016-12-10: 10:00:00 via INTRAVENOUS

## 2016-12-10 MED ORDER — ONDANSETRON HCL 4 MG/2ML IJ SOLN: 4.0000 mg | Freq: Once | INTRAMUSCULAR | Status: DC | PRN

## 2016-12-10 MED ORDER — ONDANSETRON HCL 4 MG/2ML IJ SOLN
INTRAMUSCULAR | Status: DC | PRN
  Administered 2016-12-10: 4 mg via INTRAVENOUS

## 2016-12-10 MED ORDER — FENTANYL CITRATE (PF) 100 MCG/2ML IJ SOLN
50.0000 ug | INTRAMUSCULAR | Status: DC | PRN
  Administered 2016-12-10: 100 ug via INTRAVENOUS

## 2016-12-10 MED ORDER — MIDAZOLAM HCL 2 MG/2ML IJ SOLN
1.0000 mg | INTRAMUSCULAR | Status: DC | PRN
  Administered 2016-12-10: 2 mg via INTRAVENOUS

## 2016-12-10 MED ORDER — CEFAZOLIN SODIUM-DEXTROSE 2-4 GM/100ML-% IV SOLN
INTRAVENOUS | Status: AC
Start: 2016-12-10 — End: 2016-12-10
  Filled 2016-12-10: qty 100

## 2016-12-10 MED ORDER — CHLORHEXIDINE GLUCONATE 4 % EX LIQD: 60.0000 mL | Freq: Once | CUTANEOUS | Status: DC

## 2016-12-10 MED ORDER — BUPIVACAINE HCL (PF) 0.25 % IJ SOLN
INTRAMUSCULAR | Status: DC | PRN
  Administered 2016-12-10: 7 mL

## 2016-12-10 MED ORDER — ACETAMINOPHEN 160 MG/5ML PO SOLN
325.0000 mg | ORAL | Status: DC | PRN
Start: 2016-12-10 — End: 2016-12-10

## 2016-12-10 MED ORDER — SCOPOLAMINE 1 MG/3DAYS TD PT72: 1.0000 | MEDICATED_PATCH | Freq: Once | TRANSDERMAL | Status: DC | PRN

## 2016-12-10 MED ORDER — FENTANYL CITRATE (PF) 100 MCG/2ML IJ SOLN
INTRAMUSCULAR | Status: AC
  Filled 2016-12-10: qty 2

## 2016-12-10 MED ORDER — MIDAZOLAM HCL 2 MG/2ML IJ SOLN
INTRAMUSCULAR | Status: AC
  Filled 2016-12-10: qty 2

## 2016-12-10 MED ORDER — CEFAZOLIN SODIUM-DEXTROSE 2-4 GM/100ML-% IV SOLN
2.0000 g | INTRAVENOUS | Status: AC
  Administered 2016-12-10: 2 g via INTRAVENOUS

## 2016-12-10 MED ORDER — OXYCODONE HCL 5 MG PO TABS: 5.0000 mg | ORAL_TABLET | Freq: Once | ORAL | Status: DC | PRN

## 2016-12-10 MED ORDER — PROPOFOL 10 MG/ML IV BOLUS
INTRAVENOUS | Status: DC | PRN
  Administered 2016-12-10: 20 mg via INTRAVENOUS

## 2016-12-10 SURGICAL SUPPLY — 37 items
BLADE SURG 15 STRL LF DISP TIS (BLADE) ×1 IMPLANT
BLADE SURG 15 STRL SS (BLADE) ×3
BNDG CMPR 9X4 STRL LF SNTH (GAUZE/BANDAGES/DRESSINGS)
BNDG COHESIVE 3X5 TAN STRL LF (GAUZE/BANDAGES/DRESSINGS) ×3 IMPLANT
BNDG ESMARK 4X9 LF (GAUZE/BANDAGES/DRESSINGS) IMPLANT
BNDG GAUZE ELAST 4 BULKY (GAUZE/BANDAGES/DRESSINGS) ×3 IMPLANT
CHLORAPREP W/TINT 26ML (MISCELLANEOUS) ×3 IMPLANT
CORDS BIPOLAR (ELECTRODE) ×3 IMPLANT
COVER BACK TABLE 60X90IN (DRAPES) ×3 IMPLANT
COVER MAYO STAND STRL (DRAPES) ×3 IMPLANT
CUFF TOURNIQUET SINGLE 18IN (TOURNIQUET CUFF) ×3 IMPLANT
DRAPE EXTREMITY T 121X128X90 (DRAPE) ×3 IMPLANT
DRAPE SURG 17X23 STRL (DRAPES) ×3 IMPLANT
DRSG PAD ABDOMINAL 8X10 ST (GAUZE/BANDAGES/DRESSINGS) ×3 IMPLANT
GAUZE SPONGE 4X4 12PLY STRL (GAUZE/BANDAGES/DRESSINGS) ×3 IMPLANT
GAUZE XEROFORM 1X8 LF (GAUZE/BANDAGES/DRESSINGS) ×3 IMPLANT
GLOVE BIO SURGEON STRL SZ 6.5 (GLOVE) ×2 IMPLANT
GLOVE BIO SURGEONS STRL SZ 6.5 (GLOVE) ×2
GLOVE BIOGEL PI IND STRL 7.0 (GLOVE) IMPLANT
GLOVE BIOGEL PI IND STRL 8.5 (GLOVE) ×1 IMPLANT
GLOVE BIOGEL PI INDICATOR 7.0 (GLOVE) ×6
GLOVE BIOGEL PI INDICATOR 8.5 (GLOVE) ×2
GLOVE SURG ORTHO 8.0 STRL STRW (GLOVE) ×3 IMPLANT
GOWN STRL REUS W/ TWL LRG LVL3 (GOWN DISPOSABLE) ×1 IMPLANT
GOWN STRL REUS W/TWL LRG LVL3 (GOWN DISPOSABLE) ×6
GOWN STRL REUS W/TWL XL LVL3 (GOWN DISPOSABLE) ×3 IMPLANT
NDL PRECISIONGLIDE 27X1.5 (NEEDLE) IMPLANT
NEEDLE PRECISIONGLIDE 27X1.5 (NEEDLE) IMPLANT
NS IRRIG 1000ML POUR BTL (IV SOLUTION) ×3 IMPLANT
PACK BASIN DAY SURGERY FS (CUSTOM PROCEDURE TRAY) ×3 IMPLANT
STOCKINETTE 4X48 STRL (DRAPES) ×3 IMPLANT
SUT ETHILON 4 0 PS 2 18 (SUTURE) ×3 IMPLANT
SUT VICRYL 4-0 PS2 18IN ABS (SUTURE) IMPLANT
SYR BULB 3OZ (MISCELLANEOUS) ×3 IMPLANT
SYR CONTROL 10ML LL (SYRINGE) IMPLANT
TOWEL OR 17X24 6PK STRL BLUE (TOWEL DISPOSABLE) ×3 IMPLANT
UNDERPAD 30X30 (UNDERPADS AND DIAPERS) ×3 IMPLANT

## 2016-12-10 NOTE — Addendum Note (Signed)
Addendum  created 12/10/16 1203 by Willa Frater, CRNA   Anesthesia Intra Flowsheets edited

## 2016-12-10 NOTE — Discharge Instructions (Addendum)

## 2016-12-10 NOTE — Anesthesia Postprocedure Evaluation (Addendum)
Anesthesia Post Note  Patient: [name]  Procedure(s) Performed: Procedure(s) (LRB): LEFT CARPAL TUNNEL RELEASE (Left)  Patient location during evaluation: PACU Anesthesia Type: MAC Level of consciousness: awake Pain management: pain level controlled Vital Signs Assessment: post-procedure vital signs reviewed and stable Respiratory status: spontaneous breathing Cardiovascular status: stable Postop Assessment: no signs of nausea or vomiting Anesthetic complications: no        Last Vitals:  Vitals:   12/10/16 1107 12/10/16 1115  BP: 131/61 136/64  Pulse: 89 80  Resp: 18 (!) 24  Temp: 36.6 C     Last Pain:  Vitals:   12/10/16 1107  TempSrc:   PainSc: 0-No pain   Pain Goal: Patients Stated Pain Goal: 2 (12/10/16 0949)               Christina Gintz JR,JOHN Mateo Flow

## 2016-12-10 NOTE — Anesthesia Procedure Notes (Signed)
Anesthesia Regional Block: Bier block (IV Regional)   Pre-Anesthetic Checklist: ,, timeout performed, Correct Patient, Correct Site, Correct Laterality, Correct Procedure,, site marked, surgical consent,, at surgeon's request Needles:  Injection technique: Single-shot  Needle Type: Other      Needle Gauge: 22     Additional Needles:   Procedures:,,,,,,, Esmarch exsanguination, single tourniquet utilized,  Narrative:   Performed by: Personally       

## 2016-12-10 NOTE — Brief Op Note (Signed)
12/10/2016  11:05 AM  PATIENT:  [name]  66 y.o. female  PRE-OPERATIVE DIAGNOSIS:  LEFT CARPAL TUNNEL SYNDROME  POST-OPERATIVE DIAGNOSIS:  LEFT CARPAL TUNNEL SYNDROME  PROCEDURE:  Procedure(s): LEFT CARPAL TUNNEL RELEASE (Left)  SURGEON:  Surgeon(s) and Role:    * Daryll Brod, MD - Primary  PHYSICIAN ASSISTANT:   ASSISTANTS: none   ANESTHESIA:   local and regional  EBL:  Total I/O In: 450 [I.V.:450] Out: 5 [Blood:5]  BLOOD ADMINISTERED:none  DRAINS: none   LOCAL MEDICATIONS USED:  BUPIVICAINE   SPECIMEN:  No Specimen  DISPOSITION OF SPECIMEN:  N/A  COUNTS:  YES  TOURNIQUET:   Total Tourniquet Time Documented: Forearm (Left) - 0 minutes Total: Forearm (Left) - 0 minutes   DICTATION: .Other Dictation: Dictation Number [id number]  PLAN OF CARE: Discharge to home after PACU  PATIENT DISPOSITION:  PACU - hemodynamically stable.

## 2016-12-10 NOTE — H&P (Signed)
[name] is an 66 y.o. female.   Chief Complaint: numbness left hand HPI: [name] is a 66 year old right-hand-dominant female referred by Dr. McDiarmid for consultation regarding left greater than right pain numbness tingling cramping thumb to ring fingers. This been going on for at least 3 years. She has a shooting pain like a toothache up with a VAS score of 9-10/10. She has had surgery on her neck done 2013 by Dr. Arnoldo Morale. She states whether increase her discomfort. She is presently on Naprosyn and Norco for pain. She has a history of diabetes arthritis no history of thyroid problems or gout. Family history is negative for diabetes thyroid problems positive arthritis negative for gout. She states nothing seems to make it better for she is awakened at night occasionally. Her nerve conductions are reviewed with her. This reveals a motor delay of 5.16 on her left side and no response on her right side.          Past Medical History:  Diagnosis Date  . Acute MEE (middle ear effusion) 06/07/2014  . Acute sinusitis 12/21/2014  . Anemia    hx  . ATTENTION DEFICIT, W/O HYPERACTIVITY 12/16/2006  . Cervical spondylosis with myelopathy, History of 04/28/2012  . DIABETES MELLITUS II, UNCOMPLICATED 6/72/0947  . DIFFUSE IDIOPATHIC SKELETAL HYPEROSTOSIS 03/29/2008  . GERD (gastroesophageal reflux disease)   . H/O abnormal mammogram 07/19/2009   S/P Breast Biopsy ([place], 07/2009): Benign findings.    . 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  . HEMORRHOIDS, NOS 12/16/2006   Qualifier: History of  By: McDiarmid MD, Sherren Mocha    . History of blood transfusion 10/2004   with knee replacement  . History of peptic ulcer 12/16/2006   UGI series PUD - 10/19/1998    . History of right greater trochanteric bursitis 11/07/2009  . HYPERCHOLESTEROLEMIA 02/28/2007  . HYPERTENSION, BENIGN SYSTEMIC 12/16/2006  . INCONTINENCE, URGE 12/16/2006   Qualifier: History of  By: McDiarmid MD, Sherren Mocha    .  OSTEOARTHRITIS, MULTI SITES 12/16/2006  . Perennial allergic rhinitis with seasonal variation 12/16/2006       . PONV (postoperative nausea and vomiting)    last surgery only  . SACROILIITIS, HISTORY OF 01/08/2009   Qualifier: History of  By: McDiarmid MD, Sherren Mocha    . Seasonal allergies   . Ulcer [facility])     Past Surgical History:  Procedure Laterality Date  . ANTERIOR CERVICAL DECOMP/DISCECTOMY FUSION  04/28/2012   Procedure: ANTERIOR CERVICAL DECOMPRESSION/DISCECTOMY FUSION 3 LEVELS;  Surgeon: Ophelia Charter, MD;  Location: [place] [facility];  Service: Neurosurgery;  Laterality: N/A;  Cervical three-four,Cervical four-five,Cervical five-six anterior cervical decompression with fusion interbody prothesis plating and bonegraft  . BREAST BIOPSY  07/2009   S/P Breast Biopsy [facility], [place], 07/2009): Benign findings.  (10/27/2010)  . COLONOSCOPY W/ POLYPECTOMY  12/2000   colonoscopy (Dr Collene Mares) int. hemorrhoids &  nonneoplatic colon polyps - 01/10/2001,   . COLONOSCOPY W/ POLYPECTOMY  01/2012   Dr Hilarie Fredrickson (GI).sigmoid colon, hyperlastic polyp  . ENDOMETRIAL BIOPSY  08/2004   Endometrial Biopsy - 09/01/2004,  . NM MYOVIEW LTD  04/2004   Cardiolite:EF63%, no ischemia - 05/01/2004,   . REPLACEMENT TOTAL KNEE BILATERAL      Family History  Problem Relation Age of Onset  . Hypertension Mother   . Arthritis Mother   . Cancer Sister 29    Breast  . Cancer Sister 32    Breast  . Arthritis Father   .  Diabetes Father   . Heart attack Sister   . Drug abuse Sister   . Colon cancer Neg Hx   . Esophageal cancer Neg Hx   . Rectal cancer Neg Hx   . Stomach cancer Neg Hx    Social History:  reports that she quit smoking about 37 years ago. Her smoking use included Cigarettes. She has a 4.00 pack-year smoking history. She has never used smokeless tobacco. She reports that she does not drink alcohol or use drugs.  Allergies:  Allergies  Allergen Reactions  . Carvedilol [Coreg] Shortness  Of Breath and Other (See Comments)    Chest tightness  . Januvia [Sitagliptin] Other (See Comments)    Epigastric abdominal pain  . Celecoxib Palpitations    REACTION: palpitations  . Diclofenac-Misoprostol Palpitations    REACTION: palpitation    No prescriptions prior to admission.    Results for orders placed or performed during the hospital encounter of 12/10/16 (from the past 48 hour(s))  Basic metabolic panel     Status: Abnormal   Collection Time: 12/08/16  3:00 PM  Result Value Ref Range   Sodium 137 135 - 145 mmol/L   Potassium 4.3 3.5 - 5.1 mmol/L   Chloride 103 101 - 111 mmol/L   CO2 22 22 - 32 mmol/L   Glucose, Bld 153 (H) 65 - 99 mg/dL   BUN 11 6 - 20 mg/dL   Creatinine, Ser 0.79 0.44 - 1.00 mg/dL   Calcium 9.3 8.9 - 10.3 mg/dL   GFR calc non Af Amer >60 >60 mL/min   GFR calc Af Amer >60 >60 mL/min    Comment: (NOTE) The eGFR has been calculated using the CKD EPI equation. This calculation has not been validated in all clinical situations. eGFR's persistently <60 mL/min signify possible Chronic Kidney Disease.    Anion gap 12 5 - 15    No results found.   Pertinent items are noted in HPI.  Height _0  (1.575 m), weight 90.7 kg (200 lb).  General appearance: alert, cooperative and appears stated age Head: Normocephalic, without obvious abnormality Neck: no JVD Resp: clear to auscultation bilaterally Cardio: regular rate and rhythm, S1, S2 normal, no murmur, click, rub or gallop GI: soft, non-tender; bowel sounds normal; no masses,  no organomegaly Extremities: numbness left hand Pulses: 2+ and symmetric Skin: Skin color, texture, turgor normal. No rashes or lesions Neurologic: Grossly normal Incision/Wound: na  Assessment/Plan Assessment:  1. Bilateral carpal tunnel syndrome    Plan: We have discussed possibly surgical intervention with her. Pre-peri-and postoperative course are discussed along with risks and complications. She is aware that  there is no guarantee to the surgery the possibility of infection recurrence injury to arteries nerves tendons incomplete release symptoms and dystrophy. Like to proceed but would like to have her left side done first. At this she is scheduled for left carpal tunnel release and outpatient under regional anesthesia.       [name] 12/10/2016, 9:16 AM

## 2016-12-10 NOTE — Op Note (Signed)
Dictation Number [id number]

## 2016-12-10 NOTE — Transfer of Care (Signed)
Immediate Anesthesia Transfer of Care Note  Patient: [name]  Procedure(s) Performed: Procedure(s): LEFT CARPAL TUNNEL RELEASE (Left)  Patient Location: PACU  Anesthesia Type:MAC and Bier block  Level of Consciousness: awake, alert  and oriented  Airway & Oxygen Therapy: Patient Spontanous Breathing and Patient connected to nasal cannula oxygen  Post-op Assessment: Report given to RN and Post -op Vital signs reviewed and stable  Post vital signs: Reviewed and stable  Last Vitals:  Vitals:   12/10/16 0949  BP: (!) 142/61  Pulse: 87  Resp: 18  Temp: 36.8 C    Last Pain:  Vitals:   12/10/16 0949  TempSrc: Oral  PainSc: 6       Patients Stated Pain Goal: 2 ([birth year] 9326)  Complications: No apparent anesthesia complications

## 2016-12-10 NOTE — Op Note (Signed)
NAMELONDAN, LEIFERMAN NO.:  [id number]  MEDICAL RECORD NO.:  [id number]  LOCATION:                                 FACILITY:  PHYSICIAN:  Daryll Brod, M.D.            DATE OF BIRTH:  DATE OF PROCEDURE:  12/10/2016 DATE OF DISCHARGE:                              OPERATIVE REPORT   PREOPERATIVE DIAGNOSIS:  Carpal tunnel syndrome, left hand.  POSTOPERATIVE DIAGNOSIS:  Carpal tunnel syndrome, left hand.  OPERATION:  Decompression of left median nerve.  SURGEON:  Daryll Brod, MD.  ANESTHESIA:  Forearm-based IV regional with sedation, local infiltration.  PLACE OF SURGERY:  [facility].  ANESTHESIOLOGIST:  Dr. Jillyn Hidden.  HISTORY:  The patient is a 66 year old female with a history of carpal tunnel syndrome.  EMG nerve conduction is positive, which has not responded to conservative treatment.  She has elected to undergo surgical decompression of the median nerve on her left side.  Pre, peri, and postoperative course had been discussed along with risks and complications.  She is aware that there is no guarantee to the surgery; the possibility of infection; recurrence of injury to arteries, nerves, tendons; incomplete relief of symptoms and dystrophy.  In preoperative area, the patient was seen, the extremity marked by both patient and surgeon.  Antibiotic given.  PROCEDURE IN DETAIL:  The patient was brought to the operating room, where a forearm-based IV regional anesthetic was carried out without difficulty under the direction of the Anesthesia Department.  She was prepped using ChloraPrep in supine position with the left arm free.  A 3- minute dry time was allowed, and a time-out taken, confirming the patient and procedure.  A longitudinal incision was made in the left palm, carried down through subcutaneous tissue.  Bleeders were electrocauterized with bipolar.  The palmar fascia was split revealing the superficial palmar arch and the flexor  tendon to the ring and little finger identified.  Retractors were placed protecting the median nerve radially and the ulnar nerve ulnarly.  The flexor retinaculum was then incised with sharp dissection with ulnar border.  A right angle and Sewell retractor were placed between distal forearm fascia and the subcutaneous tissue and skin.  The underlying nerve of the flexor tendons was then dissected free from the flexor retinaculum distal forearm fascia with blunt scissors.  This allowed the blunt scissors to be used to release the remainder of the flexor retinaculum and distal forearm fascia for approximately 2 cm proximal to the wrist crease under direct vision.  Canal was explored.  A persistent median artery was present.  An area compression to the nerve was apparent.  The motor branch entered into muscle distally.  Wound was copiously irrigated with saline and the skin closed with interrupted 4-0 nylon sutures.  A local infiltration with 0.25% bupivacaine without epinephrine was given. Approximately 7 mL was used.  A sterile compressive dressing with the fingers and thumb free was applied. On deflation of the tourniquet, all fingers immediately pinked.  She was taken to the recovery room for observation in satisfactory condition. She will be  discharged to home to return to [place] in 1 week, on Norco.          ______________________________ Daryll Brod, M.D.     GK/MEDQ  D:  12/10/2016  T:  12/10/2016  Job:  [id number]

## 2016-12-10 NOTE — Anesthesia Preprocedure Evaluation (Addendum)
Anesthesia Evaluation  Patient identified by MRN, date of birth, ID band Patient awake    Reviewed: Allergy & Precautions, NPO status , Patient's Chart, lab work & pertinent test results  Airway Mallampati: I       Dental no notable dental hx.    Pulmonary former smoker,    Pulmonary exam normal        Cardiovascular hypertension, Pt. on medications Normal cardiovascular exam     Neuro/Psych    GI/Hepatic GERD  Medicated and Controlled,  Endo/Other  diabetes, Type 2, Oral Hypoglycemic Agents  Renal/GU      Musculoskeletal   Abdominal (+) + obese,   Peds  Hematology   Anesthesia Other Findings   Reproductive/Obstetrics                            Anesthesia Physical Anesthesia Plan  ASA: III  Anesthesia Plan: MAC and Bier Block   Post-op Pain Management:    Induction:   Airway Management Planned: Natural Airway and Simple Face Mask  Additional Equipment:   Intra-op Plan:   Post-operative Plan:   Informed Consent: I have reviewed the patients History and Physical, chart, labs and discussed the procedure including the risks, benefits and alternatives for the proposed anesthesia with the patient or authorized representative who has indicated his/her understanding and acceptance.     Plan Discussed with: CRNA and Surgeon  Anesthesia Plan Comments:         Anesthesia Quick Evaluation

## 2016-12-11 ENCOUNTER — Encounter (HOSPITAL_BASED_OUTPATIENT_CLINIC_OR_DEPARTMENT_OTHER): Payer: Self-pay | Admitting: Orthopedic Surgery

## 2016-12-11 MED ORDER — HYDROCODONE-ACETAMINOPHEN 7.5-325 MG PO TABS: 1.0000 | ORAL_TABLET | Freq: Three times a day (TID) | ORAL | 0 refills | Status: DC | PRN

## 2016-12-11 NOTE — Telephone Encounter (Signed)
Pt called to check on the status of her request for Norco to be refilled. jw

## 2016-12-11 NOTE — Telephone Encounter (Signed)
Please let patient know her prescription(s) are available for pick up from the [facility] front desk.  

## 2016-12-11 NOTE — Telephone Encounter (Signed)
Patient is aware. Jazmin Hartsell,CMA  

## 2016-12-23 ENCOUNTER — Telehealth: Payer: Self-pay | Admitting: Family Medicine

## 2016-12-23 MED ORDER — AZITHROMYCIN 250 MG PO TABS: ORAL_TABLET | ORAL | 0 refills | Status: DC

## 2016-12-23 NOTE — Telephone Encounter (Signed)
Patient is aware that script was sent to pharmacy. Mysha Peeler,CMA

## 2016-12-23 NOTE — Telephone Encounter (Signed)
pt calling to request refill of:  Name of Medication(s):  z-pack Last date of OV:  11-12-16 Pharmacy:  [facility] on [address]  Will route refill request to [employer].  Discussed with patient policy to call pharmacy for future refills.  Also, discussed refills may take up to 48 hours to approve or deny.  [name]  Pt caught a bug from a family member, sinuses are bothering her. ep

## 2016-12-23 NOTE — Telephone Encounter (Signed)
/   Acute exacerbation of chronic sinusitis P/ Z-Pak x 1.  RTC if no improvement

## 2017-01-12 ENCOUNTER — Ambulatory Visit
Admission: RE | Admit: 2017-01-12 | Discharge: 2017-01-12 | Disposition: A | Discharge status: Home / Self Care | Payer: Medicare Other | Source: Ambulatory Visit | Attending: Family Medicine | Admitting: Family Medicine

## 2017-01-12 DIAGNOSIS — Z780 Asymptomatic menopausal state: Secondary | ICD-10-CM | POA: Diagnosis not present

## 2017-01-12 DIAGNOSIS — E2839 Other primary ovarian failure: Secondary | ICD-10-CM

## 2017-01-12 DIAGNOSIS — Z13820 Encounter for screening for osteoporosis: Secondary | ICD-10-CM | POA: Diagnosis not present

## 2017-01-22 ENCOUNTER — Telehealth: Payer: Self-pay | Admitting: Family Medicine

## 2017-01-22 DIAGNOSIS — M159 Polyosteoarthritis, unspecified: Secondary | ICD-10-CM

## 2017-01-22 DIAGNOSIS — M150 Primary generalized (osteo)arthritis: Secondary | ICD-10-CM

## 2017-01-22 DIAGNOSIS — Q762 Congenital spondylolisthesis: Secondary | ICD-10-CM

## 2017-01-22 DIAGNOSIS — M47816 Spondylosis without myelopathy or radiculopathy, lumbar region: Secondary | ICD-10-CM

## 2017-01-22 DIAGNOSIS — M47817 Spondylosis without myelopathy or radiculopathy, lumbosacral region: Secondary | ICD-10-CM

## 2017-01-22 NOTE — Telephone Encounter (Signed)
Needs refill on hydrocodone. Please call when ready for pickup

## 2017-01-25 MED ORDER — HYDROCODONE-ACETAMINOPHEN 7.5-325 MG PO TABS: 1.0000 | ORAL_TABLET | Freq: Three times a day (TID) | ORAL | 0 refills | Status: DC | PRN

## 2017-01-25 NOTE — Telephone Encounter (Signed)
Patient is aware of this. Sherrie Marsan,CMA  

## 2017-01-25 NOTE — Telephone Encounter (Signed)
Please let patient know her prescription(s) are available for pick up from the [facility] front desk.  

## 2017-03-22 ENCOUNTER — Other Ambulatory Visit: Payer: Self-pay | Admitting: Family Medicine

## 2017-03-22 DIAGNOSIS — M150 Primary generalized (osteo)arthritis: Secondary | ICD-10-CM

## 2017-03-22 DIAGNOSIS — Q762 Congenital spondylolisthesis: Secondary | ICD-10-CM

## 2017-03-22 DIAGNOSIS — M47816 Spondylosis without myelopathy or radiculopathy, lumbar region: Secondary | ICD-10-CM

## 2017-03-22 DIAGNOSIS — M159 Polyosteoarthritis, unspecified: Secondary | ICD-10-CM

## 2017-03-22 DIAGNOSIS — M47817 Spondylosis without myelopathy or radiculopathy, lumbosacral region: Secondary | ICD-10-CM

## 2017-03-22 NOTE — Telephone Encounter (Signed)
Needs refill on hydrocodone.  Please call when ready for pickup

## 2017-03-23 MED ORDER — HYDROCODONE-ACETAMINOPHEN 7.5-325 MG PO TABS: 1.0000 | ORAL_TABLET | Freq: Three times a day (TID) | ORAL | 0 refills | Status: DC | PRN

## 2017-03-23 NOTE — Telephone Encounter (Signed)
LM for patient letting her know script is ready for pick up. Merlon Alcorta,CMA

## 2017-03-23 NOTE — Telephone Encounter (Signed)
Please let patient know her prescription(s) are available for pick up from the [facility] front desk.  

## 2017-03-26 ENCOUNTER — Encounter (HOSPITAL_BASED_OUTPATIENT_CLINIC_OR_DEPARTMENT_OTHER): Payer: Self-pay | Admitting: Orthopedic Surgery

## 2017-03-26 NOTE — Addendum Note (Signed)
Addendum  created 03/26/17 1031 by Lyn Hollingshead, MD   Sign clinical note

## 2017-04-01 ENCOUNTER — Ambulatory Visit (INDEPENDENT_AMBULATORY_CARE_PROVIDER_SITE_OTHER): Payer: Medicare Other | Admitting: Family Medicine

## 2017-04-01 ENCOUNTER — Encounter: Payer: Self-pay | Admitting: Family Medicine

## 2017-04-01 VITALS — BP 114/58 | HR 98 | Temp 98.2°F | Wt 207.0 lb

## 2017-04-01 DIAGNOSIS — F988 Other specified behavioral and emotional disorders with onset usually occurring in childhood and adolescence: Secondary | ICD-10-CM | POA: Diagnosis not present

## 2017-04-01 DIAGNOSIS — E7800 Pure hypercholesterolemia, unspecified: Secondary | ICD-10-CM

## 2017-04-01 DIAGNOSIS — Z79891 Long term (current) use of opiate analgesic: Secondary | ICD-10-CM

## 2017-04-01 DIAGNOSIS — Z0000 Encounter for general adult medical examination without abnormal findings: Secondary | ICD-10-CM | POA: Diagnosis not present

## 2017-04-01 DIAGNOSIS — M47817 Spondylosis without myelopathy or radiculopathy, lumbosacral region: Secondary | ICD-10-CM | POA: Diagnosis not present

## 2017-04-01 DIAGNOSIS — E119 Type 2 diabetes mellitus without complications: Secondary | ICD-10-CM | POA: Diagnosis not present

## 2017-04-01 DIAGNOSIS — Q762 Congenital spondylolisthesis: Secondary | ICD-10-CM

## 2017-04-01 DIAGNOSIS — I10 Essential (primary) hypertension: Secondary | ICD-10-CM | POA: Diagnosis not present

## 2017-04-01 LAB — POCT GLYCOSYLATED HEMOGLOBIN (HGB A1C): Hemoglobin A1C: 8.8

## 2017-04-01 MED ORDER — HYDROCODONE-ACETAMINOPHEN 7.5-325 MG PO TABS: 1.0000 | ORAL_TABLET | Freq: Three times a day (TID) | ORAL | 0 refills | Status: DC | PRN

## 2017-04-01 MED ORDER — HYDROCODONE-ACETAMINOPHEN 7.5-325 MG PO TABS: 1.0000 | ORAL_TABLET | Freq: Four times a day (QID) | ORAL | 0 refills | Status: DC | PRN

## 2017-04-01 MED ORDER — METHYLPHENIDATE HCL 10 MG PO TABS: 20.0000 mg | ORAL_TABLET | Freq: Three times a day (TID) | ORAL | 0 refills | Status: DC

## 2017-04-01 MED ORDER — AMLODIPINE BESY-BENAZEPRIL HCL 10-20 MG PO CAPS: 1.0000 | ORAL_CAPSULE | Freq: Every day | ORAL | 3 refills | Status: DC

## 2017-04-01 MED ORDER — EMPAGLIFLOZIN 25 MG PO TABS: 25.0000 mg | ORAL_TABLET | Freq: Every day | ORAL | 3 refills | Status: DC

## 2017-04-01 MED ORDER — NAPROXEN 500 MG PO TABS: 500.0000 mg | ORAL_TABLET | Freq: Two times a day (BID) | ORAL | 3 refills | Status: DC

## 2017-04-01 NOTE — Assessment & Plan Note (Signed)
Established problem worsened.  A1c up to 8.8% from 7.2% Jan 2018 No increase in wieght, no report of non-adherence, no change in exercise, no evidence of infection  Plan: increase Jardiance to 25 mg daily and continue metformin 1500 mg a day. Could add DPP-4 or sulfonylurea if inadequate improvement Repeat diabetes diet ed RTC 3 months for A1c.

## 2017-04-01 NOTE — Assessment & Plan Note (Signed)
Established problem Controlled Continue current therapy

## 2017-04-01 NOTE — Assessment & Plan Note (Signed)
Established problem Controlled analgesia No evidence of adverse effect, aberrant behaviors Checked [place] CSRS with evidence of doctor shopping.

## 2017-04-01 NOTE — Assessment & Plan Note (Signed)
Established problem. Stable. Continue current therapy with Ritalin 20 mg TID

## 2017-04-01 NOTE — Progress Notes (Signed)
   Subjective:    Patient ID: MERNA BALDI, female    DOB: [birth year], 66 y.o.   MRN: [id number] MARWA FUHRMAN is accompanied by daughter and granddgt Sources of clinical information for visit is/are patient and past medical records. Nursing assessment for this office visit was reviewed with the patient for accuracy and revision.   HPI Problem List Items Addressed This Visit      Medium   Diabetes mellitus type 2, uncomplicated ([place]) - Primary CHRONIC DIABETES  Disease Monitoring  Blood Sugar Ranges: stopped chaecking  Polyuria: no   Visual problems: no   Medication Compliance: yes, Metformin 1500 mg a day and Jardiance 10 mg daily  Medication Side Effects  Hypoglycemia: no       Relevant Medications   amLODipine-benazepril (LOTREL) 10-20 MG capsule   empagliflozin (JARDIANCE) 25 MG TABS tablet   metFORMIN (GLUCOPHAGE-XR) 500 MG 24 hr tablet   Other Relevant Orders   HgB A1c (Completed)      Relevant Medications   methylphenidate (RITALIN) 10 MG tablet   HYPERTENSION, BENIGN SYSTEMIC CHRONIC HYPERTENSION  Disease Monitoring  Blood pressure range: not checking at home  Chest pain: no   Dyspnea: no   Claudication: no   Medication compliance: yes  Medication Side Effects  Lightheadedness: no   Urinary frequency: no   Edema: no     Preventitive Healthcare:  Exercise: no, no formal   Diet Pattern: fair  Salt Restriction: no     Relevant Medications   amLODipine-benazepril (LOTREL) 10-20 MG capsule     Low   SPONDYLOSIS, LUMBAR - longstanding issue - use of hydrocodone-apap 7.5 mg usually three a day - medication allows ability to perform ADL and iADLs.  - No constipation, confusion, falls. - no pain into legs   Relevant Medications   HYDROcodone-acetaminophen (NORCO) 7.5-325 MG tablet (Start on 04/22/2017)   HYDROcodone-acetaminophen (NORCO) 7.5-325 MG tablet (Start on 05/21/2017)   naproxen (NAPROSYN) 500 MG tablet   SPONDYLOLISTHESIS   Relevant  Medications   HYDROcodone-acetaminophen (NORCO) 7.5-325 MG tablet (Start on 04/22/2017)   HYDROcodone-acetaminophen (NORCO) 7.5-325 MG tablet (Start on 05/21/2017)   naproxen (NAPROSYN) 500 MG tablet     SH: no smoking   Review of Systems See HPI    Objective:   Physical Exam VS reviewed GEN: Alert, Cooperative, Groomed, NAD HEENT: PERRL; EAC bilaterally not occluded, TM's translucent with normal LM, (+) LR;                No cervical LAN, No thyromegaly, No palpable masses COR: RRR, No M/G/R, No JVD, Normal PMI size and location ABDOMEN: (+)BS, soft, NT, ND, No HSM, No palpable masses EXT: No peripheral leg edema.  Gait: Normal speed, No significant path deviation, Step through +,  Psych: Normal affect/thought/speech/language        Assessment & Plan:

## 2017-04-01 NOTE — Patient Instructions (Signed)
Increase Jardiance to 25 mg each morning.

## 2017-04-29 ENCOUNTER — Telehealth: Payer: Self-pay | Admitting: *Deleted

## 2017-04-29 NOTE — Telephone Encounter (Signed)
Prior Authorization received from [employer] for hydrocodone-Acetaminophen. Formulary and PA form placed in provider box for completion. Derl Barrow, RN

## 2017-04-30 NOTE — Telephone Encounter (Signed)
-----   Message from Blane Ohara McDiarmid, MD sent at 04/30/2017 11:40 AM EDT ----- Regarding: Pick up prescriptions Please let Mrs Wuest know that the refill prescriptions of Eric's Concerta and Ritalin is available for pickup at [facility] front desk.

## 2017-04-30 NOTE — Telephone Encounter (Signed)
Completed PA form for Hydrocodone-APAP 5-325 tablets.  Form given to Latina Craver to fax to authorizing agency

## 2017-04-30 NOTE — Telephone Encounter (Signed)
PA for hydrocodone-acetaminophen faxed to [place] for review along with office notes.  Derl Barrow, RN

## 2017-05-05 NOTE — Telephone Encounter (Signed)
PA approved for Hyrdrocodone-Acetaminophen 7.5 mg/325 mg via [employer] until 8/112/18.  Approval number: [id number].  Derl Barrow, RN

## 2017-05-27 ENCOUNTER — Other Ambulatory Visit: Payer: Self-pay | Admitting: Family Medicine

## 2017-05-27 MED ORDER — AZITHROMYCIN 250 MG PO TABS: ORAL_TABLET | ORAL | 0 refills | Status: DC

## 2017-07-27 ENCOUNTER — Ambulatory Visit (INDEPENDENT_AMBULATORY_CARE_PROVIDER_SITE_OTHER): Payer: Medicare Other | Admitting: *Deleted

## 2017-07-27 DIAGNOSIS — Z23 Encounter for immunization: Secondary | ICD-10-CM | POA: Diagnosis present

## 2017-08-02 ENCOUNTER — Telehealth: Payer: Self-pay | Admitting: *Deleted

## 2017-08-02 ENCOUNTER — Other Ambulatory Visit: Payer: Self-pay | Admitting: *Deleted

## 2017-08-02 DIAGNOSIS — Q762 Congenital spondylolisthesis: Secondary | ICD-10-CM

## 2017-08-02 DIAGNOSIS — M47817 Spondylosis without myelopathy or radiculopathy, lumbosacral region: Secondary | ICD-10-CM

## 2017-08-02 MED ORDER — HYDROCODONE-ACETAMINOPHEN 7.5-325 MG PO TABS: 1.0000 | ORAL_TABLET | Freq: Three times a day (TID) | ORAL | 0 refills | Status: DC | PRN

## 2017-08-02 MED ORDER — METHOCARBAMOL 750 MG PO TABS: ORAL_TABLET | ORAL | 1 refills | Status: DC

## 2017-08-02 NOTE — Telephone Encounter (Signed)
Will forward to MD.  Patient states that her scripts needs to be printed since they are going to [facility].  [name],CMA

## 2017-08-02 NOTE — Telephone Encounter (Signed)
LM for patient that script is ready for pick up. Jazmin Hartsell,CMA  

## 2017-08-02 NOTE — Telephone Encounter (Signed)
-----   Message from Blane Ohara McDiarmid, MD sent at 08/02/2017  2:59 PM EDT ----- Regarding: Pick up printed prescription Please let patient know her prescription(s) are available for pick up from the [facility] front desk.

## 2017-08-04 NOTE — Telephone Encounter (Signed)
Patient picked up on Monday. [name], [name], CMA

## 2017-08-05 ENCOUNTER — Other Ambulatory Visit: Payer: Self-pay | Admitting: *Deleted

## 2017-08-05 DIAGNOSIS — F988 Other specified behavioral and emotional disorders with onset usually occurring in childhood and adolescence: Secondary | ICD-10-CM

## 2017-08-09 MED ORDER — METHYLPHENIDATE HCL 10 MG PO TABS: 20.0000 mg | ORAL_TABLET | Freq: Three times a day (TID) | ORAL | 0 refills | Status: DC

## 2017-08-09 NOTE — Telephone Encounter (Signed)
Patient left message on nurse line asking about status of methylphenidate refill.

## 2017-08-09 NOTE — Telephone Encounter (Signed)
Please let patient know her prescription(s) are available for pick up from the [facility] front desk.  

## 2017-08-09 NOTE — Telephone Encounter (Signed)
Pts husband contacted and informed of rx ready for pickup.

## 2017-09-16 ENCOUNTER — Other Ambulatory Visit: Payer: Self-pay

## 2017-09-16 ENCOUNTER — Encounter: Payer: Self-pay | Admitting: Family Medicine

## 2017-09-16 ENCOUNTER — Ambulatory Visit (INDEPENDENT_AMBULATORY_CARE_PROVIDER_SITE_OTHER): Payer: Medicare Other | Admitting: Family Medicine

## 2017-09-16 VITALS — BP 130/64 | HR 92 | Ht 61.5 in | Wt 206.0 lb

## 2017-09-16 DIAGNOSIS — M159 Polyosteoarthritis, unspecified: Secondary | ICD-10-CM

## 2017-09-16 DIAGNOSIS — Q762 Congenital spondylolisthesis: Secondary | ICD-10-CM

## 2017-09-16 DIAGNOSIS — Z23 Encounter for immunization: Secondary | ICD-10-CM

## 2017-09-16 DIAGNOSIS — I10 Essential (primary) hypertension: Secondary | ICD-10-CM | POA: Diagnosis not present

## 2017-09-16 DIAGNOSIS — Z79899 Other long term (current) drug therapy: Secondary | ICD-10-CM

## 2017-09-16 DIAGNOSIS — M150 Primary generalized (osteo)arthritis: Secondary | ICD-10-CM | POA: Diagnosis not present

## 2017-09-16 DIAGNOSIS — E119 Type 2 diabetes mellitus without complications: Secondary | ICD-10-CM

## 2017-09-16 DIAGNOSIS — M47817 Spondylosis without myelopathy or radiculopathy, lumbosacral region: Secondary | ICD-10-CM

## 2017-09-16 LAB — POCT GLYCOSYLATED HEMOGLOBIN (HGB A1C): HEMOGLOBIN A1C: 7.9

## 2017-09-16 MED ORDER — HYDROCODONE-ACETAMINOPHEN 7.5-325 MG PO TABS: 1.0000 | ORAL_TABLET | Freq: Four times a day (QID) | ORAL | 0 refills | Status: DC | PRN

## 2017-09-16 MED ORDER — HYDROCODONE-ACETAMINOPHEN 7.5-325 MG PO TABS: 1.0000 | ORAL_TABLET | Freq: Three times a day (TID) | ORAL | 0 refills | Status: DC | PRN

## 2017-09-16 MED ORDER — FLUCONAZOLE 150 MG PO TABS: 150.0000 mg | ORAL_TABLET | Freq: Once | ORAL | 3 refills | Status: AC

## 2017-09-16 MED ORDER — METFORMIN HCL ER 500 MG PO TB24: ORAL_TABLET | ORAL | 3 refills | Status: DC

## 2017-09-16 NOTE — Assessment & Plan Note (Signed)
Lab Results  Component Value Date   HGBA1C 7.9 09/16/2017  Established problem that has improved.  Continue Jardiance and metformin

## 2017-09-16 NOTE — Assessment & Plan Note (Signed)
Established problem Controlled Continue current therapy regiment. Continue Naprosyn.  Check BMET Continue Norco 7.5mg .  Check of [place] 09/16/17 shoed no docotr shopping./  Given handwritten three Rxs for Norco 7.5/325 #90 now, in 30 days, and in 90 days.

## 2017-09-16 NOTE — Assessment & Plan Note (Signed)
Adequate blood pressure control.  No evidence of new end organ damage.  Tolerating medication without significant adverse effects.  Plan to continue current blood pressure regiment.   

## 2017-09-16 NOTE — Progress Notes (Signed)
   Subjective:    Patient ID: [name], female    DOB: 08-Nov-1950, 66 y.o.   MRN: [id number] [name] is accompanied by daughter and granddgt Sources of clinical information for visit is/are patient and past medical records. Nursing assessment for this office visit was reviewed with the patient for accuracy and revision.   HPI  Problem List Items Addressed This Visit      Medium   Diabetes mellitus type 2, uncomplicated ([place]) - Primary CHRONIC DIABETES  Disease Monitoring  Blood Sugar Ranges: stopped chaecking  Polyuria: no   Visual problems: no   Medication Compliance: yes, Metformin 1500 mg a day and Jardiance 10 mg daily  Medication Side Effects  Hypoglycemia: no       HYPERTENSION, BENIGN SYSTEMIC CHRONIC HYPERTENSION  Disease Monitoring  Blood pressure range: not checking at home  Chest pain: no   Dyspnea: no   Claudication: no   Medication compliance: yes  Medication Side Effects  Lightheadedness: no   Urinary frequency: no   Edema: no     Preventitive Healthcare:  Exercise: no, no formal   Diet Pattern: fair  Salt Restriction: no       Low   SPONDYLOSIS, LUMBAR - longstanding issue - use of hydrocodone-apap 7.5 mg usually three a day - medication allows ability to perform ADL and iADLs.  - No constipation, confusion, falls. No dyspepsia - no pain into legs   SPONDYLOLISTHESIS     SH: no smoking   Review of Systems  See HPI    Objective:   Physical Exam  VS reviewed GEN: Alert, Cooperative, Groomed, NAD HEENT: PERRL; EAC bilaterally not occluded, TM's translucent with normal LM, (+) LR;                No cervical LAN, No thyromegaly, No palpable masses COR: RRR, No M/G/R, No JVD, Normal PMI size and location ABDOMEN: (+)BS, soft, NT, ND, No HSM, No palpable masses EXT: No peripheral leg edema.  Gait: Normal speed, No significant path deviation, Step through +,  Psych: Normal affect/thought/speech/language          Assessment & Plan:  See problem list

## 2017-09-16 NOTE — Patient Instructions (Addendum)
Your blood sugar is better.  It is now 7.9%.  Ideally, we would like it to be less than 7.5%.    If your head congestion worsens, let Dr Damonte Frieson know.

## 2017-09-17 LAB — BASIC METABOLIC PANEL
BUN/Creatinine Ratio: 13 (ref 12–28)
BUN: 10 mg/dL (ref 8–27)
CALCIUM: 10 mg/dL (ref 8.7–10.3)
CHLORIDE: 100 mmol/L (ref 96–106)
CO2: 21 mmol/L (ref 20–29)
Creatinine, Ser: 0.79 mg/dL (ref 0.57–1.00)
GFR calc Af Amer: 90 mL/min/{1.73_m2} (ref >59)
GFR calc non Af Amer: 78 mL/min/{1.73_m2} (ref >59)
GLUCOSE: 152 mg/dL — AB (ref 65–99)
POTASSIUM: 4.5 mmol/L (ref 3.5–5.2)
SODIUM: 138 mmol/L (ref 134–144)

## 2017-09-20 ENCOUNTER — Telehealth: Payer: Self-pay | Admitting: Family Medicine

## 2017-09-20 MED ORDER — AZITHROMYCIN 250 MG PO TABS: ORAL_TABLET | ORAL | 0 refills | Status: DC

## 2017-09-20 NOTE — Telephone Encounter (Signed)
Will forward to MD to advise. Crissa Sowder,CMA  

## 2017-09-20 NOTE — Telephone Encounter (Signed)
Patient informed.  Jazmin Hartsell,CMA  

## 2017-09-20 NOTE — Telephone Encounter (Signed)
Pt is needing an antibotic for her sinus issues.  She has gotten worse since last week.  [facility] on [address]

## 2017-09-20 NOTE — Telephone Encounter (Signed)
Please notify patient that prescription/refill was sent into their pharmacy. [facility]  [address].     See prior ov note for details. A/ Persistent facial pain with nasal congestion for greater than 10 days in patient with history of chronic recurrent sinusitis. P/ Rx Azithromycin Z-pak 5 day

## 2017-11-10 ENCOUNTER — Other Ambulatory Visit: Payer: Self-pay

## 2017-11-10 DIAGNOSIS — J019 Acute sinusitis, unspecified: Principal | ICD-10-CM

## 2017-11-10 DIAGNOSIS — B9689 Other specified bacterial agents as the cause of diseases classified elsewhere: Secondary | ICD-10-CM

## 2017-11-10 NOTE — Telephone Encounter (Signed)
Patient left message on nurse line requesting an antibiotic be prescribed by Dr. McDiarmid for "another sinus infection". Routed to PCP for review.  Danley Danker, RN [facility] RN)

## 2017-11-11 NOTE — Telephone Encounter (Signed)
Patient left second message on nurse line requesting antibiotic for sinus infection. Hubbard Hartshorn, RN, BSN

## 2017-11-16 MED ORDER — AZITHROMYCIN 250 MG PO TABS: ORAL_TABLET | ORAL | 1 refills | Status: DC

## 2017-11-16 NOTE — Telephone Encounter (Signed)
Please let patient know prescription was sent into pharmacy

## 2017-11-16 NOTE — Telephone Encounter (Signed)
Pt is calling back and would like to know if Dr. McDiarmid would call her in a Z-pak. jw

## 2017-11-16 NOTE — Telephone Encounter (Signed)
Pt contacted and informed of rx ready for pick up at her pharmacy.

## 2017-12-10 ENCOUNTER — Other Ambulatory Visit: Payer: Self-pay

## 2017-12-10 DIAGNOSIS — F988 Other specified behavioral and emotional disorders with onset usually occurring in childhood and adolescence: Secondary | ICD-10-CM

## 2017-12-10 MED ORDER — METHYLPHENIDATE HCL 10 MG PO TABS: 20.0000 mg | ORAL_TABLET | Freq: Three times a day (TID) | ORAL | 0 refills | Status: DC

## 2017-12-10 NOTE — Telephone Encounter (Signed)
Patient informed.  Harpreet Signore,CMA  

## 2017-12-10 NOTE — Telephone Encounter (Signed)
Please let patient know her prescription(s) are available for pick up from the [facility] front desk.  

## 2017-12-10 NOTE — Telephone Encounter (Signed)
Handwritten 2 month supply Ritalin 10 mg tablets

## 2017-12-10 NOTE — Telephone Encounter (Signed)
Patient left message on nurse line requesting printed Rx for Ritalin to take to pharmacy at [facility] [place]. Danley Danker, RN [facility] RN)

## 2017-12-22 ENCOUNTER — Other Ambulatory Visit: Payer: Self-pay | Admitting: *Deleted

## 2017-12-22 DIAGNOSIS — J0191 Acute recurrent sinusitis, unspecified: Secondary | ICD-10-CM

## 2017-12-22 DIAGNOSIS — J302 Other seasonal allergic rhinitis: Secondary | ICD-10-CM

## 2017-12-22 DIAGNOSIS — J3089 Other allergic rhinitis: Principal | ICD-10-CM

## 2017-12-22 NOTE — Telephone Encounter (Signed)
Pt lm on nurse line.  She is requesting a refill on abx because her "sinus infection is still not cleared up" and a referral to ENT. Landa Mullinax, Salome Spotted, CMA

## 2017-12-24 MED ORDER — AMOXICILLIN-POT CLAVULANATE 875-125 MG PO TABS: 1.0000 | ORAL_TABLET | Freq: Two times a day (BID) | ORAL | 0 refills | Status: DC

## 2017-12-24 NOTE — Telephone Encounter (Signed)
Please let Mrs Schmoker know I sent in a two week course of Augmentin antibiotic to her pharmacy and will make a referral to an ENT physician at [place], [place] and [place].   Marland Kitchen

## 2017-12-24 NOTE — Telephone Encounter (Signed)
Pt contacted and informed of antibiotic sent into pharmacy and referral made to ENT. Pt was very appreciative.

## 2017-12-29 ENCOUNTER — Other Ambulatory Visit: Payer: Self-pay | Admitting: *Deleted

## 2017-12-29 DIAGNOSIS — J309 Allergic rhinitis, unspecified: Secondary | ICD-10-CM

## 2017-12-29 DIAGNOSIS — Q762 Congenital spondylolisthesis: Secondary | ICD-10-CM

## 2017-12-29 DIAGNOSIS — M47817 Spondylosis without myelopathy or radiculopathy, lumbosacral region: Secondary | ICD-10-CM

## 2017-12-29 NOTE — Telephone Encounter (Signed)
Pt request that all meds be printed. Aadil Sur, Salome Spotted, CMA

## 2017-12-30 MED ORDER — HYDROCODONE-ACETAMINOPHEN 7.5-325 MG PO TABS: 1.0000 | ORAL_TABLET | Freq: Three times a day (TID) | ORAL | 0 refills | Status: DC | PRN

## 2017-12-30 MED ORDER — FLUTICASONE PROPIONATE 50 MCG/ACT NA SUSP: 2.0000 | Freq: Every day | NASAL | 3 refills | Status: DC

## 2018-02-10 ENCOUNTER — Other Ambulatory Visit: Payer: Self-pay

## 2018-02-10 ENCOUNTER — Encounter: Payer: Self-pay | Admitting: Family Medicine

## 2018-02-10 ENCOUNTER — Ambulatory Visit (INDEPENDENT_AMBULATORY_CARE_PROVIDER_SITE_OTHER): Payer: Medicare Other | Admitting: Family Medicine

## 2018-02-10 VITALS — BP 134/78 | HR 112 | Temp 98.6°F | Ht 62.0 in | Wt 205.0 lb

## 2018-02-10 DIAGNOSIS — R112 Nausea with vomiting, unspecified: Secondary | ICD-10-CM

## 2018-02-10 DIAGNOSIS — K219 Gastro-esophageal reflux disease without esophagitis: Secondary | ICD-10-CM

## 2018-02-10 DIAGNOSIS — F988 Other specified behavioral and emotional disorders with onset usually occurring in childhood and adolescence: Secondary | ICD-10-CM | POA: Diagnosis not present

## 2018-02-10 DIAGNOSIS — M159 Polyosteoarthritis, unspecified: Secondary | ICD-10-CM

## 2018-02-10 DIAGNOSIS — M150 Primary generalized (osteo)arthritis: Secondary | ICD-10-CM | POA: Diagnosis not present

## 2018-02-10 DIAGNOSIS — M47817 Spondylosis without myelopathy or radiculopathy, lumbosacral region: Secondary | ICD-10-CM | POA: Diagnosis not present

## 2018-02-10 DIAGNOSIS — J309 Allergic rhinitis, unspecified: Secondary | ICD-10-CM

## 2018-02-10 DIAGNOSIS — J3089 Other allergic rhinitis: Secondary | ICD-10-CM

## 2018-02-10 DIAGNOSIS — J0191 Acute recurrent sinusitis, unspecified: Secondary | ICD-10-CM

## 2018-02-10 DIAGNOSIS — Q762 Congenital spondylolisthesis: Secondary | ICD-10-CM | POA: Diagnosis not present

## 2018-02-10 DIAGNOSIS — J0101 Acute recurrent maxillary sinusitis: Secondary | ICD-10-CM | POA: Diagnosis not present

## 2018-02-10 DIAGNOSIS — E119 Type 2 diabetes mellitus without complications: Secondary | ICD-10-CM

## 2018-02-10 DIAGNOSIS — J302 Other seasonal allergic rhinitis: Secondary | ICD-10-CM

## 2018-02-10 DIAGNOSIS — Z9889 Other specified postprocedural states: Secondary | ICD-10-CM

## 2018-02-10 DIAGNOSIS — I10 Essential (primary) hypertension: Secondary | ICD-10-CM | POA: Diagnosis not present

## 2018-02-10 LAB — POCT GLYCOSYLATED HEMOGLOBIN (HGB A1C): HEMOGLOBIN A1C: 7.8

## 2018-02-10 MED ORDER — AMOXICILLIN-POT CLAVULANATE 875-125 MG PO TABS: 1.0000 | ORAL_TABLET | Freq: Two times a day (BID) | ORAL | 0 refills | Status: DC

## 2018-02-10 MED ORDER — METHYLPHENIDATE HCL 10 MG PO TABS: 20.0000 mg | ORAL_TABLET | Freq: Three times a day (TID) | ORAL | 0 refills | Status: DC

## 2018-02-10 MED ORDER — FLUTICASONE PROPIONATE 50 MCG/ACT NA SUSP: 2.0000 | Freq: Every day | NASAL | 3 refills | Status: DC

## 2018-02-10 MED ORDER — HYDROCODONE-ACETAMINOPHEN 7.5-325 MG PO TABS: 1.0000 | ORAL_TABLET | Freq: Four times a day (QID) | ORAL | 0 refills | Status: DC | PRN

## 2018-02-10 MED ORDER — HYDROCODONE-ACETAMINOPHEN 7.5-325 MG PO TABS: 1.0000 | ORAL_TABLET | Freq: Three times a day (TID) | ORAL | 0 refills | Status: DC | PRN

## 2018-02-10 MED ORDER — METHOCARBAMOL 750 MG PO TABS: ORAL_TABLET | ORAL | 1 refills | Status: DC

## 2018-02-10 NOTE — Patient Instructions (Signed)
Your blood sugars look stable.  It would be great to get your A1c to less than 7.5 % to keep the peripheral neuropathy from getting worse.  Try increasing your metfromin to two tablets twice a day, if you can tolerate it.  If you can't, know that there are newer oral and injectable (once wee) that could also get your A1c to goal.

## 2018-02-11 ENCOUNTER — Encounter: Payer: Self-pay | Admitting: Family Medicine

## 2018-02-11 DIAGNOSIS — R112 Nausea with vomiting, unspecified: Secondary | ICD-10-CM | POA: Insufficient documentation

## 2018-02-11 DIAGNOSIS — Z9889 Other specified postprocedural states: Secondary | ICD-10-CM

## 2018-02-11 DIAGNOSIS — J0101 Acute recurrent maxillary sinusitis: Secondary | ICD-10-CM | POA: Insufficient documentation

## 2018-02-11 NOTE — Assessment & Plan Note (Signed)
Established problem Controlled Continue current therapy regiment of flonase.

## 2018-02-11 NOTE — Assessment & Plan Note (Signed)
Adequate blood pressure control.  No evidence of new end organ damage.  Tolerating medication without significant adverse effects.  Plan to continue current blood pressure regiment.   

## 2018-02-11 NOTE — Assessment & Plan Note (Addendum)
Adequate symptom control. Tolerating Hydrocodone/apap medication.  Review of [place] CSRS db with Rx only from [facility].  Continue current medication regiments including hydrocodone/apap, robaxin, and naproxen

## 2018-02-11 NOTE — Progress Notes (Signed)
Subjective:   [name] is alone Sources of clinical information for visit is/are patient and past medical records. Nursing assessment for this office visit was reviewed with the patient for accuracy and revision.  Previous Report(s) Reviewed: historical medical records and lab reports  Depression screen PHQ 2/9 02/10/2018  Decreased Interest 0  Down, Depressed, Hopeless 0  PHQ - 2 Score 0   Fall Risk  02/10/2018 09/16/2017 04/01/2017 11/12/2016 09/29/2016  Falls in the past year? No No No No No    HPI Problem List Items Addressed This Visit      Medium   Diabetes mellitus type 2, uncomplicated ([place]) - Primary CHRONIC DIABETES  Disease Monitoring  Blood Sugar Ranges: Not checking  Polyuria: no   Visual problems: no  Medication Compliance: Metformin 1500 mg daily and Jardiance (empaglizone) 25 mg daily Medication Side Effects  Hypoglycemia: no       HYPERTENSION, BENIGN SYSTEMIC CHRONIC HYPERTENSION  Disease Monitoring  Blood pressure range:not checking  Chest pain: no   Dyspnea: no   Claudication: no   Medication compliance: yes, lotrel  Medication Side Effects  Lightheadedness: no   Urinary frequency: no   Edema: no             Dysuria.urgency/frequency: no             Vaginal discharge/itching: no     Preventitive Healthcare:  Exercise: no formal exercise  Diet Pattern: fair  Salt Restriction: no  GERD - Pt has had this condition for years - Takes pme[razole  At dose 20 mg with frequency for one at a time with adequate control of symptoms.   - Condition interferes with comfort.   - Diarrhea no, Pneumonia no,  weight loss no  , melena no, hematochezia no, Dysphagia no, Odynophagia no, Disease Monitoring Typical Symptoms:   Heartburn, indigestion, or stomach acid coming up: yes History of EGD:no    Red Flag findings   Bleeding (hematemesis/melena):  no Odynophagia: no   Food sticking:  no   Weight Loss:  no  Melena/Hematochezia:  no Hematochezia Medication Monitoring Compliance:  Yes, takes as needed for indigestion symtoms for 2 week courses    Recent Pneumonia  no         Low   SPONDYLOSIS, LUMBAR - longstanding issue - Various use hydrocodone between 2 to 4 times a day; (+) robaxin use, daily Naprosyn use - Hydrocodone allows ability to perform ADL and iADLs.  - no constipation, confusion, falls. dyspepsia - pain into right prox leg   SPONDYLOLISTHESIS    Sinusitis - recurrent problem.  Currently quiessent.   - Much improved after recent Augmentin 2 wk therapy - Did not go for ENT consultation bc substantial imrovement with abx - taking flovent.  No nasal drainange, no facial pain  Adult ADD - Longstanding issue - Taking Ritalin 10 mg tablet, 2 tab tid. - Able to perfomr iADLs in timely fashion with med - No tics, no abnormal movements or focalizations, no weight loss  SH: no smoking, retired, cares for husband, young adult dgt, granddgt and adult son with Autism  ROS See HPI    Objective:   Physical Exam  Vitals:   02/10/18 0916  BP: 134/78  Pulse: (!) 112  Temp: 98.6 F (37 C)  SpO2: 96%    VS reviewed GEN: Alert, Cooperative, Groomed, NAD COR: RRR, No M/G/R, No JVD, Normal PMI size and location LUNGS: BCTA, No Acc mm use, speaking in full sentences  EXT: No peripheral leg edema. Feet without deformity or lesions. Palpable bilateral pedal pulses.  Diabetic Foot Exam - Simple   Simple Foot Form Diabetic Foot exam was performed with the following findings:  Yes 02/10/2018  9:24 AM  Visual Inspection Sensation Testing Pulse Check Comments    Gait: Normal speed, No significant path deviation, Step through +,  Psych: Normal affect/thought/speech/language      Assessment & Plan:  See problem list

## 2018-02-11 NOTE — Assessment & Plan Note (Signed)
Established problem. Stable. Continue Daily Flonase, especially during pollen season Pt given standby Augmentin Rx (good for one year) can self initiate if sinusitis symptoms recur without spontaneous resolution after 7 days.

## 2018-02-11 NOTE — Assessment & Plan Note (Signed)
Established problem Uncontrolled Lab Results  Component Value Date   HGBA1C 7.8 02/10/2018  Recommend goal of A1c below 7.5% in attempt to prevent progression of "pins n' needles" paresthesia of diabetic neuropathy in feet. Recommend trial of increase metformin to 2000 mg daily from 1500 mg daily. Continue empagliflozin 25 mg daily

## 2018-02-11 NOTE — Assessment & Plan Note (Addendum)
Established problem Controlled Continue current therapy regiment: PRN 2 wk therapies with exacerbations

## 2018-02-11 NOTE — Assessment & Plan Note (Signed)
Established problem Tolerating ritalin  Controlled Review of [place] CSRS db without evidence of doctor shopping Continue current therapy regiment ritalin 10 mg tablet, 2 tablets TID.

## 2018-02-11 NOTE — Assessment & Plan Note (Signed)
Established problem Rx for Augmentin 2 week course for persistent sinusitis symptoms .  Pt may self initiate.  Rx one time fill standing for one year.

## 2018-02-14 ENCOUNTER — Telehealth: Payer: Self-pay

## 2018-02-14 NOTE — Telephone Encounter (Signed)
Received fax from [facility] requesting prior authorization of Hydrocodone-Acetaminophen.  PA submitted via [employer]. Status is pending. Will recheck status in 24 hours. Danley Danker, RN [facility] RN)

## 2018-02-15 NOTE — Telephone Encounter (Signed)
Status still pending. Will recheck status in 24 hours. Alisa Brake, RN ([facility] RN)  

## 2018-02-15 NOTE — Telephone Encounter (Signed)
Prior approval for Hydrocodone completed via [place] [employer]. Med approved for 02/14/18 - 08/13/18. Prior approval [id number]. [place] pharmacy informed.  Danley Danker, RN [facility] RN)

## 2018-03-29 ENCOUNTER — Other Ambulatory Visit: Payer: Self-pay | Admitting: *Deleted

## 2018-03-29 DIAGNOSIS — Q762 Congenital spondylolisthesis: Secondary | ICD-10-CM

## 2018-03-29 DIAGNOSIS — M47817 Spondylosis without myelopathy or radiculopathy, lumbosacral region: Secondary | ICD-10-CM

## 2018-03-29 NOTE — Telephone Encounter (Signed)
I left message on voice mail for [name] that she should have a prescription for her hydrocodone/apap dated 02/10/18 to be filled 60 days after this date (04/12/18).  If there is a problem with this prescription to be filled 04/12/18, I asked that she let us know.  Refills of the hydrocodone/apap should be in the last week of her last prescription.

## 2018-03-29 NOTE — Telephone Encounter (Signed)
Pt called nurse line.   Pharmacy does not have her refills.   I contacted pharmacy to verify, they only received one refill on 02/10/18.  They never received the one for 30 days and 60 days after.   Will forward to MD for refill. Briscoe Daniello, Salome Spotted, CMA

## 2018-03-30 ENCOUNTER — Other Ambulatory Visit: Payer: Self-pay

## 2018-03-30 DIAGNOSIS — Q762 Congenital spondylolisthesis: Secondary | ICD-10-CM

## 2018-03-30 DIAGNOSIS — M47817 Spondylosis without myelopathy or radiculopathy, lumbosacral region: Secondary | ICD-10-CM

## 2018-03-30 NOTE — Telephone Encounter (Signed)
Patient left message that [facility] did not get all three prescriptions for Hydrocodone sent on 02/10/18 and she needs a refill.  Called [facility] and spoke with Juliann Pulse, pharmacist. She confirmed that only two electronic prescriptions were received.   Last filled 02/26/18. No remaining prescriptions to fill. Please send new one.  Patient [id number] [facility] [phone]   Danley Danker, RN [facility] RN)

## 2018-03-31 MED ORDER — HYDROCODONE-ACETAMINOPHEN 7.5-325 MG PO TABS: 1.0000 | ORAL_TABLET | Freq: Three times a day (TID) | ORAL | 0 refills | Status: DC | PRN

## 2018-03-31 NOTE — Telephone Encounter (Signed)
LM for patient that script was sent to the pharmacy.  Cache Decoursey,CMA

## 2018-03-31 NOTE — Telephone Encounter (Signed)
Please let patient know her prescriptions were sent into their pharmacy

## 2018-05-02 ENCOUNTER — Other Ambulatory Visit: Payer: Self-pay

## 2018-05-02 ENCOUNTER — Other Ambulatory Visit: Payer: Self-pay | Admitting: Family Medicine

## 2018-05-02 DIAGNOSIS — J0191 Acute recurrent sinusitis, unspecified: Secondary | ICD-10-CM

## 2018-05-02 DIAGNOSIS — Q762 Congenital spondylolisthesis: Secondary | ICD-10-CM

## 2018-05-02 DIAGNOSIS — M47817 Spondylosis without myelopathy or radiculopathy, lumbosacral region: Secondary | ICD-10-CM

## 2018-05-02 MED ORDER — HYDROCODONE-ACETAMINOPHEN 7.5-325 MG PO TABS: 1.0000 | ORAL_TABLET | Freq: Three times a day (TID) | ORAL | 0 refills | Status: DC | PRN

## 2018-06-06 ENCOUNTER — Other Ambulatory Visit: Payer: Self-pay

## 2018-06-06 DIAGNOSIS — Q762 Congenital spondylolisthesis: Secondary | ICD-10-CM

## 2018-06-06 DIAGNOSIS — Z79891 Long term (current) use of opiate analgesic: Secondary | ICD-10-CM

## 2018-06-06 DIAGNOSIS — M47817 Spondylosis without myelopathy or radiculopathy, lumbosacral region: Secondary | ICD-10-CM

## 2018-06-07 MED ORDER — HYDROCODONE-ACETAMINOPHEN 7.5-325 MG PO TABS: 1.0000 | ORAL_TABLET | Freq: Four times a day (QID) | ORAL | 0 refills | Status: DC | PRN

## 2018-06-09 ENCOUNTER — Encounter: Payer: Self-pay | Admitting: Family Medicine

## 2018-06-09 ENCOUNTER — Ambulatory Visit (INDEPENDENT_AMBULATORY_CARE_PROVIDER_SITE_OTHER): Payer: Medicare Other | Admitting: Family Medicine

## 2018-06-09 ENCOUNTER — Other Ambulatory Visit: Payer: Self-pay

## 2018-06-09 VITALS — BP 154/86 | HR 109 | Temp 99.0°F | Ht 62.0 in | Wt 198.0 lb

## 2018-06-09 DIAGNOSIS — E119 Type 2 diabetes mellitus without complications: Secondary | ICD-10-CM | POA: Diagnosis not present

## 2018-06-09 DIAGNOSIS — M5441 Lumbago with sciatica, right side: Secondary | ICD-10-CM | POA: Diagnosis not present

## 2018-06-09 DIAGNOSIS — I10 Essential (primary) hypertension: Secondary | ICD-10-CM

## 2018-06-09 DIAGNOSIS — M5442 Lumbago with sciatica, left side: Secondary | ICD-10-CM | POA: Diagnosis not present

## 2018-06-09 DIAGNOSIS — M5416 Radiculopathy, lumbar region: Secondary | ICD-10-CM | POA: Diagnosis not present

## 2018-06-09 DIAGNOSIS — E1159 Type 2 diabetes mellitus with other circulatory complications: Secondary | ICD-10-CM

## 2018-06-09 LAB — POCT GLYCOSYLATED HEMOGLOBIN (HGB A1C): HbA1c, POC (controlled diabetic range): 8.2 % — AB (ref 0.0–7.0)

## 2018-06-09 MED ORDER — EMPAGLIFLOZIN 10 MG PO TABS: 10.0000 mg | ORAL_TABLET | Freq: Every day | ORAL | 2 refills | Status: DC

## 2018-06-09 MED ORDER — GABAPENTIN 100 MG PO CAPS: 100.0000 mg | ORAL_CAPSULE | Freq: Three times a day (TID) | ORAL | 3 refills | Status: DC | PRN

## 2018-06-09 NOTE — Patient Instructions (Signed)
Change Jardiance form 25 mg daily to 10 mg daily. Restart blood pressure med Start Gabapentin for nerve pain from spine Physical Therapy for muscle spasm in back.

## 2018-06-10 ENCOUNTER — Encounter: Payer: Self-pay | Admitting: Family Medicine

## 2018-06-10 DIAGNOSIS — M5442 Lumbago with sciatica, left side: Secondary | ICD-10-CM

## 2018-06-10 DIAGNOSIS — M5441 Lumbago with sciatica, right side: Secondary | ICD-10-CM | POA: Insufficient documentation

## 2018-06-10 NOTE — Assessment & Plan Note (Signed)
New problem Plan further workup Ddx: Musculoskeletal, osteoarthritis, degenerative disk disease, Herniated disk, Spinal stenosis, SIJ Plan: LS spine XR          Gabapentin 100 mg TID prn moderate pain          APAP prn          Pt may use her chronic Norco for this acute pain.           Relative rest, heat          Referral for PT          RTC 2 weeks to assess progress.

## 2018-06-10 NOTE — Progress Notes (Signed)
Subjective:    Patient ID: [name], female    DOB: 1950/12/30, 67 y.o.   MRN: [id number] ALASHA MCGUINNESS is alone Sources of clinical information for visit is/are patient and past medical records. Nursing assessment for this office visit was reviewed with the patient for accuracy and revision.  Previous Report(s) Reviewed: historical medical records and radiology reports  Depression screen [facility] 2/9 06/09/2018  Decreased Interest 0  Down, Depressed, Hopeless 0  PHQ - 2 Score 0   Fall Risk  06/09/2018 02/10/2018 09/16/2017 04/01/2017 11/12/2016  Falls in the past year? Yes No No No No  Number falls in past yr: 2 or more - - - -  Injury with Fall? No - - - -    HPI  BACK PAIN  Location: low back, midline Quality: burning, aching  Onset: 2 weeks ago, gradual onset and worsening Worse with: sitting, standing  Better with: repositioning   Radiation: both legs, Left greater than right leg, left leg with occasional radition below knee.  No "electric" shock pains Trauma: none Best sitting/standing/leaning forward: yes, briefly  Red Flags Fecal/urinary incontinence: no  Numbness/Weakness: no  Fever/chills/sweats: no  Night pain: no  Unexplained weight loss: no  No relief with bedrest: no  h/o cancer/immunosuppression: no  IV drug use: no  PMH of osteoporosis or chronic steroid use: no  Prior spinal CN:OBSJGG spondylosis, spondylolisthesis, clinical sacroiliitis, cerivcal disc disease requiring ADCF.    Prior [place]: 05/2010: LS spine XR:  There is mild grade 1 anterolisthesis of L4 on L5, and question of mild narrowing at the L5-S1 intervertebral disc space.  Prominent anterior and lateral osteophytes are noted along the lower thoracic and lumbar spine.  Bilateral facet disease is seen. DEXA 2018: WNL Prior therapies: cervical disc surgery   CHRONIC DIABETES  Disease Monitoring  Blood Sugar Ranges: running in 100s to 200s  Polyuria: no   Visual problems: no   Medication  Compliance: no, stopped Jardiance because of dizziness with standing that started after increase Jardiance dose from 10 mg daily to 25 mg daily.   Medication Side Effects  Hypoglycemia: no   Preventitive Health Care             Daily Aspirin: no              Statin: Atorvastatin 40 mg daily             Recent eGFR: > 60             ACEI: yes  Eye Exam: Yes  Foot Exam: Yes  Diet pattern: fair  Exercise: no formal  CHRONIC HYPERTENSION  Disease Monitoring  Blood pressure range: not checking at home  Chest pain: no   Dyspnea: no   Claudication: no   Medication compliance: no, stopped her Lotrel bc of lightheadedness withstanding and dark urine.   Medication Side Effects  Lightheadedness: yes   Urinary frequency: no   Edema: no     SH: No smoking   Review of Systems     Objective:   Physical Exam  VS reviewed GEN: Alert, Cooperative, Groomed, NAD Back Exam: Inspection: no deformity Motion: stiff motion with rising R SLR: back pain Left SLR: neg Palpable tenderness: midline low lumbar-sacrum region FABER: neg Sensory change: none Reflex change: 0/0 bilateral knees and ankles  Strength at foot Plantar-flexion:  5/ 5    Dorsi-flexion: 5 / 5    Eversion: 5 / 5   Inversion: 5 / 5  Leg strength Quad:  5/ 5   Hamstring: 5 / 5   Hip flexor: 5 / 5   Hip abductors:5  / 5  Hip: int/ext rotation without pain.   Gait: Slow, no favoring in stance phase, shorter stride   Diabetic Foot Exam - Simple   Simple Foot Form Diabetic Foot exam was performed with the following findings:  Yes 06/09/2018 10:33 AM  Visual Inspection No deformities, no ulcerations, no other skin breakdown bilaterally:  Yes Sensation Testing Intact to touch and monofilament testing bilaterally:  Yes Pulse Check Posterior Tibialis and Dorsalis pulse intact bilaterally:  Yes Comments        Assessment & Plan:  Visit Problem List with A/P  Diabetes mellitus type 2, uncomplicated (HCC) Diabetes  Mellitus Type Two with hypertension and obesity Uncontrolled with A1c at 8.2 percent.  Goal less than 8.0. Orthostatic Intolerant of Jardiance 25 mg daily but not at Jardiance 10 mg daily  Plan Continue Metformin 2000 mg daily          Restart Jardiance at 10 mg daily          RTC 3 months for glycemic recheck           Hypertension associated with diabetes ([place]) Established problem worsened.  Patient stopped Lotrel bc thought it was causing lightheadedness and dark urine PLan Restart Lotrel  RTC 3 months to assess BP control

## 2018-06-10 NOTE — Assessment & Plan Note (Addendum)
Diabetes Mellitus Type Two with hypertension and obesity Uncontrolled with A1c at 8.2 percent.  Goal less than 8.0. Orthostatic Intolerant of Jardiance 25 mg daily but not at Jardiance 10 mg daily  Plan Continue Metformin 2000 mg daily          Restart Jardiance at 10 mg daily          RTC 3 months for glycemic recheck

## 2018-06-10 NOTE — Assessment & Plan Note (Signed)
Established problem worsened.  Patient stopped Lotrel bc thought it was causing lightheadedness and dark urine PLan Restart Lotrel  RTC 3 months to assess BP control

## 2018-06-17 ENCOUNTER — Ambulatory Visit
Admission: RE | Admit: 2018-06-17 | Discharge: 2018-06-17 | Disposition: A | Discharge status: Home / Self Care | Payer: Medicare Other | Source: Ambulatory Visit | Attending: Family Medicine | Admitting: Family Medicine

## 2018-06-17 DIAGNOSIS — M5416 Radiculopathy, lumbar region: Secondary | ICD-10-CM

## 2018-06-17 DIAGNOSIS — M5116 Intervertebral disc disorders with radiculopathy, lumbar region: Secondary | ICD-10-CM | POA: Diagnosis not present

## 2018-06-23 ENCOUNTER — Other Ambulatory Visit: Payer: Self-pay | Admitting: *Deleted

## 2018-06-23 ENCOUNTER — Ambulatory Visit (INDEPENDENT_AMBULATORY_CARE_PROVIDER_SITE_OTHER): Payer: Medicare Other | Admitting: Family Medicine

## 2018-06-23 ENCOUNTER — Encounter: Payer: Self-pay | Admitting: Family Medicine

## 2018-06-23 ENCOUNTER — Other Ambulatory Visit: Payer: Self-pay

## 2018-06-23 VITALS — BP 144/84 | HR 113 | Temp 98.2°F | Ht 62.0 in | Wt 198.0 lb

## 2018-06-23 DIAGNOSIS — Z23 Encounter for immunization: Secondary | ICD-10-CM | POA: Diagnosis not present

## 2018-06-23 DIAGNOSIS — E1159 Type 2 diabetes mellitus with other circulatory complications: Secondary | ICD-10-CM

## 2018-06-23 DIAGNOSIS — M5416 Radiculopathy, lumbar region: Secondary | ICD-10-CM

## 2018-06-23 DIAGNOSIS — E119 Type 2 diabetes mellitus without complications: Secondary | ICD-10-CM | POA: Diagnosis not present

## 2018-06-23 DIAGNOSIS — M47817 Spondylosis without myelopathy or radiculopathy, lumbosacral region: Secondary | ICD-10-CM | POA: Diagnosis not present

## 2018-06-23 DIAGNOSIS — I10 Essential (primary) hypertension: Secondary | ICD-10-CM | POA: Diagnosis not present

## 2018-06-23 DIAGNOSIS — Q762 Congenital spondylolisthesis: Secondary | ICD-10-CM | POA: Diagnosis not present

## 2018-06-23 DIAGNOSIS — F988 Other specified behavioral and emotional disorders with onset usually occurring in childhood and adolescence: Secondary | ICD-10-CM | POA: Diagnosis not present

## 2018-06-23 DIAGNOSIS — I152 Hypertension secondary to endocrine disorders: Secondary | ICD-10-CM

## 2018-06-23 MED ORDER — METHOCARBAMOL 750 MG PO TABS: ORAL_TABLET | ORAL | 99 refills | Status: DC

## 2018-06-23 MED ORDER — METHYLPHENIDATE HCL 10 MG PO TABS: 20.0000 mg | ORAL_TABLET | Freq: Three times a day (TID) | ORAL | 0 refills | Status: DC

## 2018-06-23 NOTE — Patient Instructions (Signed)
Stopping the Jardiance  Taking metformin 2000 mg daily Increasing physical activituy and exercise.  Watching diet.   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 Try taking acetaminophen extra strength three times a day, regularly Take Advil and Norco for breakthrough pain Will see if Physical therapy can help with the muscle spasm in back.    Osteoarthritis Osteoarthritis is a type of arthritis that affects tissue that covers the ends of bones in joints (cartilage). Cartilage acts as a cushion between the bones and helps them move smoothly. Osteoarthritis results when cartilage in the joints gets worn down. Osteoarthritis is sometimes called "wear and tear" arthritis. Osteoarthritis is the most common form of arthritis. It often occurs in older people. It is a condition that gets worse over time (a progressive condition). Joints that are most often affected by this condition are in:  Fingers.  Toes.  Hips.  Knees.  Spine, including neck and lower back.  What are the causes? This condition is caused by age-related wearing down of cartilage that covers the ends of bones. What increases the risk? The following factors may make you more likely to develop this condition:  Older age.  Being overweight or obese.  Overuse of joints, such as in athletes.  Past injury of a joint.  Past surgery on a joint.  Family history of osteoarthritis.  What are the signs or symptoms? The main symptoms of this condition are pain, swelling, and stiffness in the joint. The joint may lose its shape over time. Small pieces of bone or cartilage may break off and float inside of the joint, which may cause more pain and damage to the joint. Small deposits of bone (osteophytes) may grow on the edges of the joint. Other symptoms may include:  A grating or scraping feeling inside the joint when you move it.  Popping or creaking sounds when you move.  Symptoms may affect one or more joints. Osteoarthritis  in a major joint, such as your knee or hip, can make it painful to walk or exercise. If you have osteoarthritis in your hands, you might not be able to grip items, twist your hand, or control small movements of your hands and fingers (fine motor skills). How is this diagnosed? This condition may be diagnosed based on:  Your medical history.  A physical exam.  Your symptoms.  X-rays of the affected joint(s).  Blood tests to rule out other types of arthritis.  How is this treated? There is no cure for this condition, but treatment can help to control pain and improve joint function. Treatment plans may include:  A prescribed exercise program that allows for rest and joint relief. You may work with a physical therapist.  A weight control plan.  Pain relief techniques, such as: ? Applying heat and cold to the joint. ? Electric pulses delivered to nerve endings under the skin (transcutaneous electrical nerve stimulation, or TENS). ? Massage. ? Certain nutritional supplements.  NSAIDs or prescription medicines to help relieve pain.  Medicine to help relieve pain and inflammation (corticosteroids). This can be given by mouth (orally) or as an injection.  Assistive devices, such as a brace, wrap, splint, specialized glove, or cane.  Surgery, such as: ? An osteotomy. This is done to reposition the bones and relieve pain or to remove loose pieces of bone and cartilage. ? Joint replacement surgery. You may need this surgery if you have very bad (advanced) osteoarthritis.  Follow these instructions at home: Activity  Rest your affected joints  as directed by your health care provider.  Do not drive or use heavy machinery while taking prescription pain medicine.  Exercise as directed. Your health care provider or physical therapist may recommend specific types of exercise, such as: ? Strengthening exercises. These are done to strengthen the muscles that support joints that are affected  by arthritis. They can be performed with weights or with exercise bands to add resistance. ? Aerobic activities. These are exercises, such as brisk walking or water aerobics, that get your heart pumping. ? Range-of-motion activities. These keep your joints easy to move. ? Balance and agility exercises. Managing pain, stiffness, and swelling  If directed, apply heat to the affected area as often as told by your health care provider. Use the heat source that your health care provider recommends, such as a moist heat pack or a heating pad. ? If you have a removable assistive device, remove it as told by your health care provider. ? Place a towel between your skin and the heat source. If your health care provider tells you to keep the assistive device on while you apply heat, place a towel between the assistive device and the heat source. ? Leave the heat on for 20-30 minutes. ? Remove the heat if your skin turns bright red. This is especially important if you are unable to feel pain, heat, or cold. You may have a greater risk of getting burned.  If directed, put ice on the affected joint: ? If you have a removable assistive device, remove it as told by your health care provider. ? Put ice in a plastic bag. ? Place a towel between your skin and the bag. If your health care provider tells you to keep the assistive device on during icing, place a towel between the assistive device and the bag. ? Leave the ice on for 20 minutes, 2-3 times a day. General instructions  Take over-the-counter and prescription medicines only as told by your health care provider.  Maintain a healthy weight. Follow instructions from your health care provider for weight control. These may include dietary restrictions.  Do not use any products that contain nicotine or tobacco, such as cigarettes and e-cigarettes. These can delay bone healing. If you need help quitting, ask your health care provider.  Use assistive devices  as directed by your health care provider.  Keep all follow-up visits as told by your health care provider. This is important. Where to find more information:  [employer] of Arthritis and Musculoskeletal and Skin Diseases: www.niams.SouthExposed.es  [employer] on Aging: http://kim-miller.com/  American College of Rheumatology: www.rheumatology.org Contact a health care provider if:  Your skin turns red.  You develop a rash.  You have pain that gets worse.  You have a fever along with joint or muscle aches. Get help right away if:  You lose a lot of weight.  You suddenly lose your appetite.  You have night sweats. Summary  Osteoarthritis is a type of arthritis that affects tissue covering the ends of bones in joints (cartilage).  This condition is caused by age-related wearing down of cartilage that covers the ends of bones.  The main symptom of this condition is pain, swelling, and stiffness in the joint.  There is no cure for this condition, but treatment can help to control pain and improve joint function. This information is not intended to replace advice given to you by your health care provider. Make sure you discuss any questions you have with your health  care provider. Document Released: 10/05/2005 Document Revised: 06/08/2016 Document Reviewed: 06/08/2016 Elsevier Interactive Patient Education  [employer].

## 2018-06-24 ENCOUNTER — Encounter: Payer: Self-pay | Admitting: Family Medicine

## 2018-06-24 MED ORDER — HYDROCODONE-ACETAMINOPHEN 5-325 MG PO TABS: 1.0000 | ORAL_TABLET | Freq: Four times a day (QID) | ORAL | 0 refills | Status: DC | PRN

## 2018-06-24 MED ORDER — HYDROCODONE-ACETAMINOPHEN 7.5-325 MG PO TABS: 1.0000 | ORAL_TABLET | Freq: Four times a day (QID) | ORAL | 0 refills | Status: DC | PRN

## 2018-06-24 NOTE — Progress Notes (Signed)
Subjective:    Patient ID: [name], female    DOB: [birth year], 67 y.o.   MRN: [id number] [name] is alone Sources of clinical information for visit is/are patient and past medical records. Nursing assessment for this office visit was reviewed with the patient for accuracy and revision.  Previous Report(s) Reviewed: historical medical records and viewed recent lumbar spine xrays  Depression screen PHQ 2/9 06/23/2018  Decreased Interest 0  Down, Depressed, Hopeless 0  PHQ - 2 Score 0   Fall Risk  06/23/2018 06/09/2018 02/10/2018 09/16/2017 04/01/2017  Falls in the past year? Yes Yes No No No  Number falls in past yr: 2 or more 2 or more - - -  Injury with Fall? No No - - -    HPI  CHRONIC DIABETES No chest pain No claudication No limb weakness nor numbness Disease Monitoring  Blood Sugar Ranges: 100-200  Polyuria: no   Visual problems: no   Medication Compliance: no, stopped Jardiance 10 mg daily because of dizziness/nearsyncope with standing.  Increased her metformin to 2000 mg daily  Medication Side Effects  Hypoglycemia: no    Back Pain - Seen in ov 06/09/18 - Low back midline pain with rad bilateral thighs - No red flags - DG Lumbar spine XR showed progression of DDD since 2011.  - Gabapentin 100 mg TID prn helping with pain, taking once or twice a day.  Finds it makes her sleepy.  Not using when driving.  ADHD - Longstanding problem - Taking Ritalin without adverse effect.  Believes it helps her focus and complete tasks.  - no abnormal movements, no weight loss, no loss of appetite, no headaches CHRONIC HYPERTENSION  Disease Monitoring  Blood pressure range: not checking at home  Chest pain: no   Dyspnea: no   Claudication: no   Medication compliance: yes  Medication Side Effects  Lightheadedness: yes, but stopped with stopping Jardiance   Urinary frequency: no   Edema: no    Preventitive Healthcare:  Exercise: no   Diet Pattern: fair  Salt  Restriction: no     SH: No smoking Review of Systems See HPI    Objective:   Physical Exam VS reviewed GEN: Alert, Cooperative, Groomed, NAD Gait: Normal speed, No significant path deviation, Step through +,  Psych: Normal affect/thought/speech/language    Assessment & Plan:  Visit Problem List with A/P  Diabetes mellitus type 2, uncomplicated (HCC) Lab Results  Component Value Date   HGBA1C 8.2 (A) 06/09/2018   Intolerant of Jardiance, even at lowest 10 mg daily dose. Pt increased her metformin to 2000 mg daily, and may increase to 2500 mg daily total if home CBGs do not improve. Pt wants to work on improving her exercise and diet.  Goal A1c < 8.0.  RTC 3 months recheck Needs Ophth exam scheduled next ov   Attention deficit disorder Established problem. Stable. Continue current therapy Rx for Ritalin printed to take to [facility] [place] [facility]  Hypertension associated with diabetes ([place]) Established problem. Stable. Continue current therapy   Lumbar back pain with radiculopathy affecting right lower extremity Established problem that has improved.  Reviewed DG XR Lumbar Spine with patient showing worsening DDD Continue gabapentin 100 mg TID prn as it seems to be allowing patient to complete her daily household and family tasks.  SPONDYLOSIS, LUMBAR Established problem worsened DDD by XR compared to 2011 XR  Continue gabapentin Patient to start outpatient PT to help with soft  tissue release, pain control, and HEP

## 2018-06-24 NOTE — Assessment & Plan Note (Signed)
Established problem worsened DDD by XR compared to 2011 XR  Continue gabapentin Patient to start outpatient PT to help with soft tissue release, pain control, and HEP

## 2018-06-24 NOTE — Assessment & Plan Note (Signed)
Established problem. Stable. Continue current therapy  

## 2018-06-24 NOTE — Assessment & Plan Note (Signed)
Established problem that has improved.  Reviewed DG XR Lumbar Spine with patient showing worsening DDD Continue gabapentin 100 mg TID prn as it seems to be allowing patient to complete her daily household and family tasks.

## 2018-06-24 NOTE — Assessment & Plan Note (Addendum)
Lab Results  Component Value Date   HGBA1C 8.2 (A) 06/09/2018   Intolerant of Jardiance, even at lowest 10 mg daily dose. Pt increased her metformin to 2000 mg daily, and may increase to 2500 mg daily total if home CBGs do not improve. Pt wants to work on improving her exercise and diet.  Goal A1c < 8.0.  RTC 3 months recheck Needs Ophth exam scheduled next ov

## 2018-06-24 NOTE — Assessment & Plan Note (Signed)
Established problem. Stable. Continue current therapy Rx for Ritalin printed to take to [facility] [place] pharmacy

## 2018-06-29 ENCOUNTER — Ambulatory Visit: Payer: Medicare Other

## 2018-07-18 ENCOUNTER — Telehealth: Payer: Self-pay | Admitting: *Deleted

## 2018-07-18 DIAGNOSIS — M159 Polyosteoarthritis, unspecified: Secondary | ICD-10-CM

## 2018-07-18 DIAGNOSIS — M150 Primary generalized (osteo)arthritis: Secondary | ICD-10-CM

## 2018-07-18 DIAGNOSIS — M180 Bilateral primary osteoarthritis of first carpometacarpal joints: Secondary | ICD-10-CM

## 2018-07-18 DIAGNOSIS — M5416 Radiculopathy, lumbar region: Secondary | ICD-10-CM

## 2018-07-18 DIAGNOSIS — M5442 Lumbago with sciatica, left side: Secondary | ICD-10-CM

## 2018-07-18 DIAGNOSIS — M47817 Spondylosis without myelopathy or radiculopathy, lumbosacral region: Secondary | ICD-10-CM

## 2018-07-18 DIAGNOSIS — M5441 Lumbago with sciatica, right side: Secondary | ICD-10-CM

## 2018-07-18 DIAGNOSIS — Q762 Congenital spondylolisthesis: Secondary | ICD-10-CM

## 2018-07-18 NOTE — Telephone Encounter (Signed)
[facility] needs permission to fill Norco before the date of 06/24/18 on Rx.  Last pickup was 06/09/18. Serrena Linderman, Salome Spotted, CMA

## 2018-07-19 NOTE — Telephone Encounter (Signed)
2nd request

## 2018-07-19 NOTE — Telephone Encounter (Signed)
Patient had 2 Rx written on 9/6 to be filled 30 and 60 days from write date.     Please let her know I can not give permission to refill early unless patient has an acute new painful condition and she would need to be seen to be evaluated.  Thanks  [place]

## 2018-07-20 NOTE — Telephone Encounter (Signed)
Pt left VM on nurse line stating she was able to pick up her pain prescription, however it was written for the wrong strength. Rx was suppose to be written for 7.5/325mg , instead she got 5/325mg . Per chambliss last note he didn't ok for this to be filled. Pt would like someone to call the pharmacy to have this straightened out.

## 2018-07-20 NOTE — Telephone Encounter (Signed)
Spoke with pharmacy and since patient has already picked up the 5-325mg  dose she has 2 options:  1-patient can take 1.5 tablets and just an increased dose of acetaminophen OR we can send in a new script to reflect the correct dosing of 7.5-325mg  norco and have patient discard the other medication here in our office.  Will forward to Dr. Erin Hearing who is covering for Dr. McDiarmid while he is out of the office. Jazmin Hartsell,CMA

## 2018-07-20 NOTE — Telephone Encounter (Signed)
Please ask patient to take 1.5 tabs (to equal a total 7.5mg )  instead of one tab of 7.5mg  and NOT to take any tylenol tabs   Dr McDairrmid can review her dosing and prescribe more as he determines

## 2018-07-21 NOTE — Telephone Encounter (Signed)
Gave pt the information below. Pt has good understanding. Pt is concerned that she will run out of medication in 15 days and doesn't want to go without. Pt asked me to let Dr. McDiarmid know. Ottis Stain, CMA

## 2018-07-21 NOTE — Telephone Encounter (Signed)
LVM for patient to call back to discuss.

## 2018-07-25 MED ORDER — HYDROCODONE-ACETAMINOPHEN 7.5-325 MG PO TABS: 1.0000 | ORAL_TABLET | Freq: Three times a day (TID) | ORAL | 0 refills | Status: DC | PRN

## 2018-07-25 NOTE — Telephone Encounter (Signed)
Rx to fill now for hydrocodone-acetaminophen 7.5-325 mg every 8 hours prn chronic non-malignant pain.

## 2018-07-25 NOTE — Addendum Note (Signed)
Addended byWendy Poet, Memorie Yokoyama D on: 07/25/2018 09:06 AM   Modules accepted: Orders

## 2018-08-05 ENCOUNTER — Other Ambulatory Visit: Payer: Self-pay | Admitting: Family Medicine

## 2018-09-07 ENCOUNTER — Ambulatory Visit: Payer: Medicare Other

## 2018-09-13 ENCOUNTER — Other Ambulatory Visit: Payer: Self-pay | Admitting: Family Medicine

## 2018-09-13 DIAGNOSIS — J0191 Acute recurrent sinusitis, unspecified: Secondary | ICD-10-CM

## 2018-09-26 ENCOUNTER — Other Ambulatory Visit: Payer: Self-pay | Admitting: *Deleted

## 2018-09-26 DIAGNOSIS — M180 Bilateral primary osteoarthritis of first carpometacarpal joints: Secondary | ICD-10-CM

## 2018-09-26 DIAGNOSIS — M5416 Radiculopathy, lumbar region: Secondary | ICD-10-CM

## 2018-09-26 DIAGNOSIS — E7800 Pure hypercholesterolemia, unspecified: Secondary | ICD-10-CM

## 2018-09-26 DIAGNOSIS — M150 Primary generalized (osteo)arthritis: Secondary | ICD-10-CM

## 2018-09-26 DIAGNOSIS — J309 Allergic rhinitis, unspecified: Secondary | ICD-10-CM

## 2018-09-26 DIAGNOSIS — M159 Polyosteoarthritis, unspecified: Secondary | ICD-10-CM

## 2018-09-26 DIAGNOSIS — E119 Type 2 diabetes mellitus without complications: Secondary | ICD-10-CM

## 2018-09-26 DIAGNOSIS — M47817 Spondylosis without myelopathy or radiculopathy, lumbosacral region: Secondary | ICD-10-CM

## 2018-09-26 DIAGNOSIS — I10 Essential (primary) hypertension: Secondary | ICD-10-CM

## 2018-09-26 DIAGNOSIS — Q762 Congenital spondylolisthesis: Secondary | ICD-10-CM

## 2018-09-26 DIAGNOSIS — F988 Other specified behavioral and emotional disorders with onset usually occurring in childhood and adolescence: Secondary | ICD-10-CM

## 2018-09-26 MED ORDER — METHYLPHENIDATE HCL 10 MG PO TABS: 20.0000 mg | ORAL_TABLET | Freq: Three times a day (TID) | ORAL | 0 refills | Status: DC

## 2018-09-26 MED ORDER — NAPROXEN 500 MG PO TABS: 500.0000 mg | ORAL_TABLET | Freq: Two times a day (BID) | ORAL | 3 refills | Status: DC

## 2018-09-26 MED ORDER — AMLODIPINE BESY-BENAZEPRIL HCL 10-20 MG PO CAPS: 1.0000 | ORAL_CAPSULE | Freq: Every day | ORAL | 3 refills | Status: DC

## 2018-09-26 MED ORDER — METFORMIN HCL ER 500 MG PO TB24: ORAL_TABLET | ORAL | 3 refills | Status: DC

## 2018-09-26 MED ORDER — METHOCARBAMOL 750 MG PO TABS: ORAL_TABLET | ORAL | 5 refills | Status: DC

## 2018-09-26 MED ORDER — ALBUTEROL SULFATE HFA 108 (90 BASE) MCG/ACT IN AERS: 2.0000 | INHALATION_SPRAY | Freq: Four times a day (QID) | RESPIRATORY_TRACT | 3 refills | Status: DC | PRN

## 2018-09-26 MED ORDER — HYDROCODONE-ACETAMINOPHEN 7.5-325 MG PO TABS: 1.0000 | ORAL_TABLET | Freq: Three times a day (TID) | ORAL | 0 refills | Status: DC | PRN

## 2018-09-26 MED ORDER — FLUTICASONE PROPIONATE 50 MCG/ACT NA SUSP: 2.0000 | Freq: Every day | NASAL | 3 refills | Status: DC

## 2018-09-26 MED ORDER — GABAPENTIN 100 MG PO CAPS: 100.0000 mg | ORAL_CAPSULE | Freq: Three times a day (TID) | ORAL | 3 refills | Status: DC | PRN

## 2018-09-26 NOTE — Telephone Encounter (Signed)
Pt needs hydrocodone and methocarbamol sent to [facility].  The others need to be printed so that she can take them to [place]. Michaeljohn Biss, Salome Spotted, CMA

## 2018-10-04 ENCOUNTER — Telehealth: Payer: Self-pay | Admitting: *Deleted

## 2018-10-04 NOTE — Telephone Encounter (Signed)
Completed PA info in [place] for hydrocodone.  Status pending.  Will recheck status in 24 hours. Nakai Yard, Salome Spotted, CMA

## 2018-10-04 NOTE — Telephone Encounter (Signed)
Prior approval for Hydrocodone completed via [place] [employer].  Med approved for 10/04/2018 - 04/02/2019 Prior approval # [id number].  [place] pharmacy informed.  Fle[place], Salome Spotted, CMA

## 2018-10-23 ENCOUNTER — Other Ambulatory Visit: Payer: Self-pay | Admitting: Family Medicine

## 2018-10-23 DIAGNOSIS — M5416 Radiculopathy, lumbar region: Secondary | ICD-10-CM

## 2018-11-02 ENCOUNTER — Other Ambulatory Visit: Payer: Self-pay | Admitting: Family Medicine

## 2018-11-02 DIAGNOSIS — E119 Type 2 diabetes mellitus without complications: Secondary | ICD-10-CM

## 2018-11-02 DIAGNOSIS — M5416 Radiculopathy, lumbar region: Secondary | ICD-10-CM

## 2018-11-02 DIAGNOSIS — J4520 Mild intermittent asthma, uncomplicated: Secondary | ICD-10-CM

## 2018-11-02 DIAGNOSIS — I10 Essential (primary) hypertension: Secondary | ICD-10-CM

## 2018-11-02 DIAGNOSIS — F988 Other specified behavioral and emotional disorders with onset usually occurring in childhood and adolescence: Secondary | ICD-10-CM

## 2018-11-02 DIAGNOSIS — M47817 Spondylosis without myelopathy or radiculopathy, lumbosacral region: Secondary | ICD-10-CM

## 2018-11-02 DIAGNOSIS — Q762 Congenital spondylolisthesis: Secondary | ICD-10-CM

## 2018-11-02 DIAGNOSIS — J309 Allergic rhinitis, unspecified: Secondary | ICD-10-CM

## 2018-11-02 HISTORY — DX: Mild intermittent asthma, uncomplicated: J45.20

## 2018-11-02 MED ORDER — METFORMIN HCL ER 500 MG PO TB24: ORAL_TABLET | ORAL | 3 refills | Status: DC

## 2018-11-02 MED ORDER — METHYLPHENIDATE HCL 10 MG PO TABS: 20.0000 mg | ORAL_TABLET | Freq: Three times a day (TID) | ORAL | 0 refills | Status: DC

## 2018-11-02 MED ORDER — ALBUTEROL SULFATE HFA 108 (90 BASE) MCG/ACT IN AERS: 2.0000 | INHALATION_SPRAY | Freq: Four times a day (QID) | RESPIRATORY_TRACT | 3 refills | Status: DC | PRN

## 2018-11-02 MED ORDER — GABAPENTIN 100 MG PO CAPS: ORAL_CAPSULE | ORAL | 3 refills | Status: DC

## 2018-11-02 MED ORDER — NAPROXEN 500 MG PO TABS: 500.0000 mg | ORAL_TABLET | Freq: Two times a day (BID) | ORAL | 3 refills | Status: DC

## 2018-11-02 MED ORDER — AMLODIPINE BESY-BENAZEPRIL HCL 10-20 MG PO CAPS: 1.0000 | ORAL_CAPSULE | Freq: Every day | ORAL | 3 refills | Status: DC

## 2018-11-02 MED ORDER — FLUTICASONE PROPIONATE 50 MCG/ACT NA SUSP: 2.0000 | Freq: Every day | NASAL | 99 refills | Status: DC

## 2018-12-02 ENCOUNTER — Other Ambulatory Visit: Payer: Self-pay | Admitting: Family Medicine

## 2018-12-02 DIAGNOSIS — J0191 Acute recurrent sinusitis, unspecified: Secondary | ICD-10-CM

## 2018-12-15 ENCOUNTER — Other Ambulatory Visit: Payer: Self-pay | Admitting: *Deleted

## 2018-12-15 DIAGNOSIS — M180 Bilateral primary osteoarthritis of first carpometacarpal joints: Secondary | ICD-10-CM

## 2018-12-15 DIAGNOSIS — F988 Other specified behavioral and emotional disorders with onset usually occurring in childhood and adolescence: Secondary | ICD-10-CM

## 2018-12-15 DIAGNOSIS — Q762 Congenital spondylolisthesis: Secondary | ICD-10-CM

## 2018-12-15 DIAGNOSIS — M150 Primary generalized (osteo)arthritis: Secondary | ICD-10-CM

## 2018-12-15 DIAGNOSIS — M159 Polyosteoarthritis, unspecified: Secondary | ICD-10-CM

## 2018-12-15 DIAGNOSIS — M47817 Spondylosis without myelopathy or radiculopathy, lumbosacral region: Secondary | ICD-10-CM

## 2018-12-15 DIAGNOSIS — M5416 Radiculopathy, lumbar region: Secondary | ICD-10-CM

## 2018-12-16 MED ORDER — HYDROCODONE-ACETAMINOPHEN 7.5-325 MG PO TABS: 1.0000 | ORAL_TABLET | Freq: Three times a day (TID) | ORAL | 0 refills | Status: DC | PRN

## 2018-12-16 MED ORDER — METHYLPHENIDATE HCL 10 MG PO TABS: 20.0000 mg | ORAL_TABLET | Freq: Three times a day (TID) | ORAL | 0 refills | Status: DC

## 2019-01-05 ENCOUNTER — Other Ambulatory Visit: Payer: Self-pay

## 2019-01-05 DIAGNOSIS — F988 Other specified behavioral and emotional disorders with onset usually occurring in childhood and adolescence: Secondary | ICD-10-CM

## 2019-01-05 MED ORDER — METHYLPHENIDATE HCL 10 MG PO TABS: 10.0000 mg | ORAL_TABLET | Freq: Two times a day (BID) | ORAL | 0 refills | Status: DC

## 2019-01-05 MED ORDER — METHYLPHENIDATE HCL 10 MG PO TABS: 20.0000 mg | ORAL_TABLET | Freq: Three times a day (TID) | ORAL | 0 refills | Status: DC

## 2019-01-13 ENCOUNTER — Telehealth: Payer: Self-pay

## 2019-01-13 DIAGNOSIS — F988 Other specified behavioral and emotional disorders with onset usually occurring in childhood and adolescence: Secondary | ICD-10-CM

## 2019-01-13 NOTE — Telephone Encounter (Signed)
Pt LVM on nurse line stating two of her three Ritalin prescriptions were written wrong. Pt stated two of them had a sig of "2x daily" when they need to be written for "3x daily." Please advise.

## 2019-01-16 MED ORDER — METHYLPHENIDATE HCL 10 MG PO TABS: 20.0000 mg | ORAL_TABLET | Freq: Three times a day (TID) | ORAL | 0 refills | Status: DC

## 2019-01-16 NOTE — Telephone Encounter (Signed)
I contacted pharmacy to cancel the two ritalin with incorrect sigs: 2x10mg BID dated fills 30 days from 01/05/19 and 60 days from 01/05/19.  I am sending in two monthly ritalin prescriptions that are for 2x10mg tabTID. 180 tab/month. Fills on 02/04/19 and on 03/06/19.  Check of [place] CSRS db shows no aberrant behavior.

## 2019-03-02 ENCOUNTER — Other Ambulatory Visit: Payer: Self-pay | Admitting: *Deleted

## 2019-03-02 DIAGNOSIS — Q762 Congenital spondylolisthesis: Secondary | ICD-10-CM

## 2019-03-02 DIAGNOSIS — M5416 Radiculopathy, lumbar region: Secondary | ICD-10-CM

## 2019-03-02 DIAGNOSIS — M159 Polyosteoarthritis, unspecified: Secondary | ICD-10-CM

## 2019-03-02 DIAGNOSIS — F988 Other specified behavioral and emotional disorders with onset usually occurring in childhood and adolescence: Secondary | ICD-10-CM

## 2019-03-02 DIAGNOSIS — M180 Bilateral primary osteoarthritis of first carpometacarpal joints: Secondary | ICD-10-CM

## 2019-03-02 DIAGNOSIS — M47817 Spondylosis without myelopathy or radiculopathy, lumbosacral region: Secondary | ICD-10-CM

## 2019-03-02 DIAGNOSIS — E119 Type 2 diabetes mellitus without complications: Secondary | ICD-10-CM
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
## 2019-03-03 ENCOUNTER — Other Ambulatory Visit: Payer: Self-pay | Admitting: Family Medicine

## 2019-03-03 DIAGNOSIS — M47817 Spondylosis without myelopathy or radiculopathy, lumbosacral region: Secondary | ICD-10-CM

## 2019-03-03 DIAGNOSIS — Q762 Congenital spondylolisthesis: Secondary | ICD-10-CM

## 2019-03-03 MED ORDER — METHOCARBAMOL 750 MG PO TABS: ORAL_TABLET | ORAL | 5 refills | Status: DC

## 2019-03-03 MED ORDER — METHYLPHENIDATE HCL 10 MG PO TABS: 20.0000 mg | ORAL_TABLET | Freq: Three times a day (TID) | ORAL | 0 refills | Status: DC

## 2019-03-03 MED ORDER — HYDROCODONE-ACETAMINOPHEN 7.5-325 MG PO TABS: 1.0000 | ORAL_TABLET | Freq: Three times a day (TID) | ORAL | 0 refills | Status: DC | PRN

## 2019-03-03 MED ORDER — METFORMIN HCL ER 500 MG PO TB24: 1000.0000 mg | ORAL_TABLET | Freq: Every day | ORAL | 3 refills | Status: DC

## 2019-04-10 ENCOUNTER — Other Ambulatory Visit: Payer: Self-pay

## 2019-04-10 DIAGNOSIS — M159 Polyosteoarthritis, unspecified: Secondary | ICD-10-CM

## 2019-04-10 DIAGNOSIS — M47817 Spondylosis without myelopathy or radiculopathy, lumbosacral region: Secondary | ICD-10-CM

## 2019-04-10 DIAGNOSIS — M150 Primary generalized (osteo)arthritis: Secondary | ICD-10-CM

## 2019-04-10 DIAGNOSIS — Q762 Congenital spondylolisthesis: Secondary | ICD-10-CM

## 2019-04-10 DIAGNOSIS — M180 Bilateral primary osteoarthritis of first carpometacarpal joints: Secondary | ICD-10-CM

## 2019-04-10 DIAGNOSIS — M5416 Radiculopathy, lumbar region: Secondary | ICD-10-CM

## 2019-04-11 MED ORDER — HYDROCODONE-ACETAMINOPHEN 7.5-325 MG PO TABS: 1.0000 | ORAL_TABLET | Freq: Three times a day (TID) | ORAL | 0 refills | Status: DC | PRN

## 2019-05-01 ENCOUNTER — Other Ambulatory Visit: Payer: Self-pay | Admitting: Family Medicine

## 2019-05-01 DIAGNOSIS — M5416 Radiculopathy, lumbar region: Secondary | ICD-10-CM

## 2019-05-02 ENCOUNTER — Other Ambulatory Visit: Payer: Self-pay | Admitting: *Deleted

## 2019-05-02 DIAGNOSIS — M5416 Radiculopathy, lumbar region: Secondary | ICD-10-CM

## 2019-05-02 DIAGNOSIS — F988 Other specified behavioral and emotional disorders with onset usually occurring in childhood and adolescence: Secondary | ICD-10-CM

## 2019-05-02 DIAGNOSIS — M47817 Spondylosis without myelopathy or radiculopathy, lumbosacral region: Secondary | ICD-10-CM

## 2019-05-02 DIAGNOSIS — M159 Polyosteoarthritis, unspecified: Secondary | ICD-10-CM

## 2019-05-02 DIAGNOSIS — M180 Bilateral primary osteoarthritis of first carpometacarpal joints: Secondary | ICD-10-CM

## 2019-05-02 DIAGNOSIS — Q762 Congenital spondylolisthesis: Secondary | ICD-10-CM

## 2019-05-03 MED ORDER — HYDROCODONE-ACETAMINOPHEN 7.5-325 MG PO TABS: 1.0000 | ORAL_TABLET | Freq: Three times a day (TID) | ORAL | 0 refills | Status: DC | PRN

## 2019-05-03 MED ORDER — METHYLPHENIDATE HCL 10 MG PO TABS: 20.0000 mg | ORAL_TABLET | Freq: Three times a day (TID) | ORAL | 0 refills | Status: DC

## 2019-05-11 ENCOUNTER — Ambulatory Visit (INDEPENDENT_AMBULATORY_CARE_PROVIDER_SITE_OTHER): Payer: Medicare Other | Admitting: Family Medicine

## 2019-05-11 ENCOUNTER — Other Ambulatory Visit: Payer: Self-pay

## 2019-05-11 ENCOUNTER — Encounter: Payer: Self-pay | Admitting: Family Medicine

## 2019-05-11 VITALS — BP 160/90 | HR 109 | Wt 177.6 lb

## 2019-05-11 DIAGNOSIS — Z79899 Other long term (current) drug therapy: Secondary | ICD-10-CM | POA: Diagnosis not present

## 2019-05-11 DIAGNOSIS — Z1231 Encounter for screening mammogram for malignant neoplasm of breast: Secondary | ICD-10-CM

## 2019-05-11 DIAGNOSIS — I10 Essential (primary) hypertension: Secondary | ICD-10-CM

## 2019-05-11 DIAGNOSIS — Q762 Congenital spondylolisthesis: Secondary | ICD-10-CM

## 2019-05-11 DIAGNOSIS — J4520 Mild intermittent asthma, uncomplicated: Secondary | ICD-10-CM

## 2019-05-11 DIAGNOSIS — M47817 Spondylosis without myelopathy or radiculopathy, lumbosacral region: Secondary | ICD-10-CM

## 2019-05-11 DIAGNOSIS — F988 Other specified behavioral and emotional disorders with onset usually occurring in childhood and adolescence: Secondary | ICD-10-CM

## 2019-05-11 DIAGNOSIS — J309 Allergic rhinitis, unspecified: Secondary | ICD-10-CM | POA: Diagnosis not present

## 2019-05-11 DIAGNOSIS — M5416 Radiculopathy, lumbar region: Secondary | ICD-10-CM

## 2019-05-11 DIAGNOSIS — E119 Type 2 diabetes mellitus without complications: Secondary | ICD-10-CM

## 2019-05-11 DIAGNOSIS — E7800 Pure hypercholesterolemia, unspecified: Secondary | ICD-10-CM | POA: Diagnosis not present

## 2019-05-11 LAB — POCT GLYCOSYLATED HEMOGLOBIN (HGB A1C): HbA1c, POC (controlled diabetic range): 12.5 % — AB (ref 0.0–7.0)

## 2019-05-11 MED ORDER — METFORMIN HCL ER 500 MG PO TB24: 500.0000 mg | ORAL_TABLET | Freq: Two times a day (BID) | ORAL | 3 refills | Status: DC

## 2019-05-11 MED ORDER — ATORVASTATIN CALCIUM 40 MG PO TABS: 40.0000 mg | ORAL_TABLET | Freq: Every day | ORAL | 3 refills | Status: AC

## 2019-05-11 MED ORDER — GABAPENTIN 100 MG PO CAPS: ORAL_CAPSULE | ORAL | 3 refills | Status: DC

## 2019-05-11 MED ORDER — VITAMIN B-12 1000 MCG PO TABS: 1000.0000 ug | ORAL_TABLET | Freq: Every day | ORAL | 3 refills | Status: AC

## 2019-05-11 MED ORDER — NAPROXEN 500 MG PO TABS: 500.0000 mg | ORAL_TABLET | Freq: Two times a day (BID) | ORAL | 3 refills | Status: DC

## 2019-05-11 MED ORDER — FLUTICASONE PROPIONATE 50 MCG/ACT NA SUSP: 2.0000 | Freq: Every day | NASAL | 99 refills | Status: DC

## 2019-05-11 MED ORDER — ALBUTEROL SULFATE HFA 108 (90 BASE) MCG/ACT IN AERS: 2.0000 | INHALATION_SPRAY | Freq: Four times a day (QID) | RESPIRATORY_TRACT | 5 refills | Status: DC | PRN

## 2019-05-11 MED ORDER — OMEPRAZOLE 20 MG PO CPDR: DELAYED_RELEASE_CAPSULE | ORAL | 3 refills | Status: DC

## 2019-05-11 MED ORDER — AMLODIPINE BESY-BENAZEPRIL HCL 10-20 MG PO CAPS: 1.0000 | ORAL_CAPSULE | Freq: Every day | ORAL | 3 refills | Status: DC

## 2019-05-11 MED ORDER — METHYLPHENIDATE HCL 10 MG PO TABS: 20.0000 mg | ORAL_TABLET | Freq: Three times a day (TID) | ORAL | 0 refills | Status: DC

## 2019-05-11 NOTE — Patient Instructions (Signed)
Your A1c is high.  Restart your metformin 2000 mg a day.  Restart your other medications.   Dr McDiarmid would like to see you again in 3 months.

## 2019-05-12 LAB — BASIC METABOLIC PANEL
BUN/Creatinine Ratio: 16 (ref 12–28)
BUN: 12 mg/dL (ref 8–27)
CO2: 23 mmol/L (ref 20–29)
Calcium: 10.1 mg/dL (ref 8.7–10.3)
Chloride: 96 mmol/L (ref 96–106)
Creatinine, Ser: 0.74 mg/dL (ref 0.57–1.00)
GFR calc Af Amer: 96 mL/min/{1.73_m2} (ref >59)
GFR calc non Af Amer: 83 mL/min/{1.73_m2} (ref >59)
Glucose: 336 mg/dL — ABNORMAL HIGH (ref 65–99)
Potassium: 4.6 mmol/L (ref 3.5–5.2)
Sodium: 136 mmol/L (ref 134–144)

## 2019-05-15 ENCOUNTER — Encounter: Payer: Self-pay | Admitting: Family Medicine
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
## 2019-07-05 ENCOUNTER — Ambulatory Visit (INDEPENDENT_AMBULATORY_CARE_PROVIDER_SITE_OTHER): Payer: Medicare Other | Admitting: *Deleted

## 2019-07-05 ENCOUNTER — Other Ambulatory Visit: Payer: Self-pay

## 2019-07-05 DIAGNOSIS — Z23 Encounter for immunization: Secondary | ICD-10-CM | POA: Diagnosis not present

## 2019-07-10 ENCOUNTER — Other Ambulatory Visit: Payer: Self-pay | Admitting: *Deleted

## 2019-07-10 DIAGNOSIS — F988 Other specified behavioral and emotional disorders with onset usually occurring in childhood and adolescence: Secondary | ICD-10-CM

## 2019-07-10 MED ORDER — METHYLPHENIDATE HCL 10 MG PO TABS: 20.0000 mg | ORAL_TABLET | Freq: Three times a day (TID) | ORAL | 0 refills | Status: DC

## 2019-07-20 ENCOUNTER — Other Ambulatory Visit: Payer: Self-pay

## 2019-07-20 ENCOUNTER — Ambulatory Visit (INDEPENDENT_AMBULATORY_CARE_PROVIDER_SITE_OTHER): Payer: Medicare Other | Admitting: Family Medicine

## 2019-07-20 ENCOUNTER — Encounter: Payer: Self-pay | Admitting: Family Medicine

## 2019-07-20 VITALS — BP 134/72 | HR 100

## 2019-07-20 DIAGNOSIS — M7062 Trochanteric bursitis, left hip: Secondary | ICD-10-CM

## 2019-07-20 DIAGNOSIS — M5416 Radiculopathy, lumbar region: Secondary | ICD-10-CM | POA: Diagnosis not present

## 2019-07-20 DIAGNOSIS — Q762 Congenital spondylolisthesis: Secondary | ICD-10-CM

## 2019-07-20 DIAGNOSIS — M180 Bilateral primary osteoarthritis of first carpometacarpal joints: Secondary | ICD-10-CM

## 2019-07-20 DIAGNOSIS — M47817 Spondylosis without myelopathy or radiculopathy, lumbosacral region: Secondary | ICD-10-CM

## 2019-07-20 DIAGNOSIS — I10 Essential (primary) hypertension: Secondary | ICD-10-CM | POA: Diagnosis not present

## 2019-07-20 DIAGNOSIS — M8949 Other hypertrophic osteoarthropathy, multiple sites: Secondary | ICD-10-CM

## 2019-07-20 DIAGNOSIS — E1159 Type 2 diabetes mellitus with other circulatory complications: Secondary | ICD-10-CM | POA: Diagnosis not present

## 2019-07-20 DIAGNOSIS — E119 Type 2 diabetes mellitus without complications: Secondary | ICD-10-CM

## 2019-07-20 DIAGNOSIS — Z79891 Long term (current) use of opiate analgesic: Secondary | ICD-10-CM | POA: Diagnosis not present

## 2019-07-20 DIAGNOSIS — E1142 Type 2 diabetes mellitus with diabetic polyneuropathy: Secondary | ICD-10-CM | POA: Diagnosis not present

## 2019-07-20 DIAGNOSIS — M150 Primary generalized (osteo)arthritis: Secondary | ICD-10-CM

## 2019-07-20 DIAGNOSIS — I152 Hypertension secondary to endocrine disorders: Secondary | ICD-10-CM

## 2019-07-20 DIAGNOSIS — M159 Polyosteoarthritis, unspecified: Secondary | ICD-10-CM

## 2019-07-20 LAB — POCT GLYCOSYLATED HEMOGLOBIN (HGB A1C): HbA1c, POC (controlled diabetic range): 7.7 % — AB (ref 0.0–7.0)

## 2019-07-20 MED ORDER — HYDROCODONE-ACETAMINOPHEN 7.5-325 MG PO TABS: 1.0000 | ORAL_TABLET | Freq: Three times a day (TID) | ORAL | 0 refills | Status: DC | PRN

## 2019-07-20 MED ORDER — METHYLPREDNISOLONE ACETATE 40 MG/ML IJ SUSP
40.0000 mg | Freq: Once | INTRAMUSCULAR | Status: AC
  Administered 2019-07-20: 12:00:00 40 mg via INTRA_ARTICULAR

## 2019-07-20 MED ORDER — METHYLPREDNISOLONE ACETATE 40 MG/ML IJ SUSP: 40.0000 mg | Freq: Once | INTRAMUSCULAR | Status: DC

## 2019-07-20 NOTE — Patient Instructions (Signed)
Your A1c was very good, down from 12.5 % in July to 7.7% today.   Injected steroid and numbing medication into your left hip bursitis.

## 2019-07-20 NOTE — Progress Notes (Signed)
depol

## 2019-07-24 ENCOUNTER — Encounter: Payer: Self-pay | Admitting: Family Medicine

## 2019-07-24 DIAGNOSIS — E1142 Type 2 diabetes mellitus with diabetic polyneuropathy: Secondary | ICD-10-CM | POA: Insufficient documentation

## 2019-07-24 NOTE — Assessment & Plan Note (Signed)
Established problem Controlled Continue current therapy regiment.  

## 2019-07-24 NOTE — Assessment & Plan Note (Signed)
Adequate blood pressure control.  No evidence of new end organ damage.  Tolerating medication without significant adverse effects.  Plan to continue current blood pressure regiment.   

## 2019-07-24 NOTE — Progress Notes (Signed)
   Subjective:    Patient ID: [name], female    DOB: 11-05-1950, 68 y.o.   MRN: [id number]  HPI HYPERTENSION Disease Monitoring: Blood pressure range-not measuring  Chest pain no palpitations- no       Dyspnea- no  Medications: Compliance- yes Lightheadedness: no Syncope- no    Edema- no  DIABETES Disease Monitoring: Blood Sugar ranges- not checking Polyuria/phagia/dipsia- no       Visual problems- no Medications: Compliance- No, taking Metformin XR 500 mg , one tablet twice a day instead of recommended 4 tablets daily.  Pt reports increase GI upset when increased to Metformin XR to three tablets a day   Hypoglycemic symptoms- no  HYPERLIPIDEMIA Disease Monitoring: See symptoms for Hypertension Medications: Compliance- Yes  Right upper quadrant pain- Yes   Muscle aches- Yes  Left Hip pain Onset: last month Location: lateral left hip Quality: ache Severity: mod Function: intereferes with sleep Duration: month Pattern: intermittent Course: worsening Radiation: no Relief: slight from APAP Precipitant: no  Trauma (Acute or Chronic): no Prior Diagnostic Testing or Treatments: hx of o injection many yrs ago   Monitoring Labs and Parameters Last A1C:  Lab Results  Component Value Date   HGBA1C 7.7 (A) 07/20/2019    Last Lipid:     Component Value Date/Time   CHOL 196 08/29/2015 1240   HDL 66 08/29/2015 1240   LDLDIRECT 121 09/17/2016 1149    Last Bmet  Potassium  Date Value Ref Range Status  05/11/2019 4.6 3.5 - 5.2 mmol/L Final   Sodium  Date Value Ref Range Status  05/11/2019 136 134 - 144 mmol/L Final   Creat  Date Value Ref Range Status  01/15/2016 0.73 0.50 - 0.99 mg/dL Final   Creatinine, Ser  Date Value Ref Range Status  05/11/2019 0.74 0.57 - 1.00 mg/dL Final     CrCl cannot be calculated (Patient's most recent lab result is older than the maximum 21 days allowed.).  Last BPs:  BP Readings from Last 3 Encounters:  07/20/19  134/72  05/11/19 (!) 160/90  06/23/18 (!) 144/84    SH: Smoking status reviewed  ROS: See HPI  Review of Systems     Objective:   Physical Exam        Assessment & Plan:

## 2019-07-24 NOTE — Assessment & Plan Note (Signed)
Established problem Controlled Continue current therapy regiment. Continue Naprosyn.  Check BMET Continue Norco 7.5mg .  Check of [place] 09/16/17 shoed no docotr shopping./  Given handwritten three Rxs for Norco 7.5/325 #90 now, in 30 days, and in 90 days.

## 2019-08-08 ENCOUNTER — Other Ambulatory Visit: Payer: Self-pay | Admitting: *Deleted

## 2019-08-08 DIAGNOSIS — F988 Other specified behavioral and emotional disorders with onset usually occurring in childhood and adolescence: Secondary | ICD-10-CM

## 2019-08-09 MED ORDER — METHYLPHENIDATE HCL 10 MG PO TABS: 20.0000 mg | ORAL_TABLET | Freq: Three times a day (TID) | ORAL | 0 refills | Status: DC

## 2019-09-11 ENCOUNTER — Other Ambulatory Visit: Payer: Self-pay

## 2019-09-11 DIAGNOSIS — F988 Other specified behavioral and emotional disorders with onset usually occurring in childhood and adolescence: Secondary | ICD-10-CM

## 2019-09-11 MED ORDER — METHYLPHENIDATE HCL 10 MG PO TABS: 20.0000 mg | ORAL_TABLET | Freq: Three times a day (TID) | ORAL | 0 refills | Status: DC

## 2019-11-09 ENCOUNTER — Other Ambulatory Visit: Payer: Self-pay

## 2019-11-09 ENCOUNTER — Encounter: Payer: Self-pay | Admitting: Family Medicine

## 2019-11-09 ENCOUNTER — Ambulatory Visit (INDEPENDENT_AMBULATORY_CARE_PROVIDER_SITE_OTHER): Payer: Medicare Other | Admitting: Family Medicine

## 2019-11-09 VITALS — BP 140/80 | HR 109 | Wt 183.0 lb

## 2019-11-09 DIAGNOSIS — E1159 Type 2 diabetes mellitus with other circulatory complications: Secondary | ICD-10-CM

## 2019-11-09 DIAGNOSIS — M8949 Other hypertrophic osteoarthropathy, multiple sites: Secondary | ICD-10-CM

## 2019-11-09 DIAGNOSIS — Q762 Congenital spondylolisthesis: Secondary | ICD-10-CM | POA: Diagnosis not present

## 2019-11-09 DIAGNOSIS — E1149 Type 2 diabetes mellitus with other diabetic neurological complication: Secondary | ICD-10-CM

## 2019-11-09 DIAGNOSIS — M5416 Radiculopathy, lumbar region: Secondary | ICD-10-CM | POA: Diagnosis not present

## 2019-11-09 DIAGNOSIS — M180 Bilateral primary osteoarthritis of first carpometacarpal joints: Secondary | ICD-10-CM

## 2019-11-09 DIAGNOSIS — M47817 Spondylosis without myelopathy or radiculopathy, lumbosacral region: Secondary | ICD-10-CM | POA: Diagnosis not present

## 2019-11-09 DIAGNOSIS — E1142 Type 2 diabetes mellitus with diabetic polyneuropathy: Secondary | ICD-10-CM

## 2019-11-09 DIAGNOSIS — I10 Essential (primary) hypertension: Secondary | ICD-10-CM | POA: Diagnosis not present

## 2019-11-09 DIAGNOSIS — E785 Hyperlipidemia, unspecified: Secondary | ICD-10-CM | POA: Diagnosis not present

## 2019-11-09 DIAGNOSIS — E1169 Type 2 diabetes mellitus with other specified complication: Secondary | ICD-10-CM | POA: Diagnosis not present

## 2019-11-09 DIAGNOSIS — F988 Other specified behavioral and emotional disorders with onset usually occurring in childhood and adolescence: Secondary | ICD-10-CM | POA: Diagnosis not present

## 2019-11-09 DIAGNOSIS — M159 Polyosteoarthritis, unspecified: Secondary | ICD-10-CM

## 2019-11-09 LAB — POCT GLYCOSYLATED HEMOGLOBIN (HGB A1C): HbA1c, POC (controlled diabetic range): 6.2 % (ref 0.0–7.0)

## 2019-11-09 MED ORDER — HYDROCODONE-ACETAMINOPHEN 7.5-325 MG PO TABS: 1.0000 | ORAL_TABLET | Freq: Three times a day (TID) | ORAL | 0 refills | Status: DC | PRN

## 2019-11-09 NOTE — Patient Instructions (Signed)
Your blood sugars are under great control.  Keep taking your metformin.  Your blood pressure is under good control.  Keep taking your blood pressure medicine.   We are checking your cholesterol today to see that your atorvastatin is working.

## 2019-11-10 DIAGNOSIS — E119 Type 2 diabetes mellitus without complications: Secondary | ICD-10-CM | POA: Diagnosis not present

## 2019-11-10 LAB — HM DIABETES EYE EXAM

## 2019-11-10 NOTE — Assessment & Plan Note (Signed)
Established problem Controlled Continue current therapy regiment.  

## 2019-11-10 NOTE — Assessment & Plan Note (Signed)
Established problem. °Helps patient sustain attention and complete tasks necessary for herself and her family °Stable. °Continue current therapy °PDMP db reviewed.  No aberrant behaviors noted.  °
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
## 2019-11-10 NOTE — Assessment & Plan Note (Addendum)
Established problem Lipid Panel     Component Value Date/Time   CHOL 201 (H) 11/09/2019 1146   TRIG 174 (H) 11/09/2019 1146   HDL 69 11/09/2019 1146   CHOLHDL 2.9 11/09/2019 1146   CHOLHDL 3.0 08/29/2015 1240   VLDL 33 (H) 08/29/2015 1240   LDLCALC 102 (H) 11/09/2019 1146   LDLDIRECT 121 09/17/2016 1149   LABVLDL 30 11/09/2019 1146  Acceptable LDL control  Continue atorvastain 40 daily for now

## 2019-11-10 NOTE — Progress Notes (Signed)
CPT E&M Office Visit Time Before Visit; reviewing medical records (e.g. recent visits, labs, studies): 10 minutes During Visit (F2F time): 10 minutes After Visit (discussion with family or HCP, prescribing, ordering, referring, calling result/recommendations or documenting on same day): 10 minutes Total Visit Time: 30 minutes  HYPERTENSION Disease Monitoring: Blood pressure range-not checking  Medications: Compliance- yes  No concerns  DIABETES Disease Monitoring: Blood Sugar ranges-"good" in hundreds  Medications: Compliance- yes  Hypoglycemic symptoms- no  No concerns HYPERLIPIDEMIA Disease Monitoring: Medications: Compliance- yes  No concerns  Monitoring Labs and Parameters Last A1C:  Lab Results  Component Value Date   HGBA1C 6.2 11/09/2019    Last Lipid:     Component Value Date/Time   CHOL 196 08/29/2015 1240   HDL 66 08/29/2015 1240   LDLDIRECT 121 09/17/2016 1149    Last Bmet  Potassium  Date Value Ref Range Status  05/11/2019 4.6 3.5 - 5.2 mmol/L Final   Sodium  Date Value Ref Range Status  05/11/2019 136 134 - 144 mmol/L Final   Creat  Date Value Ref Range Status  01/15/2016 0.73 0.50 - 0.99 mg/dL Final   Creatinine, Ser  Date Value Ref Range Status  05/11/2019 0.74 0.57 - 1.00 mg/dL Final     CrCl cannot be calculated (Patient's most recent lab result is older than the maximum 21 days allowed.).  Last BPs:  BP Readings from Last 3 Encounters:  11/09/19 140/80  07/20/19 134/72  05/11/19 (!) 160/90    SH: Smoking status reviewed  ROS: See HPI

## 2019-11-13 ENCOUNTER — Encounter: Payer: Self-pay | Admitting: Family Medicine

## 2019-11-13 LAB — LIPID PANEL
Chol/HDL Ratio: 2.9 ratio (ref 0.0–4.4)
Cholesterol, Total: 201 mg/dL — ABNORMAL HIGH (ref 100–199)
HDL: 69 mg/dL (ref >39)
LDL Chol Calc (NIH): 102 mg/dL — ABNORMAL HIGH (ref 0–99)
Triglycerides: 174 mg/dL — ABNORMAL HIGH (ref 0–149)
VLDL Cholesterol Cal: 30 mg/dL (ref 5–40)

## 2019-12-01 DIAGNOSIS — Z23 Encounter for immunization: Secondary | ICD-10-CM | POA: Diagnosis not present

## 2019-12-14 ENCOUNTER — Other Ambulatory Visit: Payer: Self-pay

## 2019-12-14 DIAGNOSIS — F988 Other specified behavioral and emotional disorders with onset usually occurring in childhood and adolescence: Secondary | ICD-10-CM

## 2019-12-14 NOTE — Telephone Encounter (Signed)
Pt calling to check on refill of Ritalin. Ottis Stain, CMA

## 2019-12-15 MED ORDER — METHYLPHENIDATE HCL 10 MG PO TABS: 20.0000 mg | ORAL_TABLET | Freq: Three times a day (TID) | ORAL | 0 refills | Status: DC

## 2019-12-29 DIAGNOSIS — Z23 Encounter for immunization: Secondary | ICD-10-CM | POA: Diagnosis not present

## 2020-01-16 ENCOUNTER — Other Ambulatory Visit: Payer: Self-pay

## 2020-01-16 DIAGNOSIS — F988 Other specified behavioral and emotional disorders with onset usually occurring in childhood and adolescence: Secondary | ICD-10-CM

## 2020-01-16 MED ORDER — METHYLPHENIDATE HCL 10 MG PO TABS: 20.0000 mg | ORAL_TABLET | Freq: Three times a day (TID) | ORAL | 0 refills | Status: DC

## 2020-02-20 ENCOUNTER — Other Ambulatory Visit: Payer: Self-pay

## 2020-02-20 DIAGNOSIS — M47817 Spondylosis without myelopathy or radiculopathy, lumbosacral region: Secondary | ICD-10-CM

## 2020-02-20 DIAGNOSIS — M159 Polyosteoarthritis, unspecified: Secondary | ICD-10-CM

## 2020-02-20 DIAGNOSIS — M180 Bilateral primary osteoarthritis of first carpometacarpal joints: Secondary | ICD-10-CM

## 2020-02-20 DIAGNOSIS — M5416 Radiculopathy, lumbar region: Secondary | ICD-10-CM

## 2020-02-20 DIAGNOSIS — Q762 Congenital spondylolisthesis: Secondary | ICD-10-CM

## 2020-02-20 DIAGNOSIS — F988 Other specified behavioral and emotional disorders with onset usually occurring in childhood and adolescence: Secondary | ICD-10-CM

## 2020-02-20 DIAGNOSIS — M150 Primary generalized (osteo)arthritis: Secondary | ICD-10-CM

## 2020-02-20 MED ORDER — HYDROCODONE-ACETAMINOPHEN 7.5-325 MG PO TABS: 1.0000 | ORAL_TABLET | Freq: Three times a day (TID) | ORAL | 0 refills | Status: DC | PRN

## 2020-02-20 MED ORDER — METHYLPHENIDATE HCL 10 MG PO TABS: 20.0000 mg | ORAL_TABLET | Freq: Three times a day (TID) | ORAL | 0 refills | Status: DC

## 2020-03-07 ENCOUNTER — Ambulatory Visit (INDEPENDENT_AMBULATORY_CARE_PROVIDER_SITE_OTHER): Payer: Medicare Other | Admitting: Family Medicine

## 2020-03-07 ENCOUNTER — Other Ambulatory Visit: Payer: Self-pay | Admitting: Family Medicine

## 2020-03-07 ENCOUNTER — Other Ambulatory Visit: Payer: Self-pay

## 2020-03-07 ENCOUNTER — Encounter: Payer: Self-pay | Admitting: Family Medicine

## 2020-03-07 VITALS — BP 170/82 | HR 115 | Ht 62.0 in | Wt 181.0 lb

## 2020-03-07 DIAGNOSIS — E1149 Type 2 diabetes mellitus with other diabetic neurological complication: Secondary | ICD-10-CM | POA: Diagnosis not present

## 2020-03-07 DIAGNOSIS — Z79899 Other long term (current) drug therapy: Secondary | ICD-10-CM

## 2020-03-07 DIAGNOSIS — R5383 Other fatigue: Secondary | ICD-10-CM | POA: Diagnosis not present

## 2020-03-07 DIAGNOSIS — M5416 Radiculopathy, lumbar region: Secondary | ICD-10-CM

## 2020-03-07 LAB — POCT GLYCOSYLATED HEMOGLOBIN (HGB A1C): HbA1c, POC (controlled diabetic range): 6.3 % (ref 0.0–7.0)

## 2020-03-07 MED ORDER — METFORMIN HCL ER 500 MG PO TB24: 500.0000 mg | ORAL_TABLET | Freq: Two times a day (BID) | ORAL | 3 refills | Status: DC

## 2020-03-07 MED ORDER — METHOCARBAMOL 750 MG PO TABS: ORAL_TABLET | ORAL | 5 refills | Status: DC

## 2020-03-07 NOTE — Patient Instructions (Addendum)
Your A1c is great.  Keep doing what you are doing.   If you have labs (blood work) drawn today and your tests are completely normal, you will receive your results only by:  [place] (if you have MyChart) OR  A paper copy in the mail If you have any lab test that is abnormal or we need to change your treatment, we will call you to review the results.  Referred to Voncille Lo, Physical Therapy at [facility] on [facility].

## 2020-03-08 ENCOUNTER — Encounter: Payer: Self-pay | Admitting: Family Medicine

## 2020-03-08 LAB — CBC
Hematocrit: 35.5 % (ref 34.0–46.6)
Hemoglobin: 12.1 g/dL (ref 11.1–15.9)
MCH: 27.8 pg (ref 26.6–33.0)
MCHC: 34.1 g/dL (ref 31.5–35.7)
MCV: 82 fL (ref 79–97)
Platelets: 289 10*3/uL (ref 150–450)
RBC: 4.35 x10E6/uL (ref 3.77–5.28)
RDW: 13.1 % (ref 11.7–15.4)
WBC: 6.2 10*3/uL (ref 3.4–10.8)

## 2020-03-08 LAB — CMP14+EGFR
ALT: 14 IU/L (ref 0–32)
AST: 19 IU/L (ref 0–40)
Albumin/Globulin Ratio: 1.4 (ref 1.2–2.2)
Albumin: 4.3 g/dL (ref 3.8–4.8)
Alkaline Phosphatase: 65 IU/L (ref 48–121)
BUN/Creatinine Ratio: 23 (ref 12–28)
BUN: 16 mg/dL (ref 8–27)
Bilirubin Total: 0.2 mg/dL (ref 0.0–1.2)
CO2: 24 mmol/L (ref 20–29)
Calcium: 9.6 mg/dL (ref 8.7–10.3)
Chloride: 99 mmol/L (ref 96–106)
Creatinine, Ser: 0.71 mg/dL (ref 0.57–1.00)
GFR calc Af Amer: 100 mL/min/{1.73_m2} (ref >59)
GFR calc non Af Amer: 87 mL/min/{1.73_m2} (ref >59)
Globulin, Total: 3 g/dL (ref 1.5–4.5)
Glucose: 118 mg/dL — ABNORMAL HIGH (ref 65–99)
Potassium: 3.8 mmol/L (ref 3.5–5.2)
Sodium: 137 mmol/L (ref 134–144)
Total Protein: 7.3 g/dL (ref 6.0–8.5)

## 2020-03-08 LAB — TSH: TSH: 1.4 u[IU]/mL (ref 0.450–4.500)

## 2020-03-11 NOTE — Progress Notes (Signed)
CPT E&M Office Visit Time Before Visit; reviewing medical records (e.g. recent visits, labs, studies): 5 minutes During Visit (F2F time): 20 minutes After Visit (discussion with family or HCP, prescribing, ordering, referring, calling result/recommendations or documenting on same day): 5 minutes Total Visit Time: 30 minutes

## 2020-03-11 NOTE — Assessment & Plan Note (Signed)
Established problem worsened.  Start medications (robaxin, naproxen, Norco, gabapentin) and other therapies: Physical therapy Suspect cpossible lumbar spinal stenosis given bilateral ache into buttocks with prolonged standing

## 2020-03-11 NOTE — Assessment & Plan Note (Signed)
Established problem Controlled Continue current medications and other regiments  

## 2020-03-21 ENCOUNTER — Other Ambulatory Visit: Payer: Self-pay

## 2020-03-21 DIAGNOSIS — M180 Bilateral primary osteoarthritis of first carpometacarpal joints: Secondary | ICD-10-CM

## 2020-03-21 DIAGNOSIS — F988 Other specified behavioral and emotional disorders with onset usually occurring in childhood and adolescence: Secondary | ICD-10-CM

## 2020-03-21 DIAGNOSIS — M5416 Radiculopathy, lumbar region: Secondary | ICD-10-CM

## 2020-03-21 DIAGNOSIS — Q762 Congenital spondylolisthesis: Secondary | ICD-10-CM

## 2020-03-21 DIAGNOSIS — M159 Polyosteoarthritis, unspecified: Secondary | ICD-10-CM

## 2020-03-21 DIAGNOSIS — M47817 Spondylosis without myelopathy or radiculopathy, lumbosacral region: Secondary | ICD-10-CM

## 2020-03-21 MED ORDER — METHYLPHENIDATE HCL 10 MG PO TABS: 20.0000 mg | ORAL_TABLET | Freq: Three times a day (TID) | ORAL | 0 refills | Status: DC

## 2020-03-21 MED ORDER — HYDROCODONE-ACETAMINOPHEN 7.5-325 MG PO TABS: 1.0000 | ORAL_TABLET | Freq: Three times a day (TID) | ORAL | 0 refills | Status: DC | PRN

## 2020-03-26 ENCOUNTER — Other Ambulatory Visit: Payer: Self-pay

## 2020-03-26 ENCOUNTER — Ambulatory Visit: Payer: Medicare Other | Attending: Family Medicine | Admitting: Physical Therapy

## 2020-03-26 DIAGNOSIS — R293 Abnormal posture: Secondary | ICD-10-CM | POA: Diagnosis present

## 2020-03-26 DIAGNOSIS — M6281 Muscle weakness (generalized): Secondary | ICD-10-CM | POA: Insufficient documentation

## 2020-03-26 DIAGNOSIS — M5441 Lumbago with sciatica, right side: Secondary | ICD-10-CM | POA: Insufficient documentation

## 2020-03-26 DIAGNOSIS — R262 Difficulty in walking, not elsewhere classified: Secondary | ICD-10-CM | POA: Diagnosis present

## 2020-03-26 DIAGNOSIS — G8929 Other chronic pain: Secondary | ICD-10-CM | POA: Insufficient documentation

## 2020-03-26 DIAGNOSIS — M5442 Lumbago with sciatica, left side: Secondary | ICD-10-CM | POA: Diagnosis not present
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
## 2020-03-26 NOTE — Patient Instructions (Signed)
       Voncille Lo, PT Certified Exercise Expert for the Aging Adult  03/26/20 2:05 PM Phone: [phone] Fax: [phone]

## 2020-04-11 ENCOUNTER — Ambulatory Visit: Payer: Medicare Other | Admitting: Physical Therapy

## 2020-04-15 ENCOUNTER — Other Ambulatory Visit: Payer: Self-pay

## 2020-04-15 DIAGNOSIS — M180 Bilateral primary osteoarthritis of first carpometacarpal joints: Secondary | ICD-10-CM

## 2020-04-15 DIAGNOSIS — M47817 Spondylosis without myelopathy or radiculopathy, lumbosacral region: Secondary | ICD-10-CM

## 2020-04-15 DIAGNOSIS — M5416 Radiculopathy, lumbar region: Secondary | ICD-10-CM

## 2020-04-15 DIAGNOSIS — F988 Other specified behavioral and emotional disorders with onset usually occurring in childhood and adolescence: Secondary | ICD-10-CM

## 2020-04-15 DIAGNOSIS — Q762 Congenital spondylolisthesis: Secondary | ICD-10-CM

## 2020-04-15 DIAGNOSIS — M159 Polyosteoarthritis, unspecified: Secondary | ICD-10-CM

## 2020-04-15 MED ORDER — HYDROCODONE-ACETAMINOPHEN 7.5-325 MG PO TABS: 1.0000 | ORAL_TABLET | Freq: Three times a day (TID) | ORAL | 0 refills | Status: DC | PRN

## 2020-04-15 MED ORDER — METHYLPHENIDATE HCL 10 MG PO TABS: 20.0000 mg | ORAL_TABLET | Freq: Three times a day (TID) | ORAL | 0 refills | Status: DC

## 2020-04-16 ENCOUNTER — Ambulatory Visit: Payer: Medicare Other | Admitting: Physical Therapy

## 2020-04-16 ENCOUNTER — Encounter: Payer: Self-pay | Admitting: Physical Therapy

## 2020-04-16 ENCOUNTER — Other Ambulatory Visit: Payer: Self-pay

## 2020-04-16 DIAGNOSIS — M5442 Lumbago with sciatica, left side: Secondary | ICD-10-CM

## 2020-04-16 DIAGNOSIS — R262 Difficulty in walking, not elsewhere classified: Secondary | ICD-10-CM

## 2020-04-16 DIAGNOSIS — R293 Abnormal posture: Secondary | ICD-10-CM

## 2020-04-16 DIAGNOSIS — M6281 Muscle weakness (generalized): Secondary | ICD-10-CM

## 2020-04-16 NOTE — Patient Instructions (Addendum)
Sleep Tips    .Keep a consistent sleep schedule. Get up at the same time every day, even on weekends or during vacations. .Set a bedtime that is early enough for you to get at least 7 hours of sleep. .Don't go to bed unless you are sleepy.  .If you don't fall asleep after 20 minutes, get out of bed.  .Establish a relaxing bedtime routine.  .Use your bed only for sleep and sex.  .Make your bedroom quiet and relaxing. Keep the room at a comfortable, cool temperature.  .Limit exposure to bright light in the evenings. .Turn off electronic devices at least 30 minutes before bedtime. .Don't eat a large meal before bedtime. If you are hungry at night, eat a light, healthy snack.  .Exercise regularly and maintain a healthy diet.  Marland KitchenAvoid consuming caffeine in the late afternoon or evening.  .Avoid consuming alcohol before bedtime.  .Reduce your fluid intake before bedtime.  You need 7-9 hours of restorative sleep.  Sleeping on Back  Place pillow under knees. A pillow with cervical support and a roll around waist are also helpful. Copyright  VHI. All rights reserved.  Sleeping on Side Place pillow between knees. Use cervical support under neck and a roll around waist as needed. Copyright  VHI. All rights reserved.   Sleeping on Stomach   If this is the only desirable sleeping position, place pillow under lower legs, and under stomach or chest as needed.  Posture - Sitting   Sit upright, head facing forward. Try using a roll to support lower back. Keep shoulders relaxed, and avoid rounded back. Keep hips level with knees. Avoid crossing legs for long periods. Stand to Sit / Sit to Stand   To sit: Bend knees to lower self onto front edge of chair, then scoot back on seat. To stand: Reverse sequence by placing one foot forward, and scoot to front of seat. Use rocking motion to stand up.   Work Height and Reach  Ideal work height is no more than 2 to 4 inches below elbow level when  standing, and at elbow level when sitting. Reaching should be limited to arm's length, with elbows slightly bent.  Bending  Bend at hips and knees, not back. Keep feet shoulder-width apart.    Posture - Standing   Good posture is important. Avoid slouching and forward head thrust. Maintain curve in low back and align ears over shoul- ders, hips over ankles.  Alternating Positions   Alternate tasks and change positions frequently to reduce fatigue and muscle tension. Take rest breaks. Computer Work   Position work to Programmer, multimedia. Use proper work and seat height. Keep shoulders back and down, wrists straight, and elbows at right angles. Use chair that provides full back support. Add footrest and lumbar roll as needed.  Getting Into / Out of Car  Lower self onto seat, scoot back, then bring in one leg at a time. Reverse sequence to get out.  Dressing  Lie on back to pull socks or slacks over feet, or sit and bend leg while keeping back straight.    Housework - Sink  Place one foot on ledge of cabinet under sink when standing at sink for prolonged periods.   Pushing / Pulling  Pushing is preferable to pulling. Keep back in proper alignment, and use leg muscles to do the work.  Deep Squat   Squat and lift with both arms held against upper trunk. Tighten stomach muscles without holding breath. Use smooth  movements to avoid jerking.  Avoid Twisting   Avoid twisting or bending back. Pivot around using foot movements, and bend at knees if needed when reaching for articles.  Carrying Luggage   Distribute weight evenly on both sides. Use a cart whenever possible. Do not twist trunk. Move body as a unit.   Lifting Principles .Maintain proper posture and head alignment. .Slide object as close as possible before lifting. .Move obstacles out of the way. .Test before lifting; ask for help if too heavy. .Tighten stomach muscles without holding breath. .Use smooth  movements; do not jerk. .Use legs to do the work, and pivot with feet. .Distribute the work load symmetrically and close to the center of trunk. .Push instead of pull whenever possible.   Ask For Help   Ask for help and delegate to others when possible. Coordinate your movements when lifting together, and maintain the low back curve.  Log Roll   Lying on back, bend left knee and place left arm across chest. Roll all in one movement to the right. Reverse to roll to the left. Always move as one unit. Housework - Sweeping  Use long-handled equipment to avoid stooping.   Housework - Wiping  Position yourself as close as possible to reach work surface. Avoid straining your back.  Laundry - Unloading Wash   To unload small items at bottom of washer, lift leg opposite to arm being used to reach.  [place] close to area to be raked. Use arm movements to do the work. Keep back straight and avoid twisting.       Voncille Lo, PT Certified Exercise Expert for the Aging Adult  04/16/20 2:00 PM Phone: [phone] Fax: [phone]    Cart  When reaching into cart with one arm, lift opposite leg to keep back straight.   Getting Into / Out of Bed  Lower self to lie down on one side by raising legs and lowering head at the same time. Use arms to assist moving without twisting. Bend both knees to roll onto back if desired. To sit up, start from lying on side, and use same move-ments in reverse. Housework - Vacuuming  Hold the vacuum with arm held at side. Step back and forth to move it, keeping head up. Avoid twisting.   Laundry - IT consultant so that bending and twisting can be avoided.   Laundry - Unloading Dryer  Squat down to reach into clothes dryer or use a reacher.  Gardening - Weeding / Probation officer or Kneel. Knee pads may be helpful.                    Trigger Point Dry Needling  . What is  Trigger Point Dry Needling (DN)? o DN is a physical therapy technique used to treat muscle pain and dysfunction. Specifically, DN helps deactivate muscle trigger points (muscle knots).  o A thin filiform needle is used to penetrate the skin and stimulate the underlying trigger point. The goal is for a local twitch response (LTR) to occur and for the trigger point to relax. No medication of any kind is injected during the procedure.   . What Does Trigger Point Dry Needling Feel Like?  o The procedure feels different for each individual patient. Some patients report that they do not actually feel the needle enter the skin and overall the process is not painful. Very mild bleeding may occur. However, many patients feel a  deep cramping in the muscle in which the needle was inserted. This is the local twitch response.   Marland Kitchen How Will I feel after the treatment? o Soreness is normal, and the onset of soreness may not occur for a few hours. Typically this soreness does not last longer than two days.  o Bruising is uncommon, however; ice can be used to decrease any possible bruising.  o In rare cases feeling tired or nauseous after the treatment is normal. In addition, your symptoms may get worse before they get better, this period will typically not last longer than 24 hours.   . What Can I do After My Treatment? o Increase your hydration by drinking more water for the next 24 hours. o You may place ice or heat on the areas treated that have become sore, however, do not use heat on inflamed or bruised areas. Heat often brings more relief post needling. o You can continue your regular activities, but vigorous activity is not recommended initially after the treatment for 24 hours. o DN is best combined with other physical therapy such as strengthening, stretching, and other therapies.   Voncille Lo, PT Certified Exercise Expert for the Aging Adult  04/16/20 2:12 PM Phone: [phone] Fax:  [phone]

## 2020-04-16 NOTE — Therapy (Signed)
[place], [place], [address] Phone: [phone]   Fax:  [phone]  Physical Therapy Treatment  Patient Details  Name: [name] MRN: [id number] Date of Birth: 1951/03/15 Referring Provider (PT): McDiarmid  Sherren Mocha MD   Encounter Date: 04/16/2020   PT End of Session - 04/16/20 1337    Visit Number 2    Number of Visits 9    Date for PT Re-Evaluation 05/21/20    Authorization Type MCR  needs progress note at 10th visit    PT Start Time 1336    PT Stop Time 1435    PT Time Calculation (min) 59 min    Activity Tolerance Patient tolerated treatment well    Behavior During Therapy [facility] for tasks assessed/performed           Past Medical History:  Diagnosis Date  . Acute MEE (middle ear effusion) 06/07/2014  . Acute recurrent sinusitis 12/21/2014  . Acute sinusitis 12/21/2014  . Anemia    hx  . ATTENTION DEFICIT, W/O HYPERACTIVITY 12/16/2006  . Bilateral carpal tunnel syndrome 11/20/2016  . Cervical spondylosis with myelopathy, History of 04/28/2012  . DIABETES MELLITUS II, UNCOMPLICATED 4/82/5003  . DIFFUSE IDIOPATHIC SKELETAL HYPEROSTOSIS 03/29/2008  . GERD (gastroesophageal reflux disease)   . H/O abnormal mammogram 07/19/2009   S/P Breast Biopsy ([place], 07/2009): Benign findings.    . 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  . HEMORRHOIDS, NOS 12/16/2006   Qualifier: History of  By: McDiarmid MD, Sherren Mocha    . History of blood transfusion 10/2004   with knee replacement  . History of peptic ulcer 12/16/2006   UGI series PUD - 10/19/1998    . History of right greater trochanteric bursitis 11/07/2009  . HYPERCHOLESTEROLEMIA 02/28/2007  . HYPERTENSION, BENIGN SYSTEMIC 12/16/2006  . Hyponatremia 08/30/2015  . INCONTINENCE, URGE 12/16/2006   Qualifier: History of  By: McDiarmid MD, Sherren Mocha    . 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/20/2016  . OSTEOARTHRITIS, MULTI SITES 12/16/2006  . Perennial  allergic rhinitis with seasonal variation 12/16/2006       . PONV (postoperative nausea and vomiting)    last surgery only  . Recurrent maxillary sinusitis   . SACROILIITIS, HISTORY OF 01/08/2009   Qualifier: History of  By: McDiarmid MD, Sherren Mocha    . Seasonal allergies   . Ulcer   . Uterine fibroid 02/02/2011   TVUS: 4cm fibroid w/ submucosal component, bil.hydrosalpinges, unable measurable endometrium - 08/18/2004     Past Surgical History:  Procedure Laterality Date  . ANTERIOR CERVICAL DECOMP/DISCECTOMY FUSION  04/28/2012   Procedure: ANTERIOR CERVICAL DECOMPRESSION/DISCECTOMY FUSION 3 LEVELS;  Surgeon: Ophelia Charter, MD;  Location: [place] [facility];  Service: Neurosurgery;  Laterality: N/A;  Cervical three-four,Cervical four-five,Cervical five-six anterior cervical decompression with fusion interbody prothesis plating and bonegraft  . BREAST BIOPSY  07/2009   S/P Breast Biopsy [facility] [place], 07/2009): Benign findings.  (10/27/2010)  . CARPAL TUNNEL RELEASE Left 12/10/2016   Procedure: LEFT CARPAL TUNNEL RELEASE;  Surgeon: Daryll Brod, MD;  Location: [place];  Service: Orthopedics;  Laterality: Left;  . COLONOSCOPY W/ POLYPECTOMY  12/2000   colonoscopy (Dr Collene Mares) int. hemorrhoids &  nonneoplatic colon polyps - 01/10/2001,   . COLONOSCOPY W/ POLYPECTOMY  01/2012   Dr Hilarie Fredrickson (GI).sigmoid colon, hyperlastic polyp  . ENDOMETRIAL BIOPSY  08/2004   Endometrial Biopsy - 09/01/2004,  . NM MYOVIEW LTD  04/2004   Cardiolite:EF63%,  no ischemia - 05/01/2004,   . REPLACEMENT TOTAL KNEE BILATERAL      There were no vitals filed for this visit.   Subjective Assessment - 04/16/20 1340    Subjective I have not been able to do the exercises and get unpacked that I feel I havent devoted time to exericises. I have been sleeping better    Pertinent History Cervical spondylosis with myelopathy with cervical fusion, carpal tunnel syndrome, DM Sacroilitis, GERD  See Medical  History    Limitations Standing;Walking    Patient Stated Goals MOve better and to take care of household stuff without pain limiting cooking sweeping and laundry, cant drive in car difficulty getting in and out of car,    Currently in Pain? Yes    Pain Score 6     Pain Location Back    Pain Orientation Right;Left;Mid    Pain Descriptors / Indicators Aching;Sharp    Pain Type Chronic pain    Pain Onset More than a month ago    Pain Frequency Intermittent                             [facility] Adult PT Treatment/Exercise - 04/16/20 0001      Self-Care   Self-Care ADL's;Lifting;Posture;Other Self-Care Comments    ADL's basic houselhold tips for posture/body mechanics    Lifting basic lifting on 8 inch step without weight to simulate laundry    Posture sitting, standing, and sleeping postures with supports    Other Self-Care Comments  sleep hygiene      Lumbar Exercises: Stretches   Single Knee to Chest Stretch Right;Left;4 reps;30 seconds    Single Knee to Chest Stretch Limitations Rt/LT    Double Knee to Chest Stretch 4 reps;30 seconds    Lower Trunk Rotation 5 reps    Lower Trunk Rotation Limitations rt/lt each    Piriformis Stretch Right;Left;4 reps    Other Lumbar Stretch Exercise hooklying Lumbar Traction 10 x hold 10 sec      Lumbar Exercises: Standing   Other Standing Lumbar Exercises sit to stand 10 x 2 at sink  hip hinge with dowel 20 x, with exterrnal cue with wall  2 x 10      Lumbar Exercises: Supine   Ab Set 10 reps      Modalities   Modalities Moist Heat      Moist Heat Therapy   Number Minutes Moist Heat 15 Minutes    Moist Heat Location Lumbar Spine      Manual Therapy   Manual Therapy Soft tissue mobilization;Myofascial release    Manual therapy comments skilled palpation with TPDN    Soft tissue mobilization STW to lumbar paraspinals and gluteal bil    Myofascial Release bil QL            Trigger Point Dry Needling - 04/16/20 0001      Consent Given? Yes    Education Handout Provided Yes    Muscles Treated Back/Hip Gluteus minimus;Gluteus maximus;Piriformis;Lumbar multifidi;Quadratus lumborum   bil   Other Dry Needling 75 mm .30 gage    Gluteus Minimus Response Twitch response elicited;Palpable increased muscle length    Gluteus Maximus Response Twitch response elicited;Palpable increased muscle length    Piriformis Response Twitch response elicited;Palpable increased muscle length    Lumbar multifidi Response Twitch response elicited;Palpable increased muscle length    Quadratus Lumborum Response Palpable increased muscle length  PT Education - 04/16/20 1401    Education Details added hip hinge and sit to stand at sink to HEP ,  sleep hygiene and posture, ADL body mechanics    Person(s) Educated Patient    Methods Explanation;Demonstration;Tactile cues;Verbal cues;Handout    Comprehension Verbalized understanding;Returned demonstration            PT Short Term Goals - 04/16/20 1429      PT SHORT TERM GOAL #1   Title Pt will be independent with intial HEP    Baseline given HEP intiial    Time 4    Period Weeks    Status On-going    Target Date 04/23/20      PT SHORT TERM GOAL #2   Title Demonstrate and verbalize techniques to reduce the risk of re-injury including: lifting, posture, body mechanics.    Time 4    Period Weeks    Status On-going    Target Date 04/23/20      PT SHORT TERM GOAL #3   Title Pt will demonstrate 5 x STS in 13.0 sec or less to show improved LE strength    Baseline 5 x sts 21 sec    Time 4    Period Weeks    Status On-going    Target Date 04/23/20             PT Long Term Goals - 03/26/20 1425      PT LONG TERM GOAL #1   Title Pt will be independent with all HEP given/walking program    Baseline no knowlege    Time 8    Period Weeks    Status New    Target Date 05/21/20      PT LONG TERM GOAL #2   Title Pt will be able to drive in car 1-2  hours with pain managment techniques to prevent spasm and pain    Baseline Pt unable to drive more than an hour with out pain 8/10 or more    Time 8    Period Weeks    Status New    Target Date 05/21/20      PT LONG TERM GOAL #3   Title Pt will be able to rise from floor and descend safely and independently    Baseline Pt unable to perform floor transfers without help    Time 8    Period Weeks    Status New    Target Date 05/21/20      PT LONG TERM GOAL #4   Title Pt will ascend /descend steps with pain 2/10 or less)    Baseline Pt unable to ascend steps wthout UE support and pain greater than 5/10    Time 8    Period Weeks    Status New    Target Date 05/21/20      PT LONG TERM GOAL #5   Title FOTO will improve from  61% limitation  to49%     indicating improved functional mobility.    Baseline eval 61% limitation    Time 8    Period Weeks    Status New    Target Date 05/21/20                 Plan - 04/16/20 1420    Clinical Impression Statement Pt returns to clinic with quad cane but barely using it.  Pt told to use a straight point cane due to she was almost triping over the mini legs of quad  cane.  Pt was not able to do exericise as much as she wanted. In this session, Pt was educated on body mechanics , posture and sleep positioning and sleep hygiene and prioritizing movement during the day. Pt also consented to TPDN and was closely monitored throughout session.  Pt reports being a 2/10 pain at end of session and was 6/10 at beginning.  Pt definitely is hypomobile and has forward flexed trunk posture with kyphosis but was able to stand more erect after end of session.    Personal Factors and Comorbidities Age;Comorbidity 1;Comorbidity 2    Comorbidities Cervical spondylosis with myelopathy with cervical fusion, carpal tunnel syndrome, DM Sacroilitis, GERD  See Medical History    Examination-Activity Limitations Squat;Stairs;Stand;Caring for Others;Bed  Mobility;Transfers    Examination-Participation Restrictions Cleaning;Driving;Laundry;Meal Prep    PT Frequency 1x / week    PT Duration 8 weeks    PT Treatment/Interventions ADLs/Self Care Home Management;Cryotherapy;Electrical Stimulation;Iontophoresis 4mg /ml Dexamethasone;Moist Heat;Traction;Ultrasound;Therapeutic exercise;Therapeutic activities;Functional mobility training;Stair training;Gait training;Neuromuscular re-education;Patient/family education;Passive range of motion;Manual techniques;Dry needling;Taping;Joint Manipulations;Spinal Manipulations    PT Next Visit Plan Review sink squat, hip hinge  add dead lift.  Assess TPDN  aske about slelep hygience, add ab set and pelvic tilt    PT Home Exercise Plan [id number]   [id number]    Consulted and Agree with Plan of Care Patient           Patient will benefit from skilled therapeutic intervention in order to improve the following deficits and impairments:  Obesity, Pain, Postural dysfunction, Improper body mechanics, Impaired flexibility, Hypermobility, Decreased strength, Decreased range of motion, Difficulty walking, Decreased mobility  Visit Diagnosis: Chronic bilateral low back pain with bilateral sciatica  Muscle weakness (generalized)  Abnormal posture  Difficulty in walking, not elsewhere classified     Problem List Patient Active Problem List   Diagnosis Date Noted  . Diabetic peripheral neuropathy associated with type 2 diabetes mellitus ([place]) 07/24/2019  . Asthma, intermittent 11/02/2018  . Lumbar back pain with radiculopathy affecting right lower extremity 06/09/2018  . Recurrent maxillary sinusitis   . Encounter for long-term opiate analgesic use 04/01/2017  . Primary osteoarthritis of both first carpometacarpal joints 11/20/2016  . Seborrheic keratosis 01/15/2016  . Health care maintenance 11/24/2011  . SPONDYLOSIS, LUMBAR 03/29/2008  . SPONDYLOLISTHESIS 03/29/2008  . Hyperlipidemia associated with type 2  diabetes mellitus ([place]) 02/28/2007  . COLON POLYP 12/16/2006  . Type 2 diabetes mellitus with neurological complications ([place]) 73/53/2992  . OBESITY, NOS 12/16/2006  . Attention deficit disorder 12/16/2006  . Hypertension associated with diabetes ([place]) 12/16/2006  . Perennial allergic rhinitis with seasonal variation 12/16/2006  . GASTROESOPHAGEAL REFLUX, NO ESOPHAGITIS 12/16/2006  . Osteoarthritis, multiple sites 12/16/2006    Voncille Lo, PT Certified Exercise Expert for the Aging Adult  04/16/20 2:43 PM Phone: [phone] Fax: [facility] [address] [place], [place], [address] Phone: [phone]   Fax:  [phone]  Name: KHARMA SAMPSEL MRN: [id number] Date of Birth: 1951/03/28

## 2020-04-30 ENCOUNTER — Ambulatory Visit: Payer: Medicare Other | Attending: Family Medicine | Admitting: Physical Therapy

## 2020-04-30 ENCOUNTER — Other Ambulatory Visit: Payer: Self-pay

## 2020-04-30 ENCOUNTER — Encounter: Payer: Self-pay | Admitting: Physical Therapy

## 2020-04-30 DIAGNOSIS — R293 Abnormal posture: Secondary | ICD-10-CM | POA: Diagnosis not present

## 2020-04-30 DIAGNOSIS — G8929 Other chronic pain: Secondary | ICD-10-CM

## 2020-04-30 DIAGNOSIS — M6281 Muscle weakness (generalized): Secondary | ICD-10-CM | POA: Diagnosis not present

## 2020-04-30 DIAGNOSIS — M5442 Lumbago with sciatica, left side: Secondary | ICD-10-CM | POA: Diagnosis not present

## 2020-04-30 DIAGNOSIS — M5441 Lumbago with sciatica, right side: Secondary | ICD-10-CM | POA: Insufficient documentation

## 2020-04-30 DIAGNOSIS — R262 Difficulty in walking, not elsewhere classified: Secondary | ICD-10-CM

## 2020-04-30 NOTE — Therapy (Addendum)
[place], [place], [address] Phone: [phone]   Fax:  [phone]  Physical Therapy Treatment  Patient Details  Name: [name] MRN: [id number] Date of Birth: [birth year] Referring Provider (PT): McDiarmid  Sherren Mocha MD   Encounter Date: 04/30/2020   PT End of Session - 04/30/20 1336    Visit Number 3    Number of Visits 9    Date for PT Re-Evaluation 05/21/20    Authorization Type MCR  needs progress note at 10th visit    PT Start Time 1335    PT Stop Time 1433    PT Time Calculation (min) 58 min    Activity Tolerance Patient tolerated treatment well    Behavior During Therapy [facility] for tasks assessed/performed           Past Medical History:  Diagnosis Date  . Acute MEE (middle ear effusion) 06/07/2014  . Acute recurrent sinusitis 12/21/2014  . Acute sinusitis 12/21/2014  . Anemia    hx  . ATTENTION DEFICIT, W/O HYPERACTIVITY 12/16/2006  . Bilateral carpal tunnel syndrome 11/20/2016  . Cervical spondylosis with myelopathy, History of 04/28/2012  . DIABETES MELLITUS II, UNCOMPLICATED 3/41/9622  . DIFFUSE IDIOPATHIC SKELETAL HYPEROSTOSIS 03/29/2008  . GERD (gastroesophageal reflux disease)   . H/O abnormal mammogram 07/19/2009   S/P Breast Biopsy ([place], 07/2009): Benign findings.    . 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  . HEMORRHOIDS, NOS 12/16/2006   Qualifier: History of  By: McDiarmid MD, Sherren Mocha    . History of blood transfusion 10/2004   with knee replacement  . History of peptic ulcer 12/16/2006   UGI series PUD - 10/19/1998    . History of right greater trochanteric bursitis 11/07/2009  . HYPERCHOLESTEROLEMIA 02/28/2007  . HYPERTENSION, BENIGN SYSTEMIC 12/16/2006  . Hyponatremia 08/30/2015  . INCONTINENCE, URGE 12/16/2006   Qualifier: History of  By: McDiarmid MD, Sherren Mocha    . 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/20/2016  . OSTEOARTHRITIS, MULTI SITES 12/16/2006  . Perennial  allergic rhinitis with seasonal variation 12/16/2006       . PONV (postoperative nausea and vomiting)    last surgery only  . Recurrent maxillary sinusitis   . SACROILIITIS, HISTORY OF 01/08/2009   Qualifier: History of  By: McDiarmid MD, Sherren Mocha    . Seasonal allergies   . Ulcer   . Uterine fibroid 02/02/2011   TVUS: 4cm fibroid w/ submucosal component, bil.hydrosalpinges, unable measurable endometrium - 08/18/2004     Past Surgical History:  Procedure Laterality Date  . ANTERIOR CERVICAL DECOMP/DISCECTOMY FUSION  04/28/2012   Procedure: ANTERIOR CERVICAL DECOMPRESSION/DISCECTOMY FUSION 3 LEVELS;  Surgeon: Ophelia Charter, MD;  Location: [facility];  Service: Neurosurgery;  Laterality: N/A;  Cervical three-four,Cervical four-five,Cervical five-six anterior cervical decompression with fusion interbody prothesis plating and bonegraft  . BREAST BIOPSY  07/2009   S/P Breast Biopsy [facility] [place], 07/2009): Benign findings.  (10/27/2010)  . CARPAL TUNNEL RELEASE Left 12/10/2016   Procedure: LEFT CARPAL TUNNEL RELEASE;  Surgeon: Daryll Brod, MD;  Location: [facility];  Service: Orthopedics;  Laterality: Left;  . COLONOSCOPY W/ POLYPECTOMY  12/2000   colonoscopy (Dr Collene Mares) int. hemorrhoids &  nonneoplatic colon polyps - 01/10/2001,   . COLONOSCOPY W/ POLYPECTOMY  01/2012   Dr Hilarie Fredrickson (GI).sigmoid colon, hyperlastic polyp  . ENDOMETRIAL BIOPSY  08/2004   Endometrial Biopsy - 09/01/2004,  . NM MYOVIEW LTD  04/2004   Cardiolite:EF63%,  no ischemia - 05/01/2004,   . REPLACEMENT TOTAL KNEE BILATERAL      There were no vitals filed for this visit.   Subjective Assessment - 04/30/20 1339    Subjective I did not do a lot over the holiday.  I setting up some furniture,  I am not using a cane anymore.  I felt well enough to do it. I still feel a catch on my center of my back radiating back    Pertinent History Cervical spondylosis with myelopathy with cervical fusion, carpal  tunnel syndrome, DM Sacroilitis, GERD  See Medical History    Limitations Standing;Walking    How long can you sit comfortably? I can sit in a comfortable chair as long as I need to  not a hard surface    How long can you stand comfortably? 10-15 min    How long can you walk comfortably? 12-15 min    Currently in Pain? Yes    Pain Score 5     Pain Location Back    Pain Orientation Right;Left;Mid    Pain Descriptors / Indicators Aching;Sharp    Pain Onset More than a month ago    Pain Frequency Intermittent              [facility] PT Assessment - 04/30/20 0001      Assessment   Medical Diagnosis chronic back pain    Referring Provider (PT) McDiarmid  Sherren Mocha MD    Onset Date/Surgical Date 09/26/19   gradually over time approx 6 months     Sit to Stand   Comments 5xsts 13.59 se c                         [facility] Adult PT Treatment/Exercise - 04/30/20 0001      Ambulation/Gait   Stairs Yes    Stairs Assistance 7: Independent    Stair Management Technique One rail Right;Alternating pattern    Number of Stairs 8    Height of Stairs 6    Gait Comments walking now without AD step ups x 15 and lateral step ups x 15      Self-Care   Posture reviewed posture strategies    Other Self-Care Comments  reviewed ADL/posture and pt stated at least 3 strategies      Lumbar Exercises: Stretches   Single Knee to Chest Stretch Right;Left;2 reps;30 seconds    Double Knee to Chest Stretch 1 rep;30 seconds    Lower Trunk Rotation 5 reps;20 seconds    Lower Trunk Rotation Limitations rt/lt each    Piriformis Stretch Right;Left;4 reps      Lumbar Exercises: Standing   Other Standing Lumbar Exercises sink squat x 10       Lumbar Exercises: Supine   Ab Set 10 reps    AB Set Limitations 10 secVC and TC    Pelvic Tilt 10 reps    Pelvic Tilt Limitations VC and TC    Bridge 10 reps    Other Supine Lumbar Exercises hooklying lumbar traction hold 10 sec x 10            Trigger  Point Dry Needling - 04/30/20 0001    Consent Given? Yes    Education Handout Provided Previously provided    Muscles Treated Back/Hip Gluteus minimus;Gluteus maximus;Piriformis;Lumbar multifidi;Quadratus lumborum   bil   Other Dry Needling 75 mm .30 gage    Gluteus Minimus Response Palpable increased muscle length    Gluteus  Maximus Response Palpable increased muscle length    Piriformis Response Twitch response elicited;Palpable increased muscle length    Lumbar multifidi Response Twitch response elicited;Palpable increased muscle length    Quadratus Lumborum Response Twitch response elicited;Palpable increased muscle length   LT twitch only               PT Education - 04/30/20 1356    Education Details added step ups forward and lateral to HEP    Person(s) Educated Patient    Methods Explanation;Demonstration;Tactile cues;Verbal cues;Handout    Comprehension Verbalized understanding;Returned demonstration            PT Short Term Goals - 04/30/20 1342      PT SHORT TERM GOAL #1   Title Pt will be independent with intial HEP    Baseline I with inital HEP    Time 4    Period Weeks    Status Achieved      PT SHORT TERM GOAL #2   Title Demonstrate and verbalize techniques to reduce the risk of re-injury including: lifting, posture, body mechanics.    Baseline able to state at least 3 ways to be aware of posture or modigy chores to reduce tension in back    Time 4    Period Weeks    Status Achieved    Target Date 04/23/20      PT SHORT TERM GOAL #3   Title Pt will demonstrate 5 x STS in 13.0 sec or less to show improved LE strength    Baseline 5 x sts  13.59    Time 4    Period Weeks    Status Partially Met             PT Long Term Goals - 03/26/20 1425      PT LONG TERM GOAL #1   Title Pt will be independent with all HEP given/walking program    Baseline no knowlege    Time 8    Period Weeks    Status New    Target Date 05/21/20      PT LONG TERM  GOAL #2   Title Pt will be able to drive in car 1-2 hours with pain managment techniques to prevent spasm and pain    Baseline Pt unable to drive more than an hour with out pain 8/10 or more    Time 8    Period Weeks    Status New    Target Date 05/21/20      PT LONG TERM GOAL #3   Title Pt will be able to rise from floor and descend safely and independently    Baseline Pt unable to perform floor transfers without help    Time 8    Period Weeks    Status New    Target Date 05/21/20      PT LONG TERM GOAL #4   Title Pt will ascend /descend steps with pain 2/10 or less)    Baseline Pt unable to ascend steps wthout UE support and pain greater than 5/10    Time 8    Period Weeks    Status New    Target Date 05/21/20      PT LONG TERM GOAL #5   Title FOTO will improve from  61% limitation  to49%     indicating improved functional mobility.    Baseline eval 61% limitation    Time 8    Period Weeks    Status New  Target Date 05/21/20                 Plan - 04/30/20 1429    Clinical Impression Statement Pt returns to clinic without use of cane and walking better although she still flexes trunk forward.  Pt able to alternate steps on stairs and use handrail on RT hand.  Pt reports that TPDN did provide releif and she isnow a 5/10 pain instread of 6/10  at end of session pt was reduced to a 3/10 pain level.  she did report that she did feel better after she did exericises iniitally but does feel a " catch" in mid low back.  Pt consented to TPDN and was  closely monitored throughout session.  Pt was able to state at least 3 stretegies for body mechanics and and reducing back pain with ADL's. Pt 5 xsts imporved from 21 sec to 13.59 sec.  All STG met or partially met today. will continue POC for LTG's    Personal Factors and Comorbidities Age;Comorbidity 1;Comorbidity 2    Comorbidities Cervical spondylosis with myelopathy with cervical fusion, carpal tunnel syndrome, DM Sacroilitis,  GERD  See Medical History    Examination-Activity Limitations Squat;Stairs;Stand;Caring for Others;Bed Mobility;Transfers    PT Frequency 1x / week    PT Duration 8 weeks    PT Treatment/Interventions ADLs/Self Care Home Management;Cryotherapy;Electrical Stimulation;Iontophoresis 46m/ml Dexamethasone;Moist Heat;Traction;Ultrasound;Therapeutic exercise;Therapeutic activities;Functional mobility training;Stair training;Gait training;Neuromuscular re-education;Patient/family education;Passive range of motion;Manual techniques;Dry needling;Taping;Joint Manipulations;Spinal Manipulations    PT Next Visit Plan t, hip hinge  add dead lift. , add ab set and pelvic tilt if necessary  work on dReal  [id number]   Consulted and Agree with Plan of Care Patient           Patient will benefit from skilled therapeutic intervention in order to improve the following deficits and impairments:  Obesity, Pain, Postural dysfunction, Improper body mechanics, Impaired flexibility, Hypermobility, Decreased strength, Decreased range of motion, Difficulty walking, Decreased mobility  Visit Diagnosis: Chronic bilateral low back pain with bilateral sciatica  Muscle weakness (generalized)  Abnormal posture  Difficulty in walking, not elsewhere classified     Problem List Patient Active Problem List   Diagnosis Date Noted  . Diabetic peripheral neuropathy associated with type 2 diabetes mellitus ([place] 07/24/2019  . Asthma, intermittent 11/02/2018  . Lumbar back pain with radiculopathy affecting right lower extremity 06/09/2018  . Recurrent maxillary sinusitis   . Encounter for long-term opiate analgesic use 04/01/2017  . Primary osteoarthritis of both first carpometacarpal joints 11/20/2016  . Seborrheic keratosis 01/15/2016  . Health care maintenance 11/24/2011  . SPONDYLOSIS, LUMBAR 03/29/2008  . SPONDYLOLISTHESIS 03/29/2008  . Hyperlipidemia associated with type 2  diabetes mellitus ([facility] 02/28/2007  . COLON POLYP 12/16/2006  . Type 2 diabetes mellitus with neurological complications ([facility] 043/56/8616 . OBESITY, NOS 12/16/2006  . Attention deficit disorder 12/16/2006  . Hypertension associated with diabetes ([facility] 12/16/2006  . Perennial allergic rhinitis with seasonal variation 12/16/2006  . GASTROESOPHAGEAL REFLUX, NO ESOPHAGITIS 12/16/2006  . Osteoarthritis, multiple sites 12/16/2006   LVoncille Lo PT Certified Exercise Expert for the Aging Adult  04/30/20 2:47 PM Phone: [phone]: 3[facility] [address]Phone: [phone]  Fax:  [phone] Name: DDESTINA MANTEIMRN: [id number] of Birth: 1May 03, 1952

## 2020-04-30 NOTE — Patient Instructions (Signed)
   Copyright  VHI. All rights reserved.  Step-Up: Lateral   Step up to side with right leg. Bring other foot up onto __6__ inch step. Return to floor position with left leg. Repeat __15x2__ times per session. Do ___1_ sessions per day. Repeat in dimly lit room. Repeat with eyes closed.  Copyright  VHI. All rights reserved.  Forward   Facing step, place one leg on step, flexed at hip. Step up slowly, bringing hips in line with knee and shoulder. Bring other foot onto step. Reverse process to step back down. Repeat with other leg. Do _15 ___ repetitions, _2__ sets.  [email or web address].[email or web address]   Copyright  VHI. All rights reserved.  Voncille Lo, PT Certified Exercise Expert for the Aging Adult  04/30/20 1:55 PM Phone: [phone] Fax: [phone]

## 2020-05-03 ENCOUNTER — Other Ambulatory Visit: Payer: Self-pay | Admitting: Family Medicine

## 2020-05-03 DIAGNOSIS — Q762 Congenital spondylolisthesis: Secondary | ICD-10-CM

## 2020-05-03 DIAGNOSIS — M47817 Spondylosis without myelopathy or radiculopathy, lumbosacral region: Secondary | ICD-10-CM

## 2020-05-07 ENCOUNTER — Ambulatory Visit: Payer: Medicare Other | Admitting: Physical Therapy

## 2020-05-14 ENCOUNTER — Encounter: Payer: Self-pay | Admitting: Physical Therapy

## 2020-05-14 ENCOUNTER — Other Ambulatory Visit: Payer: Self-pay

## 2020-05-14 ENCOUNTER — Ambulatory Visit: Payer: Medicare Other | Admitting: Physical Therapy

## 2020-05-14 DIAGNOSIS — M5442 Lumbago with sciatica, left side: Secondary | ICD-10-CM | POA: Diagnosis not present

## 2020-05-14 DIAGNOSIS — M6281 Muscle weakness (generalized): Secondary | ICD-10-CM | POA: Diagnosis not present

## 2020-05-14 DIAGNOSIS — M5441 Lumbago with sciatica, right side: Secondary | ICD-10-CM

## 2020-05-14 DIAGNOSIS — R262 Difficulty in walking, not elsewhere classified: Secondary | ICD-10-CM | POA: Diagnosis not present

## 2020-05-14 DIAGNOSIS — R293 Abnormal posture: Secondary | ICD-10-CM

## 2020-05-14 DIAGNOSIS — G8929 Other chronic pain: Secondary | ICD-10-CM | POA: Diagnosis not present

## 2020-05-14 NOTE — Therapy (Signed)
[place], [place], [address] Phone: [phone]   Fax:  [phone]  Physical Therapy Treatment  Patient Details  Name: [name] MRN: [id number] Date of Birth: 1951/01/24 Referring Provider (PT): McDiarmid  Sherren Mocha MD   Encounter Date: 05/14/2020   PT End of Session - 05/14/20 1425    Visit Number 4    Number of Visits 9    Date for PT Re-Evaluation 05/21/20    Authorization Type MCR  needs progress note at 10th visit    PT Start Time 1340    PT Stop Time 1430    PT Time Calculation (min) 50 min    Activity Tolerance Patient tolerated treatment well    Behavior During Therapy [facility] for tasks assessed/performed           Past Medical History:  Diagnosis Date  . Acute MEE (middle ear effusion) 06/07/2014  . Acute recurrent sinusitis 12/21/2014  . Acute sinusitis 12/21/2014  . Anemia    hx  . ATTENTION DEFICIT, W/O HYPERACTIVITY 12/16/2006  . Bilateral carpal tunnel syndrome 11/20/2016  . Cervical spondylosis with myelopathy, History of 04/28/2012  . DIABETES MELLITUS II, UNCOMPLICATED 07/03/568  . DIFFUSE IDIOPATHIC SKELETAL HYPEROSTOSIS 03/29/2008  . GERD (gastroesophageal reflux disease)   . H/O abnormal mammogram 07/19/2009   S/P Breast Biopsy ([place], 07/2009): Benign findings.    . 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  . HEMORRHOIDS, NOS 12/16/2006   Qualifier: History of  By: McDiarmid MD, Sherren Mocha    . History of blood transfusion 10/2004   with knee replacement  . History of peptic ulcer 12/16/2006   UGI series PUD - 10/19/1998    . History of right greater trochanteric bursitis 11/07/2009  . HYPERCHOLESTEROLEMIA 02/28/2007  . HYPERTENSION, BENIGN SYSTEMIC 12/16/2006  . Hyponatremia 08/30/2015  . INCONTINENCE, URGE 12/16/2006   Qualifier: History of  By: McDiarmid MD, Sherren Mocha    . 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/20/2016  . OSTEOARTHRITIS, MULTI SITES 12/16/2006  . Perennial  allergic rhinitis with seasonal variation 12/16/2006       . PONV (postoperative nausea and vomiting)    last surgery only  . Recurrent maxillary sinusitis   . SACROILIITIS, HISTORY OF 01/08/2009   Qualifier: History of  By: McDiarmid MD, Sherren Mocha    . Seasonal allergies   . Ulcer   . Uterine fibroid 02/02/2011   TVUS: 4cm fibroid w/ submucosal component, bil.hydrosalpinges, unable measurable endometrium - 08/18/2004     Past Surgical History:  Procedure Laterality Date  . ANTERIOR CERVICAL DECOMP/DISCECTOMY FUSION  04/28/2012   Procedure: ANTERIOR CERVICAL DECOMPRESSION/DISCECTOMY FUSION 3 LEVELS;  Surgeon: Ophelia Charter, MD;  Location: [facility];  Service: Neurosurgery;  Laterality: N/A;  Cervical three-four,Cervical four-five,Cervical five-six anterior cervical decompression with fusion interbody prothesis plating and bonegraft  . BREAST BIOPSY  07/2009   S/P Breast Biopsy [facility], [place], 07/2009): Benign findings.  (10/27/2010)  . CARPAL TUNNEL RELEASE Left 12/10/2016   Procedure: LEFT CARPAL TUNNEL RELEASE;  Surgeon: Daryll Brod, MD;  Location: [facility];  Service: Orthopedics;  Laterality: Left;  . COLONOSCOPY W/ POLYPECTOMY  12/2000   colonoscopy (Dr Collene Mares) int. hemorrhoids &  nonneoplatic colon polyps - 01/10/2001,   . COLONOSCOPY W/ POLYPECTOMY  01/2012   Dr Hilarie Fredrickson (GI).sigmoid colon, hyperlastic polyp  . ENDOMETRIAL BIOPSY  08/2004   Endometrial Biopsy - 09/01/2004,  . NM MYOVIEW LTD  04/2004   Cardiolite:EF63%,  no ischemia - 05/01/2004,   . REPLACEMENT TOTAL KNEE BILATERAL      There were no vitals filed for this visit.   Subjective Assessment - 05/14/20 1348    Subjective I was putting together furniture this weekend. My back is hurting more.I feel like my back is getting more tight today  I never had PT after my neck surgery so this really makes a difference for me    Pertinent History Cervical spondylosis with myelopathy with cervical  fusion, carpal tunnel syndrome, DM Sacroilitis, GERD  See Medical History    Limitations Standing;Walking    Patient Stated Goals MOve better and to take care of household stuff without pain limiting cooking sweeping and laundry, cant drive in car difficulty getting in and out of car,    Currently in Pain? Yes    Pain Score 7     Pain Location Back    Pain Orientation Right;Left;Mid    Pain Descriptors / Indicators Aching;Tightness    Pain Type Chronic pain    Pain Onset More than a month ago    Pain Frequency Intermittent                             [facility] Adult PT Treatment/Exercise - 05/14/20 0001      Lumbar Exercises: Stretches   Single Knee to Chest Stretch Right;Left;2 reps;30 seconds    Lower Trunk Rotation 5 reps;20 seconds    Lower Trunk Rotation Limitations rt/lt each    Quadruped Mid Back Stretch 5 reps;30 seconds    Quadruped Mid Back Stretch Limitations cat camel with childs pose      Lumbar Exercises: Standing   Other Standing Lumbar Exercises sink squat x 10  goblet squat x 10 with 10 lb,  atttemempted hip hinge with pt trying to bend upper back and not maintaining extendied spine      Lumbar Exercises: Supine   Ab Set 10 reps    AB Set Limitations 10 secVC and TC    Pelvic Tilt 10 reps    Pelvic Tilt Limitations VC and TC    Bridge 10 reps      Lumbar Exercises: Sidelying   Clam Left;10 reps    Clam Limitations x 2 with VC for correct position    Other Sidelying Lumbar Exercises thoracic sidelying book opening 5 x on RT and the 5 on LT      Lumbar Exercises: Quadruped   Straight Leg Raise 10 reps    Straight Leg Raises Limitations x 2 with UE on elbow for stability   Pt needs cuing to lift entire range of hip extension      Modalities   Modalities Moist Heat      Moist Heat Therapy   Number Minutes Moist Heat 15 Minutes    Moist Heat Location Lumbar Spine   concurrent with exericises     Manual Therapy   Manual Therapy Soft tissue  mobilization;Myofascial release    Manual therapy comments skilled palpation with TPDN    Soft tissue mobilization STW to lumbar paraspinals and gluteal bil            Trigger Point Dry Needling - 05/14/20 0001    Consent Given? Yes    Education Handout Provided Previously provided    Muscles Treated Back/Hip Gluteus minimus;Gluteus maximus;Piriformis;Lumbar multifidi;Quadratus lumborum   bil   Other Dry Needling 75 mm .30 gage    Gluteus Minimus Response Twitch  response elicited;Palpable increased muscle length    Gluteus Maximus Response Twitch response elicited;Palpable increased muscle length    Piriformis Response Twitch response elicited;Palpable increased muscle length    Lumbar multifidi Response Twitch response elicited;Palpable increased muscle length    Quadratus Lumborum Response Twitch response elicited;Palpable increased muscle length   bil               PT Education - 05/14/20 1425    Education Details added flexibility to HEP and strength    Person(s) Educated Patient    Methods Explanation;Demonstration;Tactile cues;Verbal cues;Handout    Comprehension Verbalized understanding;Returned demonstration            PT Short Term Goals - 04/30/20 1342      PT SHORT TERM GOAL #1   Title Pt will be independent with intial HEP    Baseline I with inital HEP    Time 4    Period Weeks    Status Achieved      PT SHORT TERM GOAL #2   Title Demonstrate and verbalize techniques to reduce the risk of re-injury including: lifting, posture, body mechanics.    Baseline able to state at least 3 ways to be aware of posture or modigy chores to reduce tension in back    Time 4    Period Weeks    Status Achieved    Target Date 04/23/20      PT SHORT TERM GOAL #3   Title Pt will demonstrate 5 x STS in 13.0 sec or less to show improved LE strength    Baseline 5 x sts  13.59    Time 4    Period Weeks    Status Partially Met             PT Long Term Goals -  05/14/20 1444      PT LONG TERM GOAL #1   Title Pt will be independent with all HEP given/walking program    Baseline HEP being reinforced as well as walking program    Time 8    Period Weeks    Status On-going      PT LONG TERM GOAL #2   Title Pt will be able to drive in car 1-2 hours with pain managment techniques to prevent spasm and pain    Baseline Pt diriving today 7/10 when reaching clinic    Time 8    Period Weeks    Status On-going      PT LONG TERM GOAL #3   Title Pt will be able to rise from floor and descend safely and independently    Baseline Pt unable to perform floor transfers without help    Time 8    Period Weeks    Status Unable to assess      PT LONG TERM GOAL #4   Title Pt will ascend /descend steps with pain 2/10 or less)    Baseline Pt unable to ascend steps wthout UE support and pain greater than 5/10    Time 8    Period Weeks    Status Unable to assess      PT LONG TERM GOAL #5   Title FOTO will improve from  61% limitation  to49%     indicating improved functional mobility.    Baseline eval 61% limitation    Time 8    Period Weeks    Status Unable to assess            Access Code: [email or web address]:  [email or web address]: 07/27/2021Prepared by: Donnetta Simpers BeardsleyExercises  Supine Bridge - 1 x daily - 7 x weekly - 2 sets - 10 reps  Cat-Camel to Child's Pose - 1 x daily - 7 x weekly - 1 sets - 10 reps  Clamshell with Resistance - 1 x daily - 7 x weekly - 2 sets - 10 reps  Quadruped on Forearms Hip Extension - 1 x daily - 7 x weekly - 2 sets - 10 reps  Sidelying Open Book Thoracic Rotation with Knee on Foam Roll - 1 x daily - 7 x weekly - 2 sets - 10 reps       Plan - 05/14/20 1432    Clinical Impression Statement Pt returns to clinic with 7/10 pain today and no use of cane.  She tends to flex trunk forward and needs to emphasize HEP.  Pt did consent to TPDN to bil lumbar/SI and decreased pain to 4/10 after exericieses post TPDN.   Pt has one more scheduled appt but will need addtional appt to reinforce HEP.  She cares for autisitc son but clearly improveds with exericise.  Will continue to reinforce motion and correct movement.    Personal Factors and Comorbidities Age;Comorbidity 1;Comorbidity 2    Comorbidities Cervical spondylosis with myelopathy with cervical fusion, carpal tunnel syndrome, DM Sacroilitis, GERD  See Medical History    Examination-Activity Limitations Squat;Stairs;Stand;Caring for Others;Bed Mobility;Transfers    Examination-Participation Restrictions Cleaning;Driving;Laundry;Meal Prep    PT Frequency 1x / week    PT Duration 8 weeks    PT Treatment/Interventions ADLs/Self Care Home Management;Cryotherapy;Electrical Stimulation;Iontophoresis 17m/ml Dexamethasone;Moist Heat;Traction;Ultrasound;Therapeutic exercise;Therapeutic activities;Functional mobility training;Stair training;Gait training;Neuromuscular re-education;Patient/family education;Passive range of motion;Manual techniques;Dry needling;Taping;Joint Manipulations;Spinal Manipulations    PT Next Visit Plan t, hip hinge  add dead lift  ERO for at least 2 additional visits next visit to reinforce HEP  work on HEP alone Needs to do steps/ floor to stand xfer  lunging    PT [place]  [id number]   Consulted and Agree with Plan of Care Patient           Patient will benefit from skilled therapeutic intervention in order to improve the following deficits and impairments:  Obesity, Pain, Postural dysfunction, Improper body mechanics, Impaired flexibility, Hypermobility, Decreased strength, Decreased range of motion, Difficulty walking, Decreased mobility  Visit Diagnosis: Chronic bilateral low back pain with bilateral sciatica  Muscle weakness (generalized)  Abnormal posture  Difficulty in walking, not elsewhere classified     Problem List Patient Active Problem List   Diagnosis Date Noted  . Diabetic  peripheral neuropathy associated with type 2 diabetes mellitus ([facility] 07/24/2019  . Asthma, intermittent 11/02/2018  . Lumbar back pain with radiculopathy affecting right lower extremity 06/09/2018  . Recurrent maxillary sinusitis   . Encounter for long-term opiate analgesic use 04/01/2017  . Primary osteoarthritis of both first carpometacarpal joints 11/20/2016  . Seborrheic keratosis 01/15/2016  . Health care maintenance 11/24/2011  . SPONDYLOSIS, LUMBAR 03/29/2008  . SPONDYLOLISTHESIS 03/29/2008  . Hyperlipidemia associated with type 2 diabetes mellitus ([facility] 02/28/2007  . COLON POLYP 12/16/2006  . Type 2 diabetes mellitus with neurological complications ([facility] 025/85/2778 . OBESITY, NOS 12/16/2006  . Attention deficit disorder 12/16/2006  . Hypertension associated with diabetes ([facility] 12/16/2006  . Perennial allergic rhinitis with seasonal variation 12/16/2006  . GASTROESOPHAGEAL REFLUX, NO ESOPHAGITIS 12/16/2006  . Osteoarthritis, multiple sites 12/16/2006   LVoncille Lo PT Certified Exercise Expert for the Aging Adult  05/14/20  2:46 PM Phone: [phone] Fax: [place] [facility] [address] [place], [place], [address] Phone: [phone]   Fax:  [phone]  Name: [name] MRN: [id number] Date of Birth: Jul 12, 1951

## 2020-05-14 NOTE — Patient Instructions (Signed)
   Voncille Lo, PT Certified Exercise Expert for the Aging Adult  05/14/20 2:25 PM Phone: [phone] Fax: [phone]

## 2020-05-15 ENCOUNTER — Other Ambulatory Visit: Payer: Self-pay

## 2020-05-15 DIAGNOSIS — F988 Other specified behavioral and emotional disorders with onset usually occurring in childhood and adolescence: Secondary | ICD-10-CM

## 2020-05-15 DIAGNOSIS — M150 Primary generalized (osteo)arthritis: Secondary | ICD-10-CM

## 2020-05-15 DIAGNOSIS — M5416 Radiculopathy, lumbar region: Secondary | ICD-10-CM

## 2020-05-15 DIAGNOSIS — M47817 Spondylosis without myelopathy or radiculopathy, lumbosacral region: Secondary | ICD-10-CM

## 2020-05-15 DIAGNOSIS — Q762 Congenital spondylolisthesis: Secondary | ICD-10-CM

## 2020-05-15 DIAGNOSIS — M159 Polyosteoarthritis, unspecified: Secondary | ICD-10-CM

## 2020-05-15 DIAGNOSIS — M180 Bilateral primary osteoarthritis of first carpometacarpal joints: Secondary | ICD-10-CM

## 2020-05-16 MED ORDER — HYDROCODONE-ACETAMINOPHEN 7.5-325 MG PO TABS: 1.0000 | ORAL_TABLET | Freq: Three times a day (TID) | ORAL | 0 refills | Status: DC | PRN

## 2020-05-16 MED ORDER — METHYLPHENIDATE HCL 10 MG PO TABS: 20.0000 mg | ORAL_TABLET | Freq: Three times a day (TID) | ORAL | 0 refills | Status: DC

## 2020-05-16 NOTE — Telephone Encounter (Signed)
Patient LVM on nurse line checking status of rx refills.   Talbot Grumbling, RN

## 2020-05-21 ENCOUNTER — Other Ambulatory Visit: Payer: Self-pay

## 2020-05-21 ENCOUNTER — Ambulatory Visit: Payer: Medicare Other | Attending: Family Medicine | Admitting: Physical Therapy

## 2020-05-21 DIAGNOSIS — M5442 Lumbago with sciatica, left side: Secondary | ICD-10-CM | POA: Insufficient documentation

## 2020-05-21 DIAGNOSIS — M6281 Muscle weakness (generalized): Secondary | ICD-10-CM | POA: Diagnosis not present

## 2020-05-21 DIAGNOSIS — R293 Abnormal posture: Secondary | ICD-10-CM | POA: Diagnosis not present

## 2020-05-21 DIAGNOSIS — R262 Difficulty in walking, not elsewhere classified: Secondary | ICD-10-CM | POA: Diagnosis not present

## 2020-05-21 DIAGNOSIS — G8929 Other chronic pain: Secondary | ICD-10-CM | POA: Diagnosis not present

## 2020-05-21 DIAGNOSIS — M5441 Lumbago with sciatica, right side: Secondary | ICD-10-CM | POA: Insufficient documentation

## 2020-05-21 NOTE — Therapy (Addendum)
[place], [place], [address] Phone: [phone]   Fax:  [phone]  Physical Therapy Treatment/Recertification/Discharge Note  Patient Details  Name: [name] MRN: [id number] Date of Birth: [birth year] Referring Provider (PT): McDiarmid  Sherren Mocha MD   Encounter Date: 05/21/2020   PT End of Session - 05/21/20 1407    Visit Number 5    Number of Visits 9    Date for PT Re-Evaluation 06/18/20    Authorization Type MCR  needs progress note at 10th visit    PT Start Time 1347    PT Stop Time 1427    PT Time Calculation (min) 40 min    Activity Tolerance Patient tolerated treatment well    Behavior During Therapy [facility] for tasks assessed/performed           Past Medical History:  Diagnosis Date  . Acute MEE (middle ear effusion) 06/07/2014  . Acute recurrent sinusitis 12/21/2014  . Acute sinusitis 12/21/2014  . Anemia    hx  . ATTENTION DEFICIT, W/O HYPERACTIVITY 12/16/2006  . Bilateral carpal tunnel syndrome 11/20/2016  . Cervical spondylosis with myelopathy, History of 04/28/2012  . DIABETES MELLITUS II, UNCOMPLICATED 3/00/5110  . DIFFUSE IDIOPATHIC SKELETAL HYPEROSTOSIS 03/29/2008  . GERD (gastroesophageal reflux disease)   . H/O abnormal mammogram 07/19/2009   S/P Breast Biopsy ([place], 07/2009): Benign findings.    . 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  . HEMORRHOIDS, NOS 12/16/2006   Qualifier: History of  By: McDiarmid MD, Sherren Mocha    . History of blood transfusion 10/2004   with knee replacement  . History of peptic ulcer 12/16/2006   UGI series PUD - 10/19/1998    . History of right greater trochanteric bursitis 11/07/2009  . HYPERCHOLESTEROLEMIA 02/28/2007  . HYPERTENSION, BENIGN SYSTEMIC 12/16/2006  . Hyponatremia 08/30/2015  . INCONTINENCE, URGE 12/16/2006   Qualifier: History of  By: McDiarmid MD, Sherren Mocha    . 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/20/2016  . OSTEOARTHRITIS, MULTI  SITES 12/16/2006  . Perennial allergic rhinitis with seasonal variation 12/16/2006       . PONV (postoperative nausea and vomiting)    last surgery only  . Recurrent maxillary sinusitis   . SACROILIITIS, HISTORY OF 01/08/2009   Qualifier: History of  By: McDiarmid MD, Sherren Mocha    . Seasonal allergies   . Ulcer   . Uterine fibroid 02/02/2011   TVUS: 4cm fibroid w/ submucosal component, bil.hydrosalpinges, unable measurable endometrium - 08/18/2004     Past Surgical History:  Procedure Laterality Date  . ANTERIOR CERVICAL DECOMP/DISCECTOMY FUSION  04/28/2012   Procedure: ANTERIOR CERVICAL DECOMPRESSION/DISCECTOMY FUSION 3 LEVELS;  Surgeon: Ophelia Charter, MD;  Location: [place] [facility];  Service: Neurosurgery;  Laterality: N/A;  Cervical three-four,Cervical four-five,Cervical five-six anterior cervical decompression with fusion interbody prothesis plating and bonegraft  . BREAST BIOPSY  07/2009   S/P Breast Biopsy [facility], [place], 07/2009): Benign findings.  (10/27/2010)  . CARPAL TUNNEL RELEASE Left 12/10/2016   Procedure: LEFT CARPAL TUNNEL RELEASE;  Surgeon: Daryll Brod, MD;  Location: [place];  Service: Orthopedics;  Laterality: Left;  . COLONOSCOPY W/ POLYPECTOMY  12/2000   colonoscopy (Dr Collene Mares) int. hemorrhoids &  nonneoplatic colon polyps - 01/10/2001,   . COLONOSCOPY W/ POLYPECTOMY  01/2012   Dr Hilarie Fredrickson (GI).sigmoid colon, hyperlastic polyp  . ENDOMETRIAL BIOPSY  08/2004   Endometrial Biopsy - 09/01/2004,  . NM MYOVIEW LTD  04/2004  Cardiolite:EF63%, no ischemia - 05/01/2004,   . REPLACEMENT TOTAL KNEE BILATERAL      There were no vitals filed for this visit.   Subjective Assessment - 05/21/20 1350    Subjective I was able to stand up and cook for my son about 2 hours    Pertinent History Cervical spondylosis with myelopathy with cervical fusion, carpal tunnel syndrome, DM Sacroilitis, GERD  See Medical History    Limitations Standing;Walking    How  long can you sit comfortably? sitting an hour    How long can you stand comfortably? 30 minutes    How long can you walk comfortably? 15 minutes    Patient Stated Goals MOve better and to take care of household stuff without pain limiting cooking sweeping and laundry, cant drive in car difficulty getting in and out of car,    Currently in Pain? Yes    Pain Score 4     Pain Location Back    Pain Orientation Right;Left;Mid    Pain Descriptors / Indicators Aching;Tightness    Pain Type Chronic pain    Pain Onset More than a month ago              [facility] PT Assessment - 05/21/20 0001      Assessment   Medical Diagnosis chronic back pain    Referring Provider (PT) McDiarmid  Sherren Mocha MD    Onset Date/Surgical Date 09/26/19   gradually over time approx 6 months     Observation/Other Assessments   Focus on Therapeutic Outcomes (FOTO)  FOTO intake 73% limitation 27% predicted 49%      Squat   Comments able to squat to deadlift 10 lb each hand to simulate groceries and squat with 15 pounds box squat      Sit to Stand   Comments 5xsts 11.20 se c      Floor to Stand   Comments Pt requires min assist and UE support on chair tocome to standing      ROM / Strength   AROM / PROM / Strength AROM;Strength      AROM   Right Hip Flexion 100    Right Hip External Rotation  29    Right Hip Internal Rotation  25    Left Hip Flexion 95    Left Hip External Rotation  35    Left Hip Internal Rotation  27    Lumbar Flexion 90%   able to touch toes   Lumbar Extension 50%    Lumbar - Right Side Bend 50%    Lumbar - Left Side Bend 50%    Lumbar - Right Rotation 75%    Lumbar - Left Rotation 75%      Strength   Overall Strength Deficits    Right Hip Flexion 4/5    Right Hip Extension 4-/5    Right Hip ABduction 4-/5    Left Hip Flexion 4/5    Left Hip Extension 4-/5    Left Hip ABduction 4-/5    Right Knee Flexion 4/5    Right Knee Extension 4/5    Left Knee Flexion 4-/5    Left Knee  Extension 4/5      Ambulation/Gait   Ambulation Distance (Feet) 300 Feet    Assistive device None    Ambulation Surface Level    Gait velocity 2.33 ft/sec    Stairs Yes    Stairs Assistance 7: Independent    Stair Management Technique One rail Right;Alternating pattern  Number of Stairs 16    Height of Stairs 6    Gait Comments Pt with greater stride length now and faster walking speed                                 PT Education - 05/21/20 1447    Education Details reinforced floor to stand x fer and picking up weights in lift situations. groceries , small children.    Person(s) Educated Patient    Methods Explanation;Demonstration    Comprehension Verbalized understanding;Returned demonstration            PT Short Term Goals - 05/21/20 1436      PT SHORT TERM GOAL #1   Title Pt will be independent with intial HEP    Baseline I with inital HEP    Time 4    Period Weeks    Status Achieved      PT SHORT TERM GOAL #2   Title Demonstrate and verbalize techniques to reduce the risk of re-injury including: lifting, posture, body mechanics.    Baseline able to state at least 3 ways to be aware of posture or modigy chores to reduce tension in back    Time 4    Period Weeks    Status Achieved      PT SHORT TERM GOAL #3   Title Pt will demonstrate 5 x STS in 13.0 sec or less to show improved LE strength    Baseline 11.20 sec    Time 4    Period Weeks    Status Achieved             PT Long Term Goals - 05/21/20 1348      PT LONG TERM GOAL #1   Title Pt will be independent with all HEP given/walking program    Baseline HEP being reinforced as well as walking program    Time 8    Period Weeks    Status On-going    Target Date 06/18/20      PT LONG TERM GOAL #2   Title Pt will be able to drive in car 1-2 hours with pain managment techniques to prevent spasm and pain    Baseline Pt can drive 1 hour    Time 8    Period Weeks    Status  Partially Met    Target Date 06/18/20      PT LONG TERM GOAL #3   Title Pt will be able to rise from floor and descend safely and independently    Baseline Pt is able to rise with minimal assist and using a chair with UE assist    Time 8    Period Weeks    Status On-going    Target Date 06/18/20      PT LONG TERM GOAL #4   Title Pt will ascend /descend steps with pain 2/10 or less)    Baseline Pt unable to ascend steps wthout UE support and pain greater than now 2/3 out of 10    Time 8    Period Weeks    Status Achieved    Target Date 06/18/20      PT LONG TERM GOAL #5   Title FOTO will improve from  61% limitation  to49%     indicating improved functional mobility.    Baseline eval 61% limitation  05-21-20 27%    Time 8    Period Weeks  Status Achieved    Target Date 06/18/20                 Plan - 05/21/20 1440    Clinical Impression Statement Pt return for reevaluation and FOTO score improved from 61 %limitaiton to 27% limitation.  All STG's acheived and LTG # 4 and 5 achieved for FOTO score improvement and also able to ascedn steps with 2 to 3/10 pain.  Pt still needs min assiste to rise from floor to stand and would benefit from 4 addtional visits to reinforce HEP and strength. MMT in LE increased to 4/5 at minimum but she still has tightness in hips.  Flexibility would improve her ability to rise from ground and increase her fall preparedness  She is inconsistent with HEP and walking program and she is so close to achieving greater stregthening and would benefit from skilled PT to achieve rest of goals.    Personal Factors and Comorbidities Age;Comorbidity 1;Comorbidity 2    Comorbidities Cervical spondylosis with myelopathy with cervical fusion, carpal tunnel syndrome, DM Sacroilitis, GERD  See Medical History    Examination-Activity Limitations Squat;Stairs;Stand;Caring for Others;Bed Mobility;Transfers    Examination-Participation Restrictions  Cleaning;Driving;Laundry;Meal Prep    Stability/Clinical Decision Making Evolving/Moderate complexity    Clinical Decision Making Moderate    Rehab Potential Good    PT Frequency 1x / week    PT Duration 4 weeks    PT Treatment/Interventions ADLs/Self Care Home Management;Cryotherapy;Electrical Stimulation;Iontophoresis 90m/ml Dexamethasone;Moist Heat;Traction;Ultrasound;Therapeutic exercise;Therapeutic activities;Functional mobility training;Stair training;Gait training;Neuromuscular re-education;Patient/family education;Passive range of motion;Manual techniques;Dry needling;Taping;Joint Manipulations;Spinal Manipulations    PT Next Visit Plan t, hip hinge  add dead lift push up and floor to stand without min assist    PT Home Exercise Plan [id number]   [id number]   Consulted and Agree with Plan of Care Patient           Patient will benefit from skilled therapeutic intervention in order to improve the following deficits and impairments:  Obesity, Pain, Postural dysfunction, Improper body mechanics, Impaired flexibility, Hypermobility, Decreased strength, Decreased range of motion, Difficulty walking, Decreased mobility  Visit Diagnosis: Chronic bilateral low back pain with bilateral sciatica  Muscle weakness (generalized)  Abnormal posture  Difficulty in walking, not elsewhere classified     Problem List Patient Active Problem List   Diagnosis Date Noted  . Diabetic peripheral neuropathy associated with type 2 diabetes mellitus ([place] 07/24/2019  . Asthma, intermittent 11/02/2018  . Lumbar back pain with radiculopathy affecting right lower extremity 06/09/2018  . Recurrent maxillary sinusitis   . Encounter for long-term opiate analgesic use 04/01/2017  . Primary osteoarthritis of both first carpometacarpal joints 11/20/2016  . Seborrheic keratosis 01/15/2016  . Health care maintenance 11/24/2011  . SPONDYLOSIS, LUMBAR 03/29/2008  . SPONDYLOLISTHESIS 03/29/2008  .  Hyperlipidemia associated with type 2 diabetes mellitus ([place] 02/28/2007  . COLON POLYP 12/16/2006  . Type 2 diabetes mellitus with neurological complications ([place] 030/16/0109 . OBESITY, NOS 12/16/2006  . Attention deficit disorder 12/16/2006  . Hypertension associated with diabetes ([place] 12/16/2006  . Perennial allergic rhinitis with seasonal variation 12/16/2006  . GASTROESOPHAGEAL REFLUX, NO ESOPHAGITIS 12/16/2006  . Osteoarthritis, multiple sites 12/16/2006    LVoncille Lo PT Certified Exercise Expert for the Aging Adult  05/21/20 2:55 PM Phone: [phone]: 3[facility] [address]Phone: [phone]  Fax:  [phone] Name: Barbara KILLINGSMRN: [id number] of Birth: 103-03-1951  PHYSICAL THERAPY DISCHARGE SUMMARY  Visits from Start of Care: 5  Current functional level related to goals / functional outcomes: unknown   Remaining deficits: unknown   Education / Equipment: HEP Plan:                                                    Patient goals were partially met. Patient is being discharged due to the patient's request.  ?????    Pt is uncomfortable coming out during increase in COVID cases . Will try to come at a later time if still needed  Voncille Lo, [place], [facility] Certified Exercise Expert for the Aging Adult  07/18/20 10:05 AM Phone: [phone] Fax: [phone]

## 2020-05-28 ENCOUNTER — Ambulatory Visit: Payer: Medicare Other | Admitting: Physical Therapy

## 2020-06-04 ENCOUNTER — Ambulatory Visit: Payer: Medicare Other | Admitting: Physical Therapy

## 2020-06-05 ENCOUNTER — Other Ambulatory Visit: Payer: Self-pay | Admitting: Family Medicine

## 2020-06-05 DIAGNOSIS — M5416 Radiculopathy, lumbar region: Secondary | ICD-10-CM

## 2020-06-13 ENCOUNTER — Ambulatory Visit: Payer: Medicare Other | Admitting: Physical Therapy

## 2020-06-14 ENCOUNTER — Other Ambulatory Visit: Payer: Self-pay

## 2020-06-14 DIAGNOSIS — Q762 Congenital spondylolisthesis: Secondary | ICD-10-CM

## 2020-06-14 DIAGNOSIS — M159 Polyosteoarthritis, unspecified: Secondary | ICD-10-CM

## 2020-06-14 DIAGNOSIS — M5416 Radiculopathy, lumbar region: Secondary | ICD-10-CM

## 2020-06-14 DIAGNOSIS — M180 Bilateral primary osteoarthritis of first carpometacarpal joints: Secondary | ICD-10-CM

## 2020-06-14 DIAGNOSIS — F988 Other specified behavioral and emotional disorders with onset usually occurring in childhood and adolescence: Secondary | ICD-10-CM

## 2020-06-14 DIAGNOSIS — M47817 Spondylosis without myelopathy or radiculopathy, lumbosacral region: Secondary | ICD-10-CM

## 2020-06-14 MED ORDER — METHYLPHENIDATE HCL 10 MG PO TABS: 20.0000 mg | ORAL_TABLET | Freq: Three times a day (TID) | ORAL | 0 refills | Status: DC

## 2020-06-14 MED ORDER — HYDROCODONE-ACETAMINOPHEN 7.5-325 MG PO TABS: 1.0000 | ORAL_TABLET | Freq: Three times a day (TID) | ORAL | 0 refills | Status: DC | PRN

## 2020-07-15 ENCOUNTER — Other Ambulatory Visit: Payer: Self-pay

## 2020-07-15 DIAGNOSIS — Q762 Congenital spondylolisthesis: Secondary | ICD-10-CM

## 2020-07-15 DIAGNOSIS — M180 Bilateral primary osteoarthritis of first carpometacarpal joints: Secondary | ICD-10-CM

## 2020-07-15 DIAGNOSIS — M5416 Radiculopathy, lumbar region: Secondary | ICD-10-CM

## 2020-07-15 DIAGNOSIS — M159 Polyosteoarthritis, unspecified: Secondary | ICD-10-CM

## 2020-07-15 DIAGNOSIS — F988 Other specified behavioral and emotional disorders with onset usually occurring in childhood and adolescence: Secondary | ICD-10-CM

## 2020-07-15 DIAGNOSIS — M47817 Spondylosis without myelopathy or radiculopathy, lumbosacral region: Secondary | ICD-10-CM

## 2020-07-15 MED ORDER — HYDROCODONE-ACETAMINOPHEN 7.5-325 MG PO TABS: 1.0000 | ORAL_TABLET | Freq: Three times a day (TID) | ORAL | 0 refills | Status: DC | PRN

## 2020-07-15 MED ORDER — METHYLPHENIDATE HCL 10 MG PO TABS: 20.0000 mg | ORAL_TABLET | Freq: Three times a day (TID) | ORAL | 0 refills | Status: DC

## 2020-07-19 ENCOUNTER — Ambulatory Visit (INDEPENDENT_AMBULATORY_CARE_PROVIDER_SITE_OTHER): Payer: Medicare Other

## 2020-07-19 ENCOUNTER — Other Ambulatory Visit: Payer: Self-pay

## 2020-07-19 DIAGNOSIS — Z23 Encounter for immunization: Secondary | ICD-10-CM

## 2020-07-19 NOTE — Progress Notes (Signed)
Patient presents to nurse clinic for flu vaccination. Epic and NCIR reviewed. Administered flu vaccination in LD, site unremarkable, tolerated injection well.   Talbot Grumbling, RN

## 2020-08-12 ENCOUNTER — Other Ambulatory Visit: Payer: Self-pay

## 2020-08-12 DIAGNOSIS — M5416 Radiculopathy, lumbar region: Secondary | ICD-10-CM

## 2020-08-12 DIAGNOSIS — Q762 Congenital spondylolisthesis: Secondary | ICD-10-CM

## 2020-08-12 DIAGNOSIS — M47817 Spondylosis without myelopathy or radiculopathy, lumbosacral region: Secondary | ICD-10-CM

## 2020-08-12 DIAGNOSIS — F988 Other specified behavioral and emotional disorders with onset usually occurring in childhood and adolescence: Secondary | ICD-10-CM

## 2020-08-12 DIAGNOSIS — M180 Bilateral primary osteoarthritis of first carpometacarpal joints: Secondary | ICD-10-CM

## 2020-08-12 DIAGNOSIS — M8949 Other hypertrophic osteoarthropathy, multiple sites: Secondary | ICD-10-CM

## 2020-08-12 DIAGNOSIS — M159 Polyosteoarthritis, unspecified: Secondary | ICD-10-CM

## 2020-08-13 MED ORDER — METHYLPHENIDATE HCL 10 MG PO TABS: 20.0000 mg | ORAL_TABLET | Freq: Three times a day (TID) | ORAL | 0 refills | Status: DC

## 2020-08-13 MED ORDER — HYDROCODONE-ACETAMINOPHEN 7.5-325 MG PO TABS: 1.0000 | ORAL_TABLET | Freq: Three times a day (TID) | ORAL | 0 refills | Status: DC | PRN

## 2020-09-09 ENCOUNTER — Other Ambulatory Visit: Payer: Self-pay

## 2020-09-09 DIAGNOSIS — M180 Bilateral primary osteoarthritis of first carpometacarpal joints: Secondary | ICD-10-CM

## 2020-09-09 DIAGNOSIS — M47817 Spondylosis without myelopathy or radiculopathy, lumbosacral region: Secondary | ICD-10-CM

## 2020-09-09 DIAGNOSIS — M5416 Radiculopathy, lumbar region: Secondary | ICD-10-CM

## 2020-09-09 DIAGNOSIS — F988 Other specified behavioral and emotional disorders with onset usually occurring in childhood and adolescence: Secondary | ICD-10-CM

## 2020-09-09 DIAGNOSIS — Q762 Congenital spondylolisthesis: Secondary | ICD-10-CM

## 2020-09-09 DIAGNOSIS — M159 Polyosteoarthritis, unspecified: Secondary | ICD-10-CM

## 2020-09-09 DIAGNOSIS — M8949 Other hypertrophic osteoarthropathy, multiple sites: Secondary | ICD-10-CM

## 2020-09-10 MED ORDER — HYDROCODONE-ACETAMINOPHEN 7.5-325 MG PO TABS: 1.0000 | ORAL_TABLET | Freq: Three times a day (TID) | ORAL | 0 refills | Status: DC | PRN

## 2020-09-10 MED ORDER — METHYLPHENIDATE HCL 10 MG PO TABS: 20.0000 mg | ORAL_TABLET | Freq: Three times a day (TID) | ORAL | 0 refills | Status: DC

## 2020-10-14 ENCOUNTER — Other Ambulatory Visit: Payer: Self-pay

## 2020-10-14 DIAGNOSIS — M5416 Radiculopathy, lumbar region: Secondary | ICD-10-CM

## 2020-10-14 DIAGNOSIS — M47817 Spondylosis without myelopathy or radiculopathy, lumbosacral region: Secondary | ICD-10-CM

## 2020-10-14 DIAGNOSIS — M180 Bilateral primary osteoarthritis of first carpometacarpal joints: Secondary | ICD-10-CM

## 2020-10-14 DIAGNOSIS — Q762 Congenital spondylolisthesis: Secondary | ICD-10-CM

## 2020-10-14 DIAGNOSIS — F988 Other specified behavioral and emotional disorders with onset usually occurring in childhood and adolescence: Secondary | ICD-10-CM

## 2020-10-14 DIAGNOSIS — M159 Polyosteoarthritis, unspecified: Secondary | ICD-10-CM

## 2020-10-14 MED ORDER — METHYLPHENIDATE HCL 10 MG PO TABS: 20.0000 mg | ORAL_TABLET | Freq: Three times a day (TID) | ORAL | 0 refills | Status: DC

## 2020-10-14 MED ORDER — HYDROCODONE-ACETAMINOPHEN 7.5-325 MG PO TABS: 1.0000 | ORAL_TABLET | Freq: Three times a day (TID) | ORAL | 0 refills | Status: DC | PRN

## 2020-10-17 ENCOUNTER — Other Ambulatory Visit: Payer: Self-pay

## 2020-10-17 ENCOUNTER — Ambulatory Visit (INDEPENDENT_AMBULATORY_CARE_PROVIDER_SITE_OTHER): Payer: Medicare Other | Admitting: Family Medicine

## 2020-10-17 ENCOUNTER — Encounter: Payer: Self-pay | Admitting: Family Medicine

## 2020-10-17 VITALS — BP 138/80 | Ht 62.0 in | Wt 187.5 lb

## 2020-10-17 DIAGNOSIS — M5416 Radiculopathy, lumbar region: Secondary | ICD-10-CM | POA: Diagnosis not present

## 2020-10-17 DIAGNOSIS — Z79899 Other long term (current) drug therapy: Secondary | ICD-10-CM | POA: Diagnosis not present

## 2020-10-17 DIAGNOSIS — E1169 Type 2 diabetes mellitus with other specified complication: Secondary | ICD-10-CM | POA: Diagnosis not present

## 2020-10-17 DIAGNOSIS — R293 Abnormal posture: Secondary | ICD-10-CM | POA: Diagnosis not present

## 2020-10-17 DIAGNOSIS — E1149 Type 2 diabetes mellitus with other diabetic neurological complication: Secondary | ICD-10-CM

## 2020-10-17 DIAGNOSIS — W19XXXA Unspecified fall, initial encounter: Secondary | ICD-10-CM | POA: Diagnosis not present

## 2020-10-17 DIAGNOSIS — R2689 Other abnormalities of gait and mobility: Secondary | ICD-10-CM | POA: Diagnosis not present

## 2020-10-17 DIAGNOSIS — E1159 Type 2 diabetes mellitus with other circulatory complications: Secondary | ICD-10-CM | POA: Diagnosis not present

## 2020-10-17 DIAGNOSIS — G8929 Other chronic pain: Secondary | ICD-10-CM | POA: Diagnosis not present

## 2020-10-17 DIAGNOSIS — E1142 Type 2 diabetes mellitus with diabetic polyneuropathy: Secondary | ICD-10-CM | POA: Diagnosis not present

## 2020-10-17 DIAGNOSIS — M545 Low back pain, unspecified: Secondary | ICD-10-CM

## 2020-10-17 DIAGNOSIS — I152 Hypertension secondary to endocrine disorders: Secondary | ICD-10-CM | POA: Diagnosis not present

## 2020-10-17 DIAGNOSIS — Z79891 Long term (current) use of opiate analgesic: Secondary | ICD-10-CM

## 2020-10-17 DIAGNOSIS — F988 Other specified behavioral and emotional disorders with onset usually occurring in childhood and adolescence: Secondary | ICD-10-CM

## 2020-10-17 DIAGNOSIS — R29818 Other symptoms and signs involving the nervous system: Secondary | ICD-10-CM

## 2020-10-17 DIAGNOSIS — E785 Hyperlipidemia, unspecified: Secondary | ICD-10-CM

## 2020-10-17 LAB — POCT GLYCOSYLATED HEMOGLOBIN (HGB A1C): Hemoglobin A1C: 6.1 % — AB (ref 4.0–5.6)

## 2020-10-17 LAB — GLUCOSE, POCT (MANUAL RESULT ENTRY): POC Glucose: 147 mg/dl — AB (ref 70–99)

## 2020-10-17 MED ORDER — TIZANIDINE HCL 2 MG PO CAPS: 2.0000 mg | ORAL_CAPSULE | Freq: Three times a day (TID) | ORAL | 1 refills | Status: DC | PRN

## 2020-10-17 NOTE — Patient Instructions (Addendum)
Please stop the Robaxin as it can cause falls. Start Tizanidine for muscle relaxant  We have made a referral to [facility] to schedule an MRI of your low back.  I am concerned that you may have significant spinal stenosis causing the pain in your back and legs.    Your A1c 6.2% is great.  Keep doing what you are doing.    Your blood pressure on recheck after you have a moment to recover was 138/90 which is fine.    We are checking your kidneys today with blood work.  If you have labs (blood work) drawn today and your tests are completely normal, you will receive your results only by:  MyChart Message (if you have MyChart) OR  A paper copy in the mail If you have any lab test that is abnormal or we need to change your treatment, we will call you to review the results.   We made a referral to Physical Therapy, Garen Lah, to help you straighten up your posture.

## 2020-10-17 NOTE — Progress Notes (Addendum)
CPT E&M Office Visit Time Before Visit; reviewing medical records (e.g. recent visits, labs, studies): 5 minutes During Visit (F2F time): 20 minutes After Visit (discussion with family or HCP, prescribing, ordering, referring, calling result/recommendations or documenting on same day): 5 minutes Total Visit Time: 30 minutes  [name] is alone Sources of clinical information for visit is/are patient and past medical records. Nursing assessment for this office visit was reviewed with the patient for accuracy and revision.     Previous Report(s) Reviewed: historical medical records, lab reports, office notes and radiology reports  Depression screen [facility] 2/9 10/17/2020  Decreased Interest 0  Down, Depressed, Hopeless 0  PHQ - 2 Score 0  Altered sleeping 0  Tired, decreased energy 1  Change in appetite 1  Feeling bad or failure about yourself  0  Trouble concentrating 0  Moving slowly or fidgety/restless 0  Suicidal thoughts 0  PHQ-9 Score 2  Difficult doing work/chores Somewhat difficult  Some recent data might be hidden    Fall Risk  10/17/2020 03/07/2020 07/20/2019 05/11/2019 06/23/2018  Falls in the past year? 0 1 0 0 Yes  Number falls in past yr: 0 0 - - 2 or more  Injury with Fall? 0 0 - - No    PHQ9 SCORE ONLY 10/17/2020 03/07/2020 11/09/2019  PHQ-9 Total Score 2 0 0    Adult vaccines due  Topic Date Due  . TETANUS/TDAP  03/03/2022    Health Maintenance Due  Topic Date Due  . MAMMOGRAM  10/15/2018  . FOOT EXAM  05/10/2020      History/P.E. limitations: none  Adult vaccines due  Topic Date Due  . TETANUS/TDAP  03/03/2022    Diabetes Health Maintenance Due  Topic Date Due  . FOOT EXAM  05/10/2020  . OPHTHALMOLOGY EXAM  11/09/2020  . HEMOGLOBIN A1C  04/17/2021  . LIPID PANEL  11/08/2024    Health Maintenance Due  Topic Date Due  . MAMMOGRAM  10/15/2018  . FOOT EXAM  05/10/2020     Chief Complaint  Patient presents with  . Follow-up    39mo f/up  .  Diabetes  . Fall    Diabetic Foot Exam - Simple   Simple Foot Form Diabetic Foot exam was performed with the following findings: Yes 10/17/2020  4:00 PM  Visual Inspection No deformities, no ulcerations, no other skin breakdown bilaterally: Yes Sensation Testing Intact to touch and monofilament testing bilaterally: Yes Pulse Check See comments: Yes Comments Unable palpate DP P nor PTpulse, but good CR < 3 sec, no evidence of ischemia.

## 2020-10-18 ENCOUNTER — Encounter: Payer: Self-pay | Admitting: Family Medicine

## 2020-10-18 DIAGNOSIS — W19XXXA Unspecified fall, initial encounter: Secondary | ICD-10-CM | POA: Insufficient documentation

## 2020-10-18 HISTORY — DX: Unspecified fall, initial encounter: W19.XXXA

## 2020-10-18 LAB — BASIC METABOLIC PANEL
BUN/Creatinine Ratio: 17 (ref 12–28)
BUN: 11 mg/dL (ref 8–27)
CO2: 23 mmol/L (ref 20–29)
Calcium: 10 mg/dL (ref 8.7–10.3)
Chloride: 101 mmol/L (ref 96–106)
Creatinine, Ser: 0.66 mg/dL (ref 0.57–1.00)
GFR calc Af Amer: 104 mL/min/{1.73_m2} (ref >59)
GFR calc non Af Amer: 90 mL/min/{1.73_m2} (ref >59)
Glucose: 125 mg/dL — ABNORMAL HIGH (ref 65–99)
Potassium: 3.7 mmol/L (ref 3.5–5.2)
Sodium: 139 mmol/L (ref 134–144)

## 2020-10-18 NOTE — Assessment & Plan Note (Signed)
Lab Results  Component Value Date   LDLCALC 102 (H) 11/09/2019   Established problem Well Controlled. No signs of complications, medication side effects, or red flags. Continue current medications and other regiments.

## 2020-10-18 NOTE — Assessment & Plan Note (Signed)
Established problem. °Helps patient sustain attention and complete tasks necessary for herself and her family °Stable. °Continue current therapy °PDMP db reviewed.  No aberrant behaviors noted.  °

## 2020-10-18 NOTE — Assessment & Plan Note (Addendum)
Established problem Primarily numbness in whole feet, bilaterally, left > right.  Occasional burning nor sharp foot pains.  Gabapentin available prn.

## 2020-10-18 NOTE — Assessment & Plan Note (Signed)
Acute Event Happened in [facility] office while walking to [facility] just prior to appointment with physician Denies syncope, vertigo, lightheadedness Was using quad cane at time, she thought she may have become entangled with it. She felt herself going forward and could not stop herself.  She struck her face, primarily nose, central forehead, upper lip and chin against floor.  She was assisted by [employer] who heard pt fall.  At DC, CNA, Clabe Seal, noted left shoe flopping around foot, almost causing patient to trip.   No decreased loc, no disorientation. No nose bleed, no immediate change in skin color, no immediate lumps on face No fluid from ears, no change in retroauricle skin No teeth broken Internal external hip rotation without pain Chronic tenderness over left greater trochanter no worse than usual  DTRs 0 knees, Right ankle +1, left ankle 0 Strength 5/5 bilateral lower extremities except at left ankle 4/5 dorsiflexion 3-Stage Balance test: side-by-side passed with eyes open and shut;  Semi-tandem:passed; Tandem: failed Intact Monorilaments 5/5 biolaterally, but poor proprioception left grt toe.  Gait: for flexion at hips, head forward, short strides with heel just passing toes bilaterally.   Assessment Fall - Likely multifactorial, possible most proximal cause is ill -fitting shoe on left foot that may have decrease strength of dorsiflexsion (? Left foot drag?)               - Other contributing causes: Excess flexion posture, impaired balance, back pain with possible l. Stneosis, peripheral neuropathy with proprioceptive loss, Robaxin, poor foot wear.  Plan - PT referral for gait, posture, balance issues.  - Lumbar MRI to look for spinal stenosis and possible left L5 nerve central compression - Address shoe fitting - Stop Robaxin.  Replace with Tizanidine.

## 2020-10-18 NOTE — Assessment & Plan Note (Signed)
BACK PAIN Acute or gradual onset: Chronic pain in low back for years that worsened this year, treated fairly successfully with several PT sessions in late summer-early fall period.  Gradual return of pain in midline low back with radiation bilaterally around back into front of thighs No recall of recent traumatic event nor unusual activities.  Quality: Aching, almost constant pain when awake.    Worse with: position, lifting and walking - improvement with rest  Trauma/repetitive stress: no Better sitting or leaning forward: yes Bladder symptoms: no  PMH of back pain: recurrent self limited episodes of low back pain in the past, previous osteoarthritis of lumbar spine and History of ACDF  [facility] Hx:  FINDINGS 2019 Lumbar XR: Mild convex left scoliosis with the apex at L2-3 appears slightly worse than on the prior exam. There is straightening of the normal lumbar lordosis. Trace anterolisthesis L4 on L5 due to facet arthropathy is unchanged. Marked loss of disc space height with endplate spurring and vacuum disc phenomenon from L2-S1 appears slightly worse than on the prior examination. No acute abnormality. Paraspinous structures demonstrate aortic atherosclerosis.  IMPRESSION: Mild progression of convex left lumbar scoliosis and severe multilevel degenerative disc disease since the comparison examination.  Red Flags  Fecal/urinary incontinence: no  High energy trauma: no  Numbness/Weakness: yes, chronic diabetic neuropathy  Fever/chills/sweats: no  Night pain: no  Unexplained weight loss: no  No relief with bedrest: no  h/o cancer/immunosuppression: no  IV drug use: no  PMH of osteoporosis or chronic steroid use: no   Exam significant for  DTR absent knees, present right ankle, not left ankle Normal lower extremity strengths except left ankle with 4/5   Ddx Multifactorial etiology suspected      - Degenerative disc disease      - Lumbar osteoarthrtis       - Muscle strain with excessive flexion posture      - Scoliosis      - Concern for possible significant Lumbar Stenosis with progressive neurologic impairment         Plan  NSAID: Naprosyn BID prn, Vicodin available prn MRI of the affected area due to presence of focal neurologic exam left lower extremity (ankle) and concern for significant spinal stenosis given pain pattern and forward gait flexion. Heat to area and Referred to PT, Garen Lah at [facility] PT [place] muscle relaxers: but stop robaxin and start tizanidine

## 2020-10-18 NOTE — Assessment & Plan Note (Signed)
Pain mostly in low back, bilateral knees. No evidence of aberrant use on PDMP review Analgesia helps with completion of ADLs and iADLs No significant adverse effects

## 2020-10-18 NOTE — Assessment & Plan Note (Signed)
Lab Results  Component Value Date   HGBA1C 6.1 (A) 10/17/2020  Established problem Well Controlled. No signs of complications, medication side effects, or red flags. Continue metformin er 500 mg BID

## 2020-10-18 NOTE — Assessment & Plan Note (Signed)
Established problem. Stable. Controlled Standing BP 138/80 - No dizziness with standing Continue current medications and other regiments.

## 2020-11-10 ENCOUNTER — Ambulatory Visit
Admission: RE | Admit: 2020-11-10 | Discharge: 2020-11-10 | Disposition: A | Discharge status: Home / Self Care | Payer: Medicare Other | Source: Ambulatory Visit | Attending: Family Medicine | Admitting: Family Medicine

## 2020-11-10 ENCOUNTER — Other Ambulatory Visit: Payer: Self-pay

## 2020-11-10 DIAGNOSIS — M545 Low back pain, unspecified: Secondary | ICD-10-CM

## 2020-11-10 DIAGNOSIS — G8929 Other chronic pain: Secondary | ICD-10-CM

## 2020-11-10 DIAGNOSIS — R29818 Other symptoms and signs involving the nervous system: Secondary | ICD-10-CM

## 2020-11-10 DIAGNOSIS — M48061 Spinal stenosis, lumbar region without neurogenic claudication: Secondary | ICD-10-CM | POA: Diagnosis not present

## 2020-11-11 ENCOUNTER — Other Ambulatory Visit: Payer: Self-pay

## 2020-11-11 ENCOUNTER — Other Ambulatory Visit: Payer: Self-pay | Admitting: Family Medicine

## 2020-11-11 ENCOUNTER — Telehealth: Payer: Self-pay | Admitting: Family Medicine

## 2020-11-11 DIAGNOSIS — F988 Other specified behavioral and emotional disorders with onset usually occurring in childhood and adolescence: Secondary | ICD-10-CM

## 2020-11-11 DIAGNOSIS — M8949 Other hypertrophic osteoarthropathy, multiple sites: Secondary | ICD-10-CM

## 2020-11-11 DIAGNOSIS — M5416 Radiculopathy, lumbar region: Secondary | ICD-10-CM

## 2020-11-11 DIAGNOSIS — Q762 Congenital spondylolisthesis: Secondary | ICD-10-CM

## 2020-11-11 DIAGNOSIS — M48062 Spinal stenosis, lumbar region with neurogenic claudication: Secondary | ICD-10-CM

## 2020-11-11 DIAGNOSIS — M180 Bilateral primary osteoarthritis of first carpometacarpal joints: Secondary | ICD-10-CM

## 2020-11-11 DIAGNOSIS — M159 Polyosteoarthritis, unspecified: Secondary | ICD-10-CM

## 2020-11-11 DIAGNOSIS — M47817 Spondylosis without myelopathy or radiculopathy, lumbosacral region: Secondary | ICD-10-CM

## 2020-11-11 MED ORDER — METHYLPHENIDATE HCL 10 MG PO TABS: 20.0000 mg | ORAL_TABLET | Freq: Three times a day (TID) | ORAL | 0 refills | Status: DC

## 2020-11-11 MED ORDER — HYDROCODONE-ACETAMINOPHEN 7.5-325 MG PO TABS: 1.0000 | ORAL_TABLET | Freq: Three times a day (TID) | ORAL | 0 refills | Status: DC | PRN

## 2020-11-11 NOTE — Telephone Encounter (Signed)
I spoke with Ms [name]. Wediscussed the results of her Lumbar MRI from 11/10/20. 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   We agreed that consultation with her neurosurgeon, Dr Orinda Kenner, would be appropriate to see if he believe the spinal canal stenosis is contributing significantly to her symptoms, and if so, what [name] therapy options would be.   Referral to Dr Arnoldo Morale (Neurosurg) was placed.

## 2020-11-12 ENCOUNTER — Ambulatory Visit: Payer: Medicare Other | Admitting: Physical Therapy

## 2020-12-13 ENCOUNTER — Other Ambulatory Visit: Payer: Self-pay

## 2020-12-13 DIAGNOSIS — Q762 Congenital spondylolisthesis: Secondary | ICD-10-CM

## 2020-12-13 DIAGNOSIS — M159 Polyosteoarthritis, unspecified: Secondary | ICD-10-CM

## 2020-12-13 DIAGNOSIS — M5416 Radiculopathy, lumbar region: Secondary | ICD-10-CM

## 2020-12-13 DIAGNOSIS — M8949 Other hypertrophic osteoarthropathy, multiple sites: Secondary | ICD-10-CM

## 2020-12-13 DIAGNOSIS — F988 Other specified behavioral and emotional disorders with onset usually occurring in childhood and adolescence: Secondary | ICD-10-CM

## 2020-12-13 DIAGNOSIS — M180 Bilateral primary osteoarthritis of first carpometacarpal joints: Secondary | ICD-10-CM

## 2020-12-13 DIAGNOSIS — M47817 Spondylosis without myelopathy or radiculopathy, lumbosacral region: Secondary | ICD-10-CM

## 2020-12-13 MED ORDER — HYDROCODONE-ACETAMINOPHEN 7.5-325 MG PO TABS: 1.0000 | ORAL_TABLET | Freq: Three times a day (TID) | ORAL | 0 refills | Status: DC | PRN

## 2020-12-13 MED ORDER — METHYLPHENIDATE HCL 10 MG PO TABS: 20.0000 mg | ORAL_TABLET | Freq: Three times a day (TID) | ORAL | 0 refills | Status: DC

## 2021-01-13 ENCOUNTER — Other Ambulatory Visit: Payer: Self-pay

## 2021-01-13 DIAGNOSIS — M8949 Other hypertrophic osteoarthropathy, multiple sites: Secondary | ICD-10-CM

## 2021-01-13 DIAGNOSIS — M5416 Radiculopathy, lumbar region: Secondary | ICD-10-CM

## 2021-01-13 DIAGNOSIS — M47817 Spondylosis without myelopathy or radiculopathy, lumbosacral region: Secondary | ICD-10-CM

## 2021-01-13 DIAGNOSIS — M159 Polyosteoarthritis, unspecified: Secondary | ICD-10-CM

## 2021-01-13 DIAGNOSIS — Q762 Congenital spondylolisthesis: Secondary | ICD-10-CM

## 2021-01-13 DIAGNOSIS — F988 Other specified behavioral and emotional disorders with onset usually occurring in childhood and adolescence: Secondary | ICD-10-CM

## 2021-01-13 DIAGNOSIS — M180 Bilateral primary osteoarthritis of first carpometacarpal joints: Secondary | ICD-10-CM

## 2021-01-13 MED ORDER — METHYLPHENIDATE HCL 10 MG PO TABS: 20.0000 mg | ORAL_TABLET | Freq: Three times a day (TID) | ORAL | 0 refills | Status: DC

## 2021-01-13 MED ORDER — HYDROCODONE-ACETAMINOPHEN 7.5-325 MG PO TABS: 1.0000 | ORAL_TABLET | Freq: Three times a day (TID) | ORAL | 0 refills | Status: DC | PRN

## 2021-02-12 ENCOUNTER — Other Ambulatory Visit: Payer: Self-pay

## 2021-02-12 DIAGNOSIS — M8949 Other hypertrophic osteoarthropathy, multiple sites: Secondary | ICD-10-CM

## 2021-02-12 DIAGNOSIS — Q762 Congenital spondylolisthesis: Secondary | ICD-10-CM

## 2021-02-12 DIAGNOSIS — M47817 Spondylosis without myelopathy or radiculopathy, lumbosacral region: Secondary | ICD-10-CM

## 2021-02-12 DIAGNOSIS — M159 Polyosteoarthritis, unspecified: Secondary | ICD-10-CM

## 2021-02-12 DIAGNOSIS — M5416 Radiculopathy, lumbar region: Secondary | ICD-10-CM

## 2021-02-12 DIAGNOSIS — F988 Other specified behavioral and emotional disorders with onset usually occurring in childhood and adolescence: Secondary | ICD-10-CM

## 2021-02-12 DIAGNOSIS — M180 Bilateral primary osteoarthritis of first carpometacarpal joints: Secondary | ICD-10-CM

## 2021-02-12 MED ORDER — METHYLPHENIDATE HCL 10 MG PO TABS: 20.0000 mg | ORAL_TABLET | Freq: Three times a day (TID) | ORAL | 0 refills | Status: DC

## 2021-02-12 MED ORDER — HYDROCODONE-ACETAMINOPHEN 7.5-325 MG PO TABS
1.0000 | ORAL_TABLET | Freq: Three times a day (TID) | ORAL | 0 refills | Status: DC | PRN
Start: 2021-02-12 — End: 2021-03-14

## 2021-02-14 NOTE — Addendum Note (Signed)
Addended by: Talbot Grumbling on: 02/14/2021 04:05 PM   Modules accepted: Orders

## 2021-02-14 NOTE — Telephone Encounter (Signed)
Patient calls nurse line regarding issues with picking up methylphenidate rx. Called pharmacy. Pharmacist reports that rx was accidentally voided and new rx will need to be sent over. Pharmacy will be closed over the weekend. Patient reports that she has enough to get her through until Monday morning.   Please advise.   Talbot Grumbling, RN

## 2021-02-17 MED ORDER — NAPROXEN 500 MG PO TABS
ORAL_TABLET | ORAL | 3 refills | Status: DC
Start: 2021-02-17 — End: 2021-05-05

## 2021-03-14 ENCOUNTER — Other Ambulatory Visit: Payer: Self-pay

## 2021-03-14 DIAGNOSIS — F988 Other specified behavioral and emotional disorders with onset usually occurring in childhood and adolescence: Secondary | ICD-10-CM

## 2021-03-14 DIAGNOSIS — M159 Polyosteoarthritis, unspecified: Secondary | ICD-10-CM

## 2021-03-14 DIAGNOSIS — M5416 Radiculopathy, lumbar region: Secondary | ICD-10-CM

## 2021-03-14 DIAGNOSIS — M180 Bilateral primary osteoarthritis of first carpometacarpal joints: Secondary | ICD-10-CM

## 2021-03-14 DIAGNOSIS — M150 Primary generalized (osteo)arthritis: Secondary | ICD-10-CM

## 2021-03-14 DIAGNOSIS — Q762 Congenital spondylolisthesis: Secondary | ICD-10-CM

## 2021-03-14 DIAGNOSIS — M47817 Spondylosis without myelopathy or radiculopathy, lumbosacral region: Secondary | ICD-10-CM

## 2021-03-14 DIAGNOSIS — M8949 Other hypertrophic osteoarthropathy, multiple sites: Secondary | ICD-10-CM

## 2021-03-14 MED ORDER — HYDROCODONE-ACETAMINOPHEN 7.5-325 MG PO TABS: 1.0000 | ORAL_TABLET | Freq: Three times a day (TID) | ORAL | 0 refills | Status: DC | PRN

## 2021-03-14 MED ORDER — METHYLPHENIDATE HCL 10 MG PO TABS: 20.0000 mg | ORAL_TABLET | Freq: Three times a day (TID) | ORAL | 0 refills | Status: DC

## 2021-03-27 ENCOUNTER — Other Ambulatory Visit: Payer: Self-pay

## 2021-03-27 ENCOUNTER — Ambulatory Visit (INDEPENDENT_AMBULATORY_CARE_PROVIDER_SITE_OTHER): Payer: Medicare Other | Admitting: Family Medicine

## 2021-03-27 ENCOUNTER — Encounter: Payer: Self-pay | Admitting: Family Medicine

## 2021-03-27 VITALS — BP 132/64 | HR 100 | Wt 189.0 lb

## 2021-03-27 DIAGNOSIS — Z0000 Encounter for general adult medical examination without abnormal findings: Secondary | ICD-10-CM

## 2021-03-27 DIAGNOSIS — E1142 Type 2 diabetes mellitus with diabetic polyneuropathy: Secondary | ICD-10-CM | POA: Diagnosis not present

## 2021-03-27 DIAGNOSIS — M48062 Spinal stenosis, lumbar region with neurogenic claudication: Secondary | ICD-10-CM

## 2021-03-27 DIAGNOSIS — I152 Hypertension secondary to endocrine disorders: Secondary | ICD-10-CM | POA: Diagnosis not present

## 2021-03-27 DIAGNOSIS — E1149 Type 2 diabetes mellitus with other diabetic neurological complication: Secondary | ICD-10-CM

## 2021-03-27 DIAGNOSIS — E1159 Type 2 diabetes mellitus with other circulatory complications: Secondary | ICD-10-CM | POA: Diagnosis not present

## 2021-03-27 DIAGNOSIS — M48061 Spinal stenosis, lumbar region without neurogenic claudication: Secondary | ICD-10-CM | POA: Insufficient documentation

## 2021-03-27 LAB — POCT GLYCOSYLATED HEMOGLOBIN (HGB A1C): HbA1c, POC (controlled diabetic range): 6.3 % (ref 0.0–7.0)

## 2021-03-27 NOTE — Patient Instructions (Signed)
Your A1c was excellent.  No changes recommended.   Blood pressure looks good.    It is time for your mammogram and eye exam.

## 2021-03-28 ENCOUNTER — Encounter: Payer: Self-pay | Admitting: Family Medicine

## 2021-03-28 NOTE — Assessment & Plan Note (Signed)
Established problem Well Controlled. No signs of complications, medication side effects, or red flags. Continue current medications and other regiments.  

## 2021-03-28 NOTE — Progress Notes (Signed)
[name] is accompanied by patient and granddgt (child) Sources of clinical information for visit is/are patient and past medical records. Nursing assessment for this office visit was reviewed with the patient for accuracy and revision.     Previous Report(s) Reviewed: lab reports and office notes  Depression screen [facility] 2/9 10/17/2020  Decreased Interest 0  Down, Depressed, Hopeless 0  PHQ - 2 Score 0  Altered sleeping 0  Tired, decreased energy 1  Change in appetite 1  Feeling bad or failure about yourself  0  Trouble concentrating 0  Moving slowly or fidgety/restless 0  Suicidal thoughts 0  PHQ-9 Score 2  Difficult doing work/chores Somewhat difficult  Some recent data might be hidden    Fall Risk  10/17/2020 03/07/2020 07/20/2019 05/11/2019 06/23/2018  Falls in the past year? 0 1 0 0 Yes  Number falls in past yr: 0 0 - - 2 or more  Injury with Fall? 0 0 - - No    PHQ9 SCORE ONLY 10/17/2020 03/07/2020 11/09/2019  PHQ-9 Total Score 2 0 0    Adult vaccines due  Topic Date Due   TETANUS/TDAP  03/03/2022    Health Maintenance Due  Topic Date Due   Zoster Vaccines- Shingrix (1 of 2) Never done   MAMMOGRAM  10/15/2018   COVID-19 Vaccine (4 - Booster for Moderna series) 11/02/2020   OPHTHALMOLOGY EXAM  11/09/2020      History/P.E. limitations: none  Adult vaccines due  Topic Date Due   TETANUS/TDAP  03/03/2022    Diabetes Health Maintenance Due  Topic Date Due   OPHTHALMOLOGY EXAM  11/09/2020   HEMOGLOBIN A1C  09/26/2021   FOOT EXAM  10/17/2021   LIPID PANEL  11/08/2024    Health Maintenance Due  Topic Date Due   Zoster Vaccines- Shingrix (1 of 2) Never done   MAMMOGRAM  10/15/2018   COVID-19 Vaccine (4 - Booster for Moderna series) 11/02/2020   OPHTHALMOLOGY EXAM  11/09/2020     Chief Complaint  Patient presents with   Diabetes

## 2021-03-28 NOTE — Assessment & Plan Note (Addendum)
Established problem Waxing and waning pain. No pain into legs.  Norco therapy allows patient to complete her ADLs and iADLs. No evidence of aberrant behaviors.  No complications of Norco.  Plan to continue current therapy.

## 2021-03-28 NOTE — Assessment & Plan Note (Addendum)
Discussed that it is time for diabetic eye exam by ophthalmology and time for mammogram.  Discussed Shingrix vaccination which she can obtain from pharmacy

## 2021-04-01 ENCOUNTER — Other Ambulatory Visit: Payer: Self-pay | Admitting: Family Medicine

## 2021-04-01 DIAGNOSIS — I10 Essential (primary) hypertension: Secondary | ICD-10-CM

## 2021-04-01 DIAGNOSIS — E119 Type 2 diabetes mellitus without complications: Secondary | ICD-10-CM

## 2021-04-14 ENCOUNTER — Other Ambulatory Visit: Payer: Self-pay

## 2021-04-14 DIAGNOSIS — M47817 Spondylosis without myelopathy or radiculopathy, lumbosacral region: Secondary | ICD-10-CM

## 2021-04-14 DIAGNOSIS — F988 Other specified behavioral and emotional disorders with onset usually occurring in childhood and adolescence: Secondary | ICD-10-CM

## 2021-04-14 DIAGNOSIS — M5416 Radiculopathy, lumbar region: Secondary | ICD-10-CM

## 2021-04-14 DIAGNOSIS — M159 Polyosteoarthritis, unspecified: Secondary | ICD-10-CM

## 2021-04-14 DIAGNOSIS — M180 Bilateral primary osteoarthritis of first carpometacarpal joints: Secondary | ICD-10-CM

## 2021-04-14 DIAGNOSIS — Q762 Congenital spondylolisthesis: Secondary | ICD-10-CM

## 2021-04-15 MED ORDER — METHYLPHENIDATE HCL 10 MG PO TABS: 20.0000 mg | ORAL_TABLET | Freq: Three times a day (TID) | ORAL | 0 refills | Status: DC

## 2021-04-15 MED ORDER — HYDROCODONE-ACETAMINOPHEN 7.5-325 MG PO TABS: 1.0000 | ORAL_TABLET | Freq: Three times a day (TID) | ORAL | 0 refills | Status: DC | PRN

## 2021-05-05 ENCOUNTER — Other Ambulatory Visit: Payer: Self-pay | Admitting: Family Medicine

## 2021-05-05 DIAGNOSIS — Q762 Congenital spondylolisthesis: Secondary | ICD-10-CM

## 2021-05-05 DIAGNOSIS — M47817 Spondylosis without myelopathy or radiculopathy, lumbosacral region: Secondary | ICD-10-CM

## 2021-05-14 ENCOUNTER — Other Ambulatory Visit: Payer: Self-pay | Admitting: Family Medicine

## 2021-05-14 DIAGNOSIS — M5416 Radiculopathy, lumbar region: Secondary | ICD-10-CM

## 2021-05-14 DIAGNOSIS — E1149 Type 2 diabetes mellitus with other diabetic neurological complication: Secondary | ICD-10-CM

## 2021-05-15 ENCOUNTER — Other Ambulatory Visit: Payer: Self-pay | Admitting: Family Medicine

## 2021-05-15 DIAGNOSIS — M5416 Radiculopathy, lumbar region: Secondary | ICD-10-CM

## 2021-05-15 DIAGNOSIS — M180 Bilateral primary osteoarthritis of first carpometacarpal joints: Secondary | ICD-10-CM

## 2021-05-15 DIAGNOSIS — F988 Other specified behavioral and emotional disorders with onset usually occurring in childhood and adolescence: Secondary | ICD-10-CM

## 2021-05-15 DIAGNOSIS — M8949 Other hypertrophic osteoarthropathy, multiple sites: Secondary | ICD-10-CM

## 2021-05-15 DIAGNOSIS — M47817 Spondylosis without myelopathy or radiculopathy, lumbosacral region: Secondary | ICD-10-CM

## 2021-05-15 DIAGNOSIS — M159 Polyosteoarthritis, unspecified: Secondary | ICD-10-CM

## 2021-05-15 DIAGNOSIS — Q762 Congenital spondylolisthesis: Secondary | ICD-10-CM

## 2021-05-15 MED ORDER — METHYLPHENIDATE HCL 10 MG PO TABS: 20.0000 mg | ORAL_TABLET | Freq: Three times a day (TID) | ORAL | 0 refills | Status: DC

## 2021-05-15 MED ORDER — HYDROCODONE-ACETAMINOPHEN 7.5-325 MG PO TABS: 1.0000 | ORAL_TABLET | Freq: Three times a day (TID) | ORAL | 0 refills | Status: DC | PRN

## 2021-06-10 IMAGING — MR MR LUMBAR SPINE W/O CM
4 of 5 series · 17 of 48 positions shown · non-contrast
Comparison: Prior radiograph from 05/21/2010.

CLINICAL DATA: Initial evaluation for chronic lower back pain with
left leg pain and numbness, worsening.

EXAM:
MRI LUMBAR SPINE WITHOUT CONTRAST
TECHNIQUE: Multiplanar, multisequence MR imaging of the lumbar spine was
performed. No intravenous contrast was administered.

[Series 6: T2 · sagittal · 4.0mm · 0.73mm/px · 6 of 18 slices shown (1 of 2)]
[im 1/18]
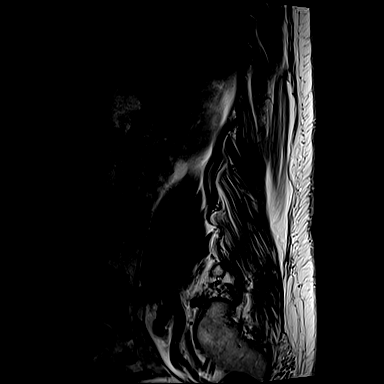
[im 4/18]
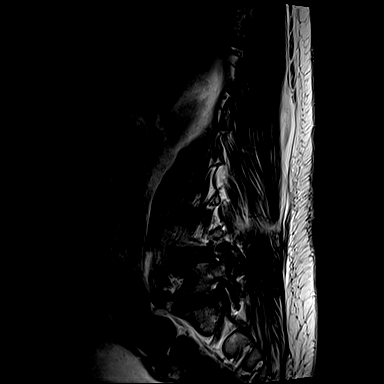
[im 7/18]
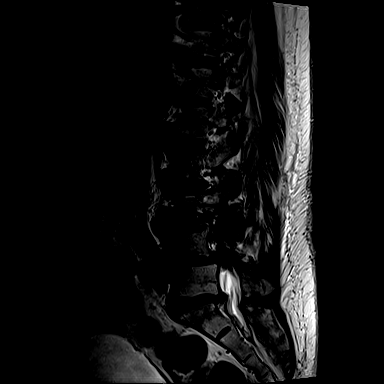
[im 11/18]
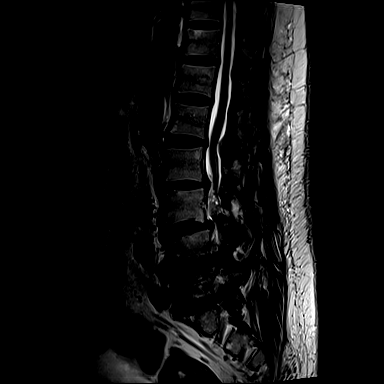
[im 14/18]
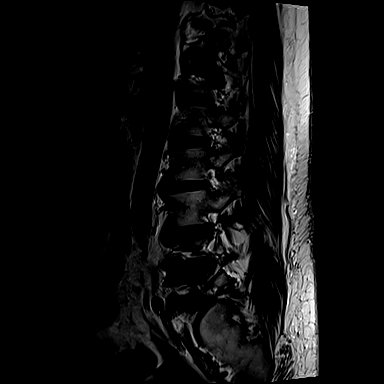
[im 18/18]
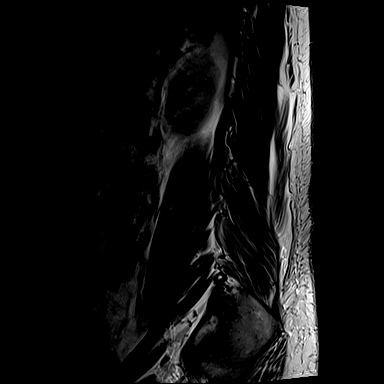

[Series 7: T1 · sagittal · 4.0mm · 0.73mm/px · 3 of 18 slices shown (1 of 2)]
[im 3/18]
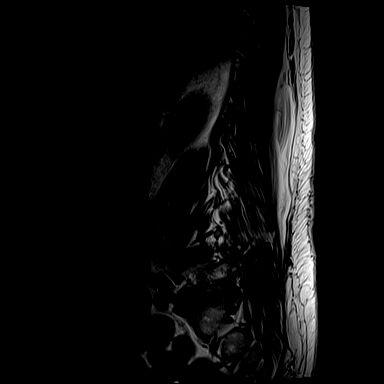
[im 9/18]
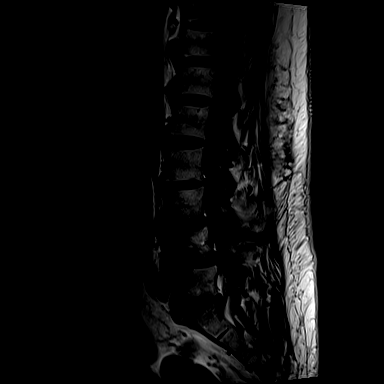
[im 15/18]
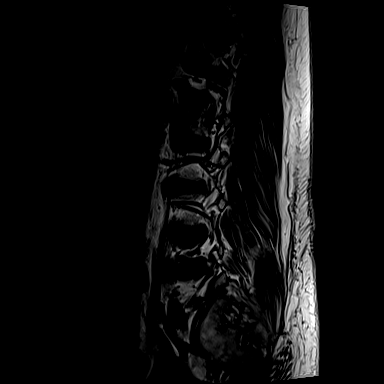

[Series 13: T2 · axial · 4.0mm · 0.28mm/px · z∈[-53,+96]mm · 5 of 36 slices shown (2 of 2)]
[im 1/36]
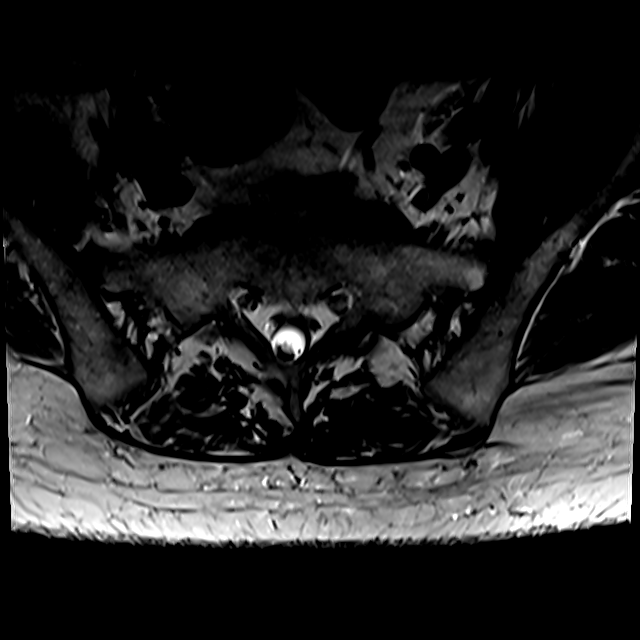
[im 6/36]
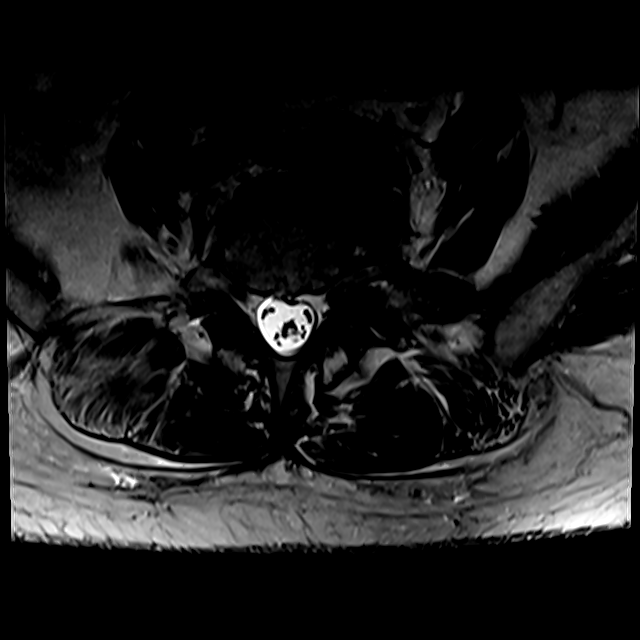
[im 11/36]
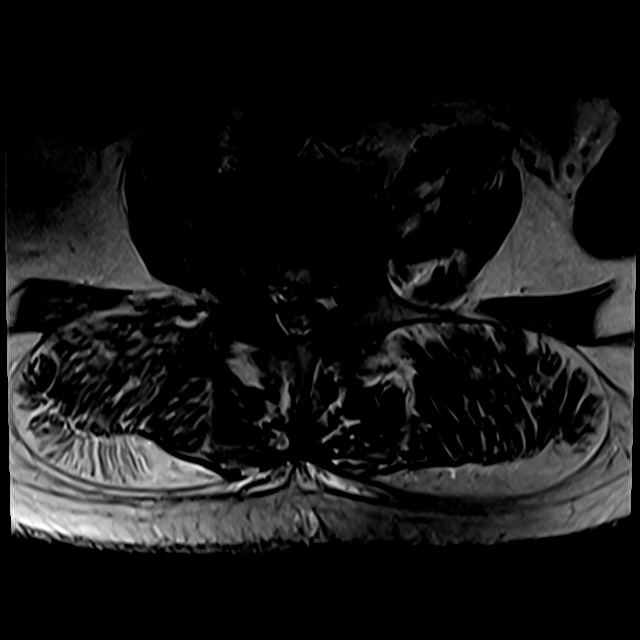
[im 19/36]
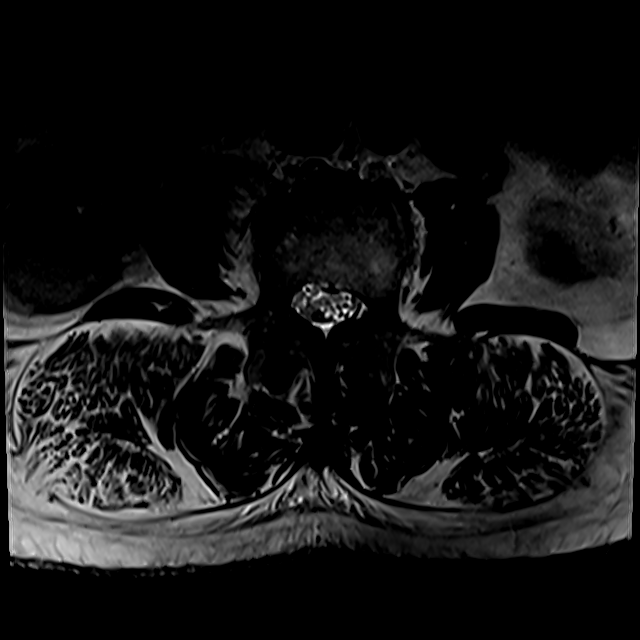
[im 30/36]
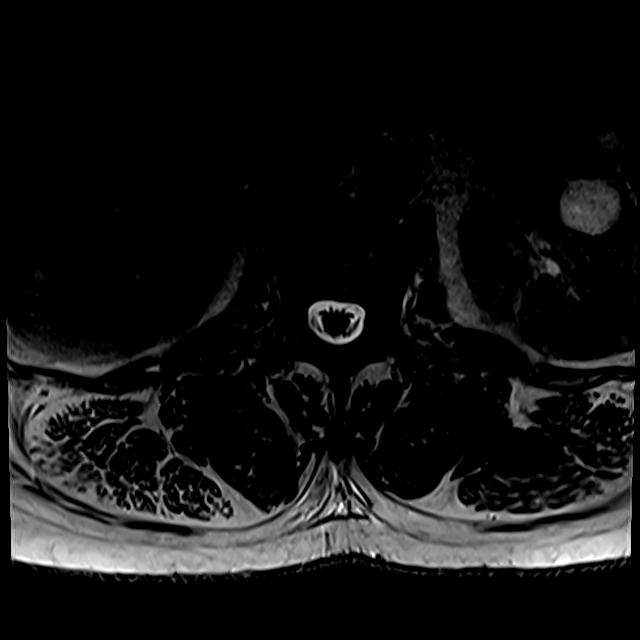

[Series 100: T1 · axial · 4.0mm · 0.28mm/px · z∈[-28,+96]mm · 3 of 36 slices shown (2 of 2)]
[im 6/36]
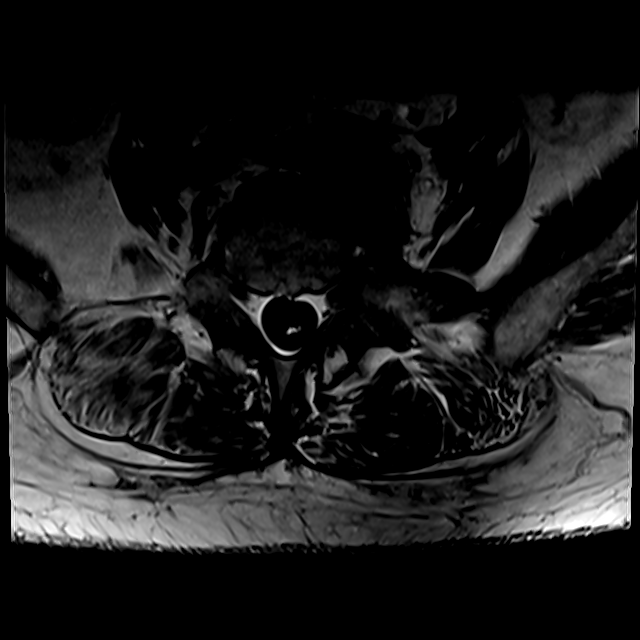
[im 19/36]
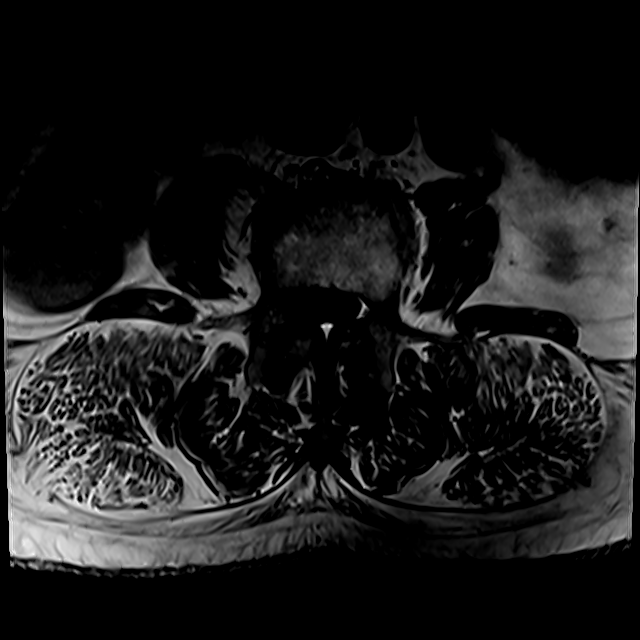
[im 30/36]
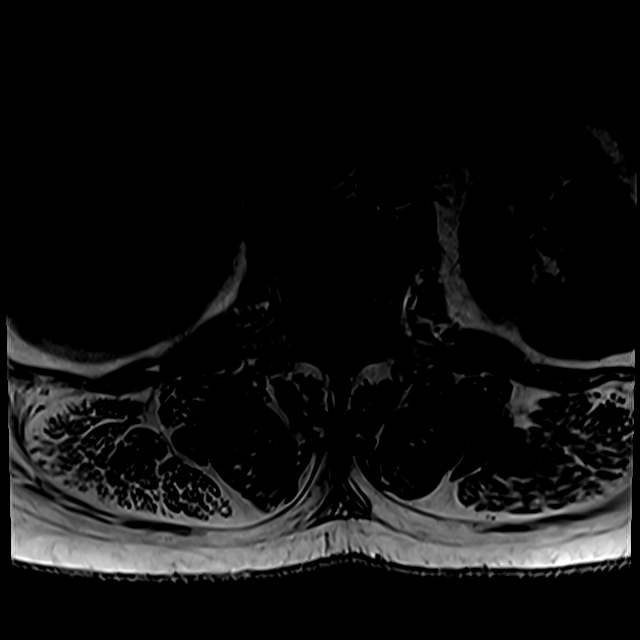

[17 of 48 positions shown; findings below may reference images not displayed]

FINDINGS: Segmentation: Standard. Lowest well-formed disc space labeled the
L5-S1 level.

Alignment: Levoscoliosis with straightening of the normal lumbar
lordosis. Trace 2 mm anterolisthesis of L2 on L3, with 3 mm
anterolisthesis of L3 on L4, and 4 mm anterolisthesis of L4 on L5. 4
mm retrolisthesis of L5 on S1. Findings chronic and facet mediated.

Vertebrae: Mild degenerative height loss present at the L4 vertebral
body. Vertebral body height otherwise maintained. No acute or
chronic fracture. Bone marrow signal intensity within normal limits.
No discrete or worrisome osseous lesions. Prominent reactive
endplate change present about the L3-4 through L5-S1 interspaces. No
other abnormal marrow edema.

Conus medullaris and cauda equina: Conus extends to the superior
aspect of L3. Conus medullaris within normal limits. Note made of a
Filar lipoma at the distal thecal sac (series 100, image 34).

Paraspinal and other soft tissues: Paraspinous soft tissues within
normal limits. Few simple cysts noted about the kidneys bilaterally.
Visualized visceral structures otherwise unremarkable.

Disc levels:

T12-L1: Minimal disc bulge. Mild right greater than left facet
hypertrophy. No canal or foraminal stenosis.

L1-2: Mild disc bulge. Moderate facet and ligament flavum
hypertrophy. Resultant mild spinal stenosis. Foramina remain patent.

L2-3: Trace anterolisthesis. Diffuse disc bulge with disc
desiccation and intervertebral disc space narrowing. Superimposed
small central to right paracentral disc protrusion indents the
ventral thecal sac (series 13, image 15). Moderate bilateral facet
hypertrophy. Resultant mild canal with moderate right lateral recess
stenosis. Foramina remain patent.

L3-4: 3 mm anterolisthesis. Diffuse disc bulge with disc desiccation
and intervertebral disc space narrowing. Prominent reactive endplate
spurring, most pronounced on the right. Severe bilateral facet
arthrosis. Resultant severe spinal stenosis, with the thecal sac
measuring 4 mm in AP diameter at its most narrow point. Moderate
right with mild left L3 foraminal narrowing.

L4-5: 4 mm anterolisthesis. Diffuse disc bulge with disc desiccation
and intervertebral disc space narrowing. Prominent reactive endplate
change with osteophytic endplate spurring, most pronounced on the
left. Severe bilateral facet arthrosis with ligament flavum
hypertrophy, also slightly worse on the left. Resultant severe
spinal stenosis. Moderate to severe left worse than right L4
foraminal narrowing.

L5-S1: 4 mm retrolisthesis. Diffuse disc bulge with disc desiccation
and intervertebral disc space narrowing. Associated discogenic
reactive endplate change with marginal endplate osteophytic
spurring. Moderate left worse than right facet hypertrophy. Trace
joint effusion on the right. No significant spinal stenosis. Severe
left greater than right L5 foraminal narrowing.
IMPRESSION: 1. Multifactorial degenerative changes at L3-4 and L4-5 with
resultant severe spinal stenosis, with moderate to severe right L3
and bilateral L4 foraminal stenosis as above.
2. Chronic degenerative disc bulge with facet hypertrophy at L5-S1
with resultant severe left greater than right L5 foraminal stenosis.
3. Disc bulge with facet hypertrophy at L2-3 with resultant mild
canal and moderate right lateral recess stenosis.
4. Incidental filar lipoma at the distal thecal sac, with conus
medullaris terminating at the superior aspect of L3.

## 2021-06-11 ENCOUNTER — Other Ambulatory Visit: Payer: Self-pay

## 2021-06-11 DIAGNOSIS — F988 Other specified behavioral and emotional disorders with onset usually occurring in childhood and adolescence: Secondary | ICD-10-CM

## 2021-06-11 DIAGNOSIS — M5416 Radiculopathy, lumbar region: Secondary | ICD-10-CM

## 2021-06-11 DIAGNOSIS — Q762 Congenital spondylolisthesis: Secondary | ICD-10-CM

## 2021-06-11 DIAGNOSIS — M180 Bilateral primary osteoarthritis of first carpometacarpal joints: Secondary | ICD-10-CM

## 2021-06-11 DIAGNOSIS — M47817 Spondylosis without myelopathy or radiculopathy, lumbosacral region: Secondary | ICD-10-CM

## 2021-06-11 DIAGNOSIS — M8949 Other hypertrophic osteoarthropathy, multiple sites: Secondary | ICD-10-CM

## 2021-06-11 DIAGNOSIS — M159 Polyosteoarthritis, unspecified: Secondary | ICD-10-CM

## 2021-06-12 MED ORDER — HYDROCODONE-ACETAMINOPHEN 7.5-325 MG PO TABS: 1.0000 | ORAL_TABLET | Freq: Three times a day (TID) | ORAL | 0 refills | Status: DC | PRN

## 2021-06-12 MED ORDER — METHYLPHENIDATE HCL 10 MG PO TABS: 20.0000 mg | ORAL_TABLET | Freq: Three times a day (TID) | ORAL | 0 refills | Status: DC

## 2021-06-27 ENCOUNTER — Other Ambulatory Visit: Payer: Self-pay | Admitting: Family Medicine

## 2021-06-27 DIAGNOSIS — M5416 Radiculopathy, lumbar region: Secondary | ICD-10-CM

## 2021-07-14 ENCOUNTER — Other Ambulatory Visit: Payer: Self-pay

## 2021-07-14 DIAGNOSIS — M47817 Spondylosis without myelopathy or radiculopathy, lumbosacral region: Secondary | ICD-10-CM

## 2021-07-14 DIAGNOSIS — M5416 Radiculopathy, lumbar region: Secondary | ICD-10-CM

## 2021-07-14 DIAGNOSIS — Q762 Congenital spondylolisthesis: Secondary | ICD-10-CM

## 2021-07-14 DIAGNOSIS — M180 Bilateral primary osteoarthritis of first carpometacarpal joints: Secondary | ICD-10-CM

## 2021-07-14 DIAGNOSIS — M159 Polyosteoarthritis, unspecified: Secondary | ICD-10-CM

## 2021-07-14 DIAGNOSIS — M8949 Other hypertrophic osteoarthropathy, multiple sites: Secondary | ICD-10-CM

## 2021-07-14 DIAGNOSIS — F988 Other specified behavioral and emotional disorders with onset usually occurring in childhood and adolescence: Secondary | ICD-10-CM

## 2021-07-15 MED ORDER — HYDROCODONE-ACETAMINOPHEN 7.5-325 MG PO TABS: 1.0000 | ORAL_TABLET | Freq: Three times a day (TID) | ORAL | 0 refills | Status: DC | PRN

## 2021-07-15 MED ORDER — METHYLPHENIDATE HCL 10 MG PO TABS: 20.0000 mg | ORAL_TABLET | Freq: Three times a day (TID) | ORAL | 0 refills | Status: DC

## 2021-08-04 ENCOUNTER — Other Ambulatory Visit: Payer: Self-pay

## 2021-08-04 ENCOUNTER — Ambulatory Visit (INDEPENDENT_AMBULATORY_CARE_PROVIDER_SITE_OTHER): Payer: Medicare Other

## 2021-08-04 DIAGNOSIS — Z23 Encounter for immunization: Secondary | ICD-10-CM | POA: Diagnosis not present

## 2021-08-12 ENCOUNTER — Other Ambulatory Visit: Payer: Self-pay

## 2021-08-12 DIAGNOSIS — M5416 Radiculopathy, lumbar region: Secondary | ICD-10-CM

## 2021-08-12 DIAGNOSIS — M47817 Spondylosis without myelopathy or radiculopathy, lumbosacral region: Secondary | ICD-10-CM

## 2021-08-12 DIAGNOSIS — Q762 Congenital spondylolisthesis: Secondary | ICD-10-CM

## 2021-08-12 DIAGNOSIS — F988 Other specified behavioral and emotional disorders with onset usually occurring in childhood and adolescence: Secondary | ICD-10-CM

## 2021-08-12 DIAGNOSIS — M159 Polyosteoarthritis, unspecified: Secondary | ICD-10-CM

## 2021-08-12 DIAGNOSIS — M180 Bilateral primary osteoarthritis of first carpometacarpal joints: Secondary | ICD-10-CM

## 2021-08-13 MED ORDER — METHYLPHENIDATE HCL 10 MG PO TABS: 20.0000 mg | ORAL_TABLET | Freq: Three times a day (TID) | ORAL | 0 refills | Status: DC

## 2021-08-13 MED ORDER — HYDROCODONE-ACETAMINOPHEN 7.5-325 MG PO TABS: 1.0000 | ORAL_TABLET | Freq: Three times a day (TID) | ORAL | 0 refills | Status: DC | PRN

## 2021-09-10 ENCOUNTER — Other Ambulatory Visit: Payer: Self-pay

## 2021-09-10 DIAGNOSIS — M5416 Radiculopathy, lumbar region: Secondary | ICD-10-CM

## 2021-09-10 DIAGNOSIS — M47817 Spondylosis without myelopathy or radiculopathy, lumbosacral region: Secondary | ICD-10-CM

## 2021-09-10 DIAGNOSIS — M159 Polyosteoarthritis, unspecified: Secondary | ICD-10-CM

## 2021-09-10 DIAGNOSIS — M180 Bilateral primary osteoarthritis of first carpometacarpal joints: Secondary | ICD-10-CM

## 2021-09-10 DIAGNOSIS — F988 Other specified behavioral and emotional disorders with onset usually occurring in childhood and adolescence: Secondary | ICD-10-CM

## 2021-09-10 DIAGNOSIS — Q762 Congenital spondylolisthesis: Secondary | ICD-10-CM

## 2021-09-13 MED ORDER — METHYLPHENIDATE HCL 10 MG PO TABS: 20.0000 mg | ORAL_TABLET | Freq: Three times a day (TID) | ORAL | 0 refills | Status: DC

## 2021-09-13 MED ORDER — HYDROCODONE-ACETAMINOPHEN 7.5-325 MG PO TABS: 1.0000 | ORAL_TABLET | Freq: Three times a day (TID) | ORAL | 0 refills | Status: DC | PRN

## 2021-10-15 ENCOUNTER — Other Ambulatory Visit: Payer: Self-pay

## 2021-10-15 DIAGNOSIS — M5416 Radiculopathy, lumbar region: Secondary | ICD-10-CM

## 2021-10-15 DIAGNOSIS — F988 Other specified behavioral and emotional disorders with onset usually occurring in childhood and adolescence: Secondary | ICD-10-CM

## 2021-10-15 DIAGNOSIS — M180 Bilateral primary osteoarthritis of first carpometacarpal joints: Secondary | ICD-10-CM

## 2021-10-15 DIAGNOSIS — M159 Polyosteoarthritis, unspecified: Secondary | ICD-10-CM

## 2021-10-15 DIAGNOSIS — Q762 Congenital spondylolisthesis: Secondary | ICD-10-CM

## 2021-10-15 DIAGNOSIS — M47817 Spondylosis without myelopathy or radiculopathy, lumbosacral region: Secondary | ICD-10-CM

## 2021-10-15 MED ORDER — METHYLPHENIDATE HCL 10 MG PO TABS: 20.0000 mg | ORAL_TABLET | Freq: Three times a day (TID) | ORAL | 0 refills | Status: DC

## 2021-10-15 MED ORDER — HYDROCODONE-ACETAMINOPHEN 7.5-325 MG PO TABS: 1.0000 | ORAL_TABLET | Freq: Three times a day (TID) | ORAL | 0 refills | Status: DC | PRN

## 2021-10-16 ENCOUNTER — Ambulatory Visit: Payer: Medicare Other | Admitting: Family Medicine

## 2021-11-11 ENCOUNTER — Other Ambulatory Visit: Payer: Self-pay

## 2021-11-11 DIAGNOSIS — Q762 Congenital spondylolisthesis: Secondary | ICD-10-CM

## 2021-11-11 DIAGNOSIS — M180 Bilateral primary osteoarthritis of first carpometacarpal joints: Secondary | ICD-10-CM

## 2021-11-11 DIAGNOSIS — F988 Other specified behavioral and emotional disorders with onset usually occurring in childhood and adolescence: Secondary | ICD-10-CM

## 2021-11-11 DIAGNOSIS — M5416 Radiculopathy, lumbar region: Secondary | ICD-10-CM

## 2021-11-11 DIAGNOSIS — M47817 Spondylosis without myelopathy or radiculopathy, lumbosacral region: Secondary | ICD-10-CM

## 2021-11-11 DIAGNOSIS — M159 Polyosteoarthritis, unspecified: Secondary | ICD-10-CM

## 2021-11-11 MED ORDER — METHYLPHENIDATE HCL 10 MG PO TABS: 20.0000 mg | ORAL_TABLET | Freq: Three times a day (TID) | ORAL | 0 refills | Status: DC

## 2021-11-11 MED ORDER — HYDROCODONE-ACETAMINOPHEN 7.5-325 MG PO TABS: 1.0000 | ORAL_TABLET | Freq: Three times a day (TID) | ORAL | 0 refills | Status: DC | PRN

## 2021-11-13 ENCOUNTER — Encounter: Payer: Self-pay | Admitting: Family Medicine

## 2021-11-13 ENCOUNTER — Other Ambulatory Visit: Payer: Self-pay

## 2021-11-13 ENCOUNTER — Ambulatory Visit (INDEPENDENT_AMBULATORY_CARE_PROVIDER_SITE_OTHER): Payer: Medicare Other | Admitting: Family Medicine

## 2021-11-13 VITALS — BP 152/71 | HR 127 | Ht 62.0 in | Wt 198.6 lb

## 2021-11-13 DIAGNOSIS — F988 Other specified behavioral and emotional disorders with onset usually occurring in childhood and adolescence: Secondary | ICD-10-CM | POA: Diagnosis not present

## 2021-11-13 DIAGNOSIS — M5416 Radiculopathy, lumbar region: Secondary | ICD-10-CM | POA: Diagnosis not present

## 2021-11-13 DIAGNOSIS — E1169 Type 2 diabetes mellitus with other specified complication: Secondary | ICD-10-CM

## 2021-11-13 DIAGNOSIS — K219 Gastro-esophageal reflux disease without esophagitis: Secondary | ICD-10-CM | POA: Diagnosis not present

## 2021-11-13 DIAGNOSIS — E785 Hyperlipidemia, unspecified: Secondary | ICD-10-CM | POA: Diagnosis not present

## 2021-11-13 DIAGNOSIS — Z79891 Long term (current) use of opiate analgesic: Secondary | ICD-10-CM

## 2021-11-13 DIAGNOSIS — Z79899 Other long term (current) drug therapy: Secondary | ICD-10-CM

## 2021-11-13 DIAGNOSIS — M48062 Spinal stenosis, lumbar region with neurogenic claudication: Secondary | ICD-10-CM | POA: Diagnosis not present

## 2021-11-13 DIAGNOSIS — E1159 Type 2 diabetes mellitus with other circulatory complications: Secondary | ICD-10-CM | POA: Diagnosis not present

## 2021-11-13 DIAGNOSIS — E1142 Type 2 diabetes mellitus with diabetic polyneuropathy: Secondary | ICD-10-CM | POA: Diagnosis not present

## 2021-11-13 DIAGNOSIS — E1149 Type 2 diabetes mellitus with other diabetic neurological complication: Secondary | ICD-10-CM | POA: Diagnosis not present

## 2021-11-13 DIAGNOSIS — Z1231 Encounter for screening mammogram for malignant neoplasm of breast: Secondary | ICD-10-CM

## 2021-11-13 DIAGNOSIS — I152 Hypertension secondary to endocrine disorders: Secondary | ICD-10-CM

## 2021-11-13 DIAGNOSIS — Z0000 Encounter for general adult medical examination without abnormal findings: Secondary | ICD-10-CM | POA: Diagnosis not present

## 2021-11-13 LAB — POCT GLYCOSYLATED HEMOGLOBIN (HGB A1C): HbA1c, POC (controlled diabetic range): 7.2 % — AB (ref 0.0–7.0)

## 2021-11-13 MED ORDER — HYDROCHLOROTHIAZIDE 25 MG PO TABS: 25.0000 mg | ORAL_TABLET | Freq: Every day | ORAL | 3 refills | Status: DC

## 2021-11-13 MED ORDER — METFORMIN HCL ER 500 MG PO TB24: ORAL_TABLET | ORAL | 3 refills | Status: DC

## 2021-11-13 NOTE — Patient Instructions (Addendum)
YOur A1c is 7.3%.  This is up from 6.3% back in June. I agree with increasing your Metformin to three tablets a day.    A referral was sent to the [facility] physicians to see what they can do to help with your back pain, like injections.    Please start the HCTZ to help with your blood pressure and leg swelling.    We are checking your kidneys and electrolytes today with blood tests.    Please go for your Mammogram at [facility]. [place].    Dr Talin Rozeboom would like to see you back in 3 months to see how your A1c is doing.

## 2021-11-14 ENCOUNTER — Encounter: Payer: Self-pay | Admitting: Family Medicine

## 2021-11-14 DIAGNOSIS — Z0000 Encounter for general adult medical examination without abnormal findings: Secondary | ICD-10-CM | POA: Insufficient documentation

## 2021-11-14 LAB — BASIC METABOLIC PANEL
BUN/Creatinine Ratio: 20 (ref 12–28)
BUN: 16 mg/dL (ref 8–27)
CO2: 20 mmol/L (ref 20–29)
Calcium: 9.9 mg/dL (ref 8.7–10.3)
Chloride: 103 mmol/L (ref 96–106)
Creatinine, Ser: 0.81 mg/dL (ref 0.57–1.00)
Glucose: 157 mg/dL — ABNORMAL HIGH (ref 70–99)
Potassium: 4 mmol/L (ref 3.5–5.2)
Sodium: 144 mmol/L (ref 134–144)
eGFR: 77 mL/min/{1.73_m2} (ref >59)

## 2021-11-14 NOTE — Progress Notes (Signed)
[name] is alone Sources of clinical information for visit is/are patient. Nursing assessment for this office visit was reviewed with the patient for accuracy and revision.     Previous Report(s) Reviewed: none  Depression screen PHQ 2/9 11/13/2021  Decreased Interest 0  Down, Depressed, Hopeless 0  PHQ - 2 Score 0  Altered sleeping 0  Tired, decreased energy 1  Change in appetite 0  Feeling bad or failure about yourself  0  Trouble concentrating 0  Moving slowly or fidgety/restless -  Suicidal thoughts 0  PHQ-9 Score 1  Difficult doing work/chores Not difficult at all  Some recent data might be hidden   [employer] Visit from 11/13/2021 in [place] Office Visit from 10/17/2020 in [place]  Thoughts that you would be better off dead, or of hurting yourself in some way Not at all Not at all  PHQ-9 Total Score 1 2       Fall Risk  11/13/2021 10/17/2020 03/07/2020 07/20/2019 05/11/2019  Falls in the past year? 0 0 1 0 0  Number falls in past yr: 0 0 0 - -  Injury with Fall? 0 0 0 - -    PHQ9 SCORE ONLY 11/13/2021 10/17/2020 03/07/2020  PHQ-9 Total Score 1 2 0    Adult vaccines due  Topic Date Due   TETANUS/TDAP  03/03/2022    Health Maintenance Due  Topic Date Due   Zoster Vaccines- Shingrix (1 of 2) Never done   MAMMOGRAM  10/15/2018   OPHTHALMOLOGY EXAM  11/09/2020   COVID-19 Vaccine (5 - Booster for Moderna series) 09/24/2021   FOOT EXAM  10/17/2021      History/P.E. limitations: none  Adult vaccines due  Topic Date Due   TETANUS/TDAP  03/03/2022    Diabetes Health Maintenance Due  Topic Date Due   OPHTHALMOLOGY EXAM  11/09/2020   FOOT EXAM  10/17/2021   HEMOGLOBIN A1C  05/13/2022   LIPID PANEL  11/08/2024    Health Maintenance Due  Topic Date Due   Zoster Vaccines- Shingrix (1 of 2) Never done   MAMMOGRAM  10/15/2018   OPHTHALMOLOGY EXAM  11/09/2020   COVID-19 Vaccine (5 - Booster for  Moderna series) 09/24/2021   FOOT EXAM  10/17/2021     Chief Complaint  Patient presents with   Follow-up

## 2021-11-14 NOTE — Assessment & Plan Note (Signed)
Established problem Well Controlled. No signs of complications, medication side effects, or red flags. Continuegabapentin 100 mg three times a day for paresthesias in feet and hands.

## 2021-11-14 NOTE — Assessment & Plan Note (Signed)
Lab Results  Component Value Date   HGBA1C 7.2 (A) 11/13/2021  Up from 6.3% in June 2022. Wt is up near 10 pounds from June Inactivity from back pain. Diet has not been good.  P/ Increase metformin to 1500 mg daily.  RETURN TO CLINIC 3 months for recheck

## 2021-11-14 NOTE — Assessment & Plan Note (Signed)
Established problem Well Controlled. No signs of complications, medication side effects, or red flags. Continue current medications and other regiments.  

## 2021-11-14 NOTE — Assessment & Plan Note (Signed)
Rx for screening mammography given to [facility] to take to [facility] at [facility].

## 2021-11-14 NOTE — Assessment & Plan Note (Signed)
Established problem. Helps patient sustain attention and complete tasks necessary for herself and her family Stable. Continue current therapy PDMP db reviewed. No aberrant behaviors noted.

## 2021-11-14 NOTE — Assessment & Plan Note (Signed)
Established problem that has improved.  [name] is no longer using the omeprazole, using only TUMS when she needs them.

## 2021-11-14 NOTE — Assessment & Plan Note (Signed)
Pain mostly in low back, anterior thighs and bilateral knees. No evidence of aberrant use on PDMP review Analgesia helps with completion of ADLs and iADLs No significant adverse effects

## 2021-11-14 NOTE — Assessment & Plan Note (Signed)
Established problem. Stable.  No signs of complications, medication side effects, or red flags. Continue current medications and other regiments.  

## 2021-11-14 NOTE — Assessment & Plan Note (Signed)
Established problem worsened.  Walking with great flexion at waist.  Pain in lumbar with radiation into front of both thighs While Norco gives adequate pain relief to allow her to perform her ADLs and iADLs,  Standing and walking activities are requiring more effort.  She wishes to avoid surgical intervention. She would like to consult with PM&R to see if they can provide intervention(s) to allow her to be more active.  Referal sent to [facility] PM[facility]R

## 2021-11-21 ENCOUNTER — Encounter: Payer: Self-pay | Admitting: Physical Medicine & Rehabilitation

## 2021-12-12 ENCOUNTER — Other Ambulatory Visit: Payer: Self-pay

## 2021-12-12 DIAGNOSIS — M47817 Spondylosis without myelopathy or radiculopathy, lumbosacral region: Secondary | ICD-10-CM

## 2021-12-12 DIAGNOSIS — F988 Other specified behavioral and emotional disorders with onset usually occurring in childhood and adolescence: Secondary | ICD-10-CM

## 2021-12-12 DIAGNOSIS — M180 Bilateral primary osteoarthritis of first carpometacarpal joints: Secondary | ICD-10-CM

## 2021-12-12 DIAGNOSIS — M5416 Radiculopathy, lumbar region: Secondary | ICD-10-CM

## 2021-12-12 DIAGNOSIS — M159 Polyosteoarthritis, unspecified: Secondary | ICD-10-CM

## 2021-12-12 DIAGNOSIS — Q762 Congenital spondylolisthesis: Secondary | ICD-10-CM

## 2021-12-13 MED ORDER — METHYLPHENIDATE HCL 10 MG PO TABS: 20.0000 mg | ORAL_TABLET | Freq: Three times a day (TID) | ORAL | 0 refills | Status: DC

## 2021-12-13 MED ORDER — HYDROCODONE-ACETAMINOPHEN 7.5-325 MG PO TABS: 1.0000 | ORAL_TABLET | Freq: Three times a day (TID) | ORAL | 0 refills | Status: DC | PRN

## 2022-01-06 ENCOUNTER — Encounter: Payer: Medicare Other | Admitting: Physical Medicine & Rehabilitation

## 2022-01-12 ENCOUNTER — Other Ambulatory Visit: Payer: Self-pay

## 2022-01-12 DIAGNOSIS — M159 Polyosteoarthritis, unspecified: Secondary | ICD-10-CM

## 2022-01-12 DIAGNOSIS — F988 Other specified behavioral and emotional disorders with onset usually occurring in childhood and adolescence: Secondary | ICD-10-CM

## 2022-01-12 DIAGNOSIS — M5416 Radiculopathy, lumbar region: Secondary | ICD-10-CM

## 2022-01-12 DIAGNOSIS — M180 Bilateral primary osteoarthritis of first carpometacarpal joints: Secondary | ICD-10-CM

## 2022-01-12 DIAGNOSIS — Q762 Congenital spondylolisthesis: Secondary | ICD-10-CM

## 2022-01-12 DIAGNOSIS — M47817 Spondylosis without myelopathy or radiculopathy, lumbosacral region: Secondary | ICD-10-CM

## 2022-01-12 MED ORDER — HYDROCODONE-ACETAMINOPHEN 7.5-325 MG PO TABS: 1.0000 | ORAL_TABLET | Freq: Three times a day (TID) | ORAL | 0 refills | Status: DC | PRN

## 2022-01-12 MED ORDER — METHYLPHENIDATE HCL 10 MG PO TABS: 20.0000 mg | ORAL_TABLET | Freq: Three times a day (TID) | ORAL | 0 refills | Status: DC

## 2022-01-16 ENCOUNTER — Telehealth: Payer: Self-pay

## 2022-01-16 NOTE — Telephone Encounter (Signed)
Patient contacted to schedule mammogram.  ? ?RE: Mobile Mammo event located at: ? ?[email or web address]  ?      ?[address]    ?[place] [address]    ? ?Date: April 7th  ?Unable to lvm for patient.  ? ?

## 2022-02-12 ENCOUNTER — Other Ambulatory Visit: Payer: Self-pay

## 2022-02-12 DIAGNOSIS — Q762 Congenital spondylolisthesis: Secondary | ICD-10-CM

## 2022-02-12 DIAGNOSIS — M159 Polyosteoarthritis, unspecified: Secondary | ICD-10-CM

## 2022-02-12 DIAGNOSIS — M180 Bilateral primary osteoarthritis of first carpometacarpal joints: Secondary | ICD-10-CM

## 2022-02-12 DIAGNOSIS — F988 Other specified behavioral and emotional disorders with onset usually occurring in childhood and adolescence: Secondary | ICD-10-CM

## 2022-02-12 DIAGNOSIS — M5416 Radiculopathy, lumbar region: Secondary | ICD-10-CM

## 2022-02-12 DIAGNOSIS — M47817 Spondylosis without myelopathy or radiculopathy, lumbosacral region: Secondary | ICD-10-CM

## 2022-02-13 MED ORDER — METHYLPHENIDATE HCL 10 MG PO TABS: 20.0000 mg | ORAL_TABLET | Freq: Three times a day (TID) | ORAL | 0 refills | Status: DC

## 2022-02-13 MED ORDER — HYDROCODONE-ACETAMINOPHEN 7.5-325 MG PO TABS
1.0000 | ORAL_TABLET | Freq: Three times a day (TID) | ORAL | 0 refills | Status: DC | PRN
Start: 2022-02-13 — End: 2022-03-09

## 2022-03-10 ENCOUNTER — Encounter: Payer: Medicare Other | Attending: Physical Medicine & Rehabilitation | Admitting: Physical Medicine & Rehabilitation

## 2022-03-10 ENCOUNTER — Encounter: Payer: Self-pay | Admitting: Physical Medicine & Rehabilitation

## 2022-03-10 VITALS — BP 111/72 | HR 104 | Wt 185.0 lb

## 2022-03-10 DIAGNOSIS — M47817 Spondylosis without myelopathy or radiculopathy, lumbosacral region: Secondary | ICD-10-CM | POA: Diagnosis not present

## 2022-03-10 DIAGNOSIS — Q762 Congenital spondylolisthesis: Secondary | ICD-10-CM | POA: Diagnosis not present

## 2022-03-10 NOTE — Patient Instructions (Signed)
Back Exercises These exercises help to make your trunk and back strong. They also help to keep the lower back flexible. Doing these exercises can help to prevent or lessen pain in your lower back. If you have back pain, try to do these exercises 2-3 times each day or as told by your doctor. As you get better, do the exercises once each day. Repeat the exercises more often as told by your doctor. To stop back pain from coming back, do the exercises once each day, or as told by your doctor. Do exercises exactly as told by your doctor. Stop right away if you feel sudden pain or your pain gets worse. Exercises Single knee to chest Do these steps 3-5 times in a row for each leg: Lie on your back on a firm bed or the floor with your legs stretched out. Bring one knee to your chest. Grab your knee or thigh with both hands and hold it in place. Pull on your knee until you feel a gentle stretch in your lower back or butt. Keep doing the stretch for 10-30 seconds. Slowly let go of your leg and straighten it. Pelvic tilt Do these steps 5-10 times in a row: Lie on your back on a firm bed or the floor with your legs stretched out. Bend your knees so they point up to the ceiling. Your feet should be flat on the floor. Tighten your lower belly (abdomen) muscles to press your lower back against the floor. This will make your tailbone point up to the ceiling instead of pointing down to your feet or the floor. Stay in this position for 5-10 seconds while you gently tighten your muscles and breathe evenly. Cat-cow Do these steps until your lower back bends more easily: Get on your hands and knees on a firm bed or the floor. Keep your hands under your shoulders, and keep your knees under your hips. You may put padding under your knees. Let your head hang down toward your chest. Tighten (contract) the muscles in your belly. Point your tailbone toward the floor so your lower back becomes rounded like the back of a  cat. Stay in this position for 5 seconds. Slowly lift your head. Let the muscles of your belly relax. Point your tailbone up toward the ceiling so your back forms a sagging arch like the back of a cow. Stay in this position for 5 seconds.  Press-ups Do these steps 5-10 times in a row: Lie on your belly (face-down) on a firm bed or the floor. Place your hands near your head, about shoulder-width apart. While you keep your back relaxed and keep your hips on the floor, slowly straighten your arms to raise the top half of your body and lift your shoulders. Do not use your back muscles. You may change where you place your hands to make yourself more comfortable. Stay in this position for 5 seconds. Keep your back relaxed. Slowly return to lying flat on the floor.  Bridges Do these steps 10 times in a row: Lie on your back on a firm bed or the floor. Bend your knees so they point up to the ceiling. Your feet should be flat on the floor. Your arms should be flat at your sides, next to your body. Tighten your butt muscles and lift your butt off the floor until your waist is almost as high as your knees. If you do not feel the muscles working in your butt and the back of   your thighs, slide your feet 1-2 inches (2.5-5 cm) farther away from your butt. Stay in this position for 3-5 seconds. Slowly lower your butt to the floor, and let your butt muscles relax. If this exercise is too easy, try doing it with your arms crossed over your chest. Belly crunches Do these steps 5-10 times in a row: Lie on your back on a firm bed or the floor with your legs stretched out. Bend your knees so they point up to the ceiling. Your feet should be flat on the floor. Cross your arms over your chest. Tip your chin a little bit toward your chest, but do not bend your neck. Tighten your belly muscles and slowly raise your chest just enough to lift your shoulder blades a tiny bit off the floor. Avoid raising your body  higher than that because it can put too much stress on your lower back. Slowly lower your chest and your head to the floor. Back lifts Do these steps 5-10 times in a row: Lie on your belly (face-down) with your arms at your sides, and rest your forehead on the floor. Tighten the muscles in your legs and your butt. Slowly lift your chest off the floor while you keep your hips on the floor. Keep the back of your head in line with the curve in your back. Look at the floor while you do this. Stay in this position for 3-5 seconds. Slowly lower your chest and your face to the floor. Contact a doctor if: Your back pain gets a lot worse when you do an exercise. Your back pain does not get better within 2 hours after you exercise. If you have any of these problems, stop doing the exercises. Do not do them again unless your doctor says it is okay. Get help right away if: You have sudden, very bad back pain. If this happens, stop doing the exercises. Do not do them again unless your doctor says it is okay. This information is not intended to replace advice given to you by your health care provider. Make sure you discuss any questions you have with your health care provider. Document Revised: 12/18/2020 Document Reviewed: 12/18/2020 [employer] Patient Education  2023 [employer]  

## 2022-03-10 NOTE — Progress Notes (Signed)
Subjective:    Patient ID: [name], female    DOB: 10/04/1951, 71 y.o.   MRN: [id number]  HPI   CC:  Low back pain - referred by PCP to eval for potential interventional pain procedures   71 yo female with Low back pain radiating to LLE for ~20 yrs.  Was seen by this provider in 2006, paper records not available at this time Leg pain is intermittent not associated with activity but does not occur on a daily basis Low back pain is constant but worsens with walking  Reviewed MRI findings from Jan 2022  Takes hydrocodone 7.5 mg usually 2x per day  Has had physical therapy last summer which was helpful for a time but pain returned Discussed stretching exercises   + constipation but no bowel or bladder incont No leg weakness except when LLE sciatic pain flaring up  [name] in 2006   MRI LUMBAR SPINE WITHOUT CONTRAST   TECHNIQUE: Multiplanar, multisequence MR imaging of the lumbar spine was performed. No intravenous contrast was administered.   COMPARISON:  Prior radiograph from 05/21/2010.   FINDINGS: Segmentation: Standard. Lowest well-formed disc space labeled the L5-S1 level.   Alignment: Levoscoliosis with straightening of the normal lumbar lordosis. Trace 2 mm anterolisthesis of L2 on L3, with 3 mm anterolisthesis of L3 on L4, and 4 mm anterolisthesis of L4 on L5. 4 mm retrolisthesis of L5 on S1. Findings chronic and facet mediated.   Vertebrae: Mild degenerative height loss present at the L4 vertebral body. Vertebral body height otherwise maintained. No acute or chronic fracture. Bone marrow signal intensity within normal limits. No discrete or worrisome osseous lesions. Prominent reactive endplate change present about the L3-4 through L5-S1 interspaces. No other abnormal marrow edema.   Conus medullaris and cauda equina: Conus extends to the superior aspect of L3. Conus medullaris within normal limits. 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   Paraspinal and other soft tissues: Paraspinous soft tissues within normal limits. Few simple cysts noted about the kidneys bilaterally. Visualized visceral structures otherwise unremarkable.   Disc levels:   T12-L1: Minimal disc bulge. Mild right greater than left facet hypertrophy. No canal or foraminal stenosis.   L1-2: Mild disc bulge. Moderate facet and ligament flavum hypertrophy. Resultant mild spinal stenosis. Foramina remain patent.   L2-3: Trace anterolisthesis. Diffuse disc bulge with disc desiccation and intervertebral disc space narrowing. Superimposed small central to right paracentral disc protrusion indents the ventral thecal sac (series 13, image 15). Moderate bilateral facet hypertrophy. Resultant mild canal with moderate right lateral recess stenosis. Foramina remain patent.   L3-4: 3 mm anterolisthesis. Diffuse disc bulge with disc desiccation and intervertebral disc space narrowing. Prominent reactive endplate spurring, most pronounced on the right. Severe bilateral facet arthrosis. Resultant severe spinal stenosis, with the thecal sac measuring 4 mm in AP diameter at its most narrow point. Moderate right with mild left L3 foraminal narrowing.   L4-5: 4 mm anterolisthesis. Diffuse disc bulge with disc desiccation and intervertebral disc space narrowing. Prominent reactive endplate change with osteophytic endplate spurring, most pronounced on the left. Severe bilateral facet arthrosis with ligament flavum hypertrophy, also slightly worse on the left. Resultant severe spinal stenosis. Moderate to severe left worse than right L4 foraminal narrowing.   L5-S1: 4 mm retrolisthesis. Diffuse disc bulge with disc desiccation and intervertebral disc space narrowing. Associated discogenic reactive endplate change with marginal endplate osteophytic spurring. Moderate left worse than  right facet hypertrophy. Trace joint effusion on the right.  No significant spinal stenosis. Severe left greater than right L5 foraminal narrowing.   IMPRESSION: 1. Multifactorial degenerative changes at L3-4 and L4-5 with resultant severe spinal stenosis, with moderate to severe right L3 and bilateral L4 foraminal stenosis as above. 2. Chronic degenerative disc bulge with facet hypertrophy at L5-S1 with resultant severe left greater than right L5 foraminal stenosis. 3. Disc bulge with facet hypertrophy at L2-3 with resultant mild canal and moderate right lateral recess stenosis. 4. 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     Electronically Signed   By: Jeannine Boga M.D.   On: 11/11/2020 02:25  Pain Inventory Average Pain 8 Pain Right Now 10 My pain is constant, sharp, burning, tingling, and aching  In the last 24 hours, has pain interfered with the following? General activity 8 Relation with others 8 Enjoyment of life 8 What TIME of day is your pain at its worst? morning , daytime, evening, and night Sleep (in general) Poor  Pain is worse with: walking, standing, and some activites Pain improves with: rest, heat/ice, therapy/exercise, pacing activities, and medication Relief from Meds: 5  walk without assistance walk with assistance use a cane use a walker ability to climb steps?  yes do you drive?  yes Do you have any goals in this area?  yes  retired I need assistance with the following:  meal prep, household duties, and shopping Do you have any goals in this area?  yes  weakness numbness tingling trouble walking spasms dizziness  Any changes since last visit?  yes CT/MRI - [place]  Any changes since last visit?  no    Family History  Problem Relation Age of Onset   Hypertension Mother    Arthritis Mother    Cancer Sister 70       Breast   Cancer Sister 20       Breast   Arthritis Father    Diabetes Father    Heart  attack Sister    Drug abuse Sister    Colon cancer Neg Hx    Esophageal cancer Neg Hx    Rectal cancer Neg Hx    Stomach cancer Neg Hx    Social History   Socioeconomic History   Marital status: Married    Spouse name: Not on file   Number of children: Not on file   Years of education: Not on file   Highest education level: Not on file  Occupational History   Occupation: Homemaker    Employer: RETIRED  Tobacco Use   Smoking status: Former    Packs/day: 1.00    Years: 4.00    Pack years: 4.00    Types: Cigarettes    Quit date: 10/20/1979    Years since quitting: 42.4   Smokeless tobacco: Never  Substance and Sexual Activity   Alcohol use: No   Drug use: No   Sexual activity: Yes  Other Topics Concern   Not on file  Social History Narrative   Divorced.  Father of children remains involved with family   no smoke,    no etoh,    Pt works as Engineer, building services for [employer].   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   Mother of 43 adopted children, most with special needs   Regular exercise-no         Social Determinants of Health  Financial Resource Strain: Not on file  Food Insecurity: Not on file  Transportation Needs: Not on file  Physical Activity: Not on file  Stress: Not on file  Social Connections: Not on file   Past Surgical History:  Procedure Laterality Date   ANTERIOR CERVICAL DECOMP/DISCECTOMY FUSION  04/28/2012   Procedure: ANTERIOR CERVICAL DECOMPRESSION/DISCECTOMY FUSION 3 LEVELS;  Surgeon: Ophelia Charter, MD;  Location: [place] [facility];  Service: Neurosurgery;  Laterality: N/A;  Cervical three-four,Cervical four-five,Cervical five-six anterior cervical decompression with fusion interbody prothesis plating and bonegraft   BREAST BIOPSY  07/2009   S/P Breast Biopsy [facility], [place], 07/2009): Benign findings.  (10/27/2010)   CARPAL TUNNEL RELEASE Left 12/10/2016   Procedure: LEFT CARPAL TUNNEL RELEASE;  Surgeon: Daryll Brod, MD;  Location: [place];  Service: Orthopedics;  Laterality: Left;   COLONOSCOPY W/ POLYPECTOMY  12/2000   colonoscopy (Dr Collene Mares) int. hemorrhoids &  nonneoplatic colon polyps - 01/10/2001,    COLONOSCOPY W/ POLYPECTOMY  01/2012   Dr Hilarie Fredrickson (GI).sigmoid colon, hyperlastic polyp   ENDOMETRIAL BIOPSY  08/2004   Endometrial Biopsy - 09/01/2004,   NM MYOVIEW LTD  04/2004   Cardiolite:EF63%, no ischemia - 05/01/2004,    REPLACEMENT TOTAL KNEE BILATERAL     Past Medical History:  Diagnosis Date   Acute MEE (middle ear effusion) 06/07/2014   Acute recurrent sinusitis 12/21/2014   Acute sinusitis 12/21/2014   Anemia    hx   Asthma, intermittent 11/02/2018   ATTENTION DEFICIT, W/O HYPERACTIVITY 12/16/2006   Bilateral carpal tunnel syndrome 11/20/2016   Cervical spondylosis with myelopathy, History of 04/28/2012   DIABETES MELLITUS II, UNCOMPLICATED 03/25/3015   DIFFUSE IDIOPATHIC SKELETAL HYPEROSTOSIS 03/29/2008   Fall 10/18/2020   GERD (gastroesophageal reflux disease)    H/O abnormal mammogram 07/19/2009   S/P Breast Biopsy [facility], [place], 07/2009): Benign findings.     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   HEMORRHOIDS, NOS 12/16/2006   Qualifier: History of  By: McDiarmid MD, Sherren Mocha     History of blood transfusion 10/2004   with knee replacement   History of peptic ulcer 12/16/2006   UGI series PUD - 10/19/1998     History of right greater trochanteric bursitis 11/07/2009   HYPERCHOLESTEROLEMIA 02/28/2007   HYPERTENSION, BENIGN SYSTEMIC 12/16/2006   Hyponatremia 08/30/2015   INCONTINENCE, URGE 12/16/2006   Qualifier: History of  By: McDiarmid MD, Todd     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/20/2016   OSTEOARTHRITIS, MULTI SITES 12/16/2006   Perennial allergic rhinitis with seasonal variation 12/16/2006        PONV (postoperative nausea and vomiting)    last surgery only   Recurrent maxillary sinusitis    SACROILIITIS, HISTORY OF 01/08/2009   Qualifier: History of  By: McDiarmid MD,  Sherren Mocha     Seasonal allergies    Ulcer    Uterine fibroid 02/02/2011   TVUS: 4cm fibroid w/ submucosal component, bil.hydrosalpinges, unable measurable endometrium - 08/18/2004    There were no vitals taken for this visit.  Opioid Risk Score:   Fall Risk Score:  `1  Depression screen PHQ 2/9     11/13/2021    3:10 PM 10/17/2020    2:40 PM 03/07/2020   10:15 AM 11/09/2019   10:39 AM 07/20/2019   11:11 AM 05/11/2019    9:47 AM 06/23/2018   11:16 AM  Depression screen PHQ 2/9  Decreased Interest 0 0 0 0 0 0 0  Down, Depressed,  Hopeless 0 0 0 0 0 0 0  PHQ - 2 Score 0 0 0 0 0 0 0  Altered sleeping 0 0       Tired, decreased energy 1 1       Change in appetite 0 1       Feeling bad or failure about yourself  0 0       Trouble concentrating 0 0       Moving slowly or fidgety/restless  0       Suicidal thoughts 0 0       PHQ-9 Score 1 2       Difficult doing work/chores Not difficult at all Somewhat difficult         Review of Systems  Musculoskeletal:  Positive for back pain and gait problem.       Spasms  Neurological:  Positive for dizziness, weakness and numbness.  All other systems reviewed and are negative.     Objective:   Physical Exam Vitals and nursing note reviewed.  Constitutional:      Appearance: She is obese.  Skin:    General: Skin is warm and dry.  Neurological:     Mental Status: She is alert and oriented to person, place, and time.     Comments: Left EHL weakness 4- /5  Left great toe parasthesia but has sensation   Psychiatric:        Mood and Affect: Mood normal.        Behavior: Behavior normal.   Motor strength is 5/5 in bilateral Hip flexor , Knee ext, Right Ankle DF 4/5 Left ankle DF  Neg SLR bilateral  Pt has difficulty laying flat so SI proocative tests could not be performed  Bilateral hip IR/ER mildly restricted   Amb with can no evidence of toe drag or knee instability  Pain to palpoation bilateral lumbar paraspinal area L4-S1      Assessment & Plan:   Lumbar spondylosis causing chronic low back pain of ~20 yr duration , pain corresponds to imaging findings at L4-S1, no sig improvement after PT or opioids Will schedule for diagnostic L3-4-5  Bilateral MBB uner fluoro guidance  Intermittent L4-5  radicular pain , has mild L5 sensory changes and weakness which pt feels is chronic, no need for Left L4-5 ESI ath this time  Obesity contributing to abnormal posture - agree with weight loss as pt has already discussed with PCP

## 2022-03-17 ENCOUNTER — Other Ambulatory Visit: Payer: Self-pay

## 2022-03-17 DIAGNOSIS — F988 Other specified behavioral and emotional disorders with onset usually occurring in childhood and adolescence: Secondary | ICD-10-CM

## 2022-03-18 MED ORDER — METHYLPHENIDATE HCL 10 MG PO TABS
20.0000 mg | ORAL_TABLET | Freq: Three times a day (TID) | ORAL | 0 refills | Status: DC
Start: 2022-03-18 — End: 2022-04-13

## 2022-03-18 MED ORDER — HYDROCODONE-ACETAMINOPHEN 7.5-325 MG PO TABS: 1.0000 | ORAL_TABLET | Freq: Three times a day (TID) | ORAL | 0 refills | Status: DC | PRN

## 2022-03-23 ENCOUNTER — Other Ambulatory Visit: Payer: Self-pay | Admitting: Family Medicine

## 2022-03-23 DIAGNOSIS — E119 Type 2 diabetes mellitus without complications: Secondary | ICD-10-CM

## 2022-03-23 DIAGNOSIS — I10 Essential (primary) hypertension: Secondary | ICD-10-CM

## 2022-03-24 ENCOUNTER — Encounter: Payer: Self-pay | Admitting: *Deleted

## 2022-04-08 ENCOUNTER — Encounter: Payer: Self-pay | Admitting: Internal Medicine

## 2022-04-13 ENCOUNTER — Encounter: Payer: Self-pay | Admitting: Internal Medicine

## 2022-04-13 ENCOUNTER — Other Ambulatory Visit: Payer: Self-pay

## 2022-04-13 DIAGNOSIS — F988 Other specified behavioral and emotional disorders with onset usually occurring in childhood and adolescence: Secondary | ICD-10-CM

## 2022-04-14 MED ORDER — METHYLPHENIDATE HCL 10 MG PO TABS: 20.0000 mg | ORAL_TABLET | Freq: Three times a day (TID) | ORAL | 0 refills | Status: DC

## 2022-04-14 MED ORDER — HYDROCODONE-ACETAMINOPHEN 7.5-325 MG PO TABS: 1.0000 | ORAL_TABLET | Freq: Three times a day (TID) | ORAL | 0 refills | Status: DC | PRN

## 2022-04-16 ENCOUNTER — Encounter: Payer: Medicare Other | Attending: Physical Medicine & Rehabilitation | Admitting: Physical Medicine & Rehabilitation

## 2022-04-16 ENCOUNTER — Encounter: Payer: Self-pay | Admitting: Physical Medicine & Rehabilitation

## 2022-04-16 VITALS — BP 154/79 | HR 108 | Temp 99.2°F | Ht 62.0 in | Wt 189.0 lb

## 2022-04-16 DIAGNOSIS — M47817 Spondylosis without myelopathy or radiculopathy, lumbosacral region: Secondary | ICD-10-CM | POA: Diagnosis not present

## 2022-04-16 NOTE — Patient Instructions (Addendum)
Lumbar medial branch blocks were performed. This is to help diagnose the cause of the low back pain. It is important that you keep track of your pain for the first day or 2 after injection. This injection can give you temporary relief that lasts for hours or up to several months. There is no way to predict duration of pain relief.  Please try to compare your pain after injection to for the injection.  If this injection gives you  temporary relief there may be another longer-lasting procedure that may be beneficial call radiofrequency ablation   Call tomorrow or send message about the degree of pain relief you note during daily activities

## 2022-04-16 NOTE — Progress Notes (Signed)
  PROCEDURE RECORD [facility]   Name: [name] DOB:[birth year] MRN: [id number]  Date:04/16/2022  Physician: Alysia Penna, MD    Nurse/CMA: Ronnald Ramp, RMA   Allergies:  Allergies  Allergen Reactions   Carvedilol [Coreg] Shortness Of Breath and Other (See Comments)    Chest tightness   Januvia [Sitagliptin] Other (See Comments)    Epigastric abdominal pain   Jardiance [Empagliflozin] Other (See Comments)    Lightheadedness, near-syncope, extreme yeast problems   Celecoxib Palpitations    REACTION: palpitations   Diclofenac-Misoprostol Palpitations    REACTION: palpitation    Consent Signed: Yes.    Is patient diabetic? Yes.    CBG today? 132 last week   Pregnant: No. LMP: No LMP recorded. Patient is postmenopausal. (age 80-55)  Anticoagulants: no Anti-inflammatory: no Antibiotics: no  Procedure: Bilateral L3-4-5 Medial Branch Block  Position: Prone Start Time: 3:14 pm  End Time: 3:26 pm  Fluoro Time: 21  RN/CMA Bronsyn Shappell, RMA Chaska Hagger, RMA    Time 2:36 PM 3:32 pm    BP 126/75 154/79    Pulse 100 108    Respirations 16 16    O2 Sat 97 98    S/S 6 6    Pain Level 8/10 4/10     D/C home with Self, patient A & O X 3, D/C instructions reviewed, and sits independently.

## 2022-04-16 NOTE — Progress Notes (Signed)
Bilateral Lumbar L3, L4  medial branch blocks and L 5 dorsal ramus injection under fluoroscopic guidance  Indication: Lumbar pain which is not relieved by medication management or other conservative care and interfering with self-care and mobility.  Informed consent was obtained after describing risks and benefits of the procedure with the patient, this includes bleeding, infection, paralysis and medication side effects.  The patient wishes to proceed and has given written consent.  The patient was placed in prone position.  The lumbar area was marked and prepped with Betadine.  One mL of 1% lidocaine was injected into each of 6 areas into the skin and subcutaneous tissue.  Then a 22-gauge 3.5" spinal needle was inserted targeting the junction of the left S1 superior articular process and sacral ala junction. Needle was advanced under fluoroscopic guidance.  Bone contact was made.  Isovue 200 was injected x 0.5 mL demonstrating no intravascular uptake.  Then a solution  of 2% MPF lidocaine was injected x 0.5 mL.  Then the left L5 superior articular process in transverse process junction was targeted.  Bone contact was made.  Isovue 200 was injected x 0.5 mL demonstrating no intravascular uptake. Then a solution containing  2% MPF lidocaine was injected x 0.5 mL.  Then the left L4 superior articular process in transverse process junction was targeted.  Bone contact was made.  Isovue 200 was injected x 0.5 mL demonstrating no intravascular uptake.  Then a solution containing2% MPF lidocaine was injected x 0.5 mL.  This same procedure was performed on the right side using the same needle, technique and injectate.  Patient tolerated procedure well.  Post procedure instructions were given.

## 2022-05-04 ENCOUNTER — Other Ambulatory Visit: Payer: Self-pay

## 2022-05-04 ENCOUNTER — Ambulatory Visit (AMBULATORY_SURGERY_CENTER): Payer: Self-pay

## 2022-05-04 VITALS — Ht 62.0 in | Wt 191.6 lb

## 2022-05-04 DIAGNOSIS — Z1211 Encounter for screening for malignant neoplasm of colon: Secondary | ICD-10-CM

## 2022-05-04 MED ORDER — PEG 3350-KCL-NA BICARB-NACL 420 G PO SOLR: 4000.0000 mL | Freq: Once | ORAL | 0 refills | Status: AC

## 2022-05-04 NOTE — Progress Notes (Signed)
Denies allergies to eggs or soy products. Denies complication of anesthesia or sedation. Denies use of weight loss medication. Denies use of O2.   Emmi instructions given for colonoscopy.  

## 2022-05-09 ENCOUNTER — Other Ambulatory Visit: Payer: Self-pay | Admitting: Family Medicine

## 2022-05-09 DIAGNOSIS — Q762 Congenital spondylolisthesis: Secondary | ICD-10-CM

## 2022-05-09 DIAGNOSIS — M47817 Spondylosis without myelopathy or radiculopathy, lumbosacral region: Secondary | ICD-10-CM

## 2022-05-13 ENCOUNTER — Encounter: Payer: Self-pay | Admitting: Family Medicine

## 2022-05-13 DIAGNOSIS — Z86000 Personal history of in-situ neoplasm of breast: Secondary | ICD-10-CM

## 2022-05-13 DIAGNOSIS — D0500 Lobular carcinoma in situ of unspecified breast: Secondary | ICD-10-CM

## 2022-05-13 DIAGNOSIS — N62 Hypertrophy of breast: Secondary | ICD-10-CM | POA: Insufficient documentation

## 2022-05-13 DIAGNOSIS — D0590 Unspecified type of carcinoma in situ of unspecified breast: Secondary | ICD-10-CM | POA: Insufficient documentation

## 2022-05-13 HISTORY — DX: Lobular carcinoma in situ of unspecified breast: D05.00

## 2022-05-13 HISTORY — DX: Personal history of in-situ neoplasm of breast: Z86.000

## 2022-05-13 NOTE — Patient Instructions (Incomplete)
     Your A1c is excellent, keep taking the metformin twice a day.  Your blood pressure is very good.  Keep taking the blood pressure medicine like you are.    SHINGLES VACCINATION  Chickenpox and shingles are related because they are caused by the same virus (varicella-zoster virus). After a person recovers from chickenpox, the virus stays dormant (inactive) in the body. It can reactivate years later and cause shingles.  CDC recommends that adults 50 years and older get two doses of the shingles vaccine called Shingrix (recombinant zoster vaccine) to prevent shingles and the complications from the disease.  Shingrix vaccination provides strong protection against shingles. In adults 50 years Shingrix is more than 90% effective at preventing shingles.  Studies show that Shingrix is safe. The vaccine helps your body create a strong defense against shingles. As a result, you are likely to have temporary side effects from getting the shots. Some people feel they are having a mild flu without the head cold symptoms.  The side effects might affect your ability to do normal daily activities for 2 to 3 days.  Getting the shot when you have a couple days without commitments is a good idea, in case you do feel under the weather.  Medicare prescription drug plans (Part D) usually cover all available vaccines needed to prevent illness, like the shingles (Shingrix) shot. Contact your Medicare drug plan If you are uncertain if they will cover the shingles vaccination.   Your pharmacist can give you Shingrix as a shot in your upper arm. You will need two Shingrix vaccinations to complete your inoculation against shingles.  The second injection is given at least 2 months after the first Shingrix vaccination was given.

## 2022-05-14 ENCOUNTER — Encounter: Payer: Self-pay | Admitting: Family Medicine

## 2022-05-14 ENCOUNTER — Ambulatory Visit (INDEPENDENT_AMBULATORY_CARE_PROVIDER_SITE_OTHER): Payer: Medicare Other | Admitting: Family Medicine

## 2022-05-14 VITALS — BP 142/64 | HR 111 | Wt 187.0 lb

## 2022-05-14 DIAGNOSIS — E1159 Type 2 diabetes mellitus with other circulatory complications: Secondary | ICD-10-CM | POA: Diagnosis not present

## 2022-05-14 DIAGNOSIS — F988 Other specified behavioral and emotional disorders with onset usually occurring in childhood and adolescence: Secondary | ICD-10-CM | POA: Diagnosis not present

## 2022-05-14 DIAGNOSIS — Q762 Congenital spondylolisthesis: Secondary | ICD-10-CM

## 2022-05-14 DIAGNOSIS — E1142 Type 2 diabetes mellitus with diabetic polyneuropathy: Secondary | ICD-10-CM

## 2022-05-14 DIAGNOSIS — M47817 Spondylosis without myelopathy or radiculopathy, lumbosacral region: Secondary | ICD-10-CM | POA: Diagnosis not present

## 2022-05-14 DIAGNOSIS — M48062 Spinal stenosis, lumbar region with neurogenic claudication: Secondary | ICD-10-CM | POA: Diagnosis not present

## 2022-05-14 DIAGNOSIS — E1149 Type 2 diabetes mellitus with other diabetic neurological complication: Secondary | ICD-10-CM

## 2022-05-14 DIAGNOSIS — I152 Hypertension secondary to endocrine disorders: Secondary | ICD-10-CM

## 2022-05-14 LAB — POCT GLYCOSYLATED HEMOGLOBIN (HGB A1C): HbA1c, POC (controlled diabetic range): 6.8 % (ref 0.0–7.0)

## 2022-05-14 MED ORDER — METHYLPHENIDATE HCL 10 MG PO TABS: 20.0000 mg | ORAL_TABLET | Freq: Three times a day (TID) | ORAL | 0 refills | Status: DC

## 2022-05-14 MED ORDER — HYDROCODONE-ACETAMINOPHEN 7.5-325 MG PO TABS: 1.0000 | ORAL_TABLET | Freq: Three times a day (TID) | ORAL | 0 refills | Status: DC | PRN

## 2022-05-15 ENCOUNTER — Encounter: Payer: Self-pay | Admitting: Family Medicine

## 2022-05-15 NOTE — Assessment & Plan Note (Signed)
Established problem Well Controlled and is at goal of A1c < 8.0. No signs of complications, medication side effects, or red flags. Continue Metformin 1500 mg daily

## 2022-05-15 NOTE — Progress Notes (Signed)
[name] is accompanied by patient and 72 YO granddgt  Sources of clinical information for visit is/are patient. Nursing assessment for this office visit was reviewed with the patient for accuracy and revision.     Previous Report(s) Reviewed: lab reports     04/16/2022    2:35 PM  Depression screen PHQ 2/9  Decreased Interest 0  Down, Depressed, Hopeless 0  PHQ - 2 Score 0   Flowsheet Row Office Visit from 03/10/2022 in [facility] Office Visit from 11/13/2021 in [facility] Office Visit from 10/17/2020 in [facility]  Thoughts that you would be better off dead, or of hurting yourself in some way Not at all Not at all Not at all  PHQ-9 Total Score 0 1 2          05/14/2022    2:42 PM 04/16/2022    2:35 PM 03/10/2022   11:13 AM 11/13/2021    3:10 PM 10/17/2020    2:41 PM  [place] in the past year? 0 0  0 0  Comment   Last fallen May 2023    Number falls in past yr:  0 1 0 0  Injury with Fall?  0 0 0 0       04/16/2022    2:35 PM 03/10/2022   11:13 AM 11/13/2021    3:10 PM  PHQ9 SCORE ONLY  PHQ-9 Total Score 0 0 1    Adult vaccines due  Topic Date Due   TETANUS/TDAP  03/03/2022    Health Maintenance Due  Topic Date Due   Zoster Vaccines- Shingrix (1 of 2) Never done   MAMMOGRAM  10/15/2018   OPHTHALMOLOGY EXAM  11/09/2020   COVID-19 Vaccine (5 - Booster for Moderna series) 09/24/2021   FOOT EXAM  10/17/2021   COLONOSCOPY (Pts 45-54yr Insurance coverage will need to be confirmed)  02/02/2022   TETANUS/TDAP  03/03/2022      History/P.E. limitations: none  Adult vaccines due  Topic Date Due   TETANUS/TDAP  03/03/2022    Diabetes Health Maintenance Due  Topic Date Due   OPHTHALMOLOGY EXAM  11/09/2020   FOOT EXAM  10/17/2021   HEMOGLOBIN A1C  11/14/2022   LIPID PANEL  11/08/2024    Health Maintenance Due  Topic Date Due   Zoster Vaccines- Shingrix (1 of 2) Never  done   MAMMOGRAM  10/15/2018   OPHTHALMOLOGY EXAM  11/09/2020   COVID-19 Vaccine (5 - Booster for Moderna series) 09/24/2021   FOOT EXAM  10/17/2021   COLONOSCOPY (Pts 45-434yrInsurance coverage will need to be confirmed)  02/02/2022   TETANUS/TDAP  03/03/2022     Chief Complaint  Patient presents with   Diabetes    Diabetic Foot Exam - Simple   Simple Foot Form Visual Inspection No deformities, no ulcerations, no other skin breakdown bilaterally: Yes Sensation Testing Intact to touch and monofilament testing bilaterally: Yes Pulse Check Posterior Tibialis and Dorsalis pulse intact bilaterally: Yes Comments     --------------------------------------------------------------------------------------------------------------------------------------------- Visit Problem List with A/P  Hypertension associated with diabetes (HCParkerEstablished problem Well Controlled and is at goal of <140/90. No signs of complications, medication side effects, or red flags. Continue current medications and other regiments.   Type 2 diabetes mellitus with neurological complications (HCOakvilleEstablished problem Well Controlled and is at goal of A1c < 8.0. No signs of complications, medication side effects, or red flags. Continue Metformin 1500  mg daily

## 2022-05-15 NOTE — Assessment & Plan Note (Addendum)
Adequate symptom control. Tolerating gabapentinmedication, using prn instead of schedyle. Continue current prn medication regiment.

## 2022-05-15 NOTE — Assessment & Plan Note (Signed)
Established problem Well Controlled and is at goal of 140/90. No signs of complications, medication side effects, or red flags. Continue current medications and other regiments.  

## 2022-05-25 ENCOUNTER — Encounter: Payer: Self-pay | Admitting: Internal Medicine

## 2022-05-25 ENCOUNTER — Ambulatory Visit (AMBULATORY_SURGERY_CENTER): Payer: Medicare Other | Admitting: Internal Medicine

## 2022-05-25 VITALS — BP 160/76 | HR 90 | Temp 96.8°F | Resp 15 | Ht 62.0 in | Wt 191.6 lb

## 2022-05-25 DIAGNOSIS — Z1211 Encounter for screening for malignant neoplasm of colon: Secondary | ICD-10-CM | POA: Diagnosis not present

## 2022-05-25 DIAGNOSIS — D128 Benign neoplasm of rectum: Secondary | ICD-10-CM | POA: Diagnosis not present

## 2022-05-25 MED ORDER — SODIUM CHLORIDE 0.9 % IV SOLN: 500.0000 mL | Freq: Once | INTRAVENOUS | Status: DC

## 2022-05-25 NOTE — Patient Instructions (Signed)
  Handouts on Polyps and Hemorrhoids given.  YOU HAD AN ENDOSCOPIC PROCEDURE TODAY AT [facility]:   Refer to the procedure report that was given to you for any specific questions about what was found during the examination.  If the procedure report does not answer your questions, please call your gastroenterologist to clarify.  If you requested that your care partner not be given the details of your procedure findings, then the procedure report has been included in a sealed envelope for you to review at your convenience later.  YOU SHOULD EXPECT: Some feelings of bloating in the abdomen. Passage of more gas than usual.  Walking can help get rid of the air that was put into your GI tract during the procedure and reduce the bloating. If you had a lower endoscopy (such as a colonoscopy or flexible sigmoidoscopy) you may notice spotting of blood in your stool or on the toilet paper. If you underwent a bowel prep for your procedure, you may not have a normal bowel movement for a few days.  Please Note:  You might notice some irritation and congestion in your nose or some drainage.  This is from the oxygen used during your procedure.  There is no need for concern and it should clear up in a day or so.  SYMPTOMS TO REPORT IMMEDIATELY:  Following lower endoscopy (colonoscopy or flexible sigmoidoscopy):  Excessive amounts of blood in the stool  Significant tenderness or worsening of abdominal pains  Swelling of the abdomen that is new, acute  Fever of 100F or higher   For urgent or emergent issues, a gastroenterologist can be reached at any hour by calling [phone]. Do not use MyChart messaging for urgent concerns.    DIET:  We do recommend a small meal at first, but then you may proceed to your regular diet.  Drink plenty of fluids but you should avoid alcoholic beverages for 24 hours.  ACTIVITY:  You should plan to take it easy for the rest of today and you should NOT DRIVE  or use heavy machinery until tomorrow (because of the sedation medicines used during the test).    FOLLOW UP: Our staff will call the number listed on your records the next business day following your procedure.  We will call around 7:15- 8:00 am to check on you and address any questions or concerns that you may have regarding the information given to you following your procedure. If we do not reach you, we will leave a message.  If you develop any symptoms (ie: fever, flu-like symptoms, shortness of breath, cough etc.) before then, please call [phone].  If you test positive for Covid 19 in the 2 weeks post procedure, please call and report this information to [place].    If any biopsies were taken you will be contacted by phone or by letter within the next 1-3 weeks.  Please call us at [phone] if you have not heard about the biopsies in 3 weeks.    SIGNATURES/CONFIDENTIALITY: You and/or your care partner have signed paperwork which will be entered into your electronic medical record.  These signatures attest to the fact that that the information above on your After Visit Summary has been reviewed and is understood.  Full responsibility of the confidentiality of this discharge information lies with you and/or your care-partner.

## 2022-05-25 NOTE — Op Note (Signed)
[facility] Patient Name: [name] Procedure Date: 05/25/2022 11:59 AM MRN: [id number] Endoscopist: Jerene Bears , MD Age: 71 Referring MD:  Date of Birth: Apr 04, 1951 Gender: Female Account #: [id number] Procedure:                Colonoscopy Indications:              Screening for colorectal malignant neoplasm, Last                            colonoscopy 10 years ago Medicines:                Monitored Anesthesia Care Procedure:                Pre-Anesthesia Assessment:                           - Prior to the procedure, a History and Physical                            was performed, and patient medications and                            allergies were reviewed. The patient's tolerance of                            previous anesthesia was also reviewed. The risks                            and benefits of the procedure and the sedation                            options and risks were discussed with the patient.                            All questions were answered, and informed consent                            was obtained. Prior Anticoagulants: The patient has                            taken no previous anticoagulant or antiplatelet                            agents. ASA Grade Assessment: II - A patient with                            mild systemic disease. After reviewing the risks                            and benefits, the patient was deemed in                            satisfactory condition to undergo the procedure.  After obtaining informed consent, the colonoscope                            was passed under direct vision. Throughout the                            procedure, the patient's blood pressure, pulse, and                            oxygen saturations were monitored continuously. The                            CF [id number] #[id number] was introduced through the anus                            and advanced to the cecum, identified  by                            appendiceal orifice and ileocecal valve. The                            colonoscopy was performed without difficulty. The                            patient tolerated the procedure well. The quality                            of the bowel preparation was good. The ileocecal                            valve, appendiceal orifice, and rectum were                            photographed. Scope In: 12:09:00 PM Scope Out: 12:21:57 PM Scope Withdrawal Time: 0 hours 10 minutes 35 seconds  Total Procedure Duration: 0 hours 12 minutes 57 seconds  Findings:                 The perianal and digital rectal examinations were                            normal.                           A 7 mm polyp was found in the rectum. The polyp was                            sessile. The polyp was removed with a cold snare.                            Resection and retrieval were complete.                           Internal hemorrhoids were found during  retroflexion. The hemorrhoids were small.                           The exam was otherwise without abnormality. Complications:            No immediate complications. Estimated Blood Loss:     Estimated blood loss: none. Impression:               - One 7 mm polyp in the rectum, removed with a cold                            snare. Resected and retrieved.                           - Small internal hemorrhoids.                           - The examination was otherwise normal. Recommendation:           - Patient has a contact number available for                            emergencies. The signs and symptoms of potential                            delayed complications were discussed with the                            patient. Return to normal activities tomorrow.                            Written discharge instructions were provided to the                            patient.                           - Resume  previous diet.                           - Continue present medications.                           - Await pathology results.                           - Repeat colonoscopy is recommended. The                            colonoscopy date will be determined after pathology                            results from today's exam become available for                            review. Jerene Bears, MD 05/25/2022 12:23:35 PM This report has been signed electronically.

## 2022-05-25 NOTE — Progress Notes (Signed)
GASTROENTEROLOGY PROCEDURE H&P NOTE   Primary Care Physician: McDiarmid, Blane Ohara, MD    Reason for Procedure:  Colon cancer screening  Plan:    Colonoscopy  Patient is appropriate for endoscopic procedure(s) in the ambulatory ([facility]) setting.  The nature of the procedure, as well as the risks, benefits, and alternatives were carefully and thoroughly reviewed with the patient. Ample time for discussion and questions allowed. The patient understood, was satisfied, and agreed to proceed.     HPI: [name] is a 71 y.o. female who presents for colonoscopy.  Medical history as below.  Tolerated the prep.  No recent chest pain or shortness of breath.  No abdominal pain today.  Past Medical History:  Diagnosis Date   Acute MEE (middle ear effusion) 06/07/2014   Acute recurrent sinusitis 12/21/2014   Acute sinusitis 12/21/2014   Allergy    Anemia    hx   Asthma, intermittent 11/02/2018   ATTENTION DEFICIT, W/O HYPERACTIVITY 12/16/2006   Bilateral carpal tunnel syndrome 11/20/2016   Cataract    Cervical spondylosis with myelopathy, History of 04/28/2012   COLON POLYP 12/16/2006   (02/03/12) Surgical [Pathology], sigmoid colon, polyp HYPERPLASTIC POLYP(S) AND POLYPOID FRAGMETNS OF BENIGN COLONIC MUCOSA WITH INTRAMUCOSAL LYMPHOID AGGREGATES. NO ADENOMATOUS CHANGE OR MALIGNANCY IDENTIFIED.    DIABETES MELLITUS II, UNCOMPLICATED 15/40/0867   DIFFUSE IDIOPATHIC SKELETAL HYPEROSTOSIS 03/29/2008   Fall 10/18/2020   GERD (gastroesophageal reflux disease)    H/O abnormal mammogram 07/19/2009   S/P Breast Biopsy [facility], [place], 07/2009): Benign findings.     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   HEMORRHOIDS, NOS 12/16/2006   Qualifier: History of  By: McDiarmid MD, Sherren Mocha     History of blood transfusion 10/2004   with knee replacement   History of peptic ulcer 12/16/2006   UGI series PUD - 10/19/1998     History of right greater trochanteric bursitis 11/07/2009    HYPERCHOLESTEROLEMIA 02/28/2007   HYPERTENSION, BENIGN SYSTEMIC 12/16/2006   Hyponatremia 08/30/2015   INCONTINENCE, URGE 12/16/2006   Qualifier: History of  By: McDiarmid MD, Todd     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/20/2016   OSTEOARTHRITIS, MULTI SITES 12/16/2006   Perennial allergic rhinitis with seasonal variation 12/16/2006        PONV (postoperative nausea and vomiting)    last surgery only   Recurrent maxillary sinusitis    SACROILIITIS, HISTORY OF 01/08/2009   Qualifier: History of  By: McDiarmid MD, Todd     Seasonal allergies    Seborrheic keratosis 01/15/2016   Ulcer    Uterine fibroid 02/02/2011   TVUS: 4cm fibroid w/ submucosal component, bil.hydrosalpinges, unable measurable endometrium - 08/18/2004     Past Surgical History:  Procedure Laterality Date   ANTERIOR CERVICAL DECOMP/DISCECTOMY FUSION  04/28/2012   Procedure: ANTERIOR CERVICAL DECOMPRESSION/DISCECTOMY FUSION 3 LEVELS;  Surgeon: Ophelia Charter, MD;  Location: [facility] [facility];  Service: Neurosurgery;  Laterality: N/A;  Cervical three-four,Cervical four-five,Cervical five-six anterior cervical decompression with fusion interbody prothesis plating and bonegraft   BREAST BIOPSY  07/2009   S/P Breast Biopsy [facility], [place], 07/2009): Benign findings.  (10/27/2010)   CARPAL TUNNEL RELEASE Left 12/10/2016   Procedure: LEFT CARPAL TUNNEL RELEASE;  Surgeon: Daryll Brod, MD;  Location: [facility];  Service: Orthopedics;  Laterality: Left;   COLONOSCOPY W/ POLYPECTOMY  12/2000   colonoscopy (Dr Collene Mares) int. hemorrhoids &  nonneoplatic colon polyps - 01/10/2001,    COLONOSCOPY W/ POLYPECTOMY  01/2012   Dr Hilarie Fredrickson (GI).sigmoid colon, hyperlastic polyp   ENDOMETRIAL BIOPSY  08/2004   Endometrial Biopsy - 09/01/2004,   NM MYOVIEW LTD  04/2004   Cardiolite:EF63%, no ischemia - 05/01/2004,    REPLACEMENT TOTAL KNEE BILATERAL      Prior to Admission medications   Medication Sig Start  Date End Date Taking? Authorizing Provider  amLODipine-benazepril (LOTREL) 10-20 MG capsule TAKE 1 CAPSULE BY MOUTH DAILY 03/24/22  Yes McDiarmid, Blane Ohara, MD  atorvastatin (LIPITOR) 40 MG tablet Take 1 tablet (40 mg total) by mouth daily. 05/11/19  Yes McDiarmid, Blane Ohara, MD  gabapentin (NEURONTIN) 100 MG capsule TAKE 1 CAPSULE(100 MG) BY MOUTH THREE TIMES DAILY AS NEEDED 06/27/21  Yes McDiarmid, Blane Ohara, MD  hydrochlorothiazide (HYDRODIURIL) 25 MG tablet Take 1 tablet (25 mg total) by mouth daily. 11/13/21  Yes McDiarmid, Blane Ohara, MD  HYDROcodone-acetaminophen (NORCO) 7.5-325 MG tablet Take 1 tablet by mouth every 8 (eight) hours as needed for moderate pain. 05/14/22 06/13/22 Yes McDiarmid, Blane Ohara, MD  metFORMIN (GLUCOPHAGE-XR) 500 MG 24 hr tablet Two tablets in the morning and one in the evening 11/13/21  Yes McDiarmid, Blane Ohara, MD  methylphenidate (RITALIN) 10 MG tablet Take 2 tablets (20 mg total) by mouth 3 (three) times daily with meals. 05/14/22 06/13/22 Yes McDiarmid, Blane Ohara, MD  vitamin B-12 (CYANOCOBALAMIN) 1000 MCG tablet Take 1 tablet (1,000 mcg total) by mouth daily. 05/11/19  Yes McDiarmid, Blane Ohara, MD  albuterol (VENTOLIN HFA) 108 (90 Base) MCG/ACT inhaler Inhale into the lungs. 08/30/15   [provider]  glucose blood (ACCU-CHEK SMARTVIEW) test strip 1 each by Other route 2 (two) times daily. Use as instructed 05/31/15   McDiarmid, Blane Ohara, MD  naproxen (NAPROSYN) 500 MG tablet TAKE 1 TABLET(500 MG) BY MOUTH TWICE DAILY WITH A MEAL 05/11/22   McDiarmid, Blane Ohara, MD  OVER THE COUNTER MEDICATION Store brand Ducolax stool softener. One capsule daily.    [provider]    Current Outpatient Medications  Medication Sig Dispense Refill   amLODipine-benazepril (LOTREL) 10-20 MG capsule TAKE 1 CAPSULE BY MOUTH DAILY 90 capsule 3   atorvastatin (LIPITOR) 40 MG tablet Take 1 tablet (40 mg total) by mouth daily. 90 tablet 3   gabapentin (NEURONTIN) 100 MG capsule TAKE 1 CAPSULE(100 MG) BY  MOUTH THREE TIMES DAILY AS NEEDED 270 capsule 3   hydrochlorothiazide (HYDRODIURIL) 25 MG tablet Take 1 tablet (25 mg total) by mouth daily. 90 tablet 3   HYDROcodone-acetaminophen (NORCO) 7.5-325 MG tablet Take 1 tablet by mouth every 8 (eight) hours as needed for moderate pain. 90 tablet 0   metFORMIN (GLUCOPHAGE-XR) 500 MG 24 hr tablet Two tablets in the morning and one in the evening 270 tablet 3   methylphenidate (RITALIN) 10 MG tablet Take 2 tablets (20 mg total) by mouth 3 (three) times daily with meals. 180 tablet 0   vitamin B-12 (CYANOCOBALAMIN) 1000 MCG tablet Take 1 tablet (1,000 mcg total) by mouth daily. 180 tablet 3   albuterol (VENTOLIN HFA) 108 (90 Base) MCG/ACT inhaler Inhale into the lungs.     glucose blood (ACCU-CHEK SMARTVIEW) test strip 1 each by Other route 2 (two) times daily. Use as instructed 100 each PRN   naproxen (NAPROSYN) 500 MG tablet TAKE 1 TABLET(500 MG) BY MOUTH TWICE DAILY WITH A MEAL 180 tablet 3   OVER THE COUNTER MEDICATION Store brand Ducolax stool softener. One capsule daily.     Current Facility-Administered Medications  Medication  Dose Route Frequency Provider Last Rate Last Admin   0.9 %  sodium chloride infusion  500 mL Intravenous Once Grainger Mccarley, Lajuan Lines, MD        Allergies as of 05/25/2022 - Review Complete 05/25/2022  Allergen Reaction Noted   Carvedilol [coreg] Shortness Of Breath and Other (See Comments) 09/22/2011   Januvia [sitagliptin] Other (See Comments) 01/15/2016   Jardiance [empagliflozin] Other (See Comments) 06/24/2018   Celecoxib Palpitations 10/26/2005   Diclofenac-misoprostol Palpitations 10/26/2005    Family History  Problem Relation Age of Onset   Hypertension Mother    Arthritis Mother    Cancer Sister 36       Breast   Cancer Sister 41       Breast   Arthritis Father    Diabetes Father    Heart attack Sister    Drug abuse Sister    Colon cancer Neg Hx    Esophageal cancer Neg Hx    Rectal cancer Neg Hx    Stomach  cancer Neg Hx     Social History   Socioeconomic History   Marital status: Married    Spouse name: Not on file   Number of children: Not on file   Years of education: Not on file   Highest education level: Not on file  Occupational History   Occupation: Homemaker    Employer: RETIRED  Tobacco Use   Smoking status: Former    Packs/day: 1.00    Years: 4.00    Total pack years: 4.00    Types: Cigarettes    Quit date: 10/20/1979    Years since quitting: 42.6   Smokeless tobacco: Never  Vaping Use   Vaping Use: Never used  Substance and Sexual Activity   Alcohol use: No   Drug use: No   Sexual activity: Yes  Other Topics Concern   Not on file  Social History Narrative   Divorced.  Father of children remains involved with family   no smoke,    no etoh,    Pt works as Engineer, building services for [employer].   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   Mother of 8 adopted children, most with special needs   Regular exercise-no         Social Determinants of Health   Financial Resource Strain: Not on file  Food Insecurity: Not on file  Transportation Needs: Not on file  Physical Activity: Not on file  Stress: Not on file  Social Connections: Not on file  Intimate Partner Violence: Not on file    Physical Exam: Vital signs in last 24 hours: '@BP'$  (!) 148/70   Pulse 95   Temp (!) 96.8 F (36 C) (Temporal)   Ht '5\' 2"'$  (1.575 m)   Wt 191 lb 9.6 oz (86.9 kg)   SpO2 99%   BMI 35.04 kg/m  GEN: NAD EYE: Sclerae anicteric ENT: MMM CV: Non-tachycardic Pulm: CTA b/l GI: Soft, NT/ND NEURO:  Alert & Oriented x 3   Zenovia Jarred, MD [facility]  05/25/2022 11:52 AM

## 2022-05-25 NOTE — Progress Notes (Signed)
Called to room to assist during endoscopic procedure.  Patient ID and intended procedure confirmed with present staff. Received instructions for my participation in the procedure from the performing physician.  

## 2022-05-25 NOTE — Progress Notes (Signed)
To pacu, VSS. Report to [email or web address].tb 

## 2022-05-26 ENCOUNTER — Telehealth: Payer: Self-pay | Admitting: *Deleted

## 2022-05-26 NOTE — Telephone Encounter (Signed)
Follow up call attempt. No answer or way to leave a message.

## 2022-05-28 ENCOUNTER — Encounter: Payer: Self-pay | Admitting: Internal Medicine

## 2022-06-04 ENCOUNTER — Ambulatory Visit: Payer: Medicare Other | Admitting: Physical Medicine & Rehabilitation

## 2022-06-05 ENCOUNTER — Encounter: Payer: Medicare Other | Attending: Physical Medicine & Rehabilitation | Admitting: Physical Medicine & Rehabilitation

## 2022-06-05 ENCOUNTER — Encounter: Payer: Self-pay | Admitting: Physical Medicine & Rehabilitation

## 2022-06-05 VITALS — BP 146/71 | HR 109 | Temp 98.8°F | Ht 62.0 in | Wt 188.0 lb

## 2022-06-05 DIAGNOSIS — M47817 Spondylosis without myelopathy or radiculopathy, lumbosacral region: Secondary | ICD-10-CM

## 2022-06-05 MED ORDER — LIDOCAINE HCL 1 % IJ SOLN
10.0000 mL | Freq: Once | INTRAMUSCULAR | Status: AC
  Administered 2022-06-05: 10 mL

## 2022-06-05 NOTE — Patient Instructions (Signed)

## 2022-06-05 NOTE — Progress Notes (Signed)
  PROCEDURE RECORD [facility]   Name: [name] DOB:May 12, 1951 MRN: [id number]  Date:06/05/2022  Physician: Alysia Penna, MD    Nurse/CMA: Jorja Loa MA  Allergies:  Allergies  Allergen Reactions   Carvedilol [Coreg] Shortness Of Breath and Other (See Comments)    Chest tightness   Januvia [Sitagliptin] Other (See Comments)    Epigastric abdominal pain   Jardiance [Empagliflozin] Other (See Comments)    Lightheadedness, near-syncope, extreme yeast problems   Celecoxib Palpitations    REACTION: palpitations   Diclofenac-Misoprostol Palpitations    REACTION: palpitation    Consent Signed: Yes.    Is patient diabetic? Yes.    CBG today? 128 a week ago. (Not checked recently)  Pregnant: No. LMP: No LMP recorded. Patient is postmenopausal. (age 7-55)  Anticoagulants: no Anti-inflammatory: no Antibiotics: no  Procedure: Bilateral L 3,4,5 Medial Branch Block  Position: Prone Start Time: 11:44 am  End Time: 11:54 am  Fluoro Time: 59  RN/CMA Andriel Omalley MA Renata Gambino MA    Time 11:22 am 12:00 pm    BP 146/71 142/63    Pulse 109 102    Respirations 16 16    O2 Sat 97 97    S/S 6 6    Pain Level 7/10 1/10     D/C home with Self, patient A & O X 3, D/C instructions reviewed, and sits independently.       ,,

## 2022-06-05 NOTE — Progress Notes (Signed)
Bilateral Lumbar L3, L4  medial branch blocks and L 5 dorsal ramus injection under fluoroscopic guidance  Indication: Lumbar pain which is not relieved by medication management or other conservative care and interfering with self-care and mobility.  Informed consent was obtained after describing risks and benefits of the procedure with the patient, this includes bleeding, infection, paralysis and medication side effects.  The patient wishes to proceed and has given written consent.  The patient was placed in prone position.  The lumbar area was marked and prepped with Betadine.  One mL of 1% lidocaine was injected into each of 6 areas into the skin and subcutaneous tissue.  Then a 22-gauge 3.5in spinal needle was inserted targeting the junction of the left S1 superior articular process and sacral ala junction. Needle was advanced under fluoroscopic guidance.  Bone contact was made.Omnipaque 180 was injected x 0.5 mL demonstrating no intravascular uptake.  Then a solution  of 2% MPF lidocaine was injected x 0.5 mL.  Then the left L5 superior articular process in transverse process junction was targeted.  Bone contact was made. Omnipaque 180 was injected x 0.5 mL demonstrating no intravascular uptake. Then a solution containing  2% MPF lidocaine was injected x 0.5 mL.  Then the left L4 superior articular process in transverse process junction was targeted.  Bone contact was made. Omnipaque 180 was injected x 0.5 mL demonstrating no intravascular uptake.  Then a solution containing2% MPF lidocaine was injected x 0.5 mL.  This same procedure was performed on the right side using the same needle, technique and injectate.  Patient tolerated procedure well.  Post procedure instructions were given.   Lidocaine 1% with preservative multidose, 10ml no waste Lidocaine 2% MPF, 5ml bottle, 3ml used 2ml waste Omnipaque 180 1.5 ml used, 8.5 ml waste  

## 2022-06-15 ENCOUNTER — Other Ambulatory Visit: Payer: Self-pay

## 2022-06-15 DIAGNOSIS — Q762 Congenital spondylolisthesis: Secondary | ICD-10-CM

## 2022-06-15 DIAGNOSIS — M47817 Spondylosis without myelopathy or radiculopathy, lumbosacral region: Secondary | ICD-10-CM

## 2022-06-15 DIAGNOSIS — F988 Other specified behavioral and emotional disorders with onset usually occurring in childhood and adolescence: Secondary | ICD-10-CM

## 2022-06-15 DIAGNOSIS — M48062 Spinal stenosis, lumbar region with neurogenic claudication: Secondary | ICD-10-CM

## 2022-06-16 MED ORDER — HYDROCODONE-ACETAMINOPHEN 7.5-325 MG PO TABS: 1.0000 | ORAL_TABLET | Freq: Three times a day (TID) | ORAL | 0 refills | Status: DC | PRN

## 2022-06-16 MED ORDER — METHYLPHENIDATE HCL 10 MG PO TABS
20.0000 mg | ORAL_TABLET | Freq: Three times a day (TID) | ORAL | 0 refills | Status: DC
Start: 2022-06-16 — End: 2022-07-15

## 2022-07-15 ENCOUNTER — Other Ambulatory Visit: Payer: Self-pay

## 2022-07-15 DIAGNOSIS — M47817 Spondylosis without myelopathy or radiculopathy, lumbosacral region: Secondary | ICD-10-CM

## 2022-07-15 DIAGNOSIS — M48062 Spinal stenosis, lumbar region with neurogenic claudication: Secondary | ICD-10-CM

## 2022-07-15 DIAGNOSIS — F988 Other specified behavioral and emotional disorders with onset usually occurring in childhood and adolescence: Secondary | ICD-10-CM

## 2022-07-15 DIAGNOSIS — Q762 Congenital spondylolisthesis: Secondary | ICD-10-CM

## 2022-07-17 MED ORDER — METHYLPHENIDATE HCL 10 MG PO TABS: 20.0000 mg | ORAL_TABLET | Freq: Three times a day (TID) | ORAL | 0 refills | Status: DC

## 2022-07-17 MED ORDER — HYDROCODONE-ACETAMINOPHEN 7.5-325 MG PO TABS
1.0000 | ORAL_TABLET | Freq: Three times a day (TID) | ORAL | 0 refills | Status: DC | PRN
Start: 2022-07-17 — End: 2022-08-18

## 2022-07-30 ENCOUNTER — Encounter: Payer: Medicare Other | Attending: Physical Medicine & Rehabilitation | Admitting: Physical Medicine & Rehabilitation

## 2022-07-30 ENCOUNTER — Encounter: Payer: Self-pay | Admitting: Physical Medicine & Rehabilitation

## 2022-07-30 VITALS — BP 138/78 | HR 101 | Ht 62.0 in | Wt 187.0 lb

## 2022-07-30 DIAGNOSIS — M47817 Spondylosis without myelopathy or radiculopathy, lumbosacral region: Secondary | ICD-10-CM | POA: Insufficient documentation

## 2022-07-30 DIAGNOSIS — Q762 Congenital spondylolisthesis: Secondary | ICD-10-CM | POA: Diagnosis not present

## 2022-07-30 MED ORDER — LIDOCAINE HCL 1 % IJ SOLN
10.0000 mL | Freq: Once | INTRAMUSCULAR | Status: AC
  Administered 2022-07-30: 10 mL

## 2022-07-30 MED ORDER — LIDOCAINE HCL (PF) 2 % IJ SOLN
5.0000 mL | Freq: Once | INTRAMUSCULAR | Status: AC
  Administered 2022-07-30: 5 mL

## 2022-07-30 NOTE — Patient Instructions (Signed)
You had a radio frequency procedure today This was done to alleviate joint pain in your lumbar area We injected lidocaine which is a local anesthetic.  You may experience soreness at the injection sites. You may also experienced some irritation of the nerves that were heated I'm recommending ice for 30 minutes every 2 hours as needed for the next 24-48 hours   

## 2022-07-30 NOTE — Progress Notes (Signed)
  PROCEDURE RECORD [facility]   Name: CHAUNA OSORIA DOB:1950-12-27 MRN: [id number]  Date:07/30/2022  Physician: Alysia Penna, MD    Nurse/CMA: Truman Hayward, CMA  Allergies:  Allergies  Allergen Reactions   Carvedilol [Coreg] Shortness Of Breath and Other (See Comments)    Chest tightness   Januvia [Sitagliptin] Other (See Comments)    Epigastric abdominal pain   Jardiance [Empagliflozin] Other (See Comments)    Lightheadedness, near-syncope, extreme yeast problems   Celecoxib Palpitations    REACTION: palpitations   Diclofenac-Misoprostol Palpitations    REACTION: palpitation    Consent Signed: Yes.    Is patient diabetic? Yes.    CBG today? 112  Pregnant: No. LMP: No LMP recorded. Patient is postmenopausal. (age 72-55)  Anticoagulants: no Anti-inflammatory: no Antibiotics: no  Procedure: Left L3-4-5 Radiofrequency  Position: Prone Start Time: 12:55 pm  End Time: 1:10 pm  Fluoro Time: 34  RN/CMA Truman Hayward, CMA Yotam Rhine, CMA    Time 12:36 pm 1:15 pm    BP 138/78 145/74    Pulse 101 93    Respirations 16 16    O2 Sat 96 94    S/S 6 6    Pain Level 10/10 3/10     D/C home with husband, patient A & O X 3, D/C instructions reviewed, and sits independently.

## 2022-07-30 NOTE — Progress Notes (Signed)
Left L5 dorsal ramus., left L4 and left L3 medial branch radio frequency neurotomy under fluoroscopic guidance  Indication: Low back pain due to lumbar spondylosis which has been relieved on 2 occasions by greater than 50% by lumbar medial branch blocks at corresponding levels.  Informed consent was obtained after describing risks and benefits of the procedure with the patient, this includes bleeding, bruising, infection, paralysis and medication side effects. The patient wishes to proceed and has given written consent. The patient was placed in a prone position. The lumbar and sacral area was marked and prepped with Betadine. A 25-gauge 1-1/2 inch needle was inserted into the skin and subcutaneous tissue at 3 sites in one ML of 2% lidocaine was injected into each site. Then a 18-gauge 10 cm radio frequency needle with a 1 cm curved active tip was inserted targeting the left S1 SAP/sacral ala junction. Bone contact was made and confirmed with lateral imaging.  motor stimulation at 2 Hz confirm proper needle location followed by injection of one ML of 2% MPF lidocaine. Then the left L5 SAP/transverse process junction was targeted. Bone contact was made and confirmed with lateral imaging motor stimulation at 2 Hz confirm proper needle location followed by injection of one ML of the solution containing one ML of  2% MPF lidocaine. Then the left L4 SAP/transverse process junction was targeted. Bone contact was made and confirmed with lateral imaging. motor stimulation at 2 Hz confirm proper needle location followed by injection of one ML of the solution containing one ML of2% MPF lidocaine. Radio frequency lesion  at [facility] for 90 seconds was performed. Needles were removed. Post procedure instructions and vital signs were performed. Patient tolerated procedure well. Followup appointment was given. 1

## 2022-08-07 ENCOUNTER — Other Ambulatory Visit: Payer: Self-pay | Admitting: Family Medicine

## 2022-08-07 DIAGNOSIS — E1149 Type 2 diabetes mellitus with other diabetic neurological complication: Secondary | ICD-10-CM

## 2022-08-18 ENCOUNTER — Other Ambulatory Visit: Payer: Self-pay

## 2022-08-18 DIAGNOSIS — M48062 Spinal stenosis, lumbar region with neurogenic claudication: Secondary | ICD-10-CM

## 2022-08-18 DIAGNOSIS — F988 Other specified behavioral and emotional disorders with onset usually occurring in childhood and adolescence: Secondary | ICD-10-CM

## 2022-08-18 DIAGNOSIS — Q762 Congenital spondylolisthesis: Secondary | ICD-10-CM

## 2022-08-18 DIAGNOSIS — M47817 Spondylosis without myelopathy or radiculopathy, lumbosacral region: Secondary | ICD-10-CM

## 2022-08-19 MED ORDER — HYDROCODONE-ACETAMINOPHEN 7.5-325 MG PO TABS: 1.0000 | ORAL_TABLET | Freq: Three times a day (TID) | ORAL | 0 refills | Status: DC | PRN

## 2022-08-19 MED ORDER — METHYLPHENIDATE HCL 10 MG PO TABS: 20.0000 mg | ORAL_TABLET | Freq: Three times a day (TID) | ORAL | 0 refills | Status: DC

## 2022-08-27 ENCOUNTER — Ambulatory Visit (INDEPENDENT_AMBULATORY_CARE_PROVIDER_SITE_OTHER): Payer: Medicare Other

## 2022-08-27 DIAGNOSIS — Z23 Encounter for immunization: Secondary | ICD-10-CM | POA: Diagnosis not present

## 2022-08-28 NOTE — Progress Notes (Signed)
Patient presents to nurse clinic for flu vaccination. Administered in LD, site unremarkable, tolerated injection well.   Barbara Edgett C Jemiah Cuadra, RN   

## 2022-09-15 ENCOUNTER — Encounter: Payer: Self-pay | Admitting: Physical Medicine & Rehabilitation

## 2022-09-15 ENCOUNTER — Encounter: Payer: Medicare Other | Attending: Physical Medicine & Rehabilitation | Admitting: Physical Medicine & Rehabilitation

## 2022-09-15 ENCOUNTER — Other Ambulatory Visit: Payer: Self-pay

## 2022-09-15 VITALS — BP 185/96 | HR 115 | Ht 62.0 in | Wt 194.0 lb

## 2022-09-15 DIAGNOSIS — M47817 Spondylosis without myelopathy or radiculopathy, lumbosacral region: Secondary | ICD-10-CM

## 2022-09-15 DIAGNOSIS — E1159 Type 2 diabetes mellitus with other circulatory complications: Secondary | ICD-10-CM | POA: Diagnosis not present

## 2022-09-15 DIAGNOSIS — F988 Other specified behavioral and emotional disorders with onset usually occurring in childhood and adolescence: Secondary | ICD-10-CM

## 2022-09-15 DIAGNOSIS — E785 Hyperlipidemia, unspecified: Secondary | ICD-10-CM | POA: Diagnosis not present

## 2022-09-15 DIAGNOSIS — E1169 Type 2 diabetes mellitus with other specified complication: Secondary | ICD-10-CM | POA: Diagnosis not present

## 2022-09-15 DIAGNOSIS — Q762 Congenital spondylolisthesis: Secondary | ICD-10-CM

## 2022-09-15 DIAGNOSIS — E1149 Type 2 diabetes mellitus with other diabetic neurological complication: Secondary | ICD-10-CM

## 2022-09-15 DIAGNOSIS — I152 Hypertension secondary to endocrine disorders: Secondary | ICD-10-CM

## 2022-09-15 DIAGNOSIS — M533 Sacrococcygeal disorders, not elsewhere classified: Secondary | ICD-10-CM | POA: Diagnosis not present

## 2022-09-15 DIAGNOSIS — M48062 Spinal stenosis, lumbar region with neurogenic claudication: Secondary | ICD-10-CM

## 2022-09-15 NOTE — Progress Notes (Signed)
Subjective:    Patient ID: [name], female    DOB: [birth year], 71 y.o.   MRN: [id number] 07/30/22 Left L5 dorsal ramus., left L4 and left L3 medial branch radio frequency neurotomy under fluoroscopic guidance HPI  Walking tolerance improving ,standing tolerance improved as well.   PT was interrupted with a fall at her PCP office ~[place] Patient subsequently underwent bilateral medial branch blocks L3-L4 as well as L5 dorsal ramus injection under fluoroscopic guidance.  On 2 occasion the patient got greater than 50% relief of pain. Because the left-sided pain was worse and the right-sided pain the patient underwent the left L3-L4 medial branch and left L5 dorsal ramus radiofrequency neurotomy under fluoroscopic guidance proximately 6 weeks ago.  The patient has had excellent relief of her pain.  She does still have a catch in the left buttocks area but thinks that she may be able to work this out with physical therapy. Pain Inventory Average Pain 8 Pain Right Now 5 My pain is dull and aching  In the last 24 hours, has pain interfered with the following? General activity 0 Relation with others 0 Enjoyment of life 0 What TIME of day is your pain at its worst? evening, night, and varies Sleep (in general) Fair  Pain is worse with: walking, bending, standing, and some activites Pain improves with: rest, heat/ice, and medication Relief from Meds: 6  Family History  Problem Relation Age of Onset   Hypertension Mother    Arthritis Mother    Cancer Sister 524      Breast   Cancer Sister 562      Breast   Arthritis Father    Diabetes Father    Heart attack Sister    Drug abuse Sister    Colon cancer Neg Hx    Esophageal cancer Neg Hx    Rectal cancer Neg Hx    Stomach cancer Neg Hx    Social History   Socioeconomic History   Marital status: Married    Spouse name: Not on file   Number of children: Not on file   Years of education: Not on file   Highest education  level: Not on file  Occupational History   Occupation: Homemaker    Employer: RETIRED  Tobacco Use   Smoking status: Former    Packs/day: 1.00    Years: 4.00    Total pack years: 4.00    Types: Cigarettes    Quit date: 10/20/1979    Years since quitting: 42.9   Smokeless tobacco: Never  Vaping Use   Vaping Use: Never used  Substance and Sexual Activity   Alcohol use: No   Drug use: No   Sexual activity: Yes  Other Topics Concern   Not on file  Social History Narrative   Divorced.  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   Mother of 129adopted children, most with special needs   Regular exercise-no         Social Determinants of Health   Financial Resource Strain: Not on file  Food Insecurity: Not on file  Transportation Needs: Not on file  Physical Activity: Not on file  Stress: Not on file  Social Connections: Not on file   Past Surgical History:  Procedure Laterality Date   ANTERIOR CERVICAL DECOMP/DISCECTOMY FUSION  04/28/2012   Procedure: ANTERIOR CERVICAL DECOMPRESSION/DISCECTOMY FUSION 3 LEVELS;  Surgeon: Ophelia Charter, MD;  Location: [place] [facility];  Service: Neurosurgery;  Laterality: N/A;  Cervical three-four,Cervical four-five,Cervical five-six anterior cervical decompression with fusion interbody prothesis plating and bonegraft   BREAST BIOPSY  07/2009   S/P Breast Biopsy [facility], [place], 07/2009): Benign findings.  (10/27/2010)   CARPAL TUNNEL RELEASE Left 12/10/2016   Procedure: LEFT CARPAL TUNNEL RELEASE;  Surgeon: Daryll Brod, MD;  Location: [place];  Service: Orthopedics;  Laterality: Left;   COLONOSCOPY W/ POLYPECTOMY  12/2000   colonoscopy (Dr Collene Mares) int. hemorrhoids &  nonneoplatic colon polyps - 01/10/2001,    COLONOSCOPY W/ POLYPECTOMY  01/2012   Dr Hilarie Fredrickson (GI).sigmoid colon, hyperlastic polyp    ENDOMETRIAL BIOPSY  08/2004   Endometrial Biopsy - 09/01/2004,   NM MYOVIEW LTD  04/2004   Cardiolite:EF63%, no ischemia - 05/01/2004,    REPLACEMENT TOTAL KNEE BILATERAL     Past Surgical History:  Procedure Laterality Date   ANTERIOR CERVICAL DECOMP/DISCECTOMY FUSION  04/28/2012   Procedure: ANTERIOR CERVICAL DECOMPRESSION/DISCECTOMY FUSION 3 LEVELS;  Surgeon: Ophelia Charter, MD;  Location: [place] [facility];  Service: Neurosurgery;  Laterality: N/A;  Cervical three-four,Cervical four-five,Cervical five-six anterior cervical decompression with fusion interbody prothesis plating and bonegraft   BREAST BIOPSY  07/2009   S/P Breast Biopsy [facility], [place], 07/2009): Benign findings.  (10/27/2010)   CARPAL TUNNEL RELEASE Left 12/10/2016   Procedure: LEFT CARPAL TUNNEL RELEASE;  Surgeon: Daryll Brod, MD;  Location: [place];  Service: Orthopedics;  Laterality: Left;   COLONOSCOPY W/ POLYPECTOMY  12/2000   colonoscopy (Dr Collene Mares) int. hemorrhoids &  nonneoplatic colon polyps - 01/10/2001,    COLONOSCOPY W/ POLYPECTOMY  01/2012   Dr Hilarie Fredrickson (GI).sigmoid colon, hyperlastic polyp   ENDOMETRIAL BIOPSY  08/2004   Endometrial Biopsy - 09/01/2004,   NM MYOVIEW LTD  04/2004   Cardiolite:EF63%, no ischemia - 05/01/2004,    REPLACEMENT TOTAL KNEE BILATERAL     Past Medical History:  Diagnosis Date   Acute MEE (middle ear effusion) 06/07/2014   Acute recurrent sinusitis 12/21/2014   Acute sinusitis 12/21/2014   Allergy    Anemia    hx   Asthma, intermittent 11/02/2018   ATTENTION DEFICIT, W/O HYPERACTIVITY 12/16/2006   Bilateral carpal tunnel syndrome 11/20/2016   Cataract    Cervical spondylosis with myelopathy, History of 04/28/2012   COLON POLYP 12/16/2006   (02/03/12) Surgical [Pathology], sigmoid colon, polyp HYPERPLASTIC POLYP(S) AND POLYPOID FRAGMETNS OF BENIGN COLONIC MUCOSA WITH INTRAMUCOSAL LYMPHOID AGGREGATES. NO ADENOMATOUS CHANGE OR MALIGNANCY IDENTIFIED.     DIABETES MELLITUS II, UNCOMPLICATED 64/33/2951   DIFFUSE IDIOPATHIC SKELETAL HYPEROSTOSIS 03/29/2008   Fall 10/18/2020   GERD (gastroesophageal reflux disease)    H/O abnormal mammogram 07/19/2009   S/P Breast Biopsy [facility], [place], 07/2009): Benign findings.     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   HEMORRHOIDS, NOS 12/16/2006   Qualifier: History of  By: McDiarmid MD, Sherren Mocha     History of blood transfusion 10/2004   with knee replacement   History of peptic ulcer 12/16/2006   UGI series PUD - 10/19/1998     History of right greater trochanteric bursitis 11/07/2009   HYPERCHOLESTEROLEMIA 02/28/2007   HYPERTENSION, BENIGN SYSTEMIC 12/16/2006   Hyponatremia 08/30/2015   INCONTINENCE, URGE 12/16/2006   Qualifier: History of  By: McDiarmid MD, Todd     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/20/2016  OSTEOARTHRITIS, MULTI SITES 12/16/2006   Perennial allergic rhinitis with seasonal variation 12/16/2006        PONV (postoperative nausea and vomiting)    last surgery only   Recurrent maxillary sinusitis    SACROILIITIS, HISTORY OF 01/08/2009   Qualifier: History of  By: McDiarmid MD, Todd     Seasonal allergies    Seborrheic keratosis 01/15/2016   Ulcer    Uterine fibroid 02/02/2011   TVUS: 4cm fibroid w/ submucosal component, bil.hydrosalpinges, unable measurable endometrium - 08/18/2004    Ht '5\' 2"'$  (1.575 m)   Wt 194 lb (88 kg)   BMI 35.48 kg/m   Opioid Risk Score:   Fall Risk Score:  `1  Depression screen [facility] 2/9     09/15/2022   12:27 PM 06/05/2022   11:25 AM 04/16/2022    2:35 PM 03/10/2022   11:13 AM 11/13/2021    3:10 PM 10/17/2020    2:40 PM 03/07/2020   10:15 AM  Depression screen PHQ 2/9  Decreased Interest 0 0 0 0 0 0 0  Down, Depressed, Hopeless 0 0 0 0 0 0 0  PHQ - 2 Score 0 0 0 0 0 0 0  Altered sleeping    0 0 0   Tired, decreased energy    0 1 1   Change in appetite    0 0 1   Feeling bad or failure about yourself     0 0 0   Trouble  concentrating    0 0 0   Moving slowly or fidgety/restless    0  0   Suicidal thoughts    0 0 0   PHQ-9 Score    0 1 2   Difficult doing work/chores     Not difficult at all Somewhat difficult       Review of Systems  Musculoskeletal:  Positive for back pain.  All other systems reviewed and are negative.     Objective:   Physical Exam  No tenderness along the lumbar paraspinal musculature.  No pain in the lumbar area with flexion extension of the spine. General no acute distress Mood and affect appropriate Ambulates with a rolling walker no evidence of toe drag or knee instability Negative straight leg raising Gaenslens: Negative bilaterally  sacral thrust (prone) : Positive on left  FABER's: Positive on left at SI Distraction (supine): Negative Thigh thrust test: Positive on the left at [facility]      Assessment & Plan:  1.  Lumbar spondylosis without myelopathy improved on the left side status post radiofrequency neurotomy which denervated the L4-5 and L5-S1 segments.  The patient now has pain that is lower, exam consistent with sacroiliac pain.   Pt states she has order for PT from PCP If no improvement in 2-3 mo consider Left SI injection with fluoro guidance

## 2022-09-17 ENCOUNTER — Ambulatory Visit (INDEPENDENT_AMBULATORY_CARE_PROVIDER_SITE_OTHER): Payer: Medicare Other | Admitting: Family Medicine

## 2022-09-17 ENCOUNTER — Encounter: Payer: Self-pay | Admitting: Family Medicine

## 2022-09-17 VITALS — BP 135/80 | HR 108 | Wt 192.1 lb

## 2022-09-17 DIAGNOSIS — E1159 Type 2 diabetes mellitus with other circulatory complications: Secondary | ICD-10-CM | POA: Diagnosis not present

## 2022-09-17 DIAGNOSIS — M47817 Spondylosis without myelopathy or radiculopathy, lumbosacral region: Secondary | ICD-10-CM | POA: Diagnosis not present

## 2022-09-17 DIAGNOSIS — I152 Hypertension secondary to endocrine disorders: Secondary | ICD-10-CM

## 2022-09-17 DIAGNOSIS — Z1231 Encounter for screening mammogram for malignant neoplasm of breast: Secondary | ICD-10-CM

## 2022-09-17 DIAGNOSIS — E1169 Type 2 diabetes mellitus with other specified complication: Secondary | ICD-10-CM

## 2022-09-17 DIAGNOSIS — M48062 Spinal stenosis, lumbar region with neurogenic claudication: Secondary | ICD-10-CM | POA: Diagnosis not present

## 2022-09-17 DIAGNOSIS — E785 Hyperlipidemia, unspecified: Secondary | ICD-10-CM

## 2022-09-17 DIAGNOSIS — E1149 Type 2 diabetes mellitus with other diabetic neurological complication: Secondary | ICD-10-CM

## 2022-09-17 LAB — POCT GLYCOSYLATED HEMOGLOBIN (HGB A1C): HbA1c, POC (controlled diabetic range): 6.8 % (ref 0.0–7.0)

## 2022-09-17 MED ORDER — HYDROCODONE-ACETAMINOPHEN 7.5-325 MG PO TABS: 1.0000 | ORAL_TABLET | Freq: Three times a day (TID) | ORAL | 0 refills | Status: DC | PRN

## 2022-09-17 MED ORDER — METHYLPHENIDATE HCL 10 MG PO TABS: 20.0000 mg | ORAL_TABLET | Freq: Three times a day (TID) | ORAL | 0 refills | Status: DC

## 2022-09-17 MED ORDER — LOSARTAN POTASSIUM 50 MG PO TABS: 50.0000 mg | ORAL_TABLET | Freq: Every day | ORAL | 3 refills | Status: DC

## 2022-09-17 NOTE — Patient Instructions (Addendum)
Stop the Lotrel. Start the Losartan 50 mg tablet, one tablet a day. Please return in 2 weeks to see how you are tolerating the Losartan and how well it is controlling your blood pressure.      It is time for your mammogram to look for breast cancer.    It is time for your eye exam to make sure there are no diabetes damage.  I have in my records you have seen Dr Satira Sark in [place] and Dr George Ina in [place] for your eye exams.       Tetanus Boost  It is time for your Tetanus booster.  The vaccination also contains a booster to prevent aults from giving whooping cough (pertussis) to babies.  If you think you will be around babies less than 3 months old in the next 10 years, I would recommend you getting the vaccination.  The vaccination is covered 100% by Medicare.

## 2022-09-18 ENCOUNTER — Encounter: Payer: Self-pay | Admitting: Family Medicine

## 2022-09-18 NOTE — Assessment & Plan Note (Addendum)
Error

## 2022-09-18 NOTE — Addendum Note (Signed)
Addended byWendy Poet, Nataleah Scioneaux D on: 09/18/2022 04:25 PM   Modules accepted: Level of Service

## 2022-09-18 NOTE — Assessment & Plan Note (Signed)
Lab Results  Component Value Date   HGBA1C 6.8 09/17/2022  Established problem Well Controlled and is at goal of A1c < 8.0%. No signs of complications, medication side effects, or red flags. Continue current medications and other regiments.

## 2022-09-18 NOTE — Progress Notes (Signed)
[name] is alone Sources of clinical information for visit is/are patient. Nursing assessment for this office visit was reviewed with the patient for accuracy and revision.     Previous Report(s) Reviewed: none     09/17/2022    3:26 PM  Depression screen PHQ 2/9  Decreased Interest 0  Down, Depressed, Hopeless 0  PHQ - 2 Score 0  Altered sleeping 0  Tired, decreased energy 1  Change in appetite 0  Feeling bad or failure about yourself  0  Trouble concentrating 0  Moving slowly or fidgety/restless 0  Suicidal thoughts 0  PHQ-9 Score 1  Difficult doing work/chores Not difficult at all   [place] Visit from 09/17/2022 in [place] Office Visit from 03/10/2022 in [place] [facility] Office Visit from 11/13/2021 in [place]  Thoughts that you would be better off dead, or of hurting yourself in some way Not at all Not at all Not at all  PHQ-9 Total Score 1 0 1          09/17/2022    3:27 PM 09/15/2022   12:27 PM 06/05/2022   11:25 AM 05/14/2022    2:42 PM 04/16/2022    2:35 PM  [place] in the past year? 1 0 0 0 0  Number falls in past yr: 0  0  0  Injury with Fall? 1  0  0       09/17/2022    3:26 PM 09/15/2022   12:27 PM 06/05/2022   11:25 AM  PHQ9 SCORE ONLY  PHQ-9 Total Score 1 0 0    There are no preventive care reminders to display for this patient.  Health Maintenance Due  Topic Date Due   Diabetic kidney evaluation - Urine ACR  Never done   Zoster Vaccines- Shingrix (1 of 2) Never done   DTaP/Tdap/Td (2 - Tdap) 01/18/2012   Medicare Annual Wellness (AWV)  09/29/2017   MAMMOGRAM  10/15/2018   OPHTHALMOLOGY EXAM  11/09/2020   FOOT EXAM  10/17/2021   COVID-19 Vaccine (6 - 2023-24 season) 08/14/2022      History/P.E. limitations: none  There are no preventive care reminders to display for this patient.  Diabetes Health Maintenance Due  Topic Date Due    OPHTHALMOLOGY EXAM  11/09/2020   FOOT EXAM  10/17/2021   HEMOGLOBIN A1C  03/18/2023   LIPID PANEL  11/08/2024    Health Maintenance Due  Topic Date Due   Diabetic kidney evaluation - Urine ACR  Never done   Zoster Vaccines- Shingrix (1 of 2) Never done   DTaP/Tdap/Td (2 - Tdap) 01/18/2012   Medicare Annual Wellness (AWV)  09/29/2017   MAMMOGRAM  10/15/2018   OPHTHALMOLOGY EXAM  11/09/2020   FOOT EXAM  10/17/2021   COVID-19 Vaccine (6 - 2023-24 season) 08/14/2022     Chief Complaint  Patient presents with   Follow-up     --------------------------------------------------------------------------------------------------------------------------------------------- Visit Problem List with A/P  No problem-specific Assessment & Plan notes found for this encounter.

## 2022-09-18 NOTE — Assessment & Plan Note (Signed)
Established problem Well Controlled and is at goal of BP <140/90. No signs of complications, medication side effects, or red flags. Continue current medications and other regiments.

## 2022-09-18 NOTE — Assessment & Plan Note (Addendum)
Established problem Good response with Dorsal root ablation therapy by Dr Read Drivers ([facility]) Indication for chronic opioid: Lumbar spondylosis, spinal stenosis, facet arhtropathy Adequate analegia to complete ADLs/iADLs,using rollator for ambulation, no Adverse effects,  Medication and dose: hydrocodone/APAP 7.5 mg/325  # pills per month: 90 Date narcotic database last reviewed (include red flags): reviewed and no evidence doctor shopping.   Start discussion about very slow taper in number of tablets per month with goal of reduction to 60 tab per month.

## 2022-09-18 NOTE — Assessment & Plan Note (Signed)
Established problem. stable No signs of complications, medication side effects, or red flags. Continue current medications and other regiments.

## 2022-09-18 NOTE — Assessment & Plan Note (Signed)
Established problem Indication for chronic opioid: Lumbar spondylosis, spinal stenosis, facet arhtropathy Adequate analegia to complete ADLs/iADLs,using rollator for ambulation, no Adverse effects,  Medication and dose: hydrocodone/APAP 7.5 mg/325  # pills per month: 90 Date narcotic database last reviewed (include red flags): reviewed and no evidence doctor shopping.

## 2022-09-20 LAB — BASIC METABOLIC PANEL
BUN/Creatinine Ratio: 17 (ref 12–28)
BUN: 12 mg/dL (ref 8–27)
CO2: 21 mmol/L (ref 20–29)
Calcium: 9.6 mg/dL (ref 8.7–10.3)
Chloride: 105 mmol/L (ref 96–106)
Creatinine, Ser: 0.71 mg/dL (ref 0.57–1.00)
Glucose: 135 mg/dL — ABNORMAL HIGH (ref 70–99)
Potassium: 4.3 mmol/L (ref 3.5–5.2)
Sodium: 143 mmol/L (ref 134–144)
eGFR: 90 mL/min/{1.73_m2} (ref >59)

## 2022-09-20 LAB — MICROALBUMIN / CREATININE URINE RATIO
Creatinine, Urine: 78.9 mg/dL
Microalb/Creat Ratio: 483 mg/g creat — ABNORMAL HIGH (ref 0–29)
Microalbumin, Urine: 381.4 ug/mL

## 2022-09-22 ENCOUNTER — Encounter: Payer: Self-pay | Admitting: Family Medicine

## 2022-09-22 DIAGNOSIS — R809 Proteinuria, unspecified: Secondary | ICD-10-CM | POA: Insufficient documentation

## 2022-10-01 ENCOUNTER — Ambulatory Visit (INDEPENDENT_AMBULATORY_CARE_PROVIDER_SITE_OTHER): Payer: Medicare Other | Admitting: Family Medicine

## 2022-10-01 ENCOUNTER — Telehealth: Payer: Self-pay | Admitting: Family Medicine

## 2022-10-01 ENCOUNTER — Encounter: Payer: Self-pay | Admitting: Family Medicine

## 2022-10-01 VITALS — BP 153/70 | HR 98 | Ht 62.0 in | Wt 197.8 lb

## 2022-10-01 DIAGNOSIS — E1159 Type 2 diabetes mellitus with other circulatory complications: Secondary | ICD-10-CM | POA: Diagnosis not present

## 2022-10-01 DIAGNOSIS — I152 Hypertension secondary to endocrine disorders: Secondary | ICD-10-CM | POA: Diagnosis not present

## 2022-10-01 DIAGNOSIS — B3731 Acute candidiasis of vulva and vagina: Secondary | ICD-10-CM

## 2022-10-01 MED ORDER — FLUCONAZOLE 150 MG PO TABS: ORAL_TABLET | ORAL | 3 refills | Status: DC

## 2022-10-01 MED ORDER — LOSARTAN POTASSIUM 50 MG PO TABS: 100.0000 mg | ORAL_TABLET | Freq: Every day | ORAL | 3 refills | Status: DC

## 2022-10-01 NOTE — Patient Instructions (Signed)
Increase your losartan to two tablets a day.  Continue taking your HCTZ one tablet a day.

## 2022-10-01 NOTE — Telephone Encounter (Signed)
Patient dropped off form at front desk for [place].  Verified that patient section of form has been completed.  Last DOS/WCC with PCP was 10/01/22.  Placed form in blue team folder to be completed by clinical staff.  Creig Hines

## 2022-10-02 ENCOUNTER — Encounter: Payer: Self-pay | Admitting: Family Medicine

## 2022-10-02 DIAGNOSIS — B3731 Acute candidiasis of vulva and vagina: Secondary | ICD-10-CM

## 2022-10-02 HISTORY — DX: Acute candidiasis of vulva and vagina: B37.31

## 2022-10-02 NOTE — Progress Notes (Signed)
[name] is alone Sources of clinical information for visit is/are patient. Nursing assessment for this office visit was reviewed with the patient for accuracy and revision.     Previous Report(s) Reviewed: none     10/01/2022   11:38 AM  Depression screen PHQ 2/9  Decreased Interest 0  Down, Depressed, Hopeless 0  PHQ - 2 Score 0  Altered sleeping 0  Tired, decreased energy 1  Change in appetite 1  Feeling bad or failure about yourself  0  Trouble concentrating 0  Moving slowly or fidgety/restless 0  Suicidal thoughts 0  PHQ-9 Score 2   [place] Office Visit from 10/01/2022 in [place] Office Visit from 09/17/2022 in [place] Office Visit from 03/10/2022 in [place] [facility]  Thoughts that you would be better off dead, or of hurting yourself in some way Not at all Not at all Not at all  PHQ-9 Total Score 2 1 0          10/01/2022   11:38 AM 09/17/2022    3:27 PM 09/15/2022   12:27 PM 06/05/2022   11:25 AM 05/14/2022    2:42 PM  Fall Risk   Falls in the past year? 0 1 0 0 0  Number falls in past yr: 0 0  0   Injury with Fall? 0 1  0        10/01/2022   11:38 AM 09/17/2022    3:26 PM 09/15/2022   12:27 PM  PHQ9 SCORE ONLY  PHQ-9 Total Score 2 1 0    There are no preventive care reminders to display for this patient.  Health Maintenance Due  Topic Date Due   Zoster Vaccines- Shingrix (1 of 2) Never done   DTaP/Tdap/Td (2 - Tdap) 01/18/2012   Medicare Annual Wellness (AWV)  09/29/2017   MAMMOGRAM  10/15/2018   OPHTHALMOLOGY EXAM  11/09/2020   FOOT EXAM  10/17/2021   COVID-19 Vaccine (6 - 2023-24 season) 08/14/2022      History/P.E. limitations: none  There are no preventive care reminders to display for this patient.  Diabetes Health Maintenance Due  Topic Date Due   OPHTHALMOLOGY EXAM  11/09/2020   FOOT EXAM  10/17/2021   HEMOGLOBIN A1C  03/18/2023   LIPID  PANEL  11/08/2024    Health Maintenance Due  Topic Date Due   Zoster Vaccines- Shingrix (1 of 2) Never done   DTaP/Tdap/Td (2 - Tdap) 01/18/2012   Medicare Annual Wellness (AWV)  09/29/2017   MAMMOGRAM  10/15/2018   OPHTHALMOLOGY EXAM  11/09/2020   FOOT EXAM  10/17/2021   COVID-19 Vaccine (6 - 2023-24 season) 08/14/2022     Chief Complaint  Patient presents with   Bp f/u     --------------------------------------------------------------------------------------------------------------------------------------------- Visit Problem List with A/P  No problem-specific Assessment & Plan notes found for this encounter.

## 2022-10-02 NOTE — Assessment & Plan Note (Signed)
BP Readings from Last 3 Encounters:  10/01/22 (!) 153/70  09/17/22 135/80  09/15/22 (!) 185/96  Established problem Uncontrolled.  Patient is not at goal of <140/90.  Stopped Lotrel two weeks ago because of insurer formulary and edema from amlodipine. Continued losartan 50 mg daily with HCTZ 25 mg daily Not checking home BPs No chest pain, no facial/limb weakness  Plan Increase losartan 50 mg tab to two tablets daily  Continue: HCTZ 25 mg daily

## 2022-10-02 NOTE — Assessment & Plan Note (Signed)
New problem Vaginal discharge, irritative, curd-like Similar to yeast infections in past  Plan Diflucan 150 mg today then 150 mg again in three days

## 2022-10-02 NOTE — Telephone Encounter (Signed)
Clinical info completed on [place] placard form.  Placed form in PCP's box for completion.    When form is completed, please route note to "RN Team" and place in wall pocket in front office.   Salvatore Marvel, CMA

## 2022-10-05 NOTE — Telephone Encounter (Signed)
Form signed and placed in RN box in front office.

## 2022-10-05 NOTE — Telephone Encounter (Signed)
Patient called, LVM and informed that forms are ready for pick up. Copy made and placed in batch scanning. Original placed at front desk for pick up.   Talbot Grumbling, RN

## 2022-10-06 ENCOUNTER — Other Ambulatory Visit: Payer: Self-pay | Admitting: Family Medicine

## 2022-10-06 DIAGNOSIS — M5416 Radiculopathy, lumbar region: Secondary | ICD-10-CM

## 2022-10-15 ENCOUNTER — Other Ambulatory Visit: Payer: Self-pay

## 2022-10-15 DIAGNOSIS — M48062 Spinal stenosis, lumbar region with neurogenic claudication: Secondary | ICD-10-CM

## 2022-10-15 DIAGNOSIS — M47817 Spondylosis without myelopathy or radiculopathy, lumbosacral region: Secondary | ICD-10-CM

## 2022-10-15 DIAGNOSIS — F988 Other specified behavioral and emotional disorders with onset usually occurring in childhood and adolescence: Secondary | ICD-10-CM

## 2022-10-15 DIAGNOSIS — Q762 Congenital spondylolisthesis: Secondary | ICD-10-CM

## 2022-10-16 MED ORDER — HYDROCODONE-ACETAMINOPHEN 7.5-325 MG PO TABS: 1.0000 | ORAL_TABLET | Freq: Three times a day (TID) | ORAL | 0 refills | Status: DC | PRN

## 2022-10-16 MED ORDER — METHYLPHENIDATE HCL 10 MG PO TABS: 20.0000 mg | ORAL_TABLET | Freq: Three times a day (TID) | ORAL | 0 refills | Status: DC

## 2022-10-22 DIAGNOSIS — H43813 Vitreous degeneration, bilateral: Secondary | ICD-10-CM | POA: Diagnosis not present

## 2022-11-12 ENCOUNTER — Encounter: Payer: Self-pay | Admitting: *Deleted

## 2022-11-12 ENCOUNTER — Telehealth: Payer: Self-pay | Admitting: *Deleted

## 2022-11-12 DIAGNOSIS — Z7689 Persons encountering health services in other specified circumstances: Secondary | ICD-10-CM

## 2022-11-12 NOTE — Patient Outreach (Addendum)
  Care Coordination   Initial Visit Note   11/12/2022 Name: MCKINLEY ADELSTEIN MRN: [id number] DOB: 04/11/1951  HOPIE PELLEGRIN is a 72 y.o. year old female who sees McDiarmid, Blane Ohara, MD for primary care. I spoke with  [name] by phone today.  What matters to the patients health and wellness today?  Improving my health and getting some dental care done.    Goals Addressed             This Visit's Progress    I will work with my [place] to improve my diabetes over the next 3 months.       Care Coordination Interventions: Reviewed medications with patient and discussed importance of medication adherence Discussed plans with patient for ongoing care management follow up and provided patient with direct contact information for care management team Referral made to community resources care guide team for assistance with DENTAL CARE. Review of patient status, including review of consultants reports, relevant laboratory and other test results, and medications completed Screening for signs and symptoms of depression related to chronic disease state  Assessed social determinant of health barriers           SDOH assessments and interventions completed:  Yes  SDOH Interventions Today    Flowsheet Row Most Recent Value  SDOH Interventions   Food Insecurity Interventions Intervention Not Indicated  Housing Interventions Intervention Not Indicated  Transportation Interventions Intervention Not Indicated  Utilities Interventions Intervention Not Indicated  Financial Strain Interventions Intervention Not Indicated  Physical Activity Interventions Other (Comments)  [Agrees to work on this with NP]  Stress Interventions Intervention Not Indicated  Social Connections Interventions Intervention Not Indicated        Care Coordination Interventions:  Yes, provided   Follow up plan: Follow up call scheduled for 11/24/22 at 11:00 am.    Encounter Outcome:  Pt. Visit Completed    [name] C. Myrtie Neither, MSN, [facility] Gerontological Nurse Practitioner [facility] Care Management [phone]

## 2022-11-13 ENCOUNTER — Telehealth: Payer: Self-pay

## 2022-11-13 NOTE — Telephone Encounter (Signed)
   Telephone encounter was:  Unsuccessful.  11/13/2022 Name: [name] MRN: [id number] DOB: [birth year]  Unsuccessful outbound call made today to assist with:   dental  Outreach Attempt:  1st Attempt Unable to [place], [place] [facility]  [phone] [address], [place], [place] [address] Phone: [phone] Email: [email or web address]

## 2022-11-16 ENCOUNTER — Other Ambulatory Visit: Payer: Self-pay

## 2022-11-16 ENCOUNTER — Telehealth: Payer: Self-pay

## 2022-11-16 DIAGNOSIS — F988 Other specified behavioral and emotional disorders with onset usually occurring in childhood and adolescence: Secondary | ICD-10-CM

## 2022-11-16 DIAGNOSIS — M47817 Spondylosis without myelopathy or radiculopathy, lumbosacral region: Secondary | ICD-10-CM

## 2022-11-16 DIAGNOSIS — Q762 Congenital spondylolisthesis: Secondary | ICD-10-CM

## 2022-11-16 DIAGNOSIS — M48062 Spinal stenosis, lumbar region with neurogenic claudication: Secondary | ICD-10-CM

## 2022-11-16 MED ORDER — METHYLPHENIDATE HCL 10 MG PO TABS: 20.0000 mg | ORAL_TABLET | Freq: Three times a day (TID) | ORAL | 0 refills | Status: DC

## 2022-11-16 MED ORDER — HYDROCODONE-ACETAMINOPHEN 7.5-325 MG PO TABS: 1.0000 | ORAL_TABLET | Freq: Three times a day (TID) | ORAL | 0 refills | Status: DC | PRN

## 2022-11-16 NOTE — Telephone Encounter (Signed)
   Telephone encounter was:  Successful.  11/16/2022 Name: [name] MRN: [id number] DOB: 24-Feb-1951  [name] is a 72 y.o. year old female who is a primary care patient of McDiarmid, Blane Ohara, MD . The community resource team was consulted for assistance with  Dental  Care guide performed the following interventions: Patient provided with information about care guide support team and interviewed to confirm resource needs.Patient stated she needs dental work done. She needs a provider that takes Medicaid   Follow Up Plan:  Care guide will follow up with patient by phone over the next day    [place], [facility]  [phone] [address], [place], [place] [address] Phone: [phone] Email: [email or web address]

## 2022-11-19 ENCOUNTER — Telehealth: Payer: Self-pay | Admitting: *Deleted

## 2022-11-20 ENCOUNTER — Telehealth: Payer: Self-pay

## 2022-11-24 ENCOUNTER — Encounter: Payer: Self-pay | Admitting: *Deleted

## 2022-11-26 ENCOUNTER — Encounter: Payer: Self-pay | Admitting: *Deleted

## 2022-11-26 ENCOUNTER — Ambulatory Visit (INDEPENDENT_AMBULATORY_CARE_PROVIDER_SITE_OTHER): Payer: Medicare Other | Admitting: Family Medicine

## 2022-11-26 ENCOUNTER — Telehealth: Payer: Self-pay | Admitting: *Deleted

## 2022-11-26 ENCOUNTER — Encounter: Payer: Self-pay | Admitting: Family Medicine

## 2022-11-26 VITALS — BP 167/80 | HR 104 | Ht 62.0 in | Wt 201.4 lb

## 2022-11-26 DIAGNOSIS — R0602 Shortness of breath: Secondary | ICD-10-CM | POA: Diagnosis not present

## 2022-11-26 DIAGNOSIS — E1159 Type 2 diabetes mellitus with other circulatory complications: Secondary | ICD-10-CM

## 2022-11-26 DIAGNOSIS — R601 Generalized edema: Secondary | ICD-10-CM | POA: Diagnosis not present

## 2022-11-26 DIAGNOSIS — E1149 Type 2 diabetes mellitus with other diabetic neurological complication: Secondary | ICD-10-CM | POA: Diagnosis not present

## 2022-11-26 DIAGNOSIS — Z0000 Encounter for general adult medical examination without abnormal findings: Secondary | ICD-10-CM | POA: Diagnosis not present

## 2022-11-26 DIAGNOSIS — R600 Localized edema: Secondary | ICD-10-CM | POA: Diagnosis not present

## 2022-11-26 DIAGNOSIS — I152 Hypertension secondary to endocrine disorders: Secondary | ICD-10-CM

## 2022-11-26 MED ORDER — CARVEDILOL 6.25 MG PO TABS: 6.2500 mg | ORAL_TABLET | Freq: Two times a day (BID) | ORAL | 3 refills | Status: DC

## 2022-11-26 MED ORDER — FUROSEMIDE 20 MG PO TABS: 20.0000 mg | ORAL_TABLET | ORAL | 3 refills | Status: DC

## 2022-11-26 NOTE — Assessment & Plan Note (Signed)
Established problem Uncontrolled.  Patient is not at goal of <140/90. Patient reluctant to start back Amlodipine because of how it made her feel. Patient on maximum doses of Losartan and HCTZ  Start: Carvedilol 6.25 po twice a day, Lasix 20 mg QOD  Continue: Losartan and HCTZ at current doses RTC 2 wks

## 2022-11-26 NOTE — Patient Instructions (Addendum)
Your blood pressure remains high, and you have some swelling in your legs.  Start the water pill called furosemide.  Lets see if it helps with the swelling in your legs and helps lower your blood pressure.  Take the furosemide tablet, one tablet every other day.    Start Carvedilol, a blood pressure medication, to help with your high blood pressure.  Let's see if we can use carvedilol in place of your old amlodipine.    We are checking lab work to make sure your leg swelling is not coming from your heart, kidneys, liver, or thyroid.

## 2022-11-26 NOTE — Progress Notes (Signed)
[name] is alone Sources of clinical information for visit is/are patient. Nursing assessment for this office visit was reviewed with the patient for accuracy and revision.     Previous Report(s) Reviewed: none     11/26/2022   10:31 AM  Depression screen PHQ 2/9  Decreased Interest 0  Down, Depressed, Hopeless 0  PHQ - 2 Score 0  Altered sleeping 0  Tired, decreased energy 1  Change in appetite 0  Feeling bad or failure about yourself  0  Trouble concentrating 0  Moving slowly or fidgety/restless 1  Suicidal thoughts 0  PHQ-9 Score 2  Difficult doing work/chores Somewhat difficult   [place] Office Visit from 11/26/2022 in [place] Office Visit from 10/01/2022 in [place] Office Visit from 09/17/2022 in [place]  Thoughts that you would be better off dead, or of hurting yourself in some way Not at all Not at all Not at all  PHQ-9 Total Score '2 2 1          '$ 11/26/2022   10:30 AM 10/01/2022   11:38 AM 09/17/2022    3:27 PM 09/15/2022   12:27 PM 06/05/2022   11:25 AM  [place] in the past year? 0 0 1 0 0  Number falls in past yr: 0 0 0  0  Injury with Fall? 0 0 1  0       11/26/2022   10:31 AM 11/12/2022    4:24 PM 10/01/2022   11:38 AM  PHQ9 SCORE ONLY  PHQ-9 Total Score 2 0 2    There are no preventive care reminders to display for this patient.  Health Maintenance Due  Topic Date Due   Zoster Vaccines- Shingrix (1 of 2) Never done   DTaP/Tdap/Td (2 - Tdap) 01/18/2012   Medicare Annual Wellness (AWV)  09/29/2017   MAMMOGRAM  10/15/2018   OPHTHALMOLOGY EXAM  11/09/2020   FOOT EXAM  10/17/2021      History/P.E. limitations: none  There are no preventive care reminders to display for this patient.  Diabetes Health Maintenance Due  Topic Date Due   OPHTHALMOLOGY EXAM  11/09/2020   FOOT EXAM  10/17/2021   HEMOGLOBIN A1C  03/18/2023   LIPID PANEL  11/08/2024     Health Maintenance Due  Topic Date Due   Zoster Vaccines- Shingrix (1 of 2) Never done   DTaP/Tdap/Td (2 - Tdap) 01/18/2012   Medicare Annual Wellness (AWV)  09/29/2017   MAMMOGRAM  10/15/2018   OPHTHALMOLOGY EXAM  11/09/2020   FOOT EXAM  10/17/2021     Chief Complaint  Patient presents with   Hypertension   Edema    Weight: 201 lb 6 oz (91.3 kg)   Temp Readings from Last 3 Encounters:  06/05/22 98.8 F (37.1 C)  05/25/22 (!) 96.8 F (36 C) (Temporal)  04/16/22 99.2 F (37.3 C)   BP Readings from Last 3 Encounters:  11/26/22 (!) 167/80  10/01/22 (!) 153/70  09/17/22 135/80   Pulse Readings from Last 3 Encounters:  11/26/22 (!) 104  10/01/22 98  09/17/22 (!) 108   Leg edema - Location: Bilateral - Level: lower third of foreleg - Onset: gradual 2 month - Progression: stable - Treatment: Diuretic medication none                        Elevation: yes, Minimally improvement with elevation and Minimally  improved overnight in bed            Compression with none:            Low Salt Diet Not on formal low sodium diet. Does already watch for salt in diet.   - Leg dependency during day: no  - PMH:   Heart Failure:no  Liver Disease: no  Advanced Renal Disease: no  Protein Malnutrition: no  Hypoalbuminemia:no  Chronic Venous Insufficiency: not diagnosed    History of chronic leg edema:no  Thyroid disease: no  Anemia:no   - Associated Symptoms:   Dyspnea: yes, during day  Orthopnea:no  DOE: no  Abdominal swelling:no    Well's DVT Criteria: Malignancy, Recent Immobilization, Recent bedridden or  major surgery, Entire leg swelling, one calf swollen more (>3cm) than other, Hx  of prior DVT, Varicose Veins, or No other more likely diagnosis than DVT? no    Medications  Is patient on DHP CCB, Hydralazine, Pregabalin, Megace, Thiazolidinedione,  NSAID, Systemic Corticosteroid or Estrogen? no  Recent weight trends:  Filed Weights   11/26/22 1030  Weight: 201 lb 6  oz (91.3 kg)    Most Recent Labs                Latest Ref Rng & Units 09/17/2022    5:08 PM 11/13/2021    4:15 PM 10/17/2020    3:52 PM  BMP  Glucose 70 - 99 mg/dL 135  157  125   BUN 8 - 27 mg/dL '12  16  11   '$ Creatinine 0.57 - 1.00 mg/dL 0.71  0.81  0.66   BUN/Creat Ratio 12 - '28 17  20  17   '$ Sodium 134 - 144 mmol/L 143  144  139   Potassium 3.5 - 5.2 mmol/L 4.3  4.0  3.7   Chloride 96 - 106 mmol/L 105  103  101   CO2 20 - 29 mmol/L '21  20  23   '$ Calcium 8.7 - 10.3 mg/dL 9.6  9.9  10.0                     Latest Ref Rng & Units 09/17/2022    5:08 PM 11/13/2021    4:15 PM 10/17/2020    3:52 PM  CMP  Glucose 70 - 99 mg/dL 135  157  125   BUN 8 - 27 mg/dL '12  16  11   '$ Creatinine 0.57 - 1.00 mg/dL 0.71  0.81  0.66   Sodium 134 - 144 mmol/L 143  144  139   Potassium 3.5 - 5.2 mmol/L 4.3  4.0  3.7   Chloride 96 - 106 mmol/L 105  103  101   CO2 20 - 29 mmol/L '21  20  23   '$ Calcium 8.7 - 10.3 mg/dL 9.6  9.9  10.0                 Lab Results  Component Value Date   TSH 1.400 03/07/2020     BNP (last 3 results)  No results for input(s): "BNP" in the last 8760 hours.   ProBNP (last 3 results)  No results for input(s): "PROBNP" in the last 8760 hours.  ROS Denies Pain in Legs Denies Leg Rash Denies Itching Legs   General: in no apparent distress Eyes: No periorbital edema Respiratory: Clear to auscultation.  No rales, rhonchi, or wheezing. Cardiovascular: Heart regular rate and rhythm Murmur(s)-  faint murmur LUSB without rad;  Legs:  1+ bilateral pedal edema with both pitting and woody feel to palpation, one third way up foreleg bilaterally.  Leg skin normal exam; no erythema, swelling or tenderness Diabetic Foot Exam - Simple   Simple Foot Form Diabetic Foot exam was performed with the following findings: Yes 11/26/2022  2:01 PM  Visual Inspection No deformities, no ulcerations, no other skin breakdown bilaterally: Yes Sensation Testing Intact to touch and  monofilament testing bilaterally: Yes Pulse Check Comments             --------------------------------------------------------------------------------------------------------------------------------------------- Visit Problem List with A/P  No problem-specific Assessment & Plan notes found for this encounter.

## 2022-11-26 NOTE — Assessment & Plan Note (Signed)
Needs Shingrix, Tdap, MM, Ophth exam Discuss next visit.

## 2022-11-26 NOTE — Patient Outreach (Signed)
  Care Coordination   Initial Visit Note   11/26/2022 Name: [name] MRN: [id number] DOB: [birth year]  [name] is a 72 y.o. year old female who sees McDiarmid, Blane Ohara, MD for primary care. I spoke with  [name] by phone today.  What matters to the patients health and wellness today?  TOO TIRED TO TALK TODAY, REQUESTS TO RESCHEDULE.    Goals Addressed   None     SDOH assessments and interventions completed:  No     Care Coordination Interventions:  No, not indicated   Follow up plan: Follow up call scheduled for 12/01/22    Encounter Outcome:  Pt. Scheduled   Delores Edelstein C. Myrtie Neither, MSN, [facility] Gerontological Nurse Practitioner [facility] Care Management [phone]

## 2022-11-28 LAB — PROTEIN / CREATININE RATIO, URINE
Creatinine, Urine: 71.4 mg/dL
Protein, Ur: 48.2 mg/dL
Protein/Creat Ratio: 675 mg/g creat — ABNORMAL HIGH (ref 0–200)

## 2022-11-28 LAB — COMPREHENSIVE METABOLIC PANEL
ALT: 14 IU/L (ref 0–32)
AST: 16 IU/L (ref 0–40)
Albumin/Globulin Ratio: 1.4 (ref 1.2–2.2)
Albumin: 4.2 g/dL (ref 3.8–4.8)
Alkaline Phosphatase: 79 IU/L (ref 44–121)
BUN/Creatinine Ratio: 23 (ref 12–28)
BUN: 19 mg/dL (ref 8–27)
Bilirubin Total: <0.2 mg/dL (ref 0.0–1.2)
CO2: 20 mmol/L (ref 20–29)
Calcium: 9.6 mg/dL (ref 8.7–10.3)
Chloride: 99 mmol/L (ref 96–106)
Creatinine, Ser: 0.84 mg/dL (ref 0.57–1.00)
Globulin, Total: 2.9 g/dL (ref 1.5–4.5)
Glucose: 218 mg/dL — ABNORMAL HIGH (ref 70–99)
Potassium: 4 mmol/L (ref 3.5–5.2)
Sodium: 139 mmol/L (ref 134–144)
Total Protein: 7.1 g/dL (ref 6.0–8.5)
eGFR: 73 mL/min/{1.73_m2} (ref >59)

## 2022-11-28 LAB — BRAIN NATRIURETIC PEPTIDE: BNP: 66.2 pg/mL (ref 0.0–100.0)

## 2022-11-28 LAB — TSH: TSH: 0.95 u[IU]/mL (ref 0.450–4.500)

## 2022-11-30 NOTE — Telephone Encounter (Signed)
I spoke with [name] about her labs from last thrs. She reported that the swelling in her legs have decrease as has her home BP. No clear cause of her edema, I suspect it is venous insufficiency. I am a little surprised at the Lasix help with her edema. I did recommend compression stockings. She will think about it.   We spoke about her persistent proteinuria.  We talked about her normal serum creatinine.  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   She is on Losartan maximum dose.  She developed Yeast infections on SGLT2i, and is not open to trying one again.    A/  Suspect lower extremity edema is due to venous insufficiency.   Chronic Kidney Disease with normal serum creatinine levels but persistent proteinuria. On ARB, intol of SGLT2i.   P/ Since [name] feels the furosemide is help her leg swelling, will continue the 20 mg dose, prn leg swelling. Will resist raising dose if resistance to loop starts.  Recommend graduted compression stokcings 20-30 mmHg.  Leg elvation periodically throughout day.    Will monitor renal function periodical with serum creatinine and UPC, if progression, the referral to Nephrology

## 2022-12-01 ENCOUNTER — Encounter: Payer: Self-pay | Admitting: *Deleted

## 2022-12-01 ENCOUNTER — Other Ambulatory Visit: Payer: Self-pay | Admitting: Student

## 2022-12-01 ENCOUNTER — Telehealth: Payer: Self-pay | Admitting: Student

## 2022-12-01 ENCOUNTER — Telehealth: Payer: Self-pay | Admitting: *Deleted

## 2022-12-01 DIAGNOSIS — E1149 Type 2 diabetes mellitus with other diabetic neurological complication: Secondary | ICD-10-CM

## 2022-12-01 MED ORDER — BLOOD GLUCOSE TEST VI STRP: 1.0000 | ORAL_STRIP | Freq: Three times a day (TID) | 0 refills | Status: AC

## 2022-12-01 MED ORDER — LANCETS MISC. MISC: 1.0000 | Freq: Three times a day (TID) | 0 refills | Status: AC

## 2022-12-01 MED ORDER — BLOOD GLUCOSE MONITOR KIT: PACK | 0 refills | Status: AC

## 2022-12-01 MED ORDER — BLOOD GLUCOSE MONITORING SUPPL DEVI: 1.0000 | Freq: Three times a day (TID) | 0 refills | Status: AC

## 2022-12-01 MED ORDER — LANCET DEVICE MISC: 1.0000 | Freq: Three times a day (TID) | 0 refills | Status: AC

## 2022-12-01 NOTE — Patient Outreach (Signed)
  Care Coordination   Follow Up Visit Note   12/01/2022 Name: [name] MRN: [id number] DOB: 25-May-1951  [name] is a 72 y.o. year old female who sees McDiarmid, Blane Ohara, MD for primary care. I spoke with  [name] by phone today.  What matters to the patients health and wellness today?  Managing my pain, getting back to monitoring my glucose and start to get some more exercise in my life.    Goals Addressed             This Visit's Progress    Increase physical activity   Not on track    Interventions Today    Flowsheet Row Most Recent Value  Chronic Disease   Chronic disease during today's visit Diabetes  General Interventions   General Interventions Discussed/Reviewed General Interventions Discussed  Exercise Interventions   Exercise Discussed/Reviewed Exercise Discussed  Education Interventions   Education Provided Provided Education  Provided Verbal Education On Blood Sugar Monitoring, Exercise  Mental Health Interventions   Mental Health Discussed/Reviewed Mental Health Discussed  Nutrition Interventions   Nutrition Discussed/Reviewed Nutrition Reviewed  Pharmacy Interventions   Pharmacy Dicussed/Reviewed Medication Adherence  Safety Interventions   Safety Discussed/Reviewed Home Safety, Safety Reviewed  Advanced Directive Interventions   Advanced Directives Discussed/Reviewed Advanced Directives Discussed              SDOH assessments and interventions completed:  Yes  SDOH Interventions Today    Flowsheet Row Most Recent Value  SDOH Interventions   Food Insecurity Interventions Intervention Not Indicated  Housing Interventions Intervention Not Indicated  Utilities Interventions Intervention Not Indicated  Alcohol Usage Interventions Intervention Not Indicated (Score <7)  Financial Strain Interventions Intervention Not Indicated  Physical Activity Interventions [employer], Other (Comments)  Stress Interventions Intervention Not Indicated   Social Connections Interventions Intervention Not Indicated        Care Coordination Interventions:  Yes, provided   Follow up plan: Follow up call scheduled for 1 month.    Encounter Outcome:  Pt. Visit Completed   [name] C. Myrtie Neither, MSN, [facility] Gerontological Nurse Practitioner [facility] Care Management [phone]
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
## 2022-12-10 ENCOUNTER — Encounter: Payer: Self-pay | Admitting: Family Medicine

## 2022-12-10 ENCOUNTER — Ambulatory Visit (INDEPENDENT_AMBULATORY_CARE_PROVIDER_SITE_OTHER): Payer: Medicare Other | Admitting: Family Medicine

## 2022-12-10 VITALS — BP 167/72 | HR 94 | Ht 62.0 in | Wt 200.4 lb

## 2022-12-10 DIAGNOSIS — R600 Localized edema: Secondary | ICD-10-CM | POA: Diagnosis not present

## 2022-12-10 DIAGNOSIS — I152 Hypertension secondary to endocrine disorders: Secondary | ICD-10-CM | POA: Diagnosis not present

## 2022-12-10 DIAGNOSIS — E1159 Type 2 diabetes mellitus with other circulatory complications: Secondary | ICD-10-CM

## 2022-12-10 NOTE — Patient Instructions (Signed)
Your blood pressure is still high.   Continue your Carvedilol 6.25 mg twice a day, and the Lasix 20 mg every other day. Start: HCTZ (Hydrochloro thiazide) 25 mg a day.    We will check in with each other in a month.

## 2022-12-11 ENCOUNTER — Encounter: Payer: Self-pay | Admitting: Family Medicine

## 2022-12-11 NOTE — Assessment & Plan Note (Signed)
Established problem that has improved and has meet goal of reduced leg edema.  Continue current medication regiment.

## 2022-12-11 NOTE — Assessment & Plan Note (Signed)
Established problem Uncontrolled.  Patient is not at goal of <140/90. Patient started on carvedilol 6.25 mg twice a day on 2/8 Losartan 100 mg daily Lasix 20 mg every other day (for volume overload & leg edema)  Start HCTZ 25 mg daily Continue above antihypertensives

## 2022-12-11 NOTE — Progress Notes (Signed)
[name] is alone Sources of clinical information for visit is/are patient. Nursing assessment for this office visit was reviewed with the patient for accuracy and revision.     Previous Report(s) Reviewed: none     12/10/2022   11:07 AM  Depression screen PHQ 2/9  Decreased Interest 0  Down, Depressed, Hopeless 0  PHQ - 2 Score 0  Altered sleeping 0  Tired, decreased energy 0  Change in appetite 0  Feeling bad or failure about yourself  0  Trouble concentrating 0  Moving slowly or fidgety/restless 0  Suicidal thoughts 0  PHQ-9 Score 0   Flowsheet Row Office Visit from 12/10/2022 in [place] Office Visit from 11/26/2022 in [place] Office Visit from 10/01/2022 in [place]  Thoughts that you would be better off dead, or of hurting yourself in some way Not at all Not at all Not at all  PHQ-9 Total Score 0 2 2          12/01/2022    1:32 PM 11/26/2022   10:30 AM 10/01/2022   11:38 AM 09/17/2022    3:27 PM 09/15/2022   12:27 PM  [place] in the past year? 0 0 0 1 0  Number falls in past yr: 0 0 0 0   Injury with Fall? 0 0 0 1        12/10/2022   11:07 AM 12/01/2022    1:12 PM 11/26/2022   10:31 AM  PHQ9 SCORE ONLY  PHQ-9 Total Score 0 0 2    There are no preventive care reminders to display for this patient.  Health Maintenance Due  Topic Date Due   Zoster Vaccines- Shingrix (1 of 2) Never done   DTaP/Tdap/Td (2 - Tdap) 01/18/2012   Medicare Annual Wellness (AWV)  09/29/2017   MAMMOGRAM  10/15/2018   OPHTHALMOLOGY EXAM  11/09/2020   COVID-19 Vaccine (6 - 2023-24 season) 08/14/2022      History/P.E. limitations: none  There are no preventive care reminders to display for this patient.  Diabetes Health Maintenance Due  Topic Date Due   OPHTHALMOLOGY EXAM  11/09/2020   HEMOGLOBIN A1C  03/18/2023   FOOT EXAM  11/27/2023   LIPID PANEL  11/08/2024    Health Maintenance Due   Topic Date Due   Zoster Vaccines- Shingrix (1 of 2) Never done   DTaP/Tdap/Td (2 - Tdap) 01/18/2012   Medicare Annual Wellness (AWV)  09/29/2017   MAMMOGRAM  10/15/2018   OPHTHALMOLOGY EXAM  11/09/2020   COVID-19 Vaccine (6 - 2023-24 season) 08/14/2022     Chief Complaint  Patient presents with   Follow-up    BP     --------------------------------------------------------------------------------------------------------------------------------------------- Visit Problem List with A/P  Hypertension associated with diabetes ([place]) Established problem Uncontrolled.  Patient is not at goal of <140/90. Patient started on carvedilol 6.25 mg twice a day on 2/8 Losartan 100 mg daily Lasix 20 mg every other day (for volume overload & leg edema)  Start HCTZ 25 mg daily Continue above antihypertensives     Bilateral leg edema Established problem that has improved and has meet goal of reduced leg edema.  Continue current medication regiment.

## 2022-12-15 ENCOUNTER — Ambulatory Visit: Payer: Medicare Other | Admitting: Physical Medicine & Rehabilitation

## 2022-12-17 ENCOUNTER — Other Ambulatory Visit: Payer: Self-pay | Admitting: *Deleted

## 2022-12-17 DIAGNOSIS — Q762 Congenital spondylolisthesis: Secondary | ICD-10-CM

## 2022-12-17 DIAGNOSIS — F988 Other specified behavioral and emotional disorders with onset usually occurring in childhood and adolescence: Secondary | ICD-10-CM

## 2022-12-17 DIAGNOSIS — M47817 Spondylosis without myelopathy or radiculopathy, lumbosacral region: Secondary | ICD-10-CM

## 2022-12-17 DIAGNOSIS — M48062 Spinal stenosis, lumbar region with neurogenic claudication: Secondary | ICD-10-CM

## 2022-12-18 MED ORDER — HYDROCODONE-ACETAMINOPHEN 7.5-325 MG PO TABS: 1.0000 | ORAL_TABLET | Freq: Three times a day (TID) | ORAL | 0 refills | Status: DC | PRN

## 2022-12-18 MED ORDER — METHYLPHENIDATE HCL 10 MG PO TABS: 20.0000 mg | ORAL_TABLET | Freq: Three times a day (TID) | ORAL | 0 refills | Status: DC

## 2022-12-21 ENCOUNTER — Telehealth: Payer: Self-pay

## 2022-12-21 ENCOUNTER — Ambulatory Visit: Payer: Self-pay | Admitting: *Deleted

## 2022-12-21 DIAGNOSIS — M47817 Spondylosis without myelopathy or radiculopathy, lumbosacral region: Secondary | ICD-10-CM

## 2022-12-21 DIAGNOSIS — Q762 Congenital spondylolisthesis: Secondary | ICD-10-CM

## 2022-12-21 DIAGNOSIS — M48062 Spinal stenosis, lumbar region with neurogenic claudication: Secondary | ICD-10-CM

## 2022-12-21 DIAGNOSIS — F988 Other specified behavioral and emotional disorders with onset usually occurring in childhood and adolescence: Secondary | ICD-10-CM

## 2022-12-21 NOTE — Patient Outreach (Signed)
  Care Coordination   Follow Up Visit Note   12/21/2022 Name: [name] MRN: [id number] DOB: 1951-07-28  [name] is a 72 y.o. year old female who sees McDiarmid, Blane Ohara, MD for primary care. I spoke with  [name] by phone today.  What matters to the patients health and wellness today?  Keeping myself well and comfortable.    Goals Addressed             This Visit's Progress    Increase physical activity       Interventions Today    Flowsheet Row Most Recent Value  Chronic Disease   Chronic disease during today's visit Diabetes  General Interventions   General Interventions Discussed/Reviewed General Interventions Reviewed  Exercise Interventions   Exercise Discussed/Reviewed Exercise Discussed, Exercise Reviewed  [Has a treadmill now and says she will start using it within the next month.]  Education Interventions   Education Provided Provided Education  Provided Verbal Education On Nutrition, Blood Sugar Monitoring, Exercise, Medication, When to see the doctor  Nutrition Interventions   Nutrition Discussed/Reviewed Nutrition Reviewed  Pharmacy Interventions   Pharmacy Dicussed/Reviewed Medication Adherence             Increase physical activity          SDOH assessments and interventions completed:  Yes   previously addressed  Care Coordination Interventions:  Yes, provided   Follow up plan: Follow up call scheduled for 1 month    Encounter Outcome:  Pt. Visit Completed   [name] C. Myrtie Neither, MSN, [facility] Gerontological Nurse Practitioner [facility] Care Management [phone]

## 2022-12-21 NOTE — Telephone Encounter (Signed)
Patient calls nurse line in regards to Hydrocodone and Methylphenidate prescription.   These were sent in on 3/1 by PCP, however appears were set to print.   Will forward back to resend.

## 2022-12-22 MED ORDER — HYDROCODONE-ACETAMINOPHEN 7.5-325 MG PO TABS: 1.0000 | ORAL_TABLET | Freq: Three times a day (TID) | ORAL | 0 refills | Status: DC | PRN

## 2022-12-22 MED ORDER — METHYLPHENIDATE HCL 10 MG PO TABS: 20.0000 mg | ORAL_TABLET | Freq: Three times a day (TID) | ORAL | 0 refills | Status: DC

## 2023-01-12 ENCOUNTER — Encounter: Payer: Medicare Other | Admitting: Physical Medicine & Rehabilitation

## 2023-01-14 ENCOUNTER — Encounter: Payer: Self-pay | Admitting: Family Medicine

## 2023-01-14 ENCOUNTER — Ambulatory Visit (INDEPENDENT_AMBULATORY_CARE_PROVIDER_SITE_OTHER): Payer: Medicare Other | Admitting: Family Medicine

## 2023-01-14 VITALS — BP 130/56 | HR 86 | Ht 62.0 in | Wt 204.4 lb

## 2023-01-14 DIAGNOSIS — E1149 Type 2 diabetes mellitus with other diabetic neurological complication: Secondary | ICD-10-CM | POA: Diagnosis not present

## 2023-01-14 DIAGNOSIS — M47817 Spondylosis without myelopathy or radiculopathy, lumbosacral region: Secondary | ICD-10-CM

## 2023-01-14 DIAGNOSIS — M48062 Spinal stenosis, lumbar region with neurogenic claudication: Secondary | ICD-10-CM

## 2023-01-14 DIAGNOSIS — E1142 Type 2 diabetes mellitus with diabetic polyneuropathy: Secondary | ICD-10-CM | POA: Diagnosis not present

## 2023-01-14 DIAGNOSIS — I152 Hypertension secondary to endocrine disorders: Secondary | ICD-10-CM | POA: Diagnosis not present

## 2023-01-14 DIAGNOSIS — E1159 Type 2 diabetes mellitus with other circulatory complications: Secondary | ICD-10-CM | POA: Diagnosis not present

## 2023-01-14 DIAGNOSIS — N182 Chronic kidney disease, stage 2 (mild): Secondary | ICD-10-CM | POA: Diagnosis not present

## 2023-01-14 DIAGNOSIS — Z79899 Other long term (current) drug therapy: Secondary | ICD-10-CM | POA: Diagnosis not present

## 2023-01-14 DIAGNOSIS — E785 Hyperlipidemia, unspecified: Secondary | ICD-10-CM | POA: Diagnosis not present

## 2023-01-14 DIAGNOSIS — F988 Other specified behavioral and emotional disorders with onset usually occurring in childhood and adolescence: Secondary | ICD-10-CM

## 2023-01-14 DIAGNOSIS — Q762 Congenital spondylolisthesis: Secondary | ICD-10-CM

## 2023-01-14 DIAGNOSIS — E1169 Type 2 diabetes mellitus with other specified complication: Secondary | ICD-10-CM

## 2023-01-14 MED ORDER — AMLODIPINE BESY-BENAZEPRIL HCL 10-20 MG PO CAPS: 1.0000 | ORAL_CAPSULE | Freq: Every day | ORAL | 3 refills | Status: DC

## 2023-01-14 MED ORDER — HYDROCODONE-ACETAMINOPHEN 7.5-325 MG PO TABS: 1.0000 | ORAL_TABLET | Freq: Three times a day (TID) | ORAL | 0 refills | Status: DC | PRN

## 2023-01-14 MED ORDER — METHYLPHENIDATE HCL 10 MG PO TABS: 20.0000 mg | ORAL_TABLET | Freq: Three times a day (TID) | ORAL | 0 refills | Status: DC

## 2023-01-14 MED ORDER — HYDROCHLOROTHIAZIDE 25 MG PO TABS: 25.0000 mg | ORAL_TABLET | Freq: Every day | ORAL | 3 refills | Status: DC

## 2023-01-14 NOTE — Patient Instructions (Signed)
Refills for Lotrel 10-20 one daily Continue HCTZ, Carvedilol,  Stop the Losartan.  Please come by the [place] lab next week to check your kidneys and potassium on the Lotrel.

## 2023-01-14 NOTE — Progress Notes (Signed)
[name] is alone Sources of clinical information for visit is/are patient. Nursing assessment for this office visit was reviewed with the patient for accuracy and revision.     Previous Report(s) Reviewed: lab reports     01/14/2023   10:49 AM  Depression screen PHQ 2/9  Decreased Interest 0  Down, Depressed, Hopeless 0  PHQ - 2 Score 0  Altered sleeping 0  Tired, decreased energy 0  Change in appetite 0  Feeling bad or failure about yourself  0  Trouble concentrating 0  Moving slowly or fidgety/restless 0  Suicidal thoughts 0  PHQ-9 Score 0   Flowsheet Row Office Visit from 01/14/2023 in [place] Office Visit from 12/10/2022 in [place] Office Visit from 11/26/2022 in [place]  Thoughts that you would be better off dead, or of hurting yourself in some way Not at all Not at all Not at all  PHQ-9 Total Score 0 0 2          12/01/2022    1:32 PM 11/26/2022   10:30 AM 10/01/2022   11:38 AM 09/17/2022    3:27 PM 09/15/2022   12:27 PM  [employer] in the past year? 0 0 0 1 0  Number falls in past yr: 0 0 0 0   Injury with Fall? 0 0 0 1        01/14/2023   10:49 AM 12/10/2022   11:07 AM 12/01/2022    1:12 PM  PHQ9 SCORE ONLY  PHQ-9 Total Score 0 0 0    There are no preventive care reminders to display for this patient.  Health Maintenance Due  Topic Date Due   Zoster Vaccines- Shingrix (1 of 2) Never done   DTaP/Tdap/Td (2 - Tdap) 01/18/2012   Medicare Annual Wellness (AWV)  09/29/2017   MAMMOGRAM  10/15/2018      History/P.E. limitations: none  There are no preventive care reminders to display for this patient.  Diabetes Health Maintenance Due  Topic Date Due   HEMOGLOBIN A1C  03/18/2023   OPHTHALMOLOGY EXAM  10/23/2023   FOOT EXAM  11/27/2023   LIPID PANEL  11/08/2024    Health Maintenance Due  Topic Date Due   Zoster Vaccines- Shingrix (1 of 2) Never done   DTaP/Tdap/Td  (2 - Tdap) 01/18/2012   Medicare Annual Wellness (AWV)  09/29/2017   MAMMOGRAM  10/15/2018     Chief Complaint  Patient presents with   Hypertension     --------------------------------------------------------------------------------------------------------------------------------------------- Visit Problem List with A/P  Hypertension associated with diabetes ([place]) Established problem that has improved and has meet goal of <140/90.  [name] has been taking your medications differently than prescribed.  She felt the Losartan was raising her SBP into the 190s.  She stopped aweek or two ago, and restarted Lotrel 10-20 one tab daily (she had left over from old prescirption.   While her leg edema did return, it was not to the extent that she had previously experience with Amlodipine.  Her home blood pressure had SBP 130. Plan Stop losartan Restart Amlodipine-benazepril 10-20 tab, one tab daily Continue HCTZ 25 mg daily Continue Lasix 20 mg prn every other day for leg edema.   Patient to come by [facility] lab in a week for Basic Metabolic Panel.   RTC 3-4 months   Attention deficit disorder Established problem Well Controlled and is at goal of sustained attention allowing her  to complete tasks necessary for her self and her family. PDMP review without red flags. No aberrant addictive behaviors No signs of complications, medication side effects, or red flags. Continue current medications

## 2023-01-14 NOTE — Assessment & Plan Note (Signed)
Established problem that has improved and has meet goal of <140/90.  [name] has been taking your medications differently than prescribed.  She felt the Losartan was raising her SBP into the 190s.  She stopped aweek or two ago, and restarted Lotrel 10-20 one tab daily (she had left over from old prescirption.   While her leg edema did return, it was not to the extent that she had previously experience with Amlodipine.  Her home blood pressure had SBP 130. Plan Stop losartan Restart Amlodipine-benazepril 10-20 tab, one tab daily Continue HCTZ 25 mg daily Continue Lasix 20 mg prn every other day for leg edema.   Patient to come by [facility] lab in a week for Basic Metabolic Panel.   RTC 3-4 months

## 2023-01-14 NOTE — Assessment & Plan Note (Signed)
Established problem Well Controlled and is at goal of sustained attention allowing her to complete tasks necessary for her self and her family. PDMP review without red flags. No aberrant addictive behaviors No signs of complications, medication side effects, or red flags. Continue current medications

## 2023-01-19 ENCOUNTER — Ambulatory Visit: Payer: Self-pay | Admitting: *Deleted

## 2023-01-19 NOTE — Patient Outreach (Signed)
  Care Coordination   Follow Up Visit Note   01/19/2023 Name: [name] MRN: [id number] DOB: Nov 30, 1950  [name] is a 72 y.o. year old female who sees [name], Barbara Ohara, MD for primary care. I spoke with  [name] by phone today.  What matters to the patients health and wellness today?  Try to work on my goals. (Pt needs a lot of encouragement)    Goals Addressed             This Visit's Progress    Blood Pressure < 150/90       Interventions Today    Flowsheet Row Most Recent Value  Chronic Disease   Chronic disease during today's visit Hypertension (HTN)  General Interventions   General Interventions Discussed/Reviewed General Interventions Reviewed  [New medication added: Carvedilol 6.25 mg bid. BP improved. Last check 130/64 per pt report. Reinforced Medication compliance. Try to check BP 2 hours after am meds. No added salt. Exercise (she is doing treadmill several times a week).]  Exercise Interventions   Exercise Discussed/Reviewed Exercise Reviewed  Nutrition Interventions   Nutrition Discussed/Reviewed Decreasing salt  Pharmacy Interventions   Pharmacy Dicussed/Reviewed Medication Adherence           HEMOGLOBIN A1C < 7.5       Interventions Today    Flowsheet Row Most Recent Value  Chronic Disease   Chronic disease during today's visit Diabetes  General Interventions   General Interventions Discussed/Reviewed General Interventions Reviewed  [Pt still does not have a CMG, says she cannot afford it. She will not do fingersticks. She is taking her meds and her last HgbA1C was 6.8. Will try to find a way to get her the CMG.]  Exercise Interventions   Exercise Discussed/Reviewed Exercise Reviewed  Nutrition Interventions   Nutrition Discussed/Reviewed Decreasing salt  Pharmacy Interventions   Pharmacy Dicussed/Reviewed Medication Adherence           COMPLETED: Increase physical activity       This goal is contained in the standard education and  encouragement for both DM and HTN.          COMPLETED: Increase physical activity       LDL CALC < 100       No recent lipid panel in EPIC last was in 2021. Needs fasting lab for this. Will request Dr. McDiarmid to order.        SDOH assessments and interventions completed:  Yes    Previously addressed.  Care Coordination Interventions:  Yes, provided   Follow up plan:  [name] will follow up in May.    Encounter Outcome:  Pt. Visit Completed   [name] C. Myrtie Neither, MSN, [facility] Gerontological Nurse Practitioner [facility] Care Management [phone]

## 2023-01-21 ENCOUNTER — Other Ambulatory Visit: Payer: Medicare Other

## 2023-01-21 DIAGNOSIS — E1159 Type 2 diabetes mellitus with other circulatory complications: Secondary | ICD-10-CM | POA: Diagnosis not present

## 2023-01-21 DIAGNOSIS — Z79899 Other long term (current) drug therapy: Secondary | ICD-10-CM

## 2023-01-21 DIAGNOSIS — I152 Hypertension secondary to endocrine disorders: Secondary | ICD-10-CM | POA: Diagnosis not present

## 2023-01-22 LAB — BASIC METABOLIC PANEL
BUN/Creatinine Ratio: 20 (ref 12–28)
BUN: 22 mg/dL (ref 8–27)
CO2: 21 mmol/L (ref 20–29)
Calcium: 9.6 mg/dL (ref 8.7–10.3)
Chloride: 97 mmol/L (ref 96–106)
Creatinine, Ser: 1.09 mg/dL — ABNORMAL HIGH (ref 0.57–1.00)
Glucose: 153 mg/dL — ABNORMAL HIGH (ref 70–99)
Potassium: 4.3 mmol/L (ref 3.5–5.2)
Sodium: 137 mmol/L (ref 134–144)
eGFR: 54 mL/min/{1.73_m2} — ABNORMAL LOW (ref >59)

## 2023-02-01 ENCOUNTER — Other Ambulatory Visit (HOSPITAL_COMMUNITY): Payer: Self-pay

## 2023-02-04 ENCOUNTER — Other Ambulatory Visit (HOSPITAL_COMMUNITY): Payer: Self-pay

## 2023-02-15 ENCOUNTER — Telehealth: Payer: Self-pay | Admitting: Family Medicine

## 2023-02-15 NOTE — Telephone Encounter (Signed)
Contacted Sunday Corn to schedule their annual wellness visit. Appointment made for 02/18/2023.  Thank you,  [facility] Support [facility] Direct dial  [phone]

## 2023-02-18 ENCOUNTER — Ambulatory Visit (INDEPENDENT_AMBULATORY_CARE_PROVIDER_SITE_OTHER): Payer: Medicare Other

## 2023-02-18 DIAGNOSIS — Z0000 Encounter for general adult medical examination without abnormal findings: Secondary | ICD-10-CM | POA: Diagnosis not present

## 2023-02-18 NOTE — Progress Notes (Signed)
I connected with  [name] on 02/18/23 by a audio enabled telemedicine application and verified that I am speaking with the correct person using two identifiers.  Patient Location: Home  Provider Location: Home Office  I discussed the limitations of evaluation and management by telemedicine. The patient expressed understanding and agreed to proceed.  Subjective:   [name] is a 72 y.o. female who presents for Medicare Annual (Subsequent) preventive examination.  Review of Systems    Per HPI unless specifically indicated below.        Objective:       01/14/2023   10:51 AM 12/10/2022   11:27 AM 12/10/2022   11:07 AM  Vitals with BMI  Height 5\' 2"   5\' 2"   Weight 204 lbs 6 oz  200 lbs 6 oz  BMI 37.37  36.64  Systolic 130 167 161  Diastolic 56 72 79  Pulse 86  94    Today's Vitals   02/18/23 1503  PainSc: 5    There is no height or weight on file to calculate BMI.     01/14/2023   10:49 AM 12/10/2022   11:07 AM 11/26/2022   10:31 AM 10/01/2022   11:39 AM 09/17/2022    3:26 PM 05/14/2022    2:42 PM 11/13/2021    3:10 PM  Advanced Directives  Does Patient Have a Medical Advance Directive? No No No No No No No  Would patient like information on creating a medical advance directive? No - Patient declined No - Patient declined No - Patient declined No - Patient declined No - Patient declined No - Patient declined No - Patient declined    Current Medications (verified) Outpatient Encounter Medications as of 02/18/2023  Medication Sig   amLODipine-benazepril (LOTREL) 10-20 MG capsule Take 1 capsule by mouth daily.   atorvastatin (LIPITOR) 40 MG tablet Take 1 tablet (40 mg total) by mouth daily.   blood glucose meter kit and supplies KIT Dispense based on patient and insurance preference. Use up to four times daily as directed.   Blood Glucose Monitoring Suppl DEVI 1 each by Does not apply route in the morning, at noon, and at bedtime. May substitute to any  manufacturer covered by patient's insurance.   carvedilol (COREG) 6.25 MG tablet Take 1 tablet (6.25 mg total) by mouth 2 (two) times daily with a meal.   furosemide (LASIX) 20 MG tablet Take 1 tablet (20 mg total) by mouth every other day.   gabapentin (NEURONTIN) 100 MG capsule TAKE 1 CAPSULE(100 MG) BY MOUTH THREE TIMES DAILY AS NEEDED   hydrochlorothiazide (HYDRODIURIL) 25 MG tablet Take 1 tablet (25 mg total) by mouth daily.   HYDROcodone-acetaminophen (NORCO) 7.5-325 MG tablet Take 1 tablet by mouth every 8 (eight) hours as needed for moderate pain.   metFORMIN (GLUCOPHAGE-XR) 500 MG 24 hr tablet Take 1 tablet (500 mg total) by mouth 2 (two) times daily with a meal.   methylphenidate (RITALIN) 10 MG tablet Take 2 tablets (20 mg total) by mouth 3 (three) times daily with meals.   naproxen (NAPROSYN) 500 MG tablet TAKE 1 TABLET(500 MG) BY MOUTH TWICE DAILY WITH A MEAL   OVER THE COUNTER MEDICATION Store brand Ducolax stool softener. One capsule daily.   vitamin B-12 (CYANOCOBALAMIN) 1000 MCG tablet Take 1 tablet (1,000 mcg total) by mouth daily.   No facility-administered encounter medications on file as of 02/18/2023.    Allergies (verified) Carvedilol [coreg], Januvia [sitagliptin], Jardiance [empagliflozin], Amlodipine, Celecoxib,  and Diclofenac-misoprostol   History: Past Medical History:  Diagnosis Date   Acute MEE (middle ear effusion) 06/07/2014   Acute recurrent sinusitis 12/21/2014   Acute sinusitis 12/21/2014   Allergy    Anemia    hx   Asthma, intermittent 11/02/2018   ATTENTION DEFICIT, W/O HYPERACTIVITY 12/16/2006   Bilateral carpal tunnel syndrome 11/20/2016   Cataract    Cervical spondylosis with myelopathy, History of 04/28/2012   COLON POLYP 12/16/2006   (02/03/12) Surgical [Pathology], sigmoid colon, polyp HYPERPLASTIC POLYP(S) AND POLYPOID FRAGMETNS OF BENIGN COLONIC MUCOSA WITH INTRAMUCOSAL LYMPHOID AGGREGATES. NO ADENOMATOUS CHANGE OR MALIGNANCY IDENTIFIED.     DIABETES MELLITUS II, UNCOMPLICATED 12/16/2006   DIFFUSE IDIOPATHIC SKELETAL HYPEROSTOSIS 03/29/2008   Fall 10/18/2020   GERD (gastroesophageal reflux disease)    H/O abnormal mammogram 07/19/2009   S/P Breast Biopsy [facility], [place], 07/2009): Benign findings.     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   HEMORRHOIDS, NOS 12/16/2006   Qualifier: History of  By: McDiarmid MD, Tawanna Cooler     History of blood transfusion 10/2004   with knee replacement   History of peptic ulcer 12/16/2006   UGI series PUD - 10/19/1998     History of right greater trochanteric bursitis 11/07/2009   HYPERCHOLESTEROLEMIA 02/28/2007   HYPERTENSION, BENIGN SYSTEMIC 12/16/2006   Hyponatremia 08/30/2015   INCONTINENCE, URGE 12/16/2006   Qualifier: History of  By: McDiarmid MD, Todd     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/20/2016   OSTEOARTHRITIS, MULTI SITES 12/16/2006   Perennial allergic rhinitis with seasonal variation 12/16/2006        PONV (postoperative nausea and vomiting)    last surgery only   Recurrent maxillary sinusitis    SACROILIITIS, HISTORY OF 01/08/2009   Qualifier: History of  By: McDiarmid MD, Todd     Seasonal allergies    Seborrheic keratosis 01/15/2016   Ulcer    Uterine fibroid 02/02/2011   TVUS: 4cm fibroid w/ submucosal component, bil.hydrosalpinges, unable measurable endometrium - 08/18/2004    Past Surgical History:  Procedure Laterality Date   ANTERIOR CERVICAL DECOMP/DISCECTOMY FUSION  04/28/2012   Procedure: ANTERIOR CERVICAL DECOMPRESSION/DISCECTOMY FUSION 3 LEVELS;  Surgeon: Cristi Loron, MD;  Location: [facility];  Service: Neurosurgery;  Laterality: N/A;  Cervical three-four,Cervical four-five,Cervical five-six anterior cervical decompression with fusion interbody prothesis plating and bonegraft   BREAST BIOPSY  07/2009   S/P Breast Biopsy [facility], [place], 07/2009): Benign findings.  (10/27/2010)   CARPAL TUNNEL RELEASE Left 12/10/2016    Procedure: LEFT CARPAL TUNNEL RELEASE;  Surgeon: Cindee Salt, MD;  Location: [facility];  Service: Orthopedics;  Laterality: Left;   COLONOSCOPY W/ POLYPECTOMY  12/2000   colonoscopy (Dr Loreta Ave) int. hemorrhoids &  nonneoplatic colon polyps - 01/10/2001,    COLONOSCOPY W/ POLYPECTOMY  01/2012   Dr Rhea Belton (GI).sigmoid colon, hyperlastic polyp   ENDOMETRIAL BIOPSY  08/2004   Endometrial Biopsy - 09/01/2004,   NM MYOVIEW LTD  04/2004   Cardiolite:EF63%, no ischemia - 05/01/2004,    REPLACEMENT TOTAL KNEE BILATERAL     Family History  Problem Relation Age of Onset   Hypertension Mother    Arthritis Mother    Cancer Sister 84       Breast   Cancer Sister 58       Breast   Arthritis Father    Diabetes Father    Heart attack Sister    Drug abuse Sister    Colon cancer Neg Hx  Esophageal cancer Neg Hx    Rectal cancer Neg Hx    Stomach cancer Neg Hx    Social History   Socioeconomic History   Marital status: Married    Spouse name: Not on file   Number of children: 1   Years of education: Not on file   Highest education level: Not on file  Occupational History   Occupation: Homemaker    Employer: RETIRED  Tobacco Use   Smoking status: Former    Packs/day: 1.00    Years: 4.00    Additional pack years: 0.00    Total pack years: 4.00    Types: Cigarettes    Quit date: 10/20/1979    Years since quitting: 43.3   Smokeless tobacco: Never  Vaping Use   Vaping Use: Never used  Substance and Sexual Activity   Alcohol use: No   Drug use: No   Sexual activity: Yes  Other Topics Concern   Not on file  Social History Narrative   Divorced.  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   Mother of 72 adopted children, most with special needs   Regular exercise-no         Social Determinants of Health   Financial Resource Strain: Low Risk  (02/18/2023)    Overall Financial Resource Strain (CARDIA)    Difficulty of Paying Living Expenses: Not hard at all  Food Insecurity: No Food Insecurity (02/18/2023)   Hunger Vital Sign    Worried About Running Out of Food in the Last Year: Never true    Ran Out of Food in the Last Year: Never true  Transportation Needs: No Transportation Needs (02/18/2023)   PRAPARE - Administrator, Civil Service (Medical): No    Lack of Transportation (Non-Medical): No  Physical Activity: Insufficiently Active (02/18/2023)   Exercise Vital Sign    Days of Exercise per Week: 2 days    Minutes of Exercise per Session: 20 min  Stress: No Stress Concern Present (02/18/2023)   [employer] of Occupational Health - Occupational Stress Questionnaire    Feeling of Stress : Not at all  Social Connections: Socially Integrated (02/18/2023)   Social Connection and Isolation Panel [NHANES]    Frequency of Communication with Friends and Family: More than three times a week    Frequency of Social Gatherings with Friends and Family: Twice a week    Attends Religious Services: 1 to 4 times per year    Active Member of [employer] or Organizations: Yes    Attends Banker Meetings: Never    Marital Status: Married  Recent Concern: Social Connections - Moderately Isolated (12/01/2022)   Social Connection and Isolation Panel [NHANES]    Frequency of Communication with Friends and Family: More than three times a week    Frequency of Social Gatherings with Friends and Family: Twice a week    Attends Religious Services: Never    Database administrator or Organizations: Yes    Attends Banker Meetings: Never    Marital Status: Divorced    Tobacco Counseling Counseling given: No   Clinical Intake:  Pre-visit preparation completed: No  Pain : 0-10 Pain Score: 5  Pain Type: Chronic pain Pain Location: Back Pain Orientation: Lower Pain Descriptors / Indicators: Aching Pain Onset: More than a month  ago Pain Frequency: Constant     Nutritional Status: BMI > 30  Obese Nutritional Risks: None Diabetes: Yes CBG done?: No Did pt. bring in CBG monitor from home?: No  How often do you need to have someone help you when you read instructions, pamphlets, or other written materials from your doctor or pharmacy?: 1 - Never  Diabetic?Nutrition Risk Assessment:  Has the patient had any N/V/D within the last 2 months?  No  Does the patient have any non-healing wounds?  No  Has the patient had any unintentional weight loss or weight gain?  No   Diabetes:  Is the patient diabetic?  Yes  If diabetic, was a CBG obtained today?  No  Did the patient bring in their glucometer from home?  No  How often do you monitor your CBG's? Occasionally .   Financial Strains and Diabetes Management:  Are you having any financial strains with the device, your supplies or your medication? No .  Does the patient want to be seen by Chronic Care Management for management of their diabetes?  No  Would the patient like to be referred to a Nutritionist or for Diabetic Management?  No   Diabetic Exams:  Diabetic Eye Exam: Completed 10/22/2022 Diabetic Foot Exam: Completed 11/26/2022   Interpreter Needed?: No  Information entered by :: Laurel Dimmer, CMA   Activities of Daily Living    02/18/2023    3:01 PM  In your present state of health, do you have any difficulty performing the following activities:  Hearing? 0  Vision? 1  Comment Wear glasses, 10/2022, [facility] Dr. Druscilla Brownie  Difficulty concentrating or making decisions? 0  Walking or climbing stairs? 1  Dressing or bathing? 0  Doing errands, shopping? 0    Patient Care Team: McDiarmid, Leighton Roach, MD as PCP - General Janet Berlin, MD as Consulting Physician (Ophthalmology) Tressie Stalker, MD as Consulting Physician (Neurosurgery) Galen Manila, MD as Consulting Physician (Ophthalmology)  Indicate any recent Medical  Services you may have received from other than [facility] providers in the past year (date may be approximate).     Assessment:   This is a routine wellness examination for [place].   Hearing/Vision screen Denies any hearing issues. Denies any change to her vision. Wear glasses. Annual Eye Exam.   Dietary issues and exercise activities discussed:     Goals Addressed   None    Depression Screen    02/18/2023    3:01 PM 01/14/2023   10:49 AM 12/10/2022   11:07 AM 12/01/2022    1:12 PM 11/26/2022   10:31 AM 11/12/2022    4:24 PM 10/01/2022   11:38 AM  PHQ 2/9 Scores  PHQ - 2 Score 0 0 0 0 0 0 0  PHQ- 9 Score  0 0  2  2    Fall Risk    02/18/2023    3:01 PM 12/01/2022    1:32 PM 11/26/2022   10:30 AM 10/01/2022   11:38 AM 09/17/2022    3:27 PM  Fall Risk   Falls in the past year? 0 0 0 0 1  Number falls in past yr: 0 0 0 0 0  Injury with Fall? 0 0 0 0 1  Risk for fall due to : No Fall Risks      Follow up Falls evaluation completed        FALL RISK PREVENTION PERTAINING TO THE HOME:  Any stairs in or around the home? Yes  If so, are there any without handrails? No  Home free of loose throw rugs in walkways, pet beds, electrical cords, etc? Yes  Adequate lighting in your home to reduce risk of falls? Yes   ASSISTIVE DEVICES UTILIZED TO PREVENT FALLS:  Life alert? No  Use of a cane, walker or w/c? Yes  Grab bars in the bathroom? Yes  Shower chair or bench in shower? Yes  Elevated toilet seat or a handicapped toilet? Yes   TIMED UP AND GO:  Was the test performed? Unable to perform, virtual appointment   Cognitive Function:        02/18/2023    3:06 PM  6CIT Screen  What Year? 0 points  What month? 0 points  What time? 0 points  Count back from 20 0 points  Months in reverse 0 points  Repeat phrase 0 points  Total Score 0 points    Immunizations Immunization History  Administered Date(s) Administered   Fluad Quad(high Dose 65+) 07/05/2019, 07/19/2020, 08/27/2022    Influenza Split 08/08/2012   Influenza Whole 07/19/2006, 09/14/2007, 07/27/2008, 08/12/2010   Influenza,inj,Quad PF,6+ Mos 07/31/2013, 08/10/2014, 08/13/2015, 07/30/2016, 07/27/2017, 06/23/2018, 08/04/2021   Moderna SARS-COV2 Booster Vaccination 07/30/2021   Moderna Sars-Covid-2 Vaccination 12/01/2019, 12/29/2019, 08/02/2020, 03/12/2021   PFIZER Comirnaty(Gray Top)Covid-19 Tri-Sucrose Vaccine 06/19/2022   Pneumococcal Conjugate-13 01/24/2015   Pneumococcal Polysaccharide-23 10/19/1994, 04/29/2012, 09/16/2017   Td 01/17/2002   Zoster, Live 03/03/2012    TDAP status: Due, Education has been provided regarding the importance of this vaccine. Advised may receive this vaccine at local pharmacy or Health Dept. Aware to provide a copy of the vaccination record if obtained from local pharmacy or Health Dept. Verbalized acceptance and understanding.  Flu Vaccine status: Up to date  Pneumococcal vaccine status: Up to date  Covid-19 vaccine status: Information provided on how to obtain vaccines.   Qualifies for Shingles Vaccine? Yes   Zostavax completed No   Shingrix Completed?: No.    Education has been provided regarding the importance of this vaccine. Patient has been advised to call insurance company to determine out of pocket expense if they have not yet received this vaccine. Advised may also receive vaccine at local pharmacy or Health Dept. Verbalized acceptance and understanding.  Screening Tests Health Maintenance  Topic Date Due   Zoster Vaccines- Shingrix (1 of 2) Never done   DTaP/Tdap/Td (2 - Tdap) 01/18/2012   MAMMOGRAM  10/15/2018   COVID-19 Vaccine (6 - 2023-24 season) 08/14/2022   HEMOGLOBIN A1C  03/18/2023   INFLUENZA VACCINE  05/20/2023   OPHTHALMOLOGY EXAM  10/23/2023   Diabetic kidney evaluation - Urine ACR  11/27/2023   FOOT EXAM  11/27/2023   Diabetic kidney evaluation - eGFR measurement  01/21/2024   Medicare Annual Wellness (AWV)  02/18/2024   LIPID PANEL   11/08/2024   COLONOSCOPY (Pts 45-66yrs Insurance coverage will need to be confirmed)  05/26/2027   Pneumonia Vaccine 32+ Years old  Completed   DEXA SCAN  Completed   Hepatitis C Screening  Completed   HPV VACCINES  Aged Out    Health Maintenance  Health Maintenance Due  Topic Date Due   Zoster Vaccines- Shingrix (1 of 2) Never done   DTaP/Tdap/Td (2 - Tdap) 01/18/2012   MAMMOGRAM  10/15/2018   COVID-19 Vaccine (6 - 2023-24 season) 08/14/2022    Colorectal cancer screening: Type of screening: Colonoscopy. Completed 05/25/2022. Repeat every 5 years  Mammogram: the pt will get [facility] scheduled through the [facility]  DEXA Scan: 01/12/2017  Lung Cancer Screening: (Low Dose CT Chest recommended if Age 70-80 years, 30 pack-year currently smoking OR have quit w/in 15years.) does not qualify.   Lung Cancer Screening Referral: not applicable   Additional Screening:  Hepatitis C Screening: does qualify; Completed 01/24/2015 Vision Screening: Recommended annual ophthalmology exams for early detection of glaucoma and other disorders of the eye. Is the patient up to date with their annual eye exam?  Yes  Who is the provider or what is the name of the office in which the patient attends annual eye exams? [facility], Dr. Alinda Money If pt is not established with a provider, would they like to be referred to a provider to establish care? No .   Dental Screening: Recommended annual dental exams for proper oral hygiene  Community Resource Referral / Chronic Care Management: CRR required this visit?  No   CCM required this visit?  No      Plan:     I have personally reviewed and noted the following in the patient's chart:   Medical and social history Use of alcohol, tobacco or illicit drugs  Current medications and supplements including opioid prescriptions.  Functional ability and status Nutritional status Physical activity Advanced directives List of other  physicians Hospitalizations, surgeries, and ER visits in previous 12 months Vitals Screenings to include cognitive, depression, and falls Referrals and appointments  In addition, I have reviewed and discussed with patient certain preventive protocols, quality metrics, and best practice recommendations. A written personalized care plan for preventive services as well as general preventive health recommendations were provided to patient.    Ms. Stilley , Thank you for taking time to come for your Medicare Wellness Visit. I appreciate your ongoing commitment to your health goals. Please review the following plan we discussed and let me know if I can assist you in the future.   These are the goals we discussed:  Goals      Blood Pressure < 150/90     Interventions Today    Flowsheet Row Most Recent Value  Chronic Disease   Chronic disease during today's visit Hypertension (HTN)  General Interventions   General Interventions Discussed/Reviewed General Interventions Reviewed  [New medication added: Carvedilol 6.25 mg bid. BP improved. Last check 130/64 per pt report. Reinforced Medication compliance. Try to check BP 2 hours after am meds. No added salt. Exercise (she is doing treadmill several times a week).]  Exercise Interventions   Exercise Discussed/Reviewed Exercise Reviewed  Nutrition Interventions   Nutrition Discussed/Reviewed Decreasing salt  Pharmacy Interventions   Pharmacy Dicussed/Reviewed Medication Adherence           HEMOGLOBIN A1C < 7.5     Interventions Today    Flowsheet Row Most Recent Value  Chronic Disease   Chronic disease during today's visit Diabetes  General Interventions   General Interventions Discussed/Reviewed General Interventions Reviewed  [Pt still does not have a CMG, says she cannot afford it. She will not do fingersticks. She is taking her meds and her last HgbA1C was 6.8. Will try to find a way to get her the CMG.]  Exercise Interventions    Exercise Discussed/Reviewed Exercise Reviewed  Nutrition Interventions   Nutrition Discussed/Reviewed Decreasing salt  Pharmacy Interventions   Pharmacy Dicussed/Reviewed Medication Adherence           LDL CALC < 100     No recent lipid panel in EPIC last was in 2021. Needs fasting lab for this. Will request Dr.  McDiarmid to order.        This is a list of the screening recommended for you and due dates:  Health Maintenance  Topic Date Due   Zoster (Shingles) Vaccine (1 of 2) Never done   DTaP/Tdap/Td vaccine (2 - Tdap) 01/18/2012   Mammogram  10/15/2018   COVID-19 Vaccine (6 - 2023-24 season) 08/14/2022   Hemoglobin A1C  03/18/2023   Flu Shot  05/20/2023   Eye exam for diabetics  10/23/2023   Yearly kidney health urinalysis for diabetes  11/27/2023   Complete foot exam   11/27/2023   Yearly kidney function blood test for diabetes  01/21/2024   Medicare Annual Wellness Visit  02/18/2024   Lipid (cholesterol) test  11/08/2024   Colon Cancer Screening  05/26/2027   Pneumonia Vaccine  Completed   DEXA scan (bone density measurement)  Completed   Hepatitis C Screening: USPSTF Recommendation to screen - Ages 69-79 yo.  Completed   HPV Vaccine  Aged [address] [place]   02/18/2023   Nurse Notes: Approximately 30 minute Non-Face -To-Face Medicare Wellness Visit

## 2023-02-18 NOTE — Patient Instructions (Signed)

## 2023-02-19 ENCOUNTER — Ambulatory Visit: Payer: Medicare Other | Admitting: Physical Medicine & Rehabilitation

## 2023-02-23 ENCOUNTER — Encounter: Payer: Self-pay | Admitting: Physical Medicine & Rehabilitation

## 2023-02-23 ENCOUNTER — Encounter: Payer: Medicare Other | Attending: Physical Medicine & Rehabilitation | Admitting: Physical Medicine & Rehabilitation

## 2023-02-23 VITALS — BP 146/80 | HR 94 | Ht 62.0 in | Wt 197.0 lb

## 2023-02-23 DIAGNOSIS — M47817 Spondylosis without myelopathy or radiculopathy, lumbosacral region: Secondary | ICD-10-CM

## 2023-02-23 NOTE — Progress Notes (Signed)
Subjective:    Patient ID: [name], female    DOB: 01-18-1951, 72 y.o.   MRN: [id number] 07/30/22 Left L5 dorsal ramus., left L4 and left L3 medial branch radio frequency neurotomy under fluoroscopic guidance HPI CC:  pain in center of back Not a new issue , has been ongoing for approximately 20 years. The patient has undergone bilateral medial branch blocks L3 and L4 as well as L5 dorsal ramus on 04/16/2022 as well as 06/05/2022 with significant relief The patient underwent left-sided RF same nerves listed above and on in the follow-up was doing quite well therefore the right side was never performed.  MRI LUMBAR SPINE WITHOUT CONTRAST   TECHNIQUE: Multiplanar, multisequence MR imaging of the lumbar spine was performed. No intravenous contrast was administered.   COMPARISON:  Prior radiograph from 05/21/2010.   FINDINGS: Segmentation: Standard. Lowest well-formed disc space labeled the L5-S1 level.   Alignment: Levoscoliosis with straightening of the normal lumbar lordosis. Trace 2 mm anterolisthesis of L2 on L3, with 3 mm anterolisthesis of L3 on L4, and 4 mm anterolisthesis of L4 on L5. 4 mm retrolisthesis of L5 on S1. Findings chronic and facet mediated.   Vertebrae: Mild degenerative height loss present at the L4 vertebral body. Vertebral body height otherwise maintained. No acute or chronic fracture. Bone marrow signal intensity within normal limits. No discrete or worrisome osseous lesions. Prominent reactive endplate change present about the L3-4 through L5-S1 interspaces. No other abnormal marrow edema.   Conus medullaris and cauda equina: Conus extends to the superior aspect of L3. Conus medullaris within normal limits. 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   Paraspinal and other soft tissues: Paraspinous soft tissues within normal limits. Few simple cysts noted about the kidneys bilaterally. Visualized visceral structures  otherwise unremarkable.   Disc levels:   T12-L1: Minimal disc bulge. Mild right greater than left facet hypertrophy. No canal or foraminal stenosis.   L1-2: Mild disc bulge. Moderate facet and ligament flavum hypertrophy. Resultant mild spinal stenosis. Foramina remain patent.   L2-3: Trace anterolisthesis. Diffuse disc bulge with disc desiccation and intervertebral disc space narrowing. Superimposed small central to right paracentral disc protrusion indents the ventral thecal sac (series 13, image 15). Moderate bilateral facet hypertrophy. Resultant mild canal with moderate right lateral recess stenosis. Foramina remain patent.   L3-4: 3 mm anterolisthesis. Diffuse disc bulge with disc desiccation and intervertebral disc space narrowing. Prominent reactive endplate spurring, most pronounced on the right. Severe bilateral facet arthrosis. Resultant severe spinal stenosis, with the thecal sac measuring 4 mm in AP diameter at its most narrow point. Moderate right with mild left L3 foraminal narrowing.   L4-5: 4 mm anterolisthesis. Diffuse disc bulge with disc desiccation and intervertebral disc space narrowing. Prominent reactive endplate change with osteophytic endplate spurring, most pronounced on the left. Severe bilateral facet arthrosis with ligament flavum hypertrophy, also slightly worse on the left. Resultant severe spinal stenosis. Moderate to severe left worse than right L4 foraminal narrowing.   L5-S1: 4 mm retrolisthesis. Diffuse disc bulge with disc desiccation and intervertebral disc space narrowing. Associated discogenic reactive endplate change with marginal endplate osteophytic spurring. Moderate left worse than right facet hypertrophy. Trace joint effusion on the right. No significant spinal stenosis. Severe left greater than right L5 foraminal narrowing.   IMPRESSION: 1. Multifactorial degenerative changes at L3-4 and L4-5 with resultant severe spinal  stenosis, with moderate to severe right L3 and bilateral L4  foraminal stenosis as above. 2. Chronic degenerative disc bulge with facet hypertrophy at L5-S1 with resultant severe left greater than right L5 foraminal stenosis. 3. Disc bulge with facet hypertrophy at L2-3 with resultant mild canal and moderate right lateral recess stenosis. 4. 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     Electronically Signed   By: Rise Mu M.D.   On: 11/11/2020 02:25    Left sided low back pain still doing ok- excellent response post RF as above  Never went to PT due to BP issues  Buttocks pain a little  Pain Inventory Average Pain 5 Pain Right Now 5 My pain is constant and aching  In the last 24 hours, has pain interfered with the following? General activity 5 Relation with others 0 Enjoyment of life 0 What TIME of day is your pain at its worst? morning , daytime, and evening Sleep (in general) Good  Pain is worse with: walking and standing Pain improves with: rest and heat/ice Relief from Meds:  .  Family History  Problem Relation Age of Onset   Hypertension Mother    Arthritis Mother    Cancer Sister 106       Breast   Cancer Sister 73       Breast   Arthritis Father    Diabetes Father    Heart attack Sister    Drug abuse Sister    Colon cancer Neg Hx    Esophageal cancer Neg Hx    Rectal cancer Neg Hx    Stomach cancer Neg Hx    Social History   Socioeconomic History   Marital status: Married    Spouse name: Not on file   Number of children: 1   Years of education: Not on file   Highest education level: Not on file  Occupational History   Occupation: Dentist: RETIRED  Tobacco Use   Smoking status: Former    Packs/day: 1.00    Years: 4.00    Additional pack years: 0.00    Total pack years: 4.00    Types: Cigarettes    Quit date: 10/20/1979    Years since quitting: 43.3    Smokeless tobacco: Never  Vaping Use   Vaping Use: Never used  Substance and Sexual Activity   Alcohol use: No   Drug use: No   Sexual activity: Yes  Other Topics Concern   Not on file  Social History Narrative   Lives with husband one adult son with autism and a granddgt, no etoh, Mother of 14 adopted children, many with special needs .Regular exercise-no   Social Determinants of Health   Financial Resource Strain: Low Risk  (02/18/2023)   Overall Financial Resource Strain (CARDIA)    Difficulty of Paying Living Expenses: Not hard at all  Food Insecurity: No Food Insecurity (02/18/2023)   Hunger Vital Sign    Worried About Running Out of Food in the Last Year: Never true    Ran Out of Food in the Last Year: Never true  Transportation Needs: No Transportation Needs (02/18/2023)   PRAPARE - Administrator, Civil Service (Medical): No    Lack of Transportation (Non-Medical): No  Physical Activity: Insufficiently Active (02/18/2023)   Exercise Vital Sign    Days of Exercise per Week: 2 days    Minutes of Exercise per Session: 20 min  Stress: No Stress Concern Present (02/18/2023)  [employer] of Occupational Health - Occupational Stress Questionnaire    Feeling of Stress : Not at all  Social Connections: Socially Integrated (02/18/2023)   Social Connection and Isolation Panel [NHANES]    Frequency of Communication with Friends and Family: More than three times a week    Frequency of Social Gatherings with Friends and Family: Twice a week    Attends Religious Services: 1 to 4 times per year    Active Member of [employer] or Organizations: Yes    Attends Banker Meetings: Never    Marital Status: Married  Recent Concern: Social Connections - Moderately Isolated (12/01/2022)   Social Connection and Isolation Panel [NHANES]    Frequency of Communication with Friends and Family: More than three times a week    Frequency of Social Gatherings with Friends and Family:  Twice a week    Attends Religious Services: Never    Database administrator or Organizations: Yes    Attends Banker Meetings: Never    Marital Status: Divorced   Past Surgical History:  Procedure Laterality Date   ANTERIOR CERVICAL DECOMP/DISCECTOMY FUSION  04/28/2012   Procedure: ANTERIOR CERVICAL DECOMPRESSION/DISCECTOMY FUSION 3 LEVELS;  Surgeon: Cristi Loron, MD;  Location: [facility];  Service: Neurosurgery;  Laterality: N/A;  Cervical three-four,Cervical four-five,Cervical five-six anterior cervical decompression with fusion interbody prothesis plating and bonegraft   BREAST BIOPSY  07/2009   S/P Breast Biopsy [facility], [place], 07/2009): Benign findings.  (10/27/2010)   CARPAL TUNNEL RELEASE Left 12/10/2016   Procedure: LEFT CARPAL TUNNEL RELEASE;  Surgeon: Cindee Salt, MD;  Location: [facility];  Service: Orthopedics;  Laterality: Left;   COLONOSCOPY W/ POLYPECTOMY  12/2000   colonoscopy (Dr Loreta Ave) int. hemorrhoids &  nonneoplatic colon polyps - 01/10/2001,    COLONOSCOPY W/ POLYPECTOMY  01/2012   Dr Rhea Belton (GI).sigmoid colon, hyperlastic polyp   ENDOMETRIAL BIOPSY  08/2004   Endometrial Biopsy - 09/01/2004,   NM MYOVIEW LTD  04/2004   Cardiolite:EF63%, no ischemia - 05/01/2004,    REPLACEMENT TOTAL KNEE BILATERAL     Past Surgical History:  Procedure Laterality Date   ANTERIOR CERVICAL DECOMP/DISCECTOMY FUSION  04/28/2012   Procedure: ANTERIOR CERVICAL DECOMPRESSION/DISCECTOMY FUSION 3 LEVELS;  Surgeon: Cristi Loron, MD;  Location: [facility];  Service: Neurosurgery;  Laterality: N/A;  Cervical three-four,Cervical four-five,Cervical five-six anterior cervical decompression with fusion interbody prothesis plating and bonegraft   BREAST BIOPSY  07/2009   S/P Breast Biopsy [facility], [place], 07/2009): Benign findings.  (10/27/2010)   CARPAL TUNNEL RELEASE Left 12/10/2016   Procedure: LEFT CARPAL TUNNEL RELEASE;   Surgeon: Cindee Salt, MD;  Location: [facility];  Service: Orthopedics;  Laterality: Left;   COLONOSCOPY W/ POLYPECTOMY  12/2000   colonoscopy (Dr Loreta Ave) int. hemorrhoids &  nonneoplatic colon polyps - 01/10/2001,    COLONOSCOPY W/ POLYPECTOMY  01/2012   Dr Rhea Belton (GI).sigmoid colon, hyperlastic polyp   ENDOMETRIAL BIOPSY  08/2004   Endometrial Biopsy - 09/01/2004,   NM MYOVIEW LTD  04/2004   Cardiolite:EF63%, no ischemia - 05/01/2004,    REPLACEMENT TOTAL KNEE BILATERAL     Past Medical History:  Diagnosis Date   Acute MEE (middle ear effusion) 06/07/2014   Acute recurrent sinusitis 12/21/2014   Acute sinusitis 12/21/2014   Allergy    Anemia    hx   Asthma, intermittent 11/02/2018   ATTENTION DEFICIT, W/O HYPERACTIVITY 12/16/2006   Bilateral carpal tunnel  syndrome 11/20/2016   Cataract    Cervical spondylosis with myelopathy, History of 04/28/2012   COLON POLYP 12/16/2006   (02/03/12) Surgical [Pathology], sigmoid colon, polyp HYPERPLASTIC POLYP(S) AND POLYPOID FRAGMETNS OF BENIGN COLONIC MUCOSA WITH INTRAMUCOSAL LYMPHOID AGGREGATES. NO ADENOMATOUS CHANGE OR MALIGNANCY IDENTIFIED.    DIABETES MELLITUS II, UNCOMPLICATED 12/16/2006   DIFFUSE IDIOPATHIC SKELETAL HYPEROSTOSIS 03/29/2008   Fall 10/18/2020   GERD (gastroesophageal reflux disease)    H/O abnormal mammogram 07/19/2009   S/P Breast Biopsy [facility], [place], 07/2009): Benign findings.     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   HEMORRHOIDS, NOS 12/16/2006   Qualifier: History of  By: McDiarmid MD, Tawanna Cooler     History of blood transfusion 10/2004   with knee replacement   History of peptic ulcer 12/16/2006   UGI series PUD - 10/19/1998     History of right greater trochanteric bursitis 11/07/2009   HYPERCHOLESTEROLEMIA 02/28/2007   HYPERTENSION, BENIGN SYSTEMIC 12/16/2006   Hyponatremia 08/30/2015   INCONTINENCE, URGE 12/16/2006   Qualifier: History of  By: McDiarmid MD, Todd     Osteoarthritis of  finger of right hand 11/20/2016   OSTEOARTHRITIS, MULTI SITES 12/16/2006   Perennial allergic rhinitis with seasonal variation 12/16/2006        PONV (postoperative nausea and vomiting)    last surgery only   Recurrent maxillary sinusitis    SACROILIITIS, HISTORY OF 01/08/2009   Qualifier: History of  By: McDiarmid MD, Todd     Seasonal allergies    Seborrheic keratosis 01/15/2016   Ulcer    Uterine fibroid 02/02/2011   TVUS: 4cm fibroid w/ submucosal component, bil.hydrosalpinges, unable measurable endometrium - 08/18/2004    Ht 5\' 2"  (1.575 m)   BMI 37.38 kg/m   Opioid Risk Score:   Fall Risk Score:  `1  Depression screen [facility] 2/9     02/18/2023    3:01 PM 01/14/2023   10:49 AM 12/10/2022   11:07 AM 12/01/2022    1:12 PM 11/26/2022   10:31 AM 11/12/2022    4:24 PM 10/01/2022   11:38 AM  Depression screen PHQ 2/9  Decreased Interest 0 0 0 0 0 0 0  Down, Depressed, Hopeless 0 0 0 0 0 0 0  PHQ - 2 Score 0 0 0 0 0 0 0  Altered sleeping  0 0  0  0  Tired, decreased energy  0 0  1  1  Change in appetite  0 0  0  1  Feeling bad or failure about yourself   0 0  0  0  Trouble concentrating  0 0  0  0  Moving slowly or fidgety/restless  0 0  1  0  Suicidal thoughts  0 0  0  0  PHQ-9 Score  0 0  2  2  Difficult doing work/chores     Somewhat difficult        Review of Systems  Musculoskeletal:  Positive for back pain and gait problem.  All other systems reviewed and are negative.     Objective:   Physical Exam General no acute distress Mood and affect are appropriate Negative straight leg raising bilaterally. No tenderness palpation on the left paraspinal lumbar area There is tenderness along the right lumbar paraspinal mainly at the lumbosacral junction.  In addition there is pain along the midline at the same level. Patient with very limited extension causing increased pain in the middle of the back in the lower lumbar region.  Lumbar flexion is normal without pain.  She  sits comfortably. She ambulates without evidence of toe drop or knee instability.  Lower extremity sensation is normal bilaterally. Motor strength is 5/5 bilateral hip flexor knee extensor ankle dorsiflexor and plantar flexor.      Assessment & Plan:  1.  Lumbar spondylosis without myelopathy.  She does have evidence of lumbar spinal stenosis on MRI however no significant neurogenic claudication symptoms or any significant radicular symptoms at this time. She has had a very good and prolonged result with left-sided L3-4 medial branch and L5 dorsal ramus radiofrequency neurotomy performed approximately 7 months ago.  We discussed that it is difficult to predict duration of effect.  We discussed that the radiofrequency may be repeated if pain returns on the left side. On the right side and mid back pain, likely due to right lower lumbar facet mediated pain.  She has had good results x 2 with medial branch blocks L3-L4 L as well as L5 dorsal ramus injection under fluoroscopic guidance.  Would recommend proceeding with radiofrequency neurotomy of the same levels Discussed with patient agrees with plan.

## 2023-02-24 ENCOUNTER — Other Ambulatory Visit: Payer: Self-pay

## 2023-02-24 ENCOUNTER — Ambulatory Visit: Payer: Self-pay | Admitting: *Deleted

## 2023-02-24 DIAGNOSIS — M47817 Spondylosis without myelopathy or radiculopathy, lumbosacral region: Secondary | ICD-10-CM

## 2023-02-24 DIAGNOSIS — M48062 Spinal stenosis, lumbar region with neurogenic claudication: Secondary | ICD-10-CM

## 2023-02-24 DIAGNOSIS — F988 Other specified behavioral and emotional disorders with onset usually occurring in childhood and adolescence: Secondary | ICD-10-CM

## 2023-02-24 DIAGNOSIS — Q762 Congenital spondylolisthesis: Secondary | ICD-10-CM

## 2023-02-24 MED ORDER — HYDROCODONE-ACETAMINOPHEN 7.5-325 MG PO TABS: 1.0000 | ORAL_TABLET | Freq: Three times a day (TID) | ORAL | 0 refills | Status: DC | PRN

## 2023-02-24 MED ORDER — METHYLPHENIDATE HCL 10 MG PO TABS: 20.0000 mg | ORAL_TABLET | Freq: Three times a day (TID) | ORAL | 0 refills | Status: DC

## 2023-02-24 NOTE — Patient Outreach (Signed)
  Care Coordination   Follow Up Visit Note ACO Reach  HEP   02/24/2023 Name: VANESSIA ROSZKOWSKI MRN: [id number] DOB: 1951-02-05  CRYSTOL ISENBARGER is a 72 y.o. year old female who sees McDiarmid, Leighton Roach, MD for primary care. I spoke with  Sunday Corn by phone today.  What matters to the patients health and wellness today?  Keeping blood pressure and DM controlled     Goals Addressed             This Visit's Progress    Effective management of chronic medical conditions (HTN/DM)       Care Coordination Interventions: Provided education to patient about basic DM disease process Reviewed medications with patient and discussed importance of medication adherence Counseled on importance of regular laboratory monitoring as prescribed Discussed plans with patient for ongoing care management follow up and provided patient with direct contact information for care management team Provided patient with written educational materials related to hypo and hyperglycemia and importance of correct treatment Evaluation of current treatment plan related to hypertension self management and patient's adherence to plan as established by provider Reviewed medications with patient and discussed importance of compliance Discussed plans with patient for ongoing care management follow up and provided patient with direct contact information for care management team Advised patient, providing education and rationale, to monitor blood pressure daily and record, calling PCP for findings outside established parameters         SDOH assessments and interventions completed:  No     Care Coordination Interventions:  Yes, provided   Interventions Today    Flowsheet Row Most Recent Value  Chronic Disease   Chronic disease during today's visit Diabetes, Hypertension (HTN)  General Interventions   General Interventions Discussed/Reviewed General Interventions Reviewed, Labs, Doctor Visits  Labs Hgb A1c every 3 months   [Recent A1C 6.8]  Doctor Visits Discussed/Reviewed Doctor Visits Reviewed, PCP, Specialist, Annual Wellness Visits  PCP/Specialist Visits Compliance with follow-up visit  [AWV done on 5/2]  Exercise Interventions   Exercise Discussed/Reviewed Physical Activity  Physical Activity Discussed/Reviewed Physical Activity Reviewed, Gym  [increasing walking]  Education Interventions   Education Provided Provided Education  Provided Verbal Education On Blood Sugar Monitoring, Nutrition, When to see the doctor, Medication  [Does not monitor blood sugar, monitors blood pressure, range 130-140s/70s]  Nutrition Interventions   Nutrition Discussed/Reviewed Nutrition Reviewed, Decreasing salt, Decreasing sugar intake  [Drinks zero gatorade]       Follow up plan: Follow up call scheduled for 7/24    Encounter Outcome:  Pt. Visit Completed   Kemper Durie, RN, MSN, [facility] Care Management Care Management Coordinator [phone]

## 2023-03-03 ENCOUNTER — Telehealth: Payer: Self-pay

## 2023-03-03 NOTE — Telephone Encounter (Signed)
Received call from patient regarding PA on oxycodone-acetaminophen.   Copay $8.20. Patient states that she is only supposed to pay $4. Called pharmacy. Patient has primary of [employer], however, Medicaid is secondary insurance. Medicaid is requiring PA.   Submitted PA through [employer].   Confirmation number: [id number] W  Will check status in 24-48 hours.   Veronda Prude, RN

## 2023-03-05 ENCOUNTER — Other Ambulatory Visit (HOSPITAL_COMMUNITY): Payer: Self-pay

## 2023-03-19 NOTE — Telephone Encounter (Signed)
Delay in documentation.   Medication approved through [employer] from 03/03/23-08/30/23.   Veronda Prude, RN

## 2023-03-23 ENCOUNTER — Encounter: Payer: Medicare Other | Admitting: Physical Medicine & Rehabilitation

## 2023-04-06 ENCOUNTER — Other Ambulatory Visit: Payer: Self-pay

## 2023-04-06 DIAGNOSIS — M47817 Spondylosis without myelopathy or radiculopathy, lumbosacral region: Secondary | ICD-10-CM

## 2023-04-06 DIAGNOSIS — M48062 Spinal stenosis, lumbar region with neurogenic claudication: Secondary | ICD-10-CM

## 2023-04-06 DIAGNOSIS — F988 Other specified behavioral and emotional disorders with onset usually occurring in childhood and adolescence: Secondary | ICD-10-CM

## 2023-04-06 DIAGNOSIS — Q762 Congenital spondylolisthesis: Secondary | ICD-10-CM

## 2023-04-07 MED ORDER — METHYLPHENIDATE HCL 10 MG PO TABS: 20.0000 mg | ORAL_TABLET | Freq: Three times a day (TID) | ORAL | 0 refills | Status: DC

## 2023-04-07 MED ORDER — HYDROCODONE-ACETAMINOPHEN 7.5-325 MG PO TABS: 1.0000 | ORAL_TABLET | Freq: Three times a day (TID) | ORAL | 0 refills | Status: DC | PRN

## 2023-05-04 ENCOUNTER — Encounter: Payer: Medicare Other | Admitting: Physical Medicine & Rehabilitation

## 2023-05-04 ENCOUNTER — Encounter: Payer: Self-pay | Admitting: Physical Medicine & Rehabilitation

## 2023-05-04 VITALS — BP 152/75 | HR 89 | Ht 62.0 in | Wt 206.2 lb

## 2023-05-04 DIAGNOSIS — M47817 Spondylosis without myelopathy or radiculopathy, lumbosacral region: Secondary | ICD-10-CM

## 2023-05-04 MED ORDER — LIDOCAINE HCL 1 % IJ SOLN: 10.0000 mL | Freq: Once | INTRAMUSCULAR | Status: DC

## 2023-05-04 MED ORDER — LIDOCAINE HCL (PF) 2 % IJ SOLN: 3.0000 mL | Freq: Once | INTRAMUSCULAR | Status: DC

## 2023-05-04 NOTE — Progress Notes (Signed)
Scheduled for RightL5 dorsal ramus., Right L4 and Right L3 medial branch radio frequency neurotomy under fluoroscopic guidance  C arm demonstrated voltage error persisting after 2 reboot attempts,  procedure never started.  Pt to be rescheduled pending eval by [employer] +/- [employer] tech

## 2023-05-04 NOTE — Progress Notes (Deleted)
  PROCEDURE RECORD  Physical Medicine and Rehabilitation   Name: [name] DOB:08-05-1951 MRN: [id number]  Date:05/04/2023  Physician: Claudette Laws, MD    Nurse/CMA: Sharmeka Palmisano RN  Allergies:  Allergies  Allergen Reactions   Carvedilol [Coreg] Shortness Of Breath and Other (See Comments)    Chest tightness   Januvia [Sitagliptin] Other (See Comments)    Epigastric abdominal pain   Jardiance [Empagliflozin] Other (See Comments)    Lightheadedness, near-syncope, extreme yeast problems   Amlodipine Other (See Comments)    Leg edema   Celecoxib Palpitations    REACTION: palpitations   Diclofenac-Misoprostol Palpitations    REACTION: palpitation    Consent Signed: Yes.    Is patient diabetic? Yes.    CBG today? Not checked  Pregnant: No. LMP: No LMP recorded. Patient is postmenopausal. (age 35-55)  Anticoagulants: no Anti-inflammatory: no Antibiotics: no  Procedure: right lumbar 3-4-5 radiofrequency neurotomy  Position: Prone Start Time: ***  End Time: ***  Fluoro Time: ***  RN/CMA Karlisa Gaubert RN ***    Time 1:54 ***    BP 152/75 ***    Pulse 89 ***    Respirations 14 ***    O2 Sat 96 ***    S/S 6/6 ***    Pain Level 7/10 ***     D/C home with husband, patient A & O X 3, D/C instructions reviewed, and sits independently.

## 2023-05-11 ENCOUNTER — Other Ambulatory Visit: Payer: Self-pay | Admitting: Family Medicine

## 2023-05-11 DIAGNOSIS — M47817 Spondylosis without myelopathy or radiculopathy, lumbosacral region: Secondary | ICD-10-CM

## 2023-05-11 DIAGNOSIS — Q762 Congenital spondylolisthesis: Secondary | ICD-10-CM

## 2023-05-12 ENCOUNTER — Ambulatory Visit: Payer: Self-pay | Admitting: *Deleted

## 2023-05-12 NOTE — Patient Outreach (Signed)
  Care Coordination   Follow Up Visit Note   05/12/2023 Name: [name] MRN: [id number] DOB: [birth year]  [name] is a 72 y.o. year old female who sees McDiarmid, Leighton Roach, MD for primary care. I spoke with  [name] by phone today.  What matters to the patients health and wellness today?  Keep A1C down    Goals Addressed             This Visit's Progress    Effective management of chronic medical conditions (HTN/DM)   On track    Care Coordination Interventions: Provided education to patient about basic DM disease process Reviewed medications with patient and discussed importance of medication adherence Counseled on importance of regular laboratory monitoring as prescribed Discussed plans with patient for ongoing care management follow up and provided patient with direct contact information for care management team Provided patient with written educational materials related to hypo and hyperglycemia and importance of correct treatment Evaluation of current treatment plan related to hypertension self management and patient's adherence to plan as established by provider Reviewed medications with patient and discussed importance of compliance Discussed plans with patient for ongoing care management follow up and provided patient with direct contact information for care management team Advised patient, providing education and rationale, to monitor blood pressure daily and record, calling PCP for findings outside established parameters         SDOH assessments and interventions completed:  No     Care Coordination Interventions:  Yes, provided   Interventions Today    Flowsheet Row Most Recent Value  Chronic Disease   Chronic disease during today's visit Diabetes  General Interventions   General Interventions Discussed/Reviewed General Interventions Reviewed, Annual Eye Exam, Annual Foot Exam, Labs, Doctor Visits, Durable Medical Equipment (DME)  Labs Hgb A1c every  3 months  Doctor Visits Discussed/Reviewed Doctor Visits Reviewed, PCP  [PCP 8/1]  Durable Medical Equipment (DME) BP Cuff  PCP/Specialist Visits Compliance with follow-up visit  Education Interventions   Education Provided Provided Education  Provided Verbal Education On Foot Care, Eye Care, Medication       Follow up plan: Follow up call scheduled for 10/7    Encounter Outcome:  Pt. Visit Completed   Kemper Durie, RN, MSN, [facility] Care Management Care Management Coordinator [phone]

## 2023-05-14 ENCOUNTER — Other Ambulatory Visit: Payer: Self-pay

## 2023-05-14 DIAGNOSIS — M48062 Spinal stenosis, lumbar region with neurogenic claudication: Secondary | ICD-10-CM

## 2023-05-14 DIAGNOSIS — M47817 Spondylosis without myelopathy or radiculopathy, lumbosacral region: Secondary | ICD-10-CM

## 2023-05-14 DIAGNOSIS — Q762 Congenital spondylolisthesis: Secondary | ICD-10-CM

## 2023-05-14 DIAGNOSIS — F988 Other specified behavioral and emotional disorders with onset usually occurring in childhood and adolescence: Secondary | ICD-10-CM

## 2023-05-14 MED ORDER — HYDROCODONE-ACETAMINOPHEN 7.5-325 MG PO TABS: 1.0000 | ORAL_TABLET | Freq: Three times a day (TID) | ORAL | 0 refills | Status: DC | PRN

## 2023-05-14 MED ORDER — METHYLPHENIDATE HCL 10 MG PO TABS
20.0000 mg | ORAL_TABLET | Freq: Three times a day (TID) | ORAL | 0 refills | Status: DC
Start: 2023-05-14 — End: 2023-06-15

## 2023-05-20 ENCOUNTER — Ambulatory Visit (INDEPENDENT_AMBULATORY_CARE_PROVIDER_SITE_OTHER): Payer: Medicare Other | Admitting: Family Medicine

## 2023-05-20 ENCOUNTER — Encounter: Payer: Self-pay | Admitting: Family Medicine

## 2023-05-20 VITALS — BP 131/68 | HR 90 | Ht 62.0 in | Wt 202.0 lb

## 2023-05-20 DIAGNOSIS — Z1231 Encounter for screening mammogram for malignant neoplasm of breast: Secondary | ICD-10-CM | POA: Diagnosis not present

## 2023-05-20 DIAGNOSIS — E1149 Type 2 diabetes mellitus with other diabetic neurological complication: Secondary | ICD-10-CM

## 2023-05-20 DIAGNOSIS — Z79899 Other long term (current) drug therapy: Secondary | ICD-10-CM

## 2023-05-20 DIAGNOSIS — I152 Hypertension secondary to endocrine disorders: Secondary | ICD-10-CM | POA: Diagnosis not present

## 2023-05-20 DIAGNOSIS — E1159 Type 2 diabetes mellitus with other circulatory complications: Secondary | ICD-10-CM | POA: Diagnosis not present

## 2023-05-20 DIAGNOSIS — Z0000 Encounter for general adult medical examination without abnormal findings: Secondary | ICD-10-CM | POA: Diagnosis not present

## 2023-05-20 LAB — POCT GLYCOSYLATED HEMOGLOBIN (HGB A1C): HbA1c, POC (controlled diabetic range): 7.2 % — AB (ref 0.0–7.0)

## 2023-05-20 NOTE — Patient Instructions (Signed)
Your A1c 7.2% is up slightly.  You may increase your metformin to 3 tablets in a day. Working on [employer] will also be an option.    See you back in three months to recheck your A1c.  Your blood pressure looks good.  Please Keep taking your blood pressure medications.

## 2023-05-21 ENCOUNTER — Encounter: Payer: Self-pay | Admitting: Family Medicine

## 2023-05-21 NOTE — Progress Notes (Signed)
Sunday Corn is accompanied by patient and Danterrielle Sources of clinical information for visit is/are patient. Nursing assessment for this office visit was reviewed with the patient for accuracy and revision.     Previous Report(s) Reviewed: none     05/20/2023   10:32 AM  Depression screen PHQ 2/9  Decreased Interest 0  Down, Depressed, Hopeless 0  PHQ - 2 Score 0  Altered sleeping 0  Tired, decreased energy 1  Change in appetite 0  Feeling bad or failure about yourself  0  Trouble concentrating 0  Suicidal thoughts 0  PHQ-9 Score 1  Difficult doing work/chores Not difficult at all   [employer] Office Visit from 05/20/2023 in [facility] Office Visit from 01/14/2023 in [facility] Office Visit from 12/10/2022 in [facility]  Thoughts that you would be better off dead, or of hurting yourself in some way Not at all Not at all Not at all  PHQ-9 Total Score 1 0 0          05/20/2023   10:33 AM 02/18/2023    3:01 PM 12/01/2022    1:32 PM 11/26/2022   10:30 AM 10/01/2022   11:38 AM  Fall Risk   Falls in the past year? 0 0 0 0 0  Number falls in past yr: 0 0 0 0 0  Injury with Fall? 0 0 0 0 0  Risk for fall due to :  No Fall Risks     Follow up  Falls evaluation completed          05/20/2023   10:32 AM 02/23/2023   12:37 PM 02/18/2023    3:01 PM  PHQ9 SCORE ONLY  PHQ-9 Total Score 1 0 0    There are no preventive care reminders to display for this patient.  Health Maintenance Due  Topic Date Due   Zoster Vaccines- Shingrix (1 of 2) 11/06/1968   DTaP/Tdap/Td (2 - Tdap) 01/18/2012   MAMMOGRAM  10/15/2018   INFLUENZA VACCINE  05/20/2023      History/P.E. limitations: none  There are no preventive care reminders to display for this patient.  Diabetes Health Maintenance Due  Topic Date Due   OPHTHALMOLOGY EXAM  10/23/2023   HEMOGLOBIN A1C  11/20/2023   FOOT EXAM  11/27/2023   LIPID PANEL  11/08/2024     Health Maintenance Due  Topic Date Due   Zoster Vaccines- Shingrix (1 of 2) 11/06/1968   DTaP/Tdap/Td (2 - Tdap) 01/18/2012   MAMMOGRAM  10/15/2018   INFLUENZA VACCINE  05/20/2023     No chief complaint on file.    --------------------------------------------------------------------------------------------------------------------------------------------- Visit Problem List with A/P  Hypertension associated with diabetes (HCC) Established problem. Adequate blood pressure control.  No evidence of new end organ damage.  Tolerating medication without significant adverse effects.  Plan to continue current blood pressure medication regiment.    Type 2 diabetes mellitus with neurological complications (HCC) Established problem Well Controlled. Patient is at goal of A1c < 7.5%, but up slightly to 7.2% from prior 6.8%. Ms Nutting attributes slight rise in A1c from increase intake of fruit in last several months.  She continues to take the metformin 500 mg twice a day  Discussed options of increasing metformin to 1500 mg daily along with diet changes, or diet changes alone.  Ms Brockway has always been able to make an effective change, either with increasing or decreasing her metformin or making effective dietary  changes.  Will recheck her A1c in 3 months to see the efficacy of the changes she made.   Healthcare maintenance Written Prescription for screening mammogram at [facility] given to Ms Wentland.

## 2023-05-21 NOTE — Assessment & Plan Note (Signed)
Established problem Well Controlled. Patient is at goal of A1c < 7.5%, but up slightly to 7.2% from prior 6.8%. [name] attributes slight rise in A1c from increase intake of fruit in last several months.  She continues to take the metformin 500 mg twice a day  Discussed options of increasing metformin to 1500 mg daily along with diet changes, or diet changes alone.  [name] has always been able to make an effective change, either with increasing or decreasing her metformin or making effective dietary changes.  Will recheck her A1c in 3 months to see the efficacy of the changes she made.

## 2023-05-21 NOTE — Assessment & Plan Note (Signed)
Written Prescription for screening mammogram at [facility] given to Ms Braver.

## 2023-05-21 NOTE — Assessment & Plan Note (Signed)
Established problem. Adequate blood pressure control.  No evidence of new end organ damage.  Tolerating medication without significant adverse effects.  Plan to continue current blood pressure medication regiment.   

## 2023-06-08 NOTE — Progress Notes (Deleted)
Subjective:    Patient ID: [name], female    DOB: 01-29-1951, 72 y.o.   MRN: [id number]  HPI   PROCEDURE RECORD [name]   Name: [name] DOB:07-Oct-1951 MRN: [id number]  Date:06/08/2023  Physician: Barbara Laws, MD    Nurse/CMA: Barbara Carwin MA  Allergies:  Allergies  Allergen Reactions   Carvedilol [Coreg] Shortness Of Breath and Other (See Comments)    Chest tightness   Januvia [Sitagliptin] Other (See Comments)    Epigastric abdominal pain   Jardiance [Empagliflozin] Other (See Comments)    Lightheadedness, near-syncope, extreme yeast problems   Amlodipine Other (See Comments)    Leg edema   Celecoxib Palpitations    REACTION: palpitations   Diclofenac-Misoprostol Palpitations    REACTION: palpitation    Consent Signed: {yes XB:284132}  Is patient diabetic? {yes no:314532}  CBG today? ***  Pregnant: {yes no:314532} LMP: No LMP recorded. Patient is postmenopausal. (age 58-55)  Anticoagulants: {Yes/No:19989} Anti-inflammatory: {Yes/No:19989} Antibiotics: {Yes/No:19989}  Procedure: Right L5 dorsal ramus.Right L4 & Right L3 Radiofrequency  Position: Prone Start Time: ***  End Time: ***  Fluoro Time: ***  RN/CMA Barbara Fairburn MA Barbara Conklin MA    Time      BP      Pulse      Respirations      O2 Sat      S/S      Pain Level       D/C home with ***, patient A & O X 3, D/C instructions reviewed, and sits independently.        Review of Systems     Objective:   Physical Exam        Assessment & Plan:

## 2023-06-11 ENCOUNTER — Encounter: Payer: Medicare Other | Admitting: Physical Medicine & Rehabilitation

## 2023-06-15 ENCOUNTER — Other Ambulatory Visit: Payer: Self-pay

## 2023-06-15 DIAGNOSIS — M47817 Spondylosis without myelopathy or radiculopathy, lumbosacral region: Secondary | ICD-10-CM

## 2023-06-15 DIAGNOSIS — F988 Other specified behavioral and emotional disorders with onset usually occurring in childhood and adolescence: Secondary | ICD-10-CM

## 2023-06-15 DIAGNOSIS — M48062 Spinal stenosis, lumbar region with neurogenic claudication: Secondary | ICD-10-CM

## 2023-06-15 DIAGNOSIS — Q762 Congenital spondylolisthesis: Secondary | ICD-10-CM

## 2023-06-16 MED ORDER — HYDROCODONE-ACETAMINOPHEN 7.5-325 MG PO TABS
1.0000 | ORAL_TABLET | Freq: Three times a day (TID) | ORAL | 0 refills | Status: DC | PRN
Start: 2023-06-16 — End: 2023-07-15

## 2023-06-16 MED ORDER — METHYLPHENIDATE HCL 10 MG PO TABS
20.0000 mg | ORAL_TABLET | Freq: Three times a day (TID) | ORAL | 0 refills | Status: DC
Start: 2023-06-16 — End: 2023-07-15

## 2023-07-14 NOTE — Progress Notes (Deleted)
PROCEDURE RECORD [facility]   Name: [name] DOB:[birth year] MRN: [id number]  Date:07/14/2023  Physician: Claudette Laws, MD    Nurse/CMA: Charise Carwin MA  Allergies:  Allergies  Allergen Reactions   Carvedilol [Coreg] Shortness Of Breath and Other (See Comments)    Chest tightness   Januvia [Sitagliptin] Other (See Comments)    Epigastric abdominal pain   Jardiance [Empagliflozin] Other (See Comments)    Lightheadedness, near-syncope, extreme yeast problems   Amlodipine Other (See Comments)    Leg edema   Celecoxib Palpitations    REACTION: palpitations   Diclofenac-Misoprostol Palpitations    REACTION: palpitation    Consent Signed: {yes EX:[id number]}  Is patient diabetic? {yes no:314532}  CBG today? ***  Pregnant: {yes no:314532} LMP: No LMP recorded. Patient is postmenopausal. (age 1-55)  Anticoagulants: {Yes/No:19989} Anti-inflammatory: {Yes/No:19989} Antibiotics: {Yes/No:19989}  Procedure: Right L3,4,5 Radiofrequency  Position: Prone Start Time: ***  End Time: ***  Fluoro Time: ***  RN/CMA      Time      BP      Pulse      Respirations      O2 Sat      S/S      Pain Level       D/C home with ***, patient A & O X 3, D/C instructions reviewed, and sits independently.

## 2023-07-15 ENCOUNTER — Other Ambulatory Visit: Payer: Self-pay

## 2023-07-15 DIAGNOSIS — F988 Other specified behavioral and emotional disorders with onset usually occurring in childhood and adolescence: Secondary | ICD-10-CM

## 2023-07-15 DIAGNOSIS — M47817 Spondylosis without myelopathy or radiculopathy, lumbosacral region: Secondary | ICD-10-CM

## 2023-07-15 DIAGNOSIS — M48062 Spinal stenosis, lumbar region with neurogenic claudication: Secondary | ICD-10-CM

## 2023-07-15 DIAGNOSIS — Q762 Congenital spondylolisthesis: Secondary | ICD-10-CM

## 2023-07-15 MED ORDER — METHYLPHENIDATE HCL 10 MG PO TABS: 20.0000 mg | ORAL_TABLET | Freq: Three times a day (TID) | ORAL | 0 refills | Status: DC

## 2023-07-15 MED ORDER — HYDROCODONE-ACETAMINOPHEN 7.5-325 MG PO TABS
1.0000 | ORAL_TABLET | Freq: Three times a day (TID) | ORAL | 0 refills | Status: DC | PRN
Start: 2023-07-16 — End: 2023-08-16

## 2023-07-20 ENCOUNTER — Encounter: Payer: Medicare Other | Admitting: Physical Medicine & Rehabilitation

## 2023-07-26 ENCOUNTER — Ambulatory Visit: Payer: Self-pay | Admitting: *Deleted

## 2023-07-26 NOTE — Patient Outreach (Signed)
Care Coordination   Follow Up Visit Note   07/26/2023 Name: [name] MRN: [id number] DOB: [birth year]  [name] is a 72 y.o. year old female who sees McDiarmid, Leighton Roach, MD for primary care. I spoke with  [name] by phone today.  What matters to the patients health and wellness today?  Decrease fruit intake and drop A1C back down below 7    Goals Addressed             This Visit's Progress    Effective management of chronic medical conditions (HTN/DM)   On track    Care Coordination Interventions: Provided education to patient about basic DM disease process Reviewed medications with patient and discussed importance of medication adherence Counseled on importance of regular laboratory monitoring as prescribed Discussed plans with patient for ongoing care management follow up and provided patient with direct contact information for care management team Provided patient with written educational materials related to hypo and hyperglycemia and importance of correct treatment Evaluation of current treatment plan related to hypertension self management and patient's adherence to plan as established by provider Reviewed medications with patient and discussed importance of compliance Discussed plans with patient for ongoing care management follow up and provided patient with direct contact information for care management team Advised patient, providing education and rationale, to monitor blood pressure daily and record, calling PCP for findings outside established parameters         SDOH assessments and interventions completed:  No     Care Coordination Interventions:  Yes, provided   Interventions Today    Flowsheet Row Most Recent Value  Chronic Disease   Chronic disease during today's visit Diabetes, Hypertension (HTN)  General Interventions   General Interventions Discussed/Reviewed General Interventions Reviewed, Doctor Visits, Labs, Vaccines  Labs Hgb A1c every  3 months  [most recent increased to 7.2, will have repeat next month]  Vaccines Flu  Doctor Visits Discussed/Reviewed Doctor Visits Reviewed, PCP  [Will call PCP office to schedule follow up A1C and flu vaccine]  PCP/Specialist Visits Compliance with follow-up visit  Exercise Interventions   Exercise Discussed/Reviewed Physical Activity  Physical Activity Discussed/Reviewed Physical Activity Reviewed  Education Interventions   Education Provided Provided Education  Provided Verbal Education On Blood Sugar Monitoring, Medication, When to see the doctor, Nutrition  [Does not monitor blood sugar daily due to neuropathy, but able to manage with diet]  Nutrition Interventions   Nutrition Discussed/Reviewed Nutrition Reviewed, Carbohydrate meal planning, Decreasing sugar intake  [report A1C increased due to increase in fruit intake, will decrease and monitor sugar intake]  Advanced Directive Interventions   Advanced Directives Discussed/Reviewed Advanced Directives Discussed       Follow up plan: Follow up call scheduled for 1/6    Encounter Outcome:  Patient Visit Completed   Kemper Durie, RN, MSN, [facility] Care Management Care Management Coordinator [phone]

## 2023-07-27 NOTE — Patient Instructions (Signed)
Visit Information  Thank you for taking time to visit with me today. Please don't hesitate to contact me if I can be of assistance to you before our next scheduled telephone appointment.  Following are the goals we discussed today:  Monitor fruit intake, as well as overall sugar intake. Call PCP office to schedule follow up and repeat A1C. Schedule flu vaccination.   Our next appointment is by telephone on 1/6  Please call the care guide team at [phone] if you need to cancel or reschedule your appointment.   Please call the Suicide and Crisis Lifeline: 988 call the [place] National Suicide Prevention Lifeline: 719-474-1854 or TTY: 669-521-7644 TTY (671) 247-4315) to talk to a trained counselor call 1-800-273-TALK (toll free, 24 hour hotline) call 911 if you are experiencing a Mental Health or Behavioral Health Crisis or need someone to talk to.  Patient verbalizes understanding of instructions and care plan provided today and agrees to view in MyChart. Active MyChart status and patient understanding of how to access instructions and care plan via MyChart confirmed with patient.     The patient has been provided with contact information for the care management team and has been advised to call with any health related questions or concerns.   Kemper Durie [facility] Care Management Care Management Coordinator [phone]

## 2023-08-05 ENCOUNTER — Ambulatory Visit: Payer: Self-pay

## 2023-08-09 ENCOUNTER — Other Ambulatory Visit: Payer: Self-pay | Admitting: Family Medicine

## 2023-08-09 DIAGNOSIS — E1149 Type 2 diabetes mellitus with other diabetic neurological complication: Secondary | ICD-10-CM

## 2023-08-13 NOTE — Progress Notes (Deleted)
PROCEDURE RECORD [facility]   Name: DARLEEN BORDNER DOB:1951-05-17 MRN: [id number]  Date:08/13/2023  Physician: Claudette Laws, MD    Nurse/CMA: Charise Carwin MA  Allergies:  Allergies  Allergen Reactions   Carvedilol [Coreg] Shortness Of Breath and Other (See Comments)    Chest tightness   Januvia [Sitagliptin] Other (See Comments)    Epigastric abdominal pain   Jardiance [Empagliflozin] Other (See Comments)    Lightheadedness, near-syncope, extreme yeast problems   Amlodipine Other (See Comments)    Leg edema   Celecoxib Palpitations    REACTION: palpitations   Diclofenac-Misoprostol Palpitations    REACTION: palpitation    Consent Signed: {yes DG:[id number]}  Is patient diabetic? {yes no:314532}  CBG today? ***  Pregnant: {yes no:314532} LMP: No LMP recorded. Patient is postmenopausal. (age 9-55)  Anticoagulants: {Yes/No:19989} Anti-inflammatory: {Yes/No:19989} Antibiotics: {Yes/No:19989}  Procedure: Right L5 dorsal ramus, Right L4, L3  Radiofrequency   Position: Prone Start Time: ***  End Time: ***  Fluoro Time: ***  RN/CMA *** ***    Time      BP      Pulse      Respirations      O2 Sat      S/S      Pain Level       D/C home with ***, patient A & O X 3, D/C instructions reviewed, and sits independently.

## 2023-08-16 ENCOUNTER — Other Ambulatory Visit: Payer: Self-pay

## 2023-08-16 DIAGNOSIS — F988 Other specified behavioral and emotional disorders with onset usually occurring in childhood and adolescence: Secondary | ICD-10-CM

## 2023-08-16 DIAGNOSIS — M47817 Spondylosis without myelopathy or radiculopathy, lumbosacral region: Secondary | ICD-10-CM

## 2023-08-16 DIAGNOSIS — M48062 Spinal stenosis, lumbar region with neurogenic claudication: Secondary | ICD-10-CM

## 2023-08-16 DIAGNOSIS — Q762 Congenital spondylolisthesis: Secondary | ICD-10-CM

## 2023-08-16 MED ORDER — HYDROCODONE-ACETAMINOPHEN 7.5-325 MG PO TABS: 1.0000 | ORAL_TABLET | Freq: Three times a day (TID) | ORAL | 0 refills | Status: DC | PRN

## 2023-08-16 MED ORDER — METHYLPHENIDATE HCL 10 MG PO TABS
20.0000 mg | ORAL_TABLET | Freq: Three times a day (TID) | ORAL | 0 refills | Status: DC
Start: 2023-08-16 — End: 2023-10-15

## 2023-08-18 ENCOUNTER — Ambulatory Visit (INDEPENDENT_AMBULATORY_CARE_PROVIDER_SITE_OTHER): Payer: Medicare Other

## 2023-08-18 DIAGNOSIS — Z23 Encounter for immunization: Secondary | ICD-10-CM | POA: Diagnosis not present

## 2023-08-18 NOTE — Progress Notes (Signed)
Patient presents to nurse clinic for flu vaccination. Administered in LD, site unremarkable, tolerated injection well.   Cordarrel Stiefel C Arek Spadafore, RN   

## 2023-08-19 ENCOUNTER — Encounter: Payer: Medicare Other | Admitting: Physical Medicine & Rehabilitation

## 2023-09-02 ENCOUNTER — Encounter: Payer: Self-pay | Admitting: Family Medicine

## 2023-09-02 ENCOUNTER — Ambulatory Visit: Payer: Medicare Other | Admitting: Family Medicine

## 2023-09-02 VITALS — BP 152/69 | HR 90 | Ht 62.0 in | Wt 194.0 lb

## 2023-09-02 DIAGNOSIS — E1149 Type 2 diabetes mellitus with other diabetic neurological complication: Secondary | ICD-10-CM

## 2023-09-02 DIAGNOSIS — I152 Hypertension secondary to endocrine disorders: Secondary | ICD-10-CM | POA: Diagnosis not present

## 2023-09-02 DIAGNOSIS — E1159 Type 2 diabetes mellitus with other circulatory complications: Secondary | ICD-10-CM | POA: Diagnosis not present

## 2023-09-02 LAB — POCT GLYCOSYLATED HEMOGLOBIN (HGB A1C): HbA1c, POC (controlled diabetic range): 6.8 % (ref 0.0–7.0)

## 2023-09-02 NOTE — Progress Notes (Signed)
[name] is alone Sources of clinical information for visit is/are patient. Nursing assessment for this office visit was reviewed with the patient for accuracy and revision.     Previous Report(s) Reviewed: none     09/02/2023    2:52 PM  Depression screen PHQ 2/9  Decreased Interest 0  Down, Depressed, Hopeless 0  PHQ - 2 Score 0  Altered sleeping 0  Tired, decreased energy 0  Change in appetite 0  Feeling bad or failure about yourself  0  Trouble concentrating 0  Moving slowly or fidgety/restless 0  Suicidal thoughts 0  PHQ-9 Score 0   Flowsheet Row Office Visit from 09/02/2023 in [facility] [place]. [facility] Office Visit from 05/20/2023 in [facility] [place]. [facility] Office Visit from 01/14/2023 in [facility] Of Eligha [facility]. [facility]  Thoughts that you would be better off dead, or of hurting yourself in some way Not at all Not at all Not at all  PHQ-9 Total Score 0 1 0          09/02/2023    2:52 PM 05/20/2023   10:33 AM 02/18/2023    3:01 PM 12/01/2022    1:32 PM 11/26/2022   10:30 AM  Fall Risk   Falls in the past year? 0 0 0 0 0  Number falls in past yr: 0 0 0 0 0  Injury with Fall? 0 0 0 0 0  Risk for fall due to :   No Fall Risks    Follow up   Falls evaluation completed         09/02/2023    2:52 PM 05/20/2023   10:32 AM 02/23/2023   12:37 PM  PHQ9 SCORE ONLY  PHQ-9 Total Score 0 1 0    There are no preventive care reminders to display for this patient.  Health Maintenance Due  Topic Date Due   Zoster Vaccines- Shingrix (1 of 2) 11/06/1968   DTaP/Tdap/Td (2 - Tdap) 01/18/2012   MAMMOGRAM  10/15/2018      History/P.E. limitations: none  There are no preventive care reminders to display for this patient.  Diabetes Health Maintenance Due  Topic Date Due   OPHTHALMOLOGY EXAM  10/23/2023   FOOT EXAM  11/27/2023   HEMOGLOBIN A1C   03/01/2024   LIPID PANEL  11/08/2024    Health Maintenance Due  Topic Date Due   Zoster Vaccines- Shingrix (1 of 2) 11/06/1968   DTaP/Tdap/Td (2 - Tdap) 01/18/2012   MAMMOGRAM  10/15/2018     No chief complaint on file.    --------------------------------------------------------------------------------------------------------------------------------------------- Visit Problem List with A/P  Type 2 diabetes mellitus with neurological complications (HCC) Established problem that has improved and has meet goal of A1c < 7.5%.  Continue current medication Metformin XR 500 mg twice a day. RTC 4-6 months   Hypertension associated with diabetes (HCC) Established problem. Adequate blood pressure control.  No evidence of new end organ damage.  Tolerating medication without significant adverse effects.  Plan to continue current blood pressure medication regiment.

## 2023-09-02 NOTE — Patient Instructions (Signed)
Your diabetes is under very good control  Keep taking your medication like your are.

## 2023-09-03 ENCOUNTER — Encounter: Payer: Self-pay | Admitting: Family Medicine

## 2023-09-03 NOTE — Assessment & Plan Note (Signed)
Established problem that has improved and has meet goal of A1c < 7.5%.  Continue current medication Metformin XR 500 mg twice a day. RTC 4-6 months

## 2023-09-03 NOTE — Assessment & Plan Note (Signed)
Established problem. Adequate blood pressure control.  No evidence of new end organ damage.  Tolerating medication without significant adverse effects.  Plan to continue current blood pressure medication regiment.   

## 2023-09-13 ENCOUNTER — Other Ambulatory Visit: Payer: Self-pay

## 2023-09-13 DIAGNOSIS — M47817 Spondylosis without myelopathy or radiculopathy, lumbosacral region: Secondary | ICD-10-CM

## 2023-09-13 DIAGNOSIS — M48062 Spinal stenosis, lumbar region with neurogenic claudication: Secondary | ICD-10-CM

## 2023-09-13 DIAGNOSIS — Q762 Congenital spondylolisthesis: Secondary | ICD-10-CM

## 2023-09-14 MED ORDER — HYDROCODONE-ACETAMINOPHEN 7.5-325 MG PO TABS: 1.0000 | ORAL_TABLET | Freq: Three times a day (TID) | ORAL | 0 refills | Status: DC | PRN

## 2023-10-08 ENCOUNTER — Other Ambulatory Visit (HOSPITAL_COMMUNITY): Payer: Self-pay

## 2023-10-15 ENCOUNTER — Other Ambulatory Visit: Payer: Self-pay

## 2023-10-15 DIAGNOSIS — M48062 Spinal stenosis, lumbar region with neurogenic claudication: Secondary | ICD-10-CM

## 2023-10-15 DIAGNOSIS — M47817 Spondylosis without myelopathy or radiculopathy, lumbosacral region: Secondary | ICD-10-CM

## 2023-10-15 DIAGNOSIS — F988 Other specified behavioral and emotional disorders with onset usually occurring in childhood and adolescence: Secondary | ICD-10-CM

## 2023-10-15 DIAGNOSIS — Q762 Congenital spondylolisthesis: Secondary | ICD-10-CM

## 2023-10-15 MED ORDER — METHYLPHENIDATE HCL 10 MG PO TABS: 20.0000 mg | ORAL_TABLET | Freq: Three times a day (TID) | ORAL | 0 refills | Status: DC

## 2023-10-15 MED ORDER — HYDROCODONE-ACETAMINOPHEN 7.5-325 MG PO TABS: 1.0000 | ORAL_TABLET | Freq: Three times a day (TID) | ORAL | 0 refills | Status: DC | PRN

## 2023-10-25 ENCOUNTER — Ambulatory Visit: Payer: Self-pay | Admitting: *Deleted

## 2023-10-25 ENCOUNTER — Encounter: Payer: Self-pay | Admitting: Family Medicine

## 2023-10-25 DIAGNOSIS — E119 Type 2 diabetes mellitus without complications: Secondary | ICD-10-CM | POA: Diagnosis not present

## 2023-10-25 DIAGNOSIS — G720 Drug-induced myopathy: Secondary | ICD-10-CM | POA: Insufficient documentation

## 2023-10-25 DIAGNOSIS — H2513 Age-related nuclear cataract, bilateral: Secondary | ICD-10-CM | POA: Diagnosis not present

## 2023-10-25 DIAGNOSIS — H43813 Vitreous degeneration, bilateral: Secondary | ICD-10-CM | POA: Diagnosis not present

## 2023-10-25 LAB — HM DIABETES EYE EXAM

## 2023-10-25 NOTE — Patient Outreach (Signed)
  Care Coordination   Follow Up Visit Note   10/25/2023 Name: [name] MRN: [id number] DOB: [birth year]  [name] is a 73 y.o. year old female who sees McDiarmid, Krystal BIRCH, MD for primary care. I spoke with  [name] by phone today.  What matters to the patients health and wellness today?  Report health conditions are stable, doing a better job with diet recently.  Working to keep A1C stable.     Goals Addressed             This Visit's Progress    Effective management of chronic medical conditions (HTN/DM)   On track    Care Coordination Interventions: Provided education to patient about basic DM disease process Reviewed medications with patient and discussed importance of medication adherence Counseled on importance of regular laboratory monitoring as prescribed Discussed plans with patient for ongoing care management follow up and provided patient with direct contact information for care management team Provided patient with written educational materials related to hypo and hyperglycemia and importance of correct treatment Evaluation of current treatment plan related to hypertension self management and patient's adherence to plan as established by provider Reviewed medications with patient and discussed importance of compliance Discussed plans with patient for ongoing care management follow up and provided patient with direct contact information for care management team Advised patient, providing education and rationale, to monitor blood pressure daily and record, calling PCP for findings outside established parameters         SDOH assessments and interventions completed:  No     Care Coordination Interventions:  Yes, provided   Interventions Today    Flowsheet Row Most Recent Value  Chronic Disease   Chronic disease during today's visit Diabetes, Hypertension (HTN)  General Interventions   General Interventions Discussed/Reviewed General Interventions  Reviewed, Annual Eye Exam, Annual Foot Exam, Labs, Vaccines, Doctor Visits  Labs Hgb A1c every 3 months  [Most recent decrease to 6.8]  Vaccines Flu  [flu vaccine received this season]  Doctor Visits Discussed/Reviewed Annual Wellness Visits, PCP  [AWV up to date, next one scheduled for May]  PCP/Specialist Visits Compliance with follow-up visit  [recent PCP visit in Nov 2024, aware of need to call to scheduled 3-4 month follow up for March]  Education Interventions   Education Provided Provided Education  Provided Verbal Education On Labs, Eye Care, Foot Care, When to see the doctor, Medication  Labs Reviewed Hgb A1c  [Aware of goal to keep A1C less than 7]       Follow up plan: Follow up call scheduled for 4/7    Encounter Outcome:  Patient Visit Completed   Odella Ku, RN, MSN, CCM [place]  [facility], [facility] RN Care Coordinator Direct Dial: [phone] / Main [phone] Fax [phone] Email: [email or web address]

## 2023-10-25 NOTE — Patient Instructions (Signed)
 Visit Information  Thank you for taking time to visit with me today. Please don't hesitate to contact me if I can be of assistance to you before our next scheduled telephone appointment.  Following are the goals we discussed today:  Call PCP office to schedule 3-4 month follow up.  Continue monitoring diet, following diabetic recommendations.   Our next appointment is by telephone on 4/7  Please call the care guide team at [phone] if you need to cancel or reschedule your appointment.   Please call the Suicide and Crisis Lifeline: 988 call the [place]  National Suicide Prevention Lifeline: 608 864 7572 or TTY: 603-456-2580 TTY 986-710-6588) to talk to a trained counselor call 1-800-273-TALK (toll free, 24 hour hotline) call 911 if you are experiencing a Mental Health or Behavioral Health Crisis or need someone to talk to.  Patient verbalizes understanding of instructions and care plan provided today and agrees to view in MyChart. Active MyChart status and patient understanding of how to access instructions and care plan via MyChart confirmed with patient.     The patient has been provided with contact information for the care management team and has been advised to call with any health related questions or concerns.   Odella Ku, RN, MSN, CCM [facility], [facility] RN Care Coordinator Direct Dial: [phone] / Main [phone] Fax [phone] Email: [email or web address]

## 2023-11-15 ENCOUNTER — Other Ambulatory Visit: Payer: Self-pay

## 2023-11-15 DIAGNOSIS — Q762 Congenital spondylolisthesis: Secondary | ICD-10-CM

## 2023-11-15 DIAGNOSIS — M48062 Spinal stenosis, lumbar region with neurogenic claudication: Secondary | ICD-10-CM

## 2023-11-15 DIAGNOSIS — M47817 Spondylosis without myelopathy or radiculopathy, lumbosacral region: Secondary | ICD-10-CM

## 2023-11-15 DIAGNOSIS — F988 Other specified behavioral and emotional disorders with onset usually occurring in childhood and adolescence: Secondary | ICD-10-CM

## 2023-11-16 ENCOUNTER — Other Ambulatory Visit: Payer: Self-pay | Admitting: Family Medicine

## 2023-11-16 DIAGNOSIS — E1159 Type 2 diabetes mellitus with other circulatory complications: Secondary | ICD-10-CM

## 2023-11-18 MED ORDER — HYDROCODONE-ACETAMINOPHEN 7.5-325 MG PO TABS: 1.0000 | ORAL_TABLET | Freq: Three times a day (TID) | ORAL | 0 refills | Status: DC | PRN

## 2023-11-18 MED ORDER — METHYLPHENIDATE HCL 10 MG PO TABS: 20.0000 mg | ORAL_TABLET | Freq: Three times a day (TID) | ORAL | 0 refills | Status: DC

## 2023-11-22 ENCOUNTER — Other Ambulatory Visit: Payer: Self-pay | Admitting: Family Medicine

## 2023-11-22 DIAGNOSIS — B3731 Acute candidiasis of vulva and vagina: Secondary | ICD-10-CM

## 2023-12-14 ENCOUNTER — Other Ambulatory Visit: Payer: Self-pay

## 2023-12-14 DIAGNOSIS — F988 Other specified behavioral and emotional disorders with onset usually occurring in childhood and adolescence: Secondary | ICD-10-CM

## 2023-12-14 DIAGNOSIS — M47817 Spondylosis without myelopathy or radiculopathy, lumbosacral region: Secondary | ICD-10-CM

## 2023-12-14 DIAGNOSIS — Q762 Congenital spondylolisthesis: Secondary | ICD-10-CM

## 2023-12-14 DIAGNOSIS — M48062 Spinal stenosis, lumbar region with neurogenic claudication: Secondary | ICD-10-CM

## 2023-12-15 MED ORDER — METHYLPHENIDATE HCL 10 MG PO TABS: 20.0000 mg | ORAL_TABLET | Freq: Three times a day (TID) | ORAL | 0 refills | Status: DC

## 2023-12-15 MED ORDER — HYDROCODONE-ACETAMINOPHEN 7.5-325 MG PO TABS: 1.0000 | ORAL_TABLET | Freq: Three times a day (TID) | ORAL | 0 refills | Status: DC | PRN

## 2024-01-06 ENCOUNTER — Other Ambulatory Visit: Payer: Self-pay

## 2024-01-06 DIAGNOSIS — M48062 Spinal stenosis, lumbar region with neurogenic claudication: Secondary | ICD-10-CM

## 2024-01-06 DIAGNOSIS — F988 Other specified behavioral and emotional disorders with onset usually occurring in childhood and adolescence: Secondary | ICD-10-CM

## 2024-01-06 DIAGNOSIS — Q762 Congenital spondylolisthesis: Secondary | ICD-10-CM

## 2024-01-06 DIAGNOSIS — M47817 Spondylosis without myelopathy or radiculopathy, lumbosacral region: Secondary | ICD-10-CM

## 2024-01-06 NOTE — Telephone Encounter (Signed)
 Patient calls nurse line requesting prior authorizations on (2) medications.   She reports she will need a PA for Hydrocodone and a PA Ritalin.   Advised will need to have PCP send in refills so the PA process can be started.   Will forward to PCP.

## 2024-01-07 MED ORDER — HYDROCODONE-ACETAMINOPHEN 7.5-325 MG PO TABS: 1.0000 | ORAL_TABLET | Freq: Three times a day (TID) | ORAL | 0 refills | Status: DC | PRN

## 2024-01-07 MED ORDER — METHYLPHENIDATE HCL 10 MG PO TABS: 20.0000 mg | ORAL_TABLET | Freq: Three times a day (TID) | ORAL | 0 refills | Status: DC

## 2024-01-20 ENCOUNTER — Ambulatory Visit (INDEPENDENT_AMBULATORY_CARE_PROVIDER_SITE_OTHER): Admitting: Family Medicine

## 2024-01-20 ENCOUNTER — Encounter: Payer: Self-pay | Admitting: Family Medicine

## 2024-01-20 VITALS — BP 120/62 | HR 82 | Ht 62.0 in | Wt 198.1 lb

## 2024-01-20 DIAGNOSIS — E1149 Type 2 diabetes mellitus with other diabetic neurological complication: Secondary | ICD-10-CM

## 2024-01-20 DIAGNOSIS — N182 Chronic kidney disease, stage 2 (mild): Secondary | ICD-10-CM | POA: Diagnosis not present

## 2024-01-20 DIAGNOSIS — Z1231 Encounter for screening mammogram for malignant neoplasm of breast: Secondary | ICD-10-CM | POA: Diagnosis not present

## 2024-01-20 DIAGNOSIS — E1159 Type 2 diabetes mellitus with other circulatory complications: Secondary | ICD-10-CM

## 2024-01-20 DIAGNOSIS — I152 Hypertension secondary to endocrine disorders: Secondary | ICD-10-CM

## 2024-01-20 LAB — POCT GLYCOSYLATED HEMOGLOBIN (HGB A1C): HbA1c, POC (controlled diabetic range): 7.1 % — AB (ref 0.0–7.0)

## 2024-01-20 NOTE — Patient Instructions (Addendum)
 Your A1c is 7.1%.  Looks good!.  I recommend you keep taking the metformin twice a day, like you are.  Your blood pressure is good. Keep taking your blood pressure medications like you are.    Tetanus Boost  It is time for your Tetanus booster.  Tetanus is the infection that causes fatal lockjaw from getting wounds with dirt in them, like stepping on a rusty nail. The vaccination also contains a booster to prevent adults from giving whooping cough (pertussis) to babies.  If you think you will be around babies less than 6 months old in the next 10 years, I would recommend you getting the vaccination.  The vaccination is covered 100% by Medicare.  Please contact your pharmacy to have your tetanus booster vaccination.  You do not need a doctor's prescription for this vaccination.     Shingles Vaccine (Zostavax) What is Shingles (also called Herpes Zoster)? A painful skin rash caused by the same virus that caused your chicken pox when you were a child.  The pain can be severe for some people and can last for 2 to 4 weeks and sometimes much longer.  Why does my doctor want me to get the shingles vaccination? It can prevent shingles in about half of people given the vaccine and in those patients who still get the shingles, the pain is less strong and will not last a long time.   What are the risks form the shingles vaccine? Severe reactions are rare.  Most common reactions are redness, soreness, swelling or itching at the injection site.   How do I get the Shingles vaccine (Zostavax) r.  Medicare part D plans* must cover all Vaccinations recommended by the   CDC to prevent illness, but they may cover only 80 to 100% of the cost.  Contact your pharmacy or insurance company to find out.*  Call your pharmacy to schedule a visit for the vaccination   *Consider the time of year if you may be reaching your Part D plans coverage limit (Donut hole).

## 2024-01-21 NOTE — Assessment & Plan Note (Signed)
 Established problem. Adequate glycemic control.  Pt is tolerating the current medication regiment. Continue current treatment plan.  Patient unable to urinate. Will attempt UACR next office visit RTC 6 months

## 2024-01-21 NOTE — Progress Notes (Signed)
 [name] is accompanied by adolescent granddgt  Sources of clinical information for visit is/are patient. Nursing assessment for this office visit was reviewed with the patient for accuracy and revision.     Previous Report(s) Reviewed: none     09/02/2023    2:52 PM  Depression screen PHQ 2/9  Decreased Interest 0  Down, Depressed, Hopeless 0  PHQ - 2 Score 0  Altered sleeping 0  Tired, decreased energy 0  Change in appetite 0  Feeling bad or failure about yourself  0  Trouble concentrating 0  Moving slowly or fidgety/restless 0  Suicidal thoughts 0  PHQ-9 Score 0   Flowsheet Row Office Visit from 09/02/2023 in [facility] - A Dept Of [place]. [facility] Office Visit from 05/20/2023 in [facility] - A Dept Of [place]. [facility] Office Visit from 01/14/2023 in [facility] - A Dept Of [place]igha [facility]ideg[facility]. [facility]  Thoughts that you would be better off dead, or of hurting yourself in some way Not at all Not at all Not at all  PHQ-9 Total Score 0 1 0          01/20/2024    9:40 AM 09/02/2023    2:52 PM 05/20/2023   10:33 AM 02/18/2023    3:01 PM 12/01/2022    1:32 PM  Fall Risk   Falls in the past year? 0 0 0 0 0  Number falls in past yr: 0 0 0 0 0  Injury with Fall? 0 0 0 0 0  Risk for fall due to :    No Fall Risks   Follow up    Falls evaluation completed        09/02/2023    2:52 PM 05/20/2023   10:32 AM 02/23/2023   12:37 PM  PHQ9 SCORE ONLY  PHQ-9 Total Score 0 1 0    There are no preventive care reminders to display for this patient.  Health Maintenance Due  Topic Date Due   Zoster Vaccines- Shingrix (1 of 2) 11/06/1968   DTaP/Tdap/Td (2 - Tdap) 01/18/2012   MAMMOGRAM  10/15/2018   Diabetic kidney evaluation - Urine ACR  11/27/2023   FOOT EXAM  11/27/2023   Diabetic kidney evaluation - eGFR measurement  01/21/2024   Medicare Annual Wellness (AWV)  02/18/2024      History/P.E.  limitations: none  There are no preventive care reminders to display for this patient.  Diabetes Health Maintenance Due  Topic Date Due   FOOT EXAM  11/27/2023   HEMOGLOBIN A1C  07/21/2024   OPHTHALMOLOGY EXAM  10/24/2024   LIPID PANEL  11/08/2024    Health Maintenance Due  Topic Date Due   Zoster Vaccines- Shingrix (1 of 2) 11/06/1968   DTaP/Tdap/Td (2 - Tdap) 01/18/2012   MAMMOGRAM  10/15/2018   Diabetic kidney evaluation - Urine ACR  11/27/2023   FOOT EXAM  11/27/2023   Diabetic kidney evaluation - eGFR measurement  01/21/2024   Medicare Annual Wellness (AWV)  02/18/2024     Chief Complaint  Patient presents with   Diabetes     --------------------------------------------------------------------------------------------------------------------------------------------- Visit Problem List with A/P  Hypertension associated with diabetes (HCC) Established problem. Adequate blood pressure control.  No evidence of new end organ damage.  Tolerating medication without significant adverse effects.  Plan to continue current blood pressure medication regiment.    Type 2 diabetes mellitus with neurological complications (HCC)  Established problem. Adequate glycemic control.  Pt is tolerating the current medication regiment. Continue current treatment plan.  Patient unable to urinate. Will attempt UACR next office visit RTC 6 months

## 2024-01-21 NOTE — Assessment & Plan Note (Signed)
 Established problem. Adequate blood pressure control.  No evidence of new end organ damage.  Tolerating medication without significant adverse effects.  Plan to continue current blood pressure medication regiment.

## 2024-01-24 ENCOUNTER — Ambulatory Visit: Payer: Self-pay | Admitting: *Deleted

## 2024-01-24 ENCOUNTER — Other Ambulatory Visit: Payer: Self-pay | Admitting: Family Medicine

## 2024-01-24 DIAGNOSIS — M5416 Radiculopathy, lumbar region: Secondary | ICD-10-CM

## 2024-01-24 NOTE — Patient Outreach (Signed)
 Complex Care Management   Visit Note  01/24/2024  Name:  [name] MRN: [id number] DOB: Sep 29, 1951  Situation: Referral received for Complex Care Management related to Diabetes with Complications I obtained verbal consent from Patient.  Visit completed with Patient  on the phone  Background:   Past Medical History:  Diagnosis Date   Acute MEE (middle ear effusion) 06/07/2014   Acute recurrent sinusitis 12/21/2014   Acute sinusitis 12/21/2014   Allergy    Anemia    hx   Asthma, intermittent 11/02/2018   ATTENTION DEFICIT, W/O HYPERACTIVITY 12/16/2006   Bilateral carpal tunnel syndrome 11/20/2016   Cataract    Cervical spondylosis with myelopathy, History of 04/28/2012   COLON POLYP 12/16/2006   (02/03/12) Surgical [Pathology], sigmoid colon, polyp HYPERPLASTIC POLYP(S) AND POLYPOID FRAGMETNS OF BENIGN COLONIC MUCOSA WITH INTRAMUCOSAL LYMPHOID AGGREGATES. NO ADENOMATOUS CHANGE OR MALIGNANCY IDENTIFIED.    DIABETES MELLITUS II, UNCOMPLICATED 12/16/2006   DIFFUSE IDIOPATHIC SKELETAL HYPEROSTOSIS 03/29/2008   Fall 10/18/2020   GERD (gastroesophageal reflux disease)    H/O abnormal mammogram 07/19/2009   S/P Breast Biopsy [facility], [place], 07/2009): Benign findings.     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   HEMORRHOIDS, NOS 12/16/2006   Qualifier: History of  By: McDiarmid MD, Tawanna Cooler     History of blood transfusion 10/2004   with knee replacement   History of peptic ulcer 12/16/2006   UGI series PUD - 10/19/1998     History of right greater trochanteric bursitis 11/07/2009   HYPERCHOLESTEROLEMIA 02/28/2007   HYPERTENSION, BENIGN SYSTEMIC 12/16/2006   Hyponatremia 08/30/2015   INCONTINENCE, URGE 12/16/2006   Qualifier: History of  By: McDiarmid MD, Todd     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/20/2016   OSTEOARTHRITIS, MULTI SITES 12/16/2006   Perennial allergic rhinitis with seasonal variation 12/16/2006        PONV (postoperative nausea and vomiting)     last surgery only   Recurrent maxillary sinusitis    SACROILIITIS, HISTORY OF 01/08/2009   Qualifier: History of  By: McDiarmid MD, Todd     Seasonal allergies    Seborrheic keratosis 01/15/2016   Ulcer    Uterine fibroid 02/02/2011   TVUS: 4cm fibroid w/ submucosal component, bil.hydrosalpinges, unable measurable endometrium - 08/18/2004     Assessment: Patient Reported Symptoms:  Unable to complete systems assessment, patient did not have time today.   Cognitive Able to follow simple commands, Alert and oriented to person, place, and time  Neurological Not assessed    HEENT Not assessed    Cardiovascular Not assessed    Respiratory Not assesed    Endocrine No symptoms reported    Gastrointestinal Not assessed    Genitourinary Not assessed    Integumentary Not assessed    Musculoskeletal Not assessed    Psychosocial Not assessed     Vitals:   01/24/24 1105  BP: 136/68    Medications Reviewed Today     Reviewed by Rodney Langton, RN (Registered Nurse) on 01/24/24 at 1113  Med List Status: <None>   Medication Order Taking? Sig Documenting Provider Last Dose Status Informant  amLODipine-benazepril (LOTREL) 10-20 MG capsule [id number] Yes Take 1 capsule by mouth daily. McDiarmid, Leighton Roach, MD Taking Active   atorvastatin (LIPITOR) 40 MG tablet [id number] Yes Take 1 tablet (40 mg total) by mouth daily. McDiarmid, Leighton Roach, MD Taking Active            Med Note Almetta Lovely   Tue Dec 01, 2022  1:05 PM) Taking every few days.  blood glucose meter kit and supplies KIT [id number] Yes Dispense based on patient and insurance preference. Use up to four times daily as directed. Levin Erp, MD Taking Active   Blood Glucose Monitoring Suppl DEVI [id number] Yes 1 each by Does not apply route in the morning, at noon, and at bedtime. May substitute to any manufacturer covered by patient's insurance. Levin Erp, MD Taking Active   carvedilol (COREG) 6.25 MG tablet [id number]  Yes TAKE 1 TABLET(6.25 MG) BY MOUTH TWICE DAILY WITH A MEAL McDiarmid, Leighton Roach, MD Taking Active   fluconazole (DIFLUCAN) 150 MG tablet [id number] Yes TAKE 1 TABLET BY MOUTH, THEN AGAIN IN 3 DAYS McDiarmid, Leighton Roach, MD Taking Active   furosemide (LASIX) 20 MG tablet [id number] Yes Take 1 tablet (20 mg total) by mouth every other day. McDiarmid, Leighton Roach, MD Taking Active   gabapentin (NEURONTIN) 100 MG capsule [id number] Yes TAKE 1 CAPSULE(100 MG) BY MOUTH THREE TIMES DAILY AS NEEDED McDiarmid, Leighton Roach, MD Taking Active            Med Note Doren Custard Nov 12, 2022  4:37 PM) Takes 2X or 1X usually.  hydrochlorothiazide (HYDRODIURIL) 25 MG tablet [id number] Yes Take 1 tablet (25 mg total) by mouth daily. McDiarmid, Leighton Roach, MD Taking Active   HYDROcodone-acetaminophen [facility]) 7.5-325 MG tablet [id number] Yes Take 1 tablet by mouth every 8 (eight) hours as needed for moderate pain (pain score 4-6). McDiarmid, Leighton Roach, MD Taking Active   metFORMIN (GLUCOPHAGE-XR) 500 MG 24 hr tablet [id number] Yes TAKE 1 TABLET(500 MG) BY MOUTH TWICE DAILY WITH A MEAL McDiarmid, Leighton Roach, MD Taking Active   methylphenidate (RITALIN) 10 MG tablet [id number] Yes Take 2 tablets (20 mg total) by mouth 3 (three) times daily with meals. McDiarmid, Leighton Roach, MD Taking Active   naproxen (NAPROSYN) 500 MG tablet [id number] Yes TAKE 1 TABLET(500 MG) BY MOUTH TWICE DAILY WITH A MEAL McDiarmid, Leighton Roach, MD Taking Active   OVER THE COUNTER MEDICATION [id number] Yes Store brand Ducolax stool softener. One capsule daily. [provider] Taking Active   vitamin B-12 (CYANOCOBALAMIN) 1000 MCG tablet [id number] Yes Take 1 tablet (1,000 mcg total) by mouth daily. McDiarmid, Leighton Roach, MD Taking Active             Recommendation:   PCP Follow-up call to schedule PCP follow up  Follow Up Plan:   Telephone follow-up 2 weeks  Rodney Langton, RN, MSN, CCM [facility], [facility] Health RN Care  Coordinator Direct Dial: [phone] / Main [phone] Fax [phone] Email: [email or web address]: [email or web address]

## 2024-01-24 NOTE — Patient Instructions (Signed)
 Visit Information  Thank you for taking time to visit with me today. Please don't hesitate to contact me if I can be of assistance to you before our next scheduled appointment.  Our next appointment is by telephone on 4/16 at 1pm Please call the care guide team at [phone] if you need to cancel or reschedule your appointment.   Following is a copy of your care plan:   Goals Addressed             This Visit's Progress    COMPLETED: Effective management of chronic medical conditions (HTN/DM)       Care Coordination Interventions: Provided education to patient about basic DM disease process Reviewed medications with patient and discussed importance of medication adherence Counseled on importance of regular laboratory monitoring as prescribed Discussed plans with patient for ongoing care management follow up and provided patient with direct contact information for care management team Provided patient with written educational materials related to hypo and hyperglycemia and importance of correct treatment Evaluation of current treatment plan related to hypertension self management and patient's adherence to plan as established by provider Reviewed medications with patient and discussed importance of compliance Discussed plans with patient for ongoing care management follow up and provided patient with direct contact information for care management team Advised patient, providing education and rationale, to monitor blood pressure daily and record, calling PCP for findings outside established parameters      VBCI RN Care Plan       Problems:  Chronic Disease Management support and education needs related to DMII  Goal: Over the next 3 months the Patient will attend all scheduled medical appointments: AWV on 5/12 as evidenced by completed visit in EMR        continue to work with RN Care Manager and/or Social Worker to address care management and care coordination needs related to DMII  as evidenced by adherence to CM Team Scheduled appointments     take all medications exactly as prescribed and will call provider for medication related questions as evidenced by Patient reported adherence     Interventions:   Diabetes Interventions:  (Status:  New goal.) Long Term Goal Assessed patient's understanding of A1c goal: <7% Provided education to patient about basic DM disease process Counseled on importance of regular laboratory monitoring as prescribed Provided patient with written educational materials related to hypo and hyperglycemia and importance of correct treatment Review of patient status, including review of consultants reports, relevant laboratory and other test results, and medications completed Lab Results  Component Value Date   HGBA1C 7.1 (A) 01/20/2024    Patient Self-Care Activities:  Attend all scheduled provider appointments Call provider office for new concerns or questions  Notify RN Care Manager of [facility] call rescheduling needs Take medications as prescribed   check feet daily for cuts, sores or redness keep a food diary  Plan:  Next AWV (Annual Wellness Visit) scheduled for: The patient has been provided with contact information for the care management team and has been advised to call with any health related questions or concerns.              Please call the Suicide and Crisis Lifeline: 988 call the [place] National Suicide Prevention Lifeline: 657-110-8788 or TTY: 859-853-5050 TTY (407)063-9957) to talk to a trained counselor call 1-800-273-TALK (toll free, 24 hour hotline) go to [facility] [address], [place] [phone]) call 911 if you are experiencing a Mental Health or Behavioral  Health Crisis or need someone to talk to.  Patient verbalizes understanding of instructions and care plan provided today and agrees to view in MyChart. Active MyChart status and patient understanding of how to  access instructions and care plan via MyChart confirmed with patient.     Rodney Langton, RN, MSN, CCM [facility], [facility] Health RN Care Coordinator Direct Dial: [phone] / Main [phone] Fax [phone] Email: [email or web address]: [email or web address]

## 2024-01-27 ENCOUNTER — Other Ambulatory Visit: Payer: Self-pay | Admitting: Family Medicine

## 2024-01-27 DIAGNOSIS — E1159 Type 2 diabetes mellitus with other circulatory complications: Secondary | ICD-10-CM

## 2024-02-02 ENCOUNTER — Other Ambulatory Visit: Payer: Self-pay

## 2024-02-02 ENCOUNTER — Other Ambulatory Visit: Payer: Self-pay | Admitting: *Deleted

## 2024-02-02 NOTE — Patient Instructions (Signed)
 Visit Information  Thank you for taking time to visit with me today. Please don't hesitate to contact me if I can be of assistance to you before our next scheduled appointment.  Telephone follow up appointment date/time:  5/14 at 3pm  Please call the care guide team at [phone] if you need to cancel, schedule, or reschedule an appointment.   Please call the Suicide and Crisis Lifeline: 988 call the [place]  National Suicide Prevention Lifeline: 623-360-8098 or TTY: (514)314-8412 TTY (512) 323-8982) to talk to a trained counselor call 1-800-273-TALK (toll free, 24 hour hotline) call 911 if you are experiencing a Mental Health or Behavioral Health Crisis or need someone to talk to.  Holland Lundborg, RN, MSN, CCM [facility], [facility] Health RN Care Coordinator Direct Dial: [phone] / Main [phone] Fax [phone] Email: [email or web address] Website: [email or web address]

## 2024-02-02 NOTE — Patient Outreach (Signed)
 Complex Care Management   Visit Note  02/02/2024  Name:  [name] MRN: [id number] DOB: [birth year]  Situation: Referral received for Complex Care Management related to Diabetes with Complications I obtained verbal consent from Patient.  Visit completed with patient  on the phone  Patient report she is doing well, due for refills for some medications but state she has been having difficulty securing refills.  State she was told this was due to insurance, but she denies changing payor.  Has all meds at this time, but  running low.  Denies any urgent concerns, encouraged to contact this care manager with questions.   Background:   Past Medical History:  Diagnosis Date   Acute MEE (middle ear effusion) 06/07/2014   Acute recurrent sinusitis 12/21/2014   Acute sinusitis 12/21/2014   Allergy    Anemia    hx   Asthma, intermittent 11/02/2018   ATTENTION DEFICIT, W/O HYPERACTIVITY 12/16/2006   Bilateral carpal tunnel syndrome 11/20/2016   Cataract    Cervical spondylosis with myelopathy, History of 04/28/2012   COLON POLYP 12/16/2006   (02/03/12) Surgical [Pathology], sigmoid colon, polyp HYPERPLASTIC POLYP(S) AND POLYPOID FRAGMETNS OF BENIGN COLONIC MUCOSA WITH INTRAMUCOSAL LYMPHOID AGGREGATES. NO ADENOMATOUS CHANGE OR MALIGNANCY IDENTIFIED.    DIABETES MELLITUS II, UNCOMPLICATED 12/16/2006   DIFFUSE IDIOPATHIC SKELETAL HYPEROSTOSIS 03/29/2008   Fall 10/18/2020   GERD (gastroesophageal reflux disease)    H/O abnormal mammogram 07/19/2009   S/P Breast Biopsy [facility], [place], 07/2009): Benign findings.     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   HEMORRHOIDS, NOS 12/16/2006   Qualifier: History of  By: McDiarmid MD, Ena Harries     History of blood transfusion 10/2004   with knee replacement   History of peptic ulcer 12/16/2006   UGI series PUD - 10/19/1998     History of right greater trochanteric bursitis 11/07/2009   HYPERCHOLESTEROLEMIA 02/28/2007   HYPERTENSION, BENIGN  SYSTEMIC 12/16/2006   Hyponatremia 08/30/2015   INCONTINENCE, URGE 12/16/2006   Qualifier: History of  By: McDiarmid MD, Todd     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/20/2016   OSTEOARTHRITIS, MULTI SITES 12/16/2006   Perennial allergic rhinitis with seasonal variation 12/16/2006        PONV (postoperative nausea and vomiting)    last surgery only   Recurrent maxillary sinusitis    SACROILIITIS, HISTORY OF 01/08/2009   Qualifier: History of  By: McDiarmid MD, Todd     Seasonal allergies    Seborrheic keratosis 01/15/2016   Ulcer    Uterine fibroid 02/02/2011   TVUS: 4cm fibroid w/ submucosal component, bil.hydrosalpinges, unable measurable endometrium - 08/18/2004     Assessment: Patient Reported Symptoms:  Cognitive Cognitive Status: Alert and oriented to person, place, and time, Normal speech and language skills Cognitive/Intellectual Conditions Management [RPT]: None reported or documented in medical history or problem list      Neurological      HEENT HEENT Symptoms Reported: No symptoms reported      Cardiovascular Cardiovascular Symptoms Reported: No symptoms reported Does patient have uncontrolled Hypertension?: No Cardiovascular Conditions: High blood cholesterol, Hypertension  Respiratory Respiratory Symptoms Reported: No symptoms reported    Endocrine Patient reports the following symptoms related to hypoglycemia or hyperglycemia : No symptoms reported Is patient diabetic?: Yes Is patient checking blood sugars at home?: No (Does not monitor due to pain from neuropathy, MD aware) Endocrine Conditions: Diabetes Endocrine Management Strategies: Diet modification, Medication therapy, Routine screening Endocrine Self-Management Outcome: 4 (good)  Gastrointestinal  Gastrointestinal Symptoms Reported: No symptoms reported   Nutrition Risk Screen (CP): No indicators present  Genitourinary      Integumentary Integumentary Symptoms Reported: No symptoms reported     Musculoskeletal Musculoskelatal Symptoms Reviewed: No symptoms reported Additional Musculoskeletal Details: Has chronic back pain Musculoskeletal Conditions: Back pain Musculoskeletal Management Strategies: Medication therapy Musculoskeletal Self-Management Outcome: 4 (good) Falls in the past year?: No Number of falls in past year: 1 or less Was there an injury with Fall?: No Fall Risk Category Calculator: 0 Patient Fall Risk Level: Low Fall Risk Fall risk Follow up: Falls evaluation completed  Psychosocial Psychosocial Symptoms Reported: No symptoms reported   Major Change/Loss/Stressor/Fears (CP): Denies        09/02/2023    2:52 PM  Depression screen PHQ 2/9  Decreased Interest 0  Down, Depressed, Hopeless 0  PHQ - 2 Score 0  Altered sleeping 0  Tired, decreased energy 0  Change in appetite 0  Feeling bad or failure about yourself  0  Trouble concentrating 0  Moving slowly or fidgety/restless 0  Suicidal thoughts 0  PHQ-9 Score 0    There were no vitals filed for this visit.  Medications Reviewed Today     Reviewed by Holland Lundborg, RN (Registered Nurse) on 02/02/24 at 1313  Med List Status: <None>   Medication Order Taking? Sig Documenting Provider Last Dose Status Informant  amLODipine-benazepril (LOTREL) 10-20 MG capsule [id number] Yes TAKE 1 CAPSULE BY MOUTH DAILY McDiarmid, Demetra Filter, MD Taking Active   atorvastatin (LIPITOR) 40 MG tablet [id number] Yes Take 1 tablet (40 mg total) by mouth daily. McDiarmid, Demetra Filter, MD Taking Active            Med Note [facility], [place]   Tue Dec 01, 2022  1:05 PM) Taking every few days.  blood glucose meter kit and supplies KIT [id number] Yes Dispense based on patient and insurance preference. Use up to four times daily as directed. Genora Kidd, MD Taking Active   Blood Glucose Monitoring Suppl DEVI [id number] Yes 1 each by Does not apply route in the morning, at noon, and at bedtime. May substitute to any manufacturer covered  by patient's insurance. Genora Kidd, MD Taking Active   carvedilol (COREG) 6.25 MG tablet [id number] Yes TAKE 1 TABLET(6.25 MG) BY MOUTH TWICE DAILY WITH A MEAL McDiarmid, Demetra Filter, MD Taking Active   fluconazole (DIFLUCAN) 150 MG tablet [id number] Yes TAKE 1 TABLET BY MOUTH, THEN AGAIN IN 3 DAYS McDiarmid, Demetra Filter, MD Taking Active   furosemide (LASIX) 20 MG tablet [id number] No Take 1 tablet (20 mg total) by mouth every other day.  Patient not taking: Reported on 02/02/2024   McDiarmid, Demetra Filter, MD Not Taking Active   gabapentin (NEURONTIN) 100 MG capsule [id number] Yes TAKE 1 CAPSULE(100 MG) BY MOUTH THREE TIMES DAILY AS NEEDED McDiarmid, Demetra Filter, MD Taking Active   hydrochlorothiazide (HYDRODIURIL) 25 MG tablet [id number] Yes Take 1 tablet (25 mg total) by mouth daily. McDiarmid, Demetra Filter, MD Taking Active   HYDROcodone-acetaminophen [facility]) 7.5-325 MG tablet [id number] Yes Take 1 tablet by mouth every 8 (eight) hours as needed for moderate pain (pain score 4-6). McDiarmid, Demetra Filter, MD Taking Active   metFORMIN (GLUCOPHAGE-XR) 500 MG 24 hr tablet [id number] Yes TAKE 1 TABLET(500 MG) BY MOUTH TWICE DAILY WITH A MEAL McDiarmid, Demetra Filter, MD Taking Active   methylphenidate (RITALIN) 10 MG tablet [id number] Yes Take 2 tablets (20 mg total) by mouth 3 (three) times daily with meals. McDiarmid, Ena Harries  D, MD Taking Active   naproxen (NAPROSYN) 500 MG tablet [id number] Yes TAKE 1 TABLET(500 MG) BY MOUTH TWICE DAILY WITH A MEAL McDiarmid, Demetra Filter, MD Taking Active   OVER THE COUNTER MEDICATION [id number] Yes Store brand Ducolax stool softener. One capsule daily. [provider] Taking Active   vitamin B-12 (CYANOCOBALAMIN) 1000 MCG tablet [id number] Yes Take 1 tablet (1,000 mcg total) by mouth daily. McDiarmid, Demetra Filter, MD Taking Active             Recommendation:   PCP Follow-up  Follow Up Plan:   Telephone follow up appointment date/time:  5/14 at 3pm Will contact pharmacy regarding difficulty reordering  medications  Holland Lundborg, RN, MSN, CCM [facility], [facility] Health RN Care Coordinator Direct Dial: [phone] / Main [phone] Fax [phone] Email: [email or web address]: [email or web address]

## 2024-02-03 ENCOUNTER — Other Ambulatory Visit: Payer: Self-pay | Admitting: Family Medicine

## 2024-02-03 ENCOUNTER — Telehealth: Payer: Self-pay | Admitting: *Deleted

## 2024-02-03 DIAGNOSIS — E1159 Type 2 diabetes mellitus with other circulatory complications: Secondary | ICD-10-CM

## 2024-02-03 NOTE — Patient Outreach (Signed)
 Spoke with [facility] regarding need for Lotrel refill and difficulty obtaining.  Per pharmacy, there was a delay, reason unknown, but is now in the process of being filled.  Call placed to patient, provided update that medication would be ready this after noon.  She verbalized understanding.   Holland Lundborg, RN, MSN, CCM [facility], [facility] Health RN Care Coordinator Direct Dial: [phone] / Main [phone] Fax [phone] Email: [email or web address]: [email or web address]

## 2024-02-09 ENCOUNTER — Ambulatory Visit
Admission: RE | Admit: 2024-02-09 | Discharge: 2024-02-09 | Disposition: A | Discharge status: Home / Self Care | Source: Ambulatory Visit | Attending: Family Medicine | Admitting: Family Medicine

## 2024-02-09 DIAGNOSIS — Z1231 Encounter for screening mammogram for malignant neoplasm of breast: Secondary | ICD-10-CM

## 2024-02-14 ENCOUNTER — Encounter: Payer: Self-pay | Admitting: Family Medicine

## 2024-02-14 DIAGNOSIS — R928 Other abnormal and inconclusive findings on diagnostic imaging of breast: Secondary | ICD-10-CM

## 2024-02-14 HISTORY — DX: Other abnormal and inconclusive findings on diagnostic imaging of breast: R92.8

## 2024-02-15 ENCOUNTER — Other Ambulatory Visit: Payer: Self-pay | Admitting: Family Medicine

## 2024-02-15 DIAGNOSIS — R928 Other abnormal and inconclusive findings on diagnostic imaging of breast: Secondary | ICD-10-CM

## 2024-02-21 ENCOUNTER — Ambulatory Visit
Admission: RE | Admit: 2024-02-21 | Discharge: 2024-02-21 | Disposition: A | Discharge status: Home / Self Care | Source: Ambulatory Visit | Attending: Family Medicine | Admitting: Family Medicine

## 2024-02-21 ENCOUNTER — Other Ambulatory Visit: Payer: Self-pay | Admitting: Family Medicine

## 2024-02-21 DIAGNOSIS — N6320 Unspecified lump in the left breast, unspecified quadrant: Secondary | ICD-10-CM

## 2024-02-21 DIAGNOSIS — R928 Other abnormal and inconclusive findings on diagnostic imaging of breast: Secondary | ICD-10-CM

## 2024-02-21 DIAGNOSIS — R590 Localized enlarged lymph nodes: Secondary | ICD-10-CM | POA: Diagnosis not present

## 2024-02-21 DIAGNOSIS — N6311 Unspecified lump in the right breast, upper outer quadrant: Secondary | ICD-10-CM | POA: Diagnosis not present

## 2024-02-21 DIAGNOSIS — N6310 Unspecified lump in the right breast, unspecified quadrant: Secondary | ICD-10-CM

## 2024-02-21 DIAGNOSIS — N6322 Unspecified lump in the left breast, upper inner quadrant: Secondary | ICD-10-CM | POA: Diagnosis not present

## 2024-02-21 DIAGNOSIS — N6321 Unspecified lump in the left breast, upper outer quadrant: Secondary | ICD-10-CM | POA: Diagnosis not present

## 2024-02-21 DIAGNOSIS — R599 Enlarged lymph nodes, unspecified: Secondary | ICD-10-CM

## 2024-02-23 ENCOUNTER — Other Ambulatory Visit: Payer: Self-pay

## 2024-02-23 DIAGNOSIS — F988 Other specified behavioral and emotional disorders with onset usually occurring in childhood and adolescence: Secondary | ICD-10-CM

## 2024-02-23 DIAGNOSIS — M48062 Spinal stenosis, lumbar region with neurogenic claudication: Secondary | ICD-10-CM

## 2024-02-23 DIAGNOSIS — Q762 Congenital spondylolisthesis: Secondary | ICD-10-CM

## 2024-02-23 DIAGNOSIS — M47817 Spondylosis without myelopathy or radiculopathy, lumbosacral region: Secondary | ICD-10-CM

## 2024-02-23 MED ORDER — METHYLPHENIDATE HCL 10 MG PO TABS: 20.0000 mg | ORAL_TABLET | Freq: Three times a day (TID) | ORAL | 0 refills | Status: DC

## 2024-02-23 MED ORDER — HYDROCODONE-ACETAMINOPHEN 7.5-325 MG PO TABS: 1.0000 | ORAL_TABLET | Freq: Three times a day (TID) | ORAL | 0 refills | Status: DC | PRN

## 2024-02-24 ENCOUNTER — Encounter (HOSPITAL_COMMUNITY): Payer: Self-pay

## 2024-02-25 ENCOUNTER — Ambulatory Visit
Admission: RE | Admit: 2024-02-25 | Discharge: 2024-02-25 | Disposition: A | Discharge status: Home / Self Care | Source: Ambulatory Visit | Attending: Family Medicine | Admitting: Family Medicine

## 2024-02-25 DIAGNOSIS — R2232 Localized swelling, mass and lump, left upper limb: Secondary | ICD-10-CM | POA: Diagnosis not present

## 2024-02-25 DIAGNOSIS — R928 Other abnormal and inconclusive findings on diagnostic imaging of breast: Secondary | ICD-10-CM

## 2024-02-25 DIAGNOSIS — C773 Secondary and unspecified malignant neoplasm of axilla and upper limb lymph nodes: Secondary | ICD-10-CM | POA: Diagnosis not present

## 2024-02-25 DIAGNOSIS — R599 Enlarged lymph nodes, unspecified: Secondary | ICD-10-CM

## 2024-02-25 DIAGNOSIS — N6312 Unspecified lump in the right breast, upper inner quadrant: Secondary | ICD-10-CM | POA: Diagnosis not present

## 2024-02-25 DIAGNOSIS — C50212 Malignant neoplasm of upper-inner quadrant of left female breast: Secondary | ICD-10-CM | POA: Diagnosis not present

## 2024-02-25 DIAGNOSIS — C779 Secondary and unspecified malignant neoplasm of lymph node, unspecified: Secondary | ICD-10-CM | POA: Diagnosis not present

## 2024-02-25 DIAGNOSIS — N6310 Unspecified lump in the right breast, unspecified quadrant: Secondary | ICD-10-CM

## 2024-02-25 DIAGNOSIS — R590 Localized enlarged lymph nodes: Secondary | ICD-10-CM | POA: Diagnosis not present

## 2024-02-25 DIAGNOSIS — N6320 Unspecified lump in the left breast, unspecified quadrant: Secondary | ICD-10-CM

## 2024-02-25 DIAGNOSIS — N6321 Unspecified lump in the left breast, upper outer quadrant: Secondary | ICD-10-CM | POA: Diagnosis not present

## 2024-02-25 DIAGNOSIS — N6322 Unspecified lump in the left breast, upper inner quadrant: Secondary | ICD-10-CM | POA: Diagnosis not present

## 2024-02-25 HISTORY — PX: BREAST BIOPSY: SHX20

## 2024-02-28 ENCOUNTER — Ambulatory Visit (INDEPENDENT_AMBULATORY_CARE_PROVIDER_SITE_OTHER): Payer: Medicare Other

## 2024-02-28 VITALS — Ht 62.0 in | Wt 190.0 lb

## 2024-02-28 DIAGNOSIS — Z0000 Encounter for general adult medical examination without abnormal findings: Secondary | ICD-10-CM | POA: Diagnosis not present

## 2024-02-28 LAB — SURGICAL PATHOLOGY

## 2024-02-28 NOTE — Progress Notes (Signed)
 Because this visit was a virtual/telehealth visit,  certain criteria was not obtained, such a blood pressure, CBG if applicable, and timed get up and go. Any medications not marked as "taking" were not mentioned during the medication reconciliation part of the visit. Any vitals not documented were not able to be obtained due to this being a telehealth visit or patient was unable to self-report a recent blood pressure reading due to a lack of equipment at home via telehealth. Vitals that have been documented are verbally provided by the patient.   Subjective:   [name] is a 73 y.o. who presents for a Medicare Wellness preventive visit.  As a reminder, Annual Wellness Visits don't include a physical exam, and some assessments may be limited, especially if this visit is performed virtually. We may recommend an in-person visit if needed.  Visit Complete: Virtual I connected with  [name] on 02/28/24 by a audio enabled telemedicine application and verified that I am speaking with the correct person using two identifiers.  Patient Location: Home  Provider Location: Office/Clinic  I discussed the limitations of evaluation and management by telemedicine. The patient expressed understanding and agreed to proceed.  Vital Signs: Because this visit was a virtual/telehealth visit, some criteria may be missing or patient reported. Any vitals not documented were not able to be obtained and vitals that have been documented are patient reported.  VideoDeclined- This patient declined Librarian, academic. Therefore the visit was completed with audio only.  Persons Participating in Visit: Patient.  AWV Questionnaire: No: Patient Medicare AWV questionnaire was not completed prior to this visit.  Cardiac Risk Factors include: advanced age (>66men, >65 women);diabetes mellitus;dyslipidemia;family history of premature cardiovascular disease;hypertension;obesity (BMI  >30kg/m2);sedentary lifestyle     Objective:     Today's Vitals   02/28/24 1422  Weight: 190 lb (86.2 kg)  Height: 5\' 2"  (1.575 m)  PainSc: 0-No pain   Body mass index is 34.75 kg/m.     02/28/2024    2:24 PM 02/02/2024    2:55 PM 01/20/2024    9:39 AM 09/02/2023    2:51 PM 05/20/2023   10:32 AM 01/14/2023   10:49 AM 12/10/2022   11:07 AM  Advanced Directives  Does Patient Have a Medical Advance Directive? No No No No No No No  Would patient like information on creating a medical advance directive? No - Patient declined No - Patient declined No - Patient declined No - Patient declined No - Patient declined No - Patient declined No - Patient declined    Current Medications (verified) Outpatient Encounter Medications as of 02/28/2024  Medication Sig   amLODipine -benazepril  (LOTREL) 10-20 MG capsule TAKE 1 CAPSULE BY MOUTH DAILY   atorvastatin  (LIPITOR) 40 MG tablet Take 1 tablet (40 mg total) by mouth daily.   blood glucose meter kit and supplies KIT Dispense based on patient and insurance preference. Use up to four times daily as directed.   Blood Glucose Monitoring Suppl DEVI 1 each by Does not apply route in the morning, at noon, and at bedtime. May substitute to any manufacturer covered by patient's insurance.   carvedilol  (COREG ) 6.25 MG tablet TAKE 1 TABLET(6.25 MG) BY MOUTH TWICE DAILY WITH A MEAL   fluconazole  (DIFLUCAN ) 150 MG tablet TAKE 1 TABLET BY MOUTH, THEN AGAIN IN 3 DAYS   furosemide  (LASIX ) 20 MG tablet Take 1 tablet (20 mg total) by mouth every other day. (Patient not taking: Reported on 02/02/2024)  gabapentin  (NEURONTIN ) 100 MG capsule TAKE 1 CAPSULE(100 MG) BY MOUTH THREE TIMES DAILY AS NEEDED   hydrochlorothiazide  (HYDRODIURIL ) 25 MG tablet TAKE 1 TABLET(25 MG) BY MOUTH DAILY   HYDROcodone -acetaminophen  (NORCO) 7.5-325 MG tablet Take 1 tablet by mouth every 8 (eight) hours as needed for moderate pain (pain score 4-6).   metFORMIN  (GLUCOPHAGE -XR) 500 MG 24 hr  tablet TAKE 1 TABLET(500 MG) BY MOUTH TWICE DAILY WITH A MEAL   methylphenidate  (RITALIN ) 10 MG tablet Take 2 tablets (20 mg total) by mouth 3 (three) times daily with meals.   naproxen  (NAPROSYN ) 500 MG tablet TAKE 1 TABLET(500 MG) BY MOUTH TWICE DAILY WITH A MEAL   OVER THE COUNTER MEDICATION Store brand Ducolax stool softener. One capsule daily.   vitamin B-12 (CYANOCOBALAMIN) 1000 MCG tablet Take 1 tablet (1,000 mcg total) by mouth daily.   No facility-administered encounter medications on file as of 02/28/2024.    Allergies (verified) Januvia  [sitagliptin ], Jardiance  [empagliflozin ], Amlodipine , Celecoxib, and Diclofenac-misoprostol   History: Past Medical History:  Diagnosis Date   Acute MEE (middle ear effusion) 06/07/2014   Acute recurrent sinusitis 12/21/2014   Acute sinusitis 12/21/2014   Allergy    Anemia    hx   Asthma, intermittent 11/02/2018   ATTENTION DEFICIT, W/O HYPERACTIVITY 12/16/2006   Bilateral carpal tunnel syndrome 11/20/2016   Cataract    Cervical spondylosis with myelopathy, History of 04/28/2012   COLON POLYP 12/16/2006   (02/03/12) Surgical [Pathology], sigmoid colon, polyp HYPERPLASTIC POLYP(S) AND POLYPOID FRAGMETNS OF BENIGN COLONIC MUCOSA WITH INTRAMUCOSAL LYMPHOID AGGREGATES. NO ADENOMATOUS CHANGE OR MALIGNANCY IDENTIFIED.    DIABETES MELLITUS II, UNCOMPLICATED 12/16/2006   Fall 10/18/2020   GERD (gastroesophageal reflux disease)    H/O abnormal mammogram 07/19/2009   S/P Breast Biopsy [facility], [place], 07/2009): Benign findings.     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/20/2016   OSTEOARTHRITIS, MULTI SITES 12/16/2006   Perennial allergic rhinitis with seasonal variation 12/16/2006        PONV (postoperative nausea and vomiting)    last surgery only   Recurrent maxillary sinusitis    SACROILIITIS, HISTORY OF 01/08/2009   Qualifier: History of  By: McDiarmid MD, Todd     Seasonal allergies    Seborrheic keratosis 01/15/2016   SKELETAL HYPEROSTOSIS 03/29/2008   Ulcer    Uterine fibroid 02/02/2011   TVUS: 4cm fibroid w/ submucosal component, bil.hydrosalpinges, unable measurable endometrium - 08/18/2004    Past Surgical History:  Procedure Laterality Date   ANTERIOR CERVICAL DECOMP/DISCECTOMY FUSION  04/28/2012   Procedure: ANTERIOR CERVICAL DECOMPRESSION/DISCECTOMY FUSION 3 LEVELS;  Surgeon: Elder Greening, MD;  Location: [facility];  Service: Neurosurgery;  Laterality: N/A;  Cervical three-four,Cervical four-five,Cervical five-six anterior cervical decompression with fusion interbody prothesis plating and bonegraft   BREAST BIOPSY  07/19/2009   S/P Breast Biopsy [facility], [place], 07/2009): Benign findings.  (10/27/2010)  BREAST BIOPSY Left 02/25/2024   US  LT BREAST BX W LOC DEV 1ST LESION IMG BX SPEC US  GUIDE 02/25/2024 [facility]   BREAST BIOPSY Left 02/25/2024   US  LT BREAST BX W LOC DEV EA ADD LESION IMG BX SPEC US  GUIDE 02/25/2024 [facility] M[facility]   BREAST LUMPECTOMY Left    CARPAL TUNNEL RELEASE Left 12/10/2016   Procedure: LEFT CARPAL TUNNEL RELEASE;  Surgeon: Lyanne Sample, MD;  Location: [facility];  Service: Orthopedics;  Laterality: Left;   COLONOSCOPY W/ POLYPECTOMY  12/17/2000   colonoscopy (Dr Tova Fresh) int. hemorrhoids &  nonneoplatic colon polyps - 01/10/2001,    COLONOSCOPY W/ POLYPECTOMY  01/18/2012   Dr Bridgett Camps  (GI).sigmoid colon, hyperlastic polyp   ENDOMETRIAL BIOPSY  08/19/2004   Endometrial Biopsy - 09/01/2004,   NM MYOVIEW  LTD  04/18/2004   Cardiolite:EF63%, no ischemia - 05/01/2004,    REPLACEMENT TOTAL KNEE BILATERAL     Family History  Problem Relation Age of Onset   Hypertension Mother    Arthritis Mother    Cancer Sister 20       Breast   Cancer Sister 33       Breast   Arthritis Father    Diabetes Father    Heart attack Sister    Drug abuse Sister    Colon cancer Neg Hx    Esophageal cancer Neg Hx    Rectal cancer Neg Hx    Stomach cancer Neg Hx    Social History   Socioeconomic History   Marital status: Married    Spouse name: Not on file   Number of children: 1   Years of education: Not on file   Highest education level: Not on file  Occupational History   Occupation: Dentist: RETIRED  Tobacco Use   Smoking status: Former    Current packs/day: 0.00    Average packs/day: 1 pack/day for 4.0 years (4.0 ttl pk-yrs)    Types: Cigarettes    Start date: 10/20/1975    Quit date: 10/20/1979    Years since quitting: 44.3   Smokeless tobacco: Never  Vaping Use   Vaping status: Never Used  Substance and Sexual Activity   Alcohol use: No   Drug use: No   Sexual activity: Yes  Other Topics Concern   Not on file  Social History Narrative   Lives with husband one adult son with autism and a granddgt, no etoh, Mother of 14 adopted children, many with special needs .Regular exercise-no   Social Drivers of Health   Financial Resource Strain: Low Risk  (02/28/2024)   Overall Financial Resource Strain (CARDIA)    Difficulty of Paying Living Expenses: Not hard at all  Food Insecurity: No Food Insecurity (02/28/2024)   Hunger Vital Sign    Worried About Running Out of Food in the Last Year: Never true    Ran Out of Food in the Last Year: Never true  Transportation Needs: No Transportation Needs (02/28/2024)   PRAPARE - Administrator, Civil Service  (Medical): No    Lack of Transportation (Non-Medical): No  Physical Activity: Insufficiently Active (02/28/2024)   Exercise Vital Sign    Days of Exercise per Week: 2 days    Minutes of Exercise per Session: 20 min  Stress: No Stress Concern Present (02/28/2024)   [employer] of Occupational Health - Occupational Stress Questionnaire    Feeling of Stress : Not at all  Social Connections: Socially Integrated (02/28/2024)  Social Advertising account executive [NHANES]    Frequency of Communication with Friends and Family: More than three times a week    Frequency of Social Gatherings with Friends and Family: Twice a week    Attends Religious Services: 1 to 4 times per year    Active Member of [employer] or Organizations: Yes    Attends Banker Meetings: Never    Marital Status: Married    Tobacco Counseling Counseling given: Not Answered    Clinical Intake:  Pre-visit preparation completed: Yes  Pain : No/denies pain Pain Score: 0-No pain     BMI - recorded: 34.75 Nutritional Status: BMI > 30  Obese Nutritional Risks: None Diabetes: Yes CBG done?: No Did pt. bring in CBG monitor from home?: No  Lab Results  Component Value Date   HGBA1C 7.1 (A) 01/20/2024   HGBA1C 6.8 09/02/2023   HGBA1C 7.2 (A) 05/20/2023     How often do you need to have someone help you when you read instructions, pamphlets, or other written materials from your doctor or pharmacy?: 1 - Never  Interpreter Needed?: No  Information entered by :: Phyllis Whitefield N. Maudry Zeidan, LPN.   Activities of Daily Living     02/28/2024    2:26 PM  In your present state of health, do you have any difficulty performing the following activities:  Hearing? 0  Vision? 0  Difficulty concentrating or making decisions? 0  Walking or climbing stairs? 0  Dressing or bathing? 0  Doing errands, shopping? 0  Preparing Food and eating ? N  Using the Toilet? N  In the past six months, have you accidently  leaked urine? Y  Comment wears protection  Do you have problems with loss of bowel control? N  Managing your Medications? N  Managing your Finances? N  Housekeeping or managing your Housekeeping? Y    Patient Care Team: McDiarmid, Demetra Filter, MD as PCP - General Rudine Cos, MD as Consulting Physician (Ophthalmology) Garry Kansas, MD as Consulting Physician (Neurosurgery) Clair Crews, MD as Consulting Physician (Ophthalmology) Holland Lundborg, RN as [facility] Care Management  Indicate any recent Medical Services you may have received from other than [facility] providers in the past year (date may be approximate).     Assessment:    This is a routine wellness examination for [place].  Hearing/Vision screen Hearing Screening - Comments:: Denies hearing difficulties.  Vision Screening - Comments:: Wears reading and prescription eyeglasses - up to date with routine eye exams with Ramiro Burly, MD.    Goals Addressed             This Visit's Progress    Client understands the importance of follow-up with providers by attending scheduled visits.         Depression Screen     02/28/2024    2:28 PM 09/02/2023    2:52 PM 05/20/2023   10:32 AM 02/23/2023   12:37 PM 02/18/2023    3:01 PM 01/14/2023   10:49 AM 12/10/2022   11:07 AM  PHQ 2/9 Scores  PHQ - 2 Score 0 0 0 0 0 0 0  PHQ- 9 Score 0 0 1   0 0    Fall Risk     02/28/2024    2:26 PM 02/02/2024    2:25 PM 01/20/2024    9:40 AM 09/02/2023    2:52 PM 05/20/2023   10:33 AM  Fall Risk   Falls in the past year? 0 0 0 0  0  Number falls in past yr: 0 0 0 0 0  Injury with Fall? 0 0 0 0 0  Risk for fall due to : No Fall Risks      Follow up Falls prevention discussed;Falls evaluation completed Falls evaluation completed       MEDICARE RISK AT HOME:  Medicare Risk at Home Any stairs in or around the home?: Yes If so, are there any without handrails?: No Home free of loose throw rugs in walkways, pet  beds, electrical cords, etc?: Yes Adequate lighting in your home to reduce risk of falls?: Yes Life alert?: No Use of a cane, walker or w/c?: Yes Grab bars in the bathroom?: Yes Shower chair or bench in shower?: Yes Elevated toilet seat or a handicapped toilet?: Yes  TIMED UP AND GO:  Was the test performed?  No  Cognitive Function: 6CIT completed    02/28/2024    2:28 PM  MMSE - Mini Mental State Exam  Not completed: Unable to complete        02/28/2024    2:26 PM 02/18/2023    3:06 PM  6CIT Screen  What Year? 0 points 0 points  What month? 0 points 0 points  What time? 0 points 0 points  Count back from 20 0 points 0 points  Months in reverse 0 points 0 points  Repeat phrase 0 points 0 points  Total Score 0 points 0 points    Immunizations Immunization History  Administered Date(s) Administered   Fluad Quad(high Dose 65+) 07/05/2019, 07/19/2020, 08/27/2022   Fluad Trivalent(High Dose 65+) 08/18/2023   Influenza Split 08/08/2012   Influenza Whole 07/19/2006, 09/14/2007, 07/27/2008, 08/12/2010   Influenza,inj,Quad PF,6+ Mos 07/31/2013, 08/10/2014, 08/13/2015, 07/30/2016, 07/27/2017, 06/23/2018, 08/04/2021   Moderna Covid-19 Fall Seasonal Vaccine 43yrs & older 07/31/2023   Moderna Covid-19 Seasonal Vaccine 6 months thru 74years of age 63/19/2023   [facility] SARS-COV2 Booster Vaccination 07/30/2021   Moderna Sars-Covid-2 Vaccination 12/01/2019, 12/29/2019, 08/02/2020, 03/12/2021   PFIZER Comirnaty(Gray Top)Covid-19 Tri-Sucrose Vaccine 06/19/2022   Pneumococcal Conjugate-13 01/24/2015   Pneumococcal Polysaccharide-23 10/19/1994, 04/29/2012, 09/16/2017   Td 01/17/2002   Zoster, Live 03/03/2012    Screening Tests Health Maintenance  Topic Date Due   Zoster Vaccines- Shingrix (1 of 2) 11/06/1968   DTaP/Tdap/Td (2 - Tdap) 01/18/2012   Diabetic kidney evaluation - Urine ACR  09/18/2023   FOOT EXAM  11/27/2023   Diabetic kidney evaluation - eGFR measurement  01/21/2024    COVID-19 Vaccine (7 - Moderna risk 2024-25 season) 01/29/2024   INFLUENZA VACCINE  05/19/2024   HEMOGLOBIN A1C  07/21/2024   OPHTHALMOLOGY EXAM  10/24/2024   LIPID PANEL  11/08/2024   Medicare Annual Wellness (AWV)  02/27/2025   MAMMOGRAM  02/20/2026   Colonoscopy  05/26/2027   Pneumonia Vaccine 61+ Years old  Completed   DEXA SCAN  Completed   Hepatitis C Screening  Completed   HPV VACCINES  Aged Out   Meningococcal B Vaccine  Aged Out    Health Maintenance  Health Maintenance Due  Topic Date Due   Zoster Vaccines- Shingrix (1 of 2) 11/06/1968   DTaP/Tdap/Td (2 - Tdap) 01/18/2012   Diabetic kidney evaluation - Urine ACR  09/18/2023   FOOT EXAM  11/27/2023   Diabetic kidney evaluation - eGFR measurement  01/21/2024   COVID-19 Vaccine (7 - Moderna risk 2024-25 season) 01/29/2024   Health Maintenance Items Addressed: Yes Patient is aware of current healthcare gaps.  Additional Screening:  Vision Screening: Recommended annual  ophthalmology exams for early detection of glaucoma and other disorders of the eye.  Dental Screening: Recommended annual dental exams for proper oral hygiene  Community Resource Referral / Chronic Care Management: CRR required this visit?  No   CCM required this visit?  No   Plan:    I have personally reviewed and noted the following in the patient's chart:   Medical and social history Use of alcohol, tobacco or illicit drugs  Current medications and supplements including opioid prescriptions. Patient is currently taking opioid prescriptions. Information provided to patient regarding non-opioid alternatives. Patient advised to discuss non-opioid treatment plan with their provider. Functional ability and status Nutritional status Physical activity Advanced directives List of other physicians Hospitalizations, surgeries, and ER visits in previous 12 months Vitals Screenings to include cognitive, depression, and falls Referrals and  appointments  In addition, I have reviewed and discussed with patient certain preventive protocols, quality metrics, and best practice recommendations. A written personalized care plan for preventive services as well as general preventive health recommendations were provided to patient.   Margette Sheldon, LPN   9/60/4540   After Visit Summary: (MyChart) Due to this being a telephonic visit, the after visit summary with patients personalized plan was offered to patient via MyChart   Nurse Notes: Patient aware of current care gaps. Immunization record was verified by [facility] and updated in patient's chart.

## 2024-02-28 NOTE — Patient Instructions (Signed)
 [name] , Thank you for taking time out of your busy schedule to complete your Annual Wellness Visit with me. I enjoyed our conversation and look forward to speaking with you again next year. I, as well as your care team,  appreciate your ongoing commitment to your health goals. Please review the following plan we discussed and let me know if I can assist you in the future. Your Game plan/ To Do List    Referrals: If you haven't heard from the office you've been referred to, please reach out to them at the phone provided.   Follow up Visits: Next Medicare AWV with our clinical staff: 03/08/25 at 2:10 pm PHONE VISIT   Have you seen your provider in the last 6 months (3 months if uncontrolled diabetes)? Yes Next Office Visit with your provider: Patient aware to schedule in July 2025  Clinician Recommendations:  Aim for 30 minutes of exercise or brisk walking, 6-8 glasses of water, and 5 servings of fruits and vegetables each day.       This is a list of the screening recommended for you and due dates:  Health Maintenance  Topic Date Due   Zoster (Shingles) Vaccine (1 of 2) 11/06/1968   DTaP/Tdap/Td vaccine (2 - Tdap) 01/18/2012   Yearly kidney health urinalysis for diabetes  09/18/2023   Complete foot exam   11/27/2023   Yearly kidney function blood test for diabetes  01/21/2024   COVID-19 Vaccine (7 - Moderna risk 2024-25 season) 01/29/2024   Flu Shot  05/19/2024   Hemoglobin A1C  07/21/2024   Eye exam for diabetics  10/24/2024   Lipid (cholesterol) test  11/08/2024   Medicare Annual Wellness Visit  02/27/2025   Mammogram  02/20/2026   Colon Cancer Screening  05/26/2027   Pneumonia Vaccine  Completed   DEXA scan (bone density measurement)  Completed   Hepatitis C Screening  Completed   HPV Vaccine  Aged Out   Meningitis B Vaccine  Aged Out    Advanced directives: (Declined) Advance directive discussed with you today. Even though you declined this today, please call our office  should you change your mind, and we can give you the proper paperwork for you to fill out. Advance Care Planning is important because it:  [x]  Makes sure you receive the medical care that is consistent with your values, goals, and preferences  [x]  It provides guidance to your family and loved ones and reduces their decisional burden about whether or not they are making the right decisions based on your wishes.  Follow the link provided in your after visit summary or read over the paperwork we have mailed to you to help you started getting your Advance Directives in place. If you need assistance in completing these, please reach out to us  so that we can help you!  See attachments for Preventive Care and Fall Prevention Tips.

## 2024-03-01 ENCOUNTER — Ambulatory Visit: Payer: Self-pay | Admitting: Family Medicine

## 2024-03-01 ENCOUNTER — Other Ambulatory Visit: Payer: Self-pay | Admitting: *Deleted

## 2024-03-01 NOTE — Patient Instructions (Signed)
 Visit Information  Thank you for taking time to visit with me today. Please don't hesitate to contact me if I can be of assistance to you before our next scheduled appointment.  Telephone follow up appointment date/time:  6/2 at 3pm with Kristen H.   Please call the care guide team at [phone] if you need to cancel, schedule, or reschedule an appointment.   Please call the Suicide and Crisis Lifeline: 988 call the [place]  National Suicide Prevention Lifeline: (619)367-3905 or TTY: (562) 772-2086 TTY 727-474-5567) to talk to a trained counselor call 1-800-273-TALK (toll free, 24 hour hotline) call 911 if you are experiencing a Mental Health or Behavioral Health Crisis or need someone to talk to.  Holland Lundborg, RN, MSN, CCM [facility], [facility] RN Care Coordinator Direct Dial: [phone] / Main [phone] Fax [phone] Email: [email or web address]

## 2024-03-01 NOTE — Patient Outreach (Signed)
 Complex Care Management   Visit Note  03/01/2024  Name:  [name] MRN: [id number] DOB: 18-Jun-1951  Situation: Referral received for Complex Care Management related to Diabetes with Complications I obtained verbal consent from Patient.  Visit completed with patient  on the phone  Patient doing well with DM management, still does not monitor daily due to neuropathy.  She has had mammogram since last outreach, results positive for cancer.  She will have biopsy tomorrow.   Background:   Past Medical History:  Diagnosis Date   Acute MEE (middle ear effusion) 06/07/2014   Acute recurrent sinusitis 12/21/2014   Acute sinusitis 12/21/2014   Allergy    Anemia    hx   Asthma, intermittent 11/02/2018   ATTENTION DEFICIT, W/O HYPERACTIVITY 12/16/2006   Bilateral carpal tunnel syndrome 11/20/2016   Cataract    Cervical spondylosis with myelopathy, History of 04/28/2012   COLON POLYP 12/16/2006   (02/03/12) Surgical [Pathology], sigmoid colon, polyp HYPERPLASTIC POLYP(S) AND POLYPOID FRAGMETNS OF BENIGN COLONIC MUCOSA WITH INTRAMUCOSAL LYMPHOID AGGREGATES. NO ADENOMATOUS CHANGE OR MALIGNANCY IDENTIFIED.    DIABETES MELLITUS II, UNCOMPLICATED 12/16/2006   Fall 10/18/2020   GERD (gastroesophageal reflux disease)    H/O abnormal mammogram 07/19/2009   S/P Breast Biopsy [facility], [place], 07/2009): Benign findings.     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   Osteoarthritis  of finger of right hand 11/20/2016   OSTEOARTHRITIS, MULTI SITES 12/16/2006   Perennial allergic rhinitis with seasonal variation 12/16/2006        PONV (postoperative nausea and vomiting)    last surgery only   Recurrent maxillary sinusitis    SACROILIITIS, HISTORY OF 01/08/2009   Qualifier: History of  By: McDiarmid MD, Todd     Seasonal allergies    Seborrheic keratosis 01/15/2016   SKELETAL HYPEROSTOSIS 03/29/2008   Ulcer    Uterine fibroid 02/02/2011   TVUS: 4cm fibroid w/ submucosal component, bil.hydrosalpinges, unable measurable endometrium - 08/18/2004     Assessment: Patient Reported Symptoms:  Cognitive Cognitive Status: Alert and oriented to person, place, and time, Normal speech and language skills, Able to follow simple commands Cognitive/Intellectual Conditions Management [RPT]: None reported or documented in medical history or problem list      Neurological Neurological Review of Symptoms: No symptoms reported    HEENT HEENT Symptoms Reported: No symptoms reported      Cardiovascular Cardiovascular Symptoms Reported: No symptoms reported    Respiratory Respiratory Symptoms Reported: No symptoms reported    Endocrine Patient reports the following symptoms related to hypoglycemia or hyperglycemia : No symptoms reported Is patient diabetic?: Yes Is patient checking blood sugars at home?: No Endocrine Conditions: Diabetes  Gastrointestinal Gastrointestinal Symptoms Reported: No symptoms reported      Genitourinary Genitourinary Symptoms Reported: No symptoms reported    Integumentary Integumentary Symptoms Reported: No symptoms reported    Musculoskeletal Musculoskelatal Symptoms Reviewed: No symptoms reported  Psychosocial Psychosocial Symptoms Reported: No symptoms reported            02/28/2024    2:28 PM  Depression screen PHQ 2/9  Decreased Interest 0  Down, Depressed, Hopeless 0  PHQ - 2 Score 0  Altered sleeping 0  Tired, decreased  energy 0  Change in appetite 0  Feeling bad or failure about yourself  0  Trouble concentrating 0  Moving slowly or fidgety/restless 0  Suicidal thoughts 0  PHQ-9 Score 0  Difficult doing work/chores Not difficult at all    There were no vitals filed for this visit.  Medications Reviewed Today     Reviewed by Holland Lundborg, RN (Registered Nurse) on 03/01/24 at 1615  Med List Status: <None>   Medication Order Taking? Sig Documenting Provider Last Dose Status Informant  amLODipine -benazepril  (LOTREL) 10-20 MG capsule [id number] No TAKE 1 CAPSULE BY MOUTH DAILY McDiarmid, Demetra Filter, MD Taking Active   atorvastatin  (LIPITOR) 40 MG tablet [id number] No Take 1 tablet (40 mg total) by mouth daily. McDiarmid, Demetra Filter, MD Taking Active            Med Note [facility], [place]   Tue Dec 01, 2022  1:05 PM) Taking every few days.  blood glucose meter kit and supplies KIT [id number] No Dispense based on patient and insurance preference. Use up to four times daily as directed. Jagadish, Mayuri, MD Taking Active   Blood Glucose Monitoring Suppl DEVI [id number] No 1 each by Does not apply route in the morning, at noon, and at bedtime. May substitute to any manufacturer covered by patient's insurance. Genora Kidd, MD Taking Active   carvedilol  (COREG ) 6.25 MG tablet [id number] No TAKE 1 TABLET(6.25 MG) BY MOUTH TWICE DAILY WITH A MEAL McDiarmid, Demetra Filter, MD Taking Active   fluconazole  (DIFLUCAN ) 150 MG tablet [id number] No TAKE 1 TABLET BY MOUTH, THEN AGAIN IN 3 DAYS McDiarmid, Demetra Filter, MD Taking Active   furosemide  (LASIX ) 20 MG tablet [id number] No Take 1 tablet (20 mg total) by mouth every other day.  Patient not taking: Reported on 02/02/2024   McDiarmid, Demetra Filter, MD Not Taking Active   gabapentin  (NEURONTIN ) 100 MG capsule [id number] No TAKE 1 CAPSULE(100 MG) BY MOUTH THREE TIMES DAILY AS NEEDED McDiarmid, Demetra Filter, MD Taking Active   hydrochlorothiazide  (HYDRODIURIL ) 25 MG tablet [id number]  TAKE 1 TABLET(25 MG)  BY MOUTH DAILY McDiarmid, Demetra Filter, MD  Active   HYDROcodone -acetaminophen  (NORCO) 7.5-325 MG tablet [id number]  Take 1 tablet by mouth every 8 (eight) hours as needed for moderate pain (pain score 4-6). Candee Cha, MD  Active   metFORMIN  (GLUCOPHAGE -XR) 500 MG 24 hr tablet [id number] No TAKE 1 TABLET(500 MG) BY MOUTH TWICE DAILY WITH A MEAL McDiarmid, Demetra Filter, MD Taking Active   methylphenidate  (RITALIN ) 10 MG tablet [id number]  Take 2 tablets (20 mg total) by mouth 3 (three) times daily with meals. Candee Cha, MD  Active   naproxen  (NAPROSYN ) 500 MG tablet [id number] No TAKE 1 TABLET(500 MG) BY MOUTH TWICE DAILY WITH A MEAL McDiarmid, Demetra Filter, MD Taking Active   OVER THE COUNTER MEDICATION [id number] No Store brand Ducolax stool softener. One capsule daily. [provider] Taking Active   vitamin B-12 (CYANOCOBALAMIN) 1000 MCG tablet [id number] No Take 1 tablet (1,000 mcg total) by mouth daily. McDiarmid, Demetra Filter, MD Taking Active             Recommendation:   Keep and attend breast biopsy on tomorrow.  Follow Up Plan:   Telephone follow up appointment date/time:  6/2 at 3pm with K. Alejos Husband, RN, MSN, CCM [facility]  [facility], [facility] RN Care Coordinator Direct Dial: [phone] / Main [phone] Fax [phone] Email: [email or web address]: [email or web address]

## 2024-03-02 ENCOUNTER — Ambulatory Visit
Admission: RE | Admit: 2024-03-02 | Discharge: 2024-03-02 | Disposition: A | Discharge status: Home / Self Care | Source: Ambulatory Visit | Attending: Family Medicine | Admitting: Family Medicine

## 2024-03-02 DIAGNOSIS — R599 Enlarged lymph nodes, unspecified: Secondary | ICD-10-CM

## 2024-03-02 DIAGNOSIS — N6320 Unspecified lump in the left breast, unspecified quadrant: Secondary | ICD-10-CM

## 2024-03-02 DIAGNOSIS — C773 Secondary and unspecified malignant neoplasm of axilla and upper limb lymph nodes: Secondary | ICD-10-CM | POA: Diagnosis not present

## 2024-03-02 DIAGNOSIS — N6310 Unspecified lump in the right breast, unspecified quadrant: Secondary | ICD-10-CM

## 2024-03-02 DIAGNOSIS — C50912 Malignant neoplasm of unspecified site of left female breast: Secondary | ICD-10-CM | POA: Diagnosis not present

## 2024-03-02 DIAGNOSIS — R590 Localized enlarged lymph nodes: Secondary | ICD-10-CM | POA: Diagnosis not present

## 2024-03-02 DIAGNOSIS — R928 Other abnormal and inconclusive findings on diagnostic imaging of breast: Secondary | ICD-10-CM

## 2024-03-02 DIAGNOSIS — N6311 Unspecified lump in the right breast, upper outer quadrant: Secondary | ICD-10-CM | POA: Diagnosis not present

## 2024-03-02 HISTORY — PX: BREAST BIOPSY: SHX20

## 2024-03-03 ENCOUNTER — Encounter: Payer: Self-pay | Admitting: *Deleted

## 2024-03-03 LAB — SURGICAL PATHOLOGY

## 2024-03-06 ENCOUNTER — Telehealth: Payer: Self-pay | Admitting: Nurse Practitioner

## 2024-03-06 ENCOUNTER — Telehealth: Payer: Self-pay | Admitting: Hematology and Oncology

## 2024-03-06 ENCOUNTER — Telehealth: Payer: Self-pay

## 2024-03-06 NOTE — Telephone Encounter (Signed)
 [name] rescheduled her appointment for the sooner appointment provided by the nursing team.

## 2024-03-06 NOTE — Telephone Encounter (Signed)
 Completed - [name] scheduled the next available as she is not available on tomorrow for the appointment provided.

## 2024-03-06 NOTE — Telephone Encounter (Signed)
 Patient LVM on nurse line regarding treatment for UTI.   Returned call to patient. She reports that she has been having UTI symptoms for ~1 week. She took an OTC UTI test that showed positive for leukocytes and nitrites.   She reports increased urinary frequency and slight burning.   Denies fever/chills, back or abdominal pain.   Advised patient that I would recommend being seen in the office so that urine culture could be obtained.   Scheduled patient with Dr. Fredrik Jensen for tomorrow morning.   ED precautions discussed.   Elsie Halo, RN

## 2024-03-07 ENCOUNTER — Ambulatory Visit (INDEPENDENT_AMBULATORY_CARE_PROVIDER_SITE_OTHER): Admitting: Family Medicine

## 2024-03-07 VITALS — BP 141/80 | HR 84 | Temp 98.2°F | Wt 200.0 lb

## 2024-03-07 DIAGNOSIS — N3000 Acute cystitis without hematuria: Secondary | ICD-10-CM | POA: Diagnosis not present

## 2024-03-07 DIAGNOSIS — E1142 Type 2 diabetes mellitus with diabetic polyneuropathy: Secondary | ICD-10-CM | POA: Diagnosis not present

## 2024-03-07 DIAGNOSIS — C50212 Malignant neoplasm of upper-inner quadrant of left female breast: Secondary | ICD-10-CM | POA: Insufficient documentation

## 2024-03-07 LAB — POCT URINALYSIS DIP (MANUAL ENTRY)
Bilirubin, UA: NEGATIVE
Glucose, UA: NEGATIVE mg/dL
Ketones, POC UA: NEGATIVE mg/dL
Nitrite, UA: POSITIVE — AB
Protein Ur, POC: 100 mg/dL — AB
Spec Grav, UA: 1.025 (ref 1.010–1.025)
Urobilinogen, UA: 0.2 U/dL
pH, UA: 5.5 (ref 5.0–8.0)

## 2024-03-07 MED ORDER — CEPHALEXIN 500 MG PO CAPS: 500.0000 mg | ORAL_CAPSULE | Freq: Two times a day (BID) | ORAL | 0 refills | Status: AC

## 2024-03-07 NOTE — Patient Instructions (Signed)
 You have a UTI. I have sent in an antibiotic called Keflex to take twice a day for 5 days. I will let you know if we need to change this. Let me know if you continue to worsen despite the medication.

## 2024-03-07 NOTE — Progress Notes (Signed)
    SUBJECTIVE:   CHIEF COMPLAINT / HPI:   UTI concern 1 week history. Worsening pain when urinating and in the back. Brief lower abdominal pain. Also saw some blood in the urine when it first started but this has gone away. Took a test at home which was positive for UTI. She would like to get further evaluated. Have not had a UTI in a long time. No difficulties with bathing or recent intercourse that could be contributing to UTI.  PERTINENT  PMH / PSH: Hypertension with diabetes, allergies, HLD, OA, CKD, vulvovaginitis due to Candida  OBJECTIVE:   BP (!) 141/80   Pulse 84   Temp 98.2 F (36.8 C)   Wt 200 lb (90.7 kg)   SpO2 98%   BMI 36.58 kg/m   General: Alert and oriented, in NAD Skin: Warm, dry, and intact without lesions HEENT: NCAT, EOM grossly normal, midline nasal septum Cardiac: RRR, no m/r/g appreciated Respiratory/Back: CTAB, breathing and speaking comfortably on RA, no CVA tenderness bilaterally Abdominal: Soft, nontender on my exam, nondistended, normoactive bowel sounds Extremities: Moves all extremities grossly equally Neurological: No gross focal deficit Psychiatric: Appropriate mood and affect   ASSESSMENT/PLAN:   Assessment & Plan Acute cystitis without hematuria As evidenced on UA today.  Urine culture sent.  Reassuringly without systemic symptoms or CVA tenderness.  Will send in Keflex 500 mg twice daily for 5 days.  Advised to let me know if this does not help.  If recurrent, would consider further evaluation given patient's age and lack of history of UTIs in the past. Diabetic peripheral neuropathy associated with type 2 diabetes mellitus (HCC) Urine microalbumin/creatinine ratio completed today.  She does have evidence of protein on UA and known CKD.  Advised to follow up with PCP for further DM care gap management.   Genetta Kenning, MD [facility]

## 2024-03-07 NOTE — Assessment & Plan Note (Addendum)
 Urine microalbumin/creatinine ratio completed today.  She does have evidence of protein on UA and known CKD.  Advised to follow up with PCP for further DM care gap management.

## 2024-03-08 ENCOUNTER — Inpatient Hospital Stay: Attending: Nurse Practitioner | Admitting: Nurse Practitioner

## 2024-03-08 ENCOUNTER — Encounter: Payer: Self-pay | Admitting: Nurse Practitioner

## 2024-03-08 ENCOUNTER — Inpatient Hospital Stay

## 2024-03-08 VITALS — BP 138/68 | HR 94 | Temp 98.3°F | Resp 20 | Wt 197.6 lb

## 2024-03-08 DIAGNOSIS — Z96653 Presence of artificial knee joint, bilateral: Secondary | ICD-10-CM | POA: Diagnosis not present

## 2024-03-08 DIAGNOSIS — Z803 Family history of malignant neoplasm of breast: Secondary | ICD-10-CM | POA: Insufficient documentation

## 2024-03-08 DIAGNOSIS — Z170 Estrogen receptor positive status [ER+]: Secondary | ICD-10-CM | POA: Diagnosis not present

## 2024-03-08 DIAGNOSIS — Z1731 Human epidermal growth factor receptor 2 positive status: Secondary | ICD-10-CM | POA: Insufficient documentation

## 2024-03-08 DIAGNOSIS — M549 Dorsalgia, unspecified: Secondary | ICD-10-CM | POA: Diagnosis not present

## 2024-03-08 DIAGNOSIS — C50212 Malignant neoplasm of upper-inner quadrant of left female breast: Secondary | ICD-10-CM | POA: Diagnosis not present

## 2024-03-08 DIAGNOSIS — Z87891 Personal history of nicotine dependence: Secondary | ICD-10-CM | POA: Diagnosis not present

## 2024-03-08 DIAGNOSIS — C773 Secondary and unspecified malignant neoplasm of axilla and upper limb lymph nodes: Secondary | ICD-10-CM | POA: Insufficient documentation

## 2024-03-08 DIAGNOSIS — Z1721 Progesterone receptor positive status: Secondary | ICD-10-CM | POA: Insufficient documentation

## 2024-03-08 DIAGNOSIS — G8929 Other chronic pain: Secondary | ICD-10-CM | POA: Diagnosis not present

## 2024-03-08 DIAGNOSIS — I10 Essential (primary) hypertension: Secondary | ICD-10-CM

## 2024-03-08 LAB — CMP (CANCER CENTER ONLY)
ALT: 18 U/L (ref 0–44)
AST: 15 U/L (ref 15–41)
Albumin: 4.3 g/dL (ref 3.5–5.0)
Alkaline Phosphatase: 68 U/L (ref 38–126)
Anion gap: 10 (ref 5–15)
BUN: 22 mg/dL (ref 8–23)
CO2: 27 mmol/L (ref 22–32)
Calcium: 9.8 mg/dL (ref 8.9–10.3)
Chloride: 101 mmol/L (ref 98–111)
Creatinine: 0.89 mg/dL (ref 0.44–1.00)
GFR, Estimated: >60 mL/min (ref >60)
Glucose, Bld: 199 mg/dL — ABNORMAL HIGH (ref 70–99)
Potassium: 4.3 mmol/L (ref 3.5–5.1)
Sodium: 138 mmol/L (ref 135–145)
Total Bilirubin: 0.3 mg/dL (ref 0.0–1.2)
Total Protein: 8 g/dL (ref 6.5–8.1)

## 2024-03-08 LAB — CBC WITH DIFFERENTIAL (CANCER CENTER ONLY)
Abs Immature Granulocytes: 0.03 10*3/uL (ref 0.00–0.07)
Basophils Absolute: 0.1 10*3/uL (ref 0.0–0.1)
Basophils Relative: 1 %
Eosinophils Absolute: 0.3 10*3/uL (ref 0.0–0.5)
Eosinophils Relative: 3 %
HCT: 34.9 % — ABNORMAL LOW (ref 36.0–46.0)
Hemoglobin: 11.5 g/dL — ABNORMAL LOW (ref 12.0–15.0)
Immature Granulocytes: 0 %
Lymphocytes Relative: 31 %
Lymphs Abs: 2.6 10*3/uL (ref 0.7–4.0)
MCH: 26.3 pg (ref 26.0–34.0)
MCHC: 33 g/dL (ref 30.0–36.0)
MCV: 79.7 fL — ABNORMAL LOW (ref 80.0–100.0)
Monocytes Absolute: 0.5 10*3/uL (ref 0.1–1.0)
Monocytes Relative: 6 %
Neutro Abs: 5 10*3/uL (ref 1.7–7.7)
Neutrophils Relative %: 59 %
Platelet Count: 352 10*3/uL (ref 150–400)
RBC: 4.38 MIL/uL (ref 3.87–5.11)
RDW: 13.2 % (ref 11.5–15.5)
WBC Count: 8.4 10*3/uL (ref 4.0–10.5)
nRBC: 0 % (ref 0.0–0.2)

## 2024-03-08 NOTE — Progress Notes (Addendum)
 [facility]   [phone] Fax:([phone]   Clinic New Consult Note   Patient Care Team: McDiarmid, Demetra Filter, MD as PCP - General Rudine Cos, MD as Consulting Physician (Ophthalmology) Garry Kansas, MD as Consulting Physician (Neurosurgery) Clair Crews, MD as Consulting Physician (Ophthalmology) Ethelene Herald, RN as [facility] Management Auther Bo, RN as Oncology Nurse Navigator Alane Hsu, RN as Oncology Nurse Navigator Sonja Plumerville, MD as Consulting Physician (Hematology) 03/08/2024  CHIEF COMPLAINTS/PURPOSE OF CONSULTATION:  Breast cancer, referred by PCP Dr. Ena Harries McDiarmid  History of Present Illness   [name] is a 73 year old female with PMH including HTN, DM with neuropathy, HL, spinal stenosis and arthritis on chronic opioid medication is here for newly diagnosed breast cancer.  She reportedly had breast cancer diagnosis at [facility] in 2018, but final pathology was benign, she was being monitored every 6 months until COVID then got off schedule.  Presented for routine screening mammogram 02/09/2024 showing a possible mass in the right breast and 2 possible masses in the left breast and concerning axillary lymph node. Diagnostic mammo/US  02/21/24 showed an indeterminate 1.1 cm complex cystic mass within the UPPER-OUTER RIGHT breast and abnormal appearing RIGHT axillary lymph node as well as an indeterminate small UPPER INNER LEFT breast masses spanning 1.9 cm, indeterminate 1.1 cm UPPER-OUTER LEFT breast mass and 2.6 cm LEFT axillary mass/possible abnormal lymph node. Biopsy of the right breast was benign with fibroadenoma and benign LN with reactive changes. 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 The 2 oclock mass showed a lymph node with metastatic carcinoma and the left axilla also showed metastatic carcinoma. Prognostics revealed ER + 100% strong, PR + 40%  weak, HER2 + (3+), and a Ki67 of 35%.   GYN HISTORY  Menarchal: 10 LMP: ? HRT: ? Hormones due to infertility but not after menopause G0   Socially, she is married.  Due to endometriosis and what may have been PCOS (menses regulated with metformin ) she took hormones but was not able to get pregnant.  She eventually went on to adopt 16 children.  1 son who is autistic and a 65 year old granddaughter live with her, she is independent with ADLs and drives but avoids stairs ambulates with a walker and is mostly sedentary but able to do light housework due to arthritis in her knees and spinal stenosis.  Former smoker quit in 1981, denies alcohol or other drug use.  Family history significant for breast cancer in a sister diagnosed 17 years ago and another sister who is currently undergoing treatment.   Today she presents by herself, feeling well in general.  Over time her appetite and taste have changed so she eats less.  She has some nausea related to a UTI or if she takes meds on empty stomach, manages chronic constipation with stool softener. Neuropathy is bad in her fingertips.  Denies cough, chest pain, dyspnea, pain, nipple discharge or inversion, or any skin change.   MEDICAL HISTORY:  Past Medical History:  Diagnosis Date   Acute MEE (middle ear effusion) 06/07/2014   Acute recurrent sinusitis 12/21/2014   Acute sinusitis 12/21/2014   Allergy    Anemia    hx   Asthma, intermittent 11/02/2018   ATTENTION DEFICIT, W/O HYPERACTIVITY 12/16/2006   Bilateral carpal tunnel syndrome 11/20/2016   Cataract    Cervical spondylosis with myelopathy, History of 04/28/2012   COLON  POLYP 12/16/2006   (02/03/12) Surgical [Pathology], sigmoid colon, polyp HYPERPLASTIC POLYP(S) AND POLYPOID FRAGMETNS OF BENIGN COLONIC MUCOSA WITH INTRAMUCOSAL LYMPHOID AGGREGATES. NO ADENOMATOUS CHANGE OR MALIGNANCY IDENTIFIED.    DIABETES MELLITUS II, UNCOMPLICATED 12/16/2006   Fall 10/18/2020   GERD (gastroesophageal  reflux disease)    H/O abnormal mammogram 07/19/2009   S/P Breast Biopsy [facility], [place], 07/2009): Benign findings.     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/20/2016   OSTEOARTHRITIS, MULTI SITES 12/16/2006   Perennial allergic rhinitis with seasonal variation 12/16/2006        PONV (postoperative nausea and vomiting)    last surgery only   Recurrent maxillary sinusitis    SACROILIITIS, HISTORY OF 01/08/2009   Qualifier: History of  By: McDiarmid MD, Todd     Seasonal allergies    Seborrheic keratosis 01/15/2016   SKELETAL HYPEROSTOSIS 03/29/2008   Ulcer    Uterine fibroid 02/02/2011   TVUS: 4cm fibroid w/ submucosal component, bil.hydrosalpinges, unable measurable endometrium - 08/18/2004     SURGICAL HISTORY: Past Surgical History:  Procedure Laterality Date   ANTERIOR CERVICAL DECOMP/DISCECTOMY FUSION  04/28/2012   Procedure: ANTERIOR CERVICAL DECOMPRESSION/DISCECTOMY FUSION 3 LEVELS;  Surgeon: Elder Greening, MD;  Location: [facility];  Service: Neurosurgery;  Laterality: N/A;  Cervical three-four,Cervical four-five,Cervical five-six anterior cervical decompression with fusion interbody prothesis plating and bonegraft   BREAST BIOPSY  07/19/2009   S/P Breast Biopsy [facility], [place], 07/2009): Benign findings.  (10/27/2010)   BREAST BIOPSY Left 02/25/2024   US  LT BREAST BX W LOC DEV 1ST LESION  IMG BX SPEC US  GUIDE 02/25/2024 [facility]   BREAST BIOPSY Left 02/25/2024   US  LT BREAST BX W LOC DEV EA ADD LESION IMG BX SPEC US  GUIDE 02/25/2024 [facility]   BREAST BIOPSY Right 03/02/2024   US  RT BREAST BX W LOC DEV 1ST LESION IMG BX SPEC US  GUIDE 03/02/2024 [facility] MAMMO[facility]   BREAST LUMPECTOMY Left    CARPAL TUNNEL RELEASE Left 12/10/2016   Procedure: LEFT CARPAL TUNNEL RELEASE;  Surgeon: Lyanne Sample, MD;  Location: [facility];  Service: Orthopedics;  Laterality: Left;   COLONOSCOPY W/ POLYPECTOMY  12/17/2000   colonoscopy (Dr Tova Fresh) int. hemorrhoids &  nonneoplatic colon polyps - 01/10/2001,    COLONOSCOPY W/ POLYPECTOMY  01/18/2012   Dr Bridgett Camps (GI).sigmoid colon, hyperlastic polyp   ENDOMETRIAL BIOPSY  08/19/2004   Endometrial Biopsy - 09/01/2004,   NM MYOVIEW  LTD  04/18/2004   Cardiolite:EF63%, no ischemia - 05/01/2004,    REPLACEMENT TOTAL KNEE BILATERAL      SOCIAL HISTORY: Social History   Socioeconomic History   Marital status: Married    Spouse name: Not on file   Number of children: 1   Years of education: Not on file   Highest education level: Not on file  Occupational History   Occupation: Futures trader  Employer: RETIRED  Tobacco Use   Smoking status: Former    Current packs/day: 0.00    Average packs/day: 1 pack/day for 4.0 years (4.0 ttl pk-yrs)    Types: Cigarettes    Start date: 10/20/1975    Quit date: 10/20/1979    Years since quitting: 44.4   Smokeless tobacco: Never  Vaping Use   Vaping status: Never Used  Substance and Sexual Activity   Alcohol use: No   Drug use: No   Sexual activity: Yes  Other Topics Concern   Not on file  Social History Narrative   Lives with husband one adult son with autism and a granddgt, no etoh, Mother of 14 adopted children, many with special needs .Regular exercise-no   Social Drivers of Health   Financial Resource Strain: Low Risk  (02/28/2024)   Overall Financial Resource Strain (CARDIA)     Difficulty of Paying Living Expenses: Not hard at all  Food Insecurity: No Food Insecurity (02/28/2024)   Hunger Vital Sign    Worried About Running Out of Food in the Last Year: Never true    Ran Out of Food in the Last Year: Never true  Transportation Needs: No Transportation Needs (02/28/2024)   PRAPARE - Administrator, Civil Service (Medical): No    Lack of Transportation (Non-Medical): No  Physical Activity: Insufficiently Active (02/28/2024)   Exercise Vital Sign    Days of Exercise per Week: 2 days    Minutes of Exercise per Session: 20 min  Stress: No Stress Concern Present (02/28/2024)   [employer] of Occupational Health - Occupational Stress Questionnaire    Feeling of Stress : Not at all  Social Connections: Socially Integrated (02/28/2024)   Social Connection and Isolation Panel [NHANES]    Frequency of Communication with Friends and Family: More than three times a week    Frequency of Social Gatherings with Friends and Family: Twice a week    Attends Religious Services: 1 to 4 times per year    Active Member of [employer] or Organizations: Yes    Attends Banker Meetings: Never    Marital Status: Married  Catering manager Violence: Not At Risk (02/28/2024)   Humiliation, Afraid, Rape, and Kick questionnaire    Fear of Current or Ex-Partner: No    Emotionally Abused: No    Physically Abused: No    Sexually Abused: No    FAMILY HISTORY: Family History  Problem Relation Age of Onset   Hypertension Mother    Arthritis Mother    Cancer Sister 75       Breast   Cancer Sister 18       Breast   Arthritis Father    Diabetes Father    Heart attack Sister    Drug abuse Sister    Colon cancer Neg Hx    Esophageal cancer Neg Hx    Rectal cancer Neg Hx    Stomach cancer Neg Hx     ALLERGIES:  is allergic to januvia  [sitagliptin ], jardiance  [empagliflozin ], amlodipine , celecoxib, and diclofenac-misoprostol.  MEDICATIONS:  Current Outpatient  Medications  Medication Sig Dispense Refill   amLODipine -benazepril  (LOTREL) 10-20 MG capsule TAKE 1 CAPSULE BY MOUTH DAILY 90 capsule 3   atorvastatin  (LIPITOR) 40 MG tablet Take 1 tablet (40 mg total) by mouth daily. 90 tablet 3   blood glucose meter kit and supplies KIT Dispense based on patient and insurance preference. Use up to four times daily as directed. 1 each 0  Blood Glucose Monitoring Suppl DEVI 1 each by Does not apply route in the morning, at noon, and at bedtime. May substitute to any manufacturer covered by patient's insurance. 1 each 0   carvedilol  (COREG ) 6.25 MG tablet TAKE 1 TABLET(6.25 MG) BY MOUTH TWICE DAILY WITH A MEAL 180 tablet 3   cephALEXin (KEFLEX) 500 MG capsule Take 1 capsule (500 mg total) by mouth 2 (two) times daily for 5 days. 10 capsule 0   fluconazole  (DIFLUCAN ) 150 MG tablet TAKE 1 TABLET BY MOUTH, THEN AGAIN IN 3 DAYS 2 tablet 3   furosemide  (LASIX ) 20 MG tablet Take 1 tablet (20 mg total) by mouth every other day. 30 tablet 3   gabapentin  (NEURONTIN ) 100 MG capsule TAKE 1 CAPSULE(100 MG) BY MOUTH THREE TIMES DAILY AS NEEDED 270 capsule 1   hydrochlorothiazide  (HYDRODIURIL ) 25 MG tablet TAKE 1 TABLET(25 MG) BY MOUTH DAILY 90 tablet 3   HYDROcodone -acetaminophen  (NORCO) 7.5-325 MG tablet Take 1 tablet by mouth every 8 (eight) hours as needed for moderate pain (pain score 4-6). 90 tablet 0   metFORMIN  (GLUCOPHAGE -XR) 500 MG 24 hr tablet TAKE 1 TABLET(500 MG) BY MOUTH TWICE DAILY WITH A MEAL 180 tablet 3   methylphenidate  (RITALIN ) 10 MG tablet Take 2 tablets (20 mg total) by mouth 3 (three) times daily with meals. 180 tablet 0   naproxen  (NAPROSYN ) 500 MG tablet TAKE 1 TABLET(500 MG) BY MOUTH TWICE DAILY WITH A MEAL 180 tablet 3   OVER THE COUNTER MEDICATION Store brand Ducolax stool softener. One capsule daily.     vitamin B-12 (CYANOCOBALAMIN) 1000 MCG tablet Take 1 tablet (1,000 mcg total) by mouth daily. 180 tablet 3   No current facility-administered  medications for this visit.    REVIEW OF SYSTEMS:   Constitutional: Denies fevers, chills or abnormal night sweats Eyes: Denies blurriness of vision, double vision or watery eyes Ears, nose, mouth, throat, and face: Denies mucositis or sore throat Respiratory: Denies cough, dyspnea or wheezes Cardiovascular: Denies palpitation, chest discomfort or lower extremity swelling Gastrointestinal:  Denies heartburn or change in bowel habits (+) chronic constipation (+) intermittent nausea Skin: Denies abnormal skin rashes Lymphatics: Denies new lymphadenopathy or easy bruising Neurological:Denies new weaknesses (+) DM-related neuropathy  MSK: (+) spinal stenosis (+) OA Behavioral/Psych: Mood is stable, no new changes  All other systems were reviewed with the patient and are negative.  PHYSICAL EXAMINATION: ECOG PERFORMANCE STATUS: 2 - Symptomatic, <50% confined to bed  Vitals:   03/08/24 0929 03/08/24 0930  BP: (!) 150/72 138/68  Pulse: 94   Resp: 20   Temp: 98.3 F (36.8 C)   SpO2: 99%    Filed Weights   03/08/24 0929  Weight: 197 lb 9.6 oz (89.6 kg)    GENERAL:alert, no distress and comfortable SKIN: no rashes or significant lesions EYES: sclera clear NECK: without mass  LYMPH:  no palpable cervical or supraclavicular lymphadenopathy LUNGS: clear with normal breathing effort HEART: regular rate & rhythm, no lower extremity edema ABDOMEN:abdomen soft, non-tender and normal bowel sounds Musculoskeletal:no cyanosis of digits and no clubbing  PSYCH: alert & oriented x 3 with fluent speech NEURO: no focal motor/sensory deficits Breast exam: No nipple discharge or inversion.  Ecchymosis at the left inner breast from biopsy, no palpable mass or nodularity in either breast or axilla that I could appreciate   LABORATORY DATA:  I have reviewed the data as listed    Latest Ref Rng & Units 03/08/2024   11:40 AM 03/07/2020  1:53 PM 06/05/2013    3:42 PM  CBC  WBC 4.0 - 10.5 K/uL  8.4  6.2  6.1   Hemoglobin 12.0 - 15.0 g/dL 40.9  81.1  91.4   Hematocrit 36.0 - 46.0 % 34.9  35.5  38.6   Platelets 150 - 400 K/uL 352  289  347        Latest Ref Rng & Units 03/08/2024   11:40 AM 01/21/2023    2:45 PM 11/26/2022    2:10 PM  CMP  Glucose 70 - 99 mg/dL 782  956  213   BUN 8 - 23 mg/dL 22  22  19    Creatinine 0.44 - 1.00 mg/dL 0.86  5.78  4.69   Sodium 135 - 145 mmol/L 138  137  139   Potassium 3.5 - 5.1 mmol/L 4.3  4.3  4.0   Chloride 98 - 111 mmol/L 101  97  99   CO2 22 - 32 mmol/L 27  21  20    Calcium  8.9 - 10.3 mg/dL 9.8  9.6  9.6   Total Protein 6.5 - 8.1 g/dL 8.0   7.1   Total Bilirubin 0.0 - 1.2 mg/dL 0.3   <6.2   Alkaline Phos 38 - 126 U/L 68   79   AST 15 - 41 U/L 15   16   ALT 0 - 44 U/L 18   14      RADIOGRAPHIC STUDIES: I have personally reviewed the radiological images as listed and agreed with the findings in the report. US  RT BREAST BX W LOC DEV 1ST LESION IMG BX SPEC US  GUIDE Addendum Date: 03/03/2024 ADDENDUM REPORT: 03/03/2024 14:23 ADDENDUM: Pathology revealed FIBROADENOMATOID CHANGES of the RIGHT breast, (heart clip). This was found to be concordant by Dr. Severiano Danish. Pathology revealed BENIGN LYMPH NODE WITH REACTIVE FEATURES of the RIGHT axilla, (hydromark clip). This was found to be concordant by Dr. Severiano Danish. Pathology results were discussed with the patient by telephone. The patient reported doing well after the biopsies with tenderness at the sites. Post biopsy instructions and care were reviewed and questions were answered. The patient was encouraged to call [facility] for any additional concerns. My direct phone number was provided. The patient has a recent diagnosis of LEFT breast cancer and medical oncology consultation has been requested at [facility]. Pathology results reported by Kraig Peru, RN on 03/03/2024. Electronically Signed   By: Sundra Engel M.D.   On: 03/03/2024 14:23   Result Date:  03/03/2024 CLINICAL DATA:  73 year old female presents for ultrasound-guided biopsies of a 1.1 cm UPPER-OUTER RIGHT breast mass and an abnormal RIGHT axillary lymph node. Recent diagnosis of LEFT breast cancer with LEFT axillary lymph node metastasis. EXAM: ULTRASOUND GUIDED RIGHT BREAST CORE NEEDLE BIOPSY ULTRASOUND-GUIDED RIGHT AXILLARY LYMPH NODE BIOPSY COMPARISON:  Previous exam(s). PROCEDURE: I met with the patient and we discussed the procedure of ultrasound-guided biopsy, including benefits and alternatives. We discussed the high likelihood of a successful procedure. We discussed the risks of the procedure, including infection, bleeding, tissue injury, clip migration, and inadequate sampling. Informed written consent was given. The usual time-out protocol was performed immediately prior to the procedure. ULTRASOUND GUIDED RIGHT BREAST CORE NEEDLE BIOPSY Lesion quadrant: UPPER-OUTER RIGHT breast Using sterile technique and 1% Lidocaine  with and without epinephrine  as local anesthetic, under direct ultrasound visualization, a 12 gauge spring-loaded device was used to perform biopsy of the 1.1 cm mass at the 10 o'clock  position 3 cm from the nipple using a LATERAL approach. At the conclusion of the procedure a HEART shaped tissue marker clip was deployed into the biopsy cavity. Follow up 2 view mammogram was performed and dictated separately. ULTRASOUND-GUIDED RIGHT AXILLARY LYMPH NODE BIOPSY Using sterile technique and 1% Lidocaine  with and without epinephrine  as local anesthetic, under direct ultrasound visualization, a 14 gauge spring-loaded device was used to perform biopsy of the abnormal appearing RIGHT axillary lymph node using a LATERAL approach. At the conclusion of the procedure a HydroMARK spiral shaped tissue marker clip was deployed into the biopsy cavity. Follow up 2 view mammogram was performed and dictated separately. IMPRESSION: Ultrasound guided biopsy of 1.1 cm 10 o'clock position RIGHT breast  mass. Ultrasound-guided biopsy of abnormal appearing RIGHT axillary lymph node. No apparent complications. Electronically Signed: By: Sundra Engel M.D. On: 03/02/2024 11:14   US  AXILLARY NODE CORE BIOPSY RIGHT Addendum Date: 03/03/2024 ADDENDUM REPORT: 03/03/2024 14:23 ADDENDUM: Pathology revealed FIBROADENOMATOID CHANGES of the RIGHT breast, (heart clip). This was found to be concordant by Dr. Severiano Danish. Pathology revealed BENIGN LYMPH NODE WITH REACTIVE FEATURES of the RIGHT axilla, (hydromark clip). This was found to be concordant by Dr. Severiano Danish. Pathology results were discussed with the patient by telephone. The patient reported doing well after the biopsies with tenderness at the sites. Post biopsy instructions and care were reviewed and questions were answered. The patient was encouraged to call [facility] for any additional concerns. My direct phone number was provided. The patient has a recent diagnosis of LEFT breast cancer and medical oncology consultation has been requested at [facility]. Pathology results reported by Kraig Peru, RN on 03/03/2024. Electronically Signed   By: Sundra Engel M.D.   On: 03/03/2024 14:23   Result Date: 03/03/2024 CLINICAL DATA:  73 year old female presents for ultrasound-guided biopsies of a 1.1 cm UPPER-OUTER RIGHT breast mass and an abnormal RIGHT axillary lymph node. Recent diagnosis of LEFT breast cancer with LEFT axillary lymph node metastasis. EXAM: ULTRASOUND GUIDED RIGHT BREAST CORE NEEDLE BIOPSY ULTRASOUND-GUIDED RIGHT AXILLARY LYMPH NODE BIOPSY COMPARISON:  Previous exam(s). PROCEDURE: I met with the patient and we discussed the procedure of ultrasound-guided biopsy, including benefits and alternatives. We discussed the high likelihood of a successful procedure. We discussed the risks of the procedure, including infection, bleeding, tissue injury, clip migration, and inadequate sampling. Informed written consent was given.  The usual time-out protocol was performed immediately prior to the procedure. ULTRASOUND GUIDED RIGHT BREAST CORE NEEDLE BIOPSY Lesion quadrant: UPPER-OUTER RIGHT breast Using sterile technique and 1% Lidocaine  with and without epinephrine  as local anesthetic, under direct ultrasound visualization, a 12 gauge spring-loaded device was used to perform biopsy of the 1.1 cm mass at the 10 o'clock position 3 cm from the nipple using a LATERAL approach. At the conclusion of the procedure a HEART shaped tissue marker clip was deployed into the biopsy cavity. Follow up 2 view mammogram was performed and dictated separately. ULTRASOUND-GUIDED RIGHT AXILLARY LYMPH NODE BIOPSY Using sterile technique and 1% Lidocaine  with and without epinephrine  as local anesthetic, under direct ultrasound visualization, a 14 gauge spring-loaded device was used to perform biopsy of the abnormal appearing RIGHT axillary lymph node using a LATERAL approach. At the conclusion of the procedure a HydroMARK spiral shaped tissue marker clip was deployed into the biopsy cavity. Follow up 2 view mammogram was performed and dictated separately. IMPRESSION: Ultrasound guided biopsy of 1.1 cm 10 o'clock position RIGHT breast  mass. Ultrasound-guided biopsy of abnormal appearing RIGHT axillary lymph node. No apparent complications. Electronically Signed: By: Sundra Engel M.D. On: 03/02/2024 11:14   MM CLIP PLACEMENT RIGHT Result Date: 03/02/2024 CLINICAL DATA:  Evaluate placement of biopsy clips following ultrasound-guided RIGHT breast and RIGHT axillary biopsies. EXAM: 3D DIAGNOSTIC RIGHT MAMMOGRAM POST ULTRASOUND BIOPSY COMPARISON:  Previous exam(s). ACR Breast Density Category b: There are scattered areas of fibroglandular density. FINDINGS: 3D Mammographic images were obtained following ultrasound guided biopsy of the 1.1 cm mass at the 10 o'clock position of the RIGHT breast (heart clip) and abnormal RIGHT axillary lymph node (HydroMARK spiral clip).  The heart and spiral biopsy clips are in expected position at the site of biopsy. IMPRESSION: Appropriate positioning of the heart shaped biopsy marking clip at the site of biopsy in the anterior UPPER-OUTER RIGHT breast. Appropriate positioning of the spiral shaped biopsy marking clip in the RIGHT axilla. Final Assessment: Post Procedure Mammograms for Marker Placement Electronically Signed   By: Sundra Engel M.D.   On: 03/02/2024 11:40   US  LT BREAST BX W LOC DEV 1ST LESION IMG BX SPEC US  GUIDE Addendum Date: 02/28/2024 ADDENDUM REPORT: 02/28/2024 14:27 ADDENDUM: Pathology revealed GRADE III INVASIVE DUCTAL CARCINOMA, LYMPHOVASCULAR INVASION: PRESENT of the LEFT breast 1.9 cm mass, 10 o'clock, 10cmfn, (RIBBON clip). This was found to be concordant by Dr. Severiano Danish. Pathology revealed LYMPH NODE WITH METASTATIC CARCINOMA of the LEFT breast 1.1 cm mass, 2 o'clock, 14cmfn, (COIL clip). This was found to be concordant by Dr. Severiano Danish. Pathology revealed LYMPH NODE WITH METASTATIC CARCINOMA of the LEFT 2.6 cm axillary mass, (hydromark spiral clip). This was found to be concordant by Dr. Severiano Danish. Pathology results were discussed with the patient by telephone. The patient reported doing well after the biopsies with tenderness, bruising and swelling at the sites. Post biopsy instructions and care were reviewed and questions were answered. The patient was encouraged to call [facility] for any additional concerns. My direct phone number was provided. The patient is scheduled for a The patient is scheduled for a RIGHT breast and axillary guided biopsies on Mar 02, 2024. Further recommendations will be guided by the results of these biopsies. The patient is requesting deferral of referrals until all biopsy results are received. Pathology results reported by Kraig Peru, RN on 02/28/2024. Electronically Signed   By: Sundra Engel M.D.   On: 02/28/2024 14:27   Result Date: 02/28/2024 CLINICAL DATA:   73 year old female presents for ultrasound-guided biopsy of 1.9 cm UPPER INNER LEFT breast mass, 1.1 cm UPPER-OUTER LEFT breast mass and 2.6 cm LEFT axillary mass/possible abnormal lymph node. EXAM: ULTRASOUND GUIDED LEFT BREAST CORE NEEDLE BIOPSY X 3 COMPARISON:  Previous exam(s). PROCEDURE: I met with the patient and we discussed the procedure of ultrasound-guided biopsy, including benefits and alternatives. We discussed the high likelihood of successful procedures. We discussed the risks of the procedures, including infection, bleeding, tissue injury, clip migration, and inadequate sampling. Informed written consent was given. The usual time-out protocol was performed immediately prior to the procedures. ULTRASOUND GUIDED LEFT BREAST CORE NEEDLE BIOPSY #1 (1.9 cm 10 o'clock position UPPER INNER LEFT breast mass-RIBBON clip): Lesion quadrant: UPPER INNER LEFT breast Using sterile technique and 1% Lidocaine  as local anesthetic, under direct ultrasound visualization, a 12 gauge spring-loaded device was used to perform biopsy of the 1.9 cm mass at the 10 o'clock position of the LEFT breast 10 cm from the nipple using a MEDIAL approach.  At the conclusion of the procedure a RIBBON shaped tissue marker clip was deployed into the biopsy cavity. Follow up 2 view mammogram was performed and dictated separately. ULTRASOUND GUIDED LEFT BREAST CORE NEEDLE BIOPSY #2 (1.1 cm 2 o'clock position UPPER OUTER LEFT breast mass-COIL clip): Lesion quadrant: UPPER-OUTER LEFT breast Using sterile technique and 1% Lidocaine  as local anesthetic, under direct ultrasound visualization, a 12 gauge spring-loaded device was used to perform biopsy of the 1.1 cm 2 o'clock position LEFT breast mass using a MEDIAL approach. At the conclusion of the procedure a COIL shaped tissue marker clip was deployed into the biopsy cavity. Follow up 2 view mammogram was performed and dictated separately. ULTRASOUND GUIDED LEFT AXILLA CORE NEEDLE BIOPSY #3 (2.6  cm LEFT axillary mass-HydroMARK spiral clip): Using sterile technique and 1% Lidocaine  as local anesthetic, under direct ultrasound visualization, a 12 gauge spring-loaded device was used to perform biopsy of the 2.6 cm LEFT axillary mass using a MEDIAL approach. At the conclusion of the procedure a HydroMARK spiral shaped tissue marker clip was deployed into the biopsy cavity. Follow up 2 view mammogram was performed and dictated separately. IMPRESSION: Ultrasound guided biopsy of 1.9 cm 10 o'clock position LEFT breast mass (RIBBON clip). Ultrasound-guided biopsy of 1.1 cm 2 o'clock position LEFT breast mass (COIL clip). Ultrasound-guided biopsy of 2.6 cm LEFT axillary mass (HydroMARK spiral clip). No apparent complications. Electronically Signed: By: Sundra Engel M.D. On: 02/25/2024 11:39   US  LT BREAST BX W LOC DEV EA ADD LESION IMG BX SPEC US  GUIDE Addendum Date: 02/28/2024 ADDENDUM REPORT: 02/28/2024 14:27 ADDENDUM: Pathology revealed GRADE III INVASIVE DUCTAL CARCINOMA, LYMPHOVASCULAR INVASION: PRESENT of the LEFT breast 1.9 cm mass, 10 o'clock, 10cmfn, (RIBBON clip). This was found to be concordant by Dr. Severiano Danish. Pathology revealed LYMPH NODE WITH METASTATIC CARCINOMA of the LEFT breast 1.1 cm mass, 2 o'clock, 14cmfn, (COIL clip). This was found to be concordant by Dr. Severiano Danish. Pathology revealed LYMPH NODE WITH METASTATIC CARCINOMA of the LEFT 2.6 cm axillary mass, (hydromark spiral clip). This was found to be concordant by Dr. Severiano Danish. Pathology results were discussed with the patient by telephone. The patient reported doing well after the biopsies with tenderness, bruising and swelling at the sites. Post biopsy instructions and care were reviewed and questions were answered. The patient was encouraged to call [facility] for any additional concerns. My direct phone number was provided. The patient is scheduled for a The patient is scheduled for a RIGHT breast and axillary  guided biopsies on Mar 02, 2024. Further recommendations will be guided by the results of these biopsies. The patient is requesting deferral of referrals until all biopsy results are received. Pathology results reported by Kraig Peru, RN on 02/28/2024. Electronically Signed   By: Sundra Engel M.D.   On: 02/28/2024 14:27   Result Date: 02/28/2024 CLINICAL DATA:  73 year old female presents for ultrasound-guided biopsy of 1.9 cm UPPER INNER LEFT breast mass, 1.1 cm UPPER-OUTER LEFT breast mass and 2.6 cm LEFT axillary mass/possible abnormal lymph node. EXAM: ULTRASOUND GUIDED LEFT BREAST CORE NEEDLE BIOPSY X 3 COMPARISON:  Previous exam(s). PROCEDURE: I met with the patient and we discussed the procedure of ultrasound-guided biopsy, including benefits and alternatives. We discussed the high likelihood of successful procedures. We discussed the risks of the procedures, including infection, bleeding, tissue injury, clip migration, and inadequate sampling. Informed written consent was given. The usual time-out protocol was performed immediately prior to the procedures. ULTRASOUND  GUIDED LEFT BREAST CORE NEEDLE BIOPSY #1 (1.9 cm 10 o'clock position UPPER INNER LEFT breast mass-RIBBON clip): Lesion quadrant: UPPER INNER LEFT breast Using sterile technique and 1% Lidocaine  as local anesthetic, under direct ultrasound visualization, a 12 gauge spring-loaded device was used to perform biopsy of the 1.9 cm mass at the 10 o'clock position of the LEFT breast 10 cm from the nipple using a MEDIAL approach. At the conclusion of the procedure a RIBBON shaped tissue marker clip was deployed into the biopsy cavity. Follow up 2 view mammogram was performed and dictated separately. ULTRASOUND GUIDED LEFT BREAST CORE NEEDLE BIOPSY #2 (1.1 cm 2 o'clock position UPPER OUTER LEFT breast mass-COIL clip): Lesion quadrant: UPPER-OUTER LEFT breast Using sterile technique and 1% Lidocaine  as local anesthetic, under direct ultrasound  visualization, a 12 gauge spring-loaded device was used to perform biopsy of the 1.1 cm 2 o'clock position LEFT breast mass using a MEDIAL approach. At the conclusion of the procedure a COIL shaped tissue marker clip was deployed into the biopsy cavity. Follow up 2 view mammogram was performed and dictated separately. ULTRASOUND GUIDED LEFT AXILLA CORE NEEDLE BIOPSY #3 (2.6 cm LEFT axillary mass-HydroMARK spiral clip): Using sterile technique and 1% Lidocaine  as local anesthetic, under direct ultrasound visualization, a 12 gauge spring-loaded device was used to perform biopsy of the 2.6 cm LEFT axillary mass using a MEDIAL approach. At the conclusion of the procedure a HydroMARK spiral shaped tissue marker clip was deployed into the biopsy cavity. Follow up 2 view mammogram was performed and dictated separately. IMPRESSION: Ultrasound guided biopsy of 1.9 cm 10 o'clock position LEFT breast mass (RIBBON clip). Ultrasound-guided biopsy of 1.1 cm 2 o'clock position LEFT breast mass (COIL clip). Ultrasound-guided biopsy of 2.6 cm LEFT axillary mass (HydroMARK spiral clip). No apparent complications. Electronically Signed: By: Sundra Engel M.D. On: 02/25/2024 11:39   US  AXILLARY NODE CORE BIOPSY LEFT Addendum Date: 02/28/2024 ADDENDUM REPORT: 02/28/2024 14:27 ADDENDUM: Pathology revealed GRADE III INVASIVE DUCTAL CARCINOMA, LYMPHOVASCULAR INVASION: PRESENT of the LEFT breast 1.9 cm mass, 10 o'clock, 10cmfn, (RIBBON clip). This was found to be concordant by Dr. Severiano Danish. Pathology revealed LYMPH NODE WITH METASTATIC CARCINOMA of the LEFT breast 1.1 cm mass, 2 o'clock, 14cmfn, (COIL clip). This was found to be concordant by Dr. Severiano Danish. Pathology revealed LYMPH NODE WITH METASTATIC CARCINOMA of the LEFT 2.6 cm axillary mass, (hydromark spiral clip). This was found to be concordant by Dr. Severiano Danish. Pathology results were discussed with the patient by telephone. The patient reported doing well after the biopsies with  tenderness, bruising and swelling at the sites. Post biopsy instructions and care were reviewed and questions were answered. The patient was encouraged to call [facility] for any additional concerns. My direct phone number was provided. The patient is scheduled for a The patient is scheduled for a RIGHT breast and axillary guided biopsies on Mar 02, 2024. Further recommendations will be guided by the results of these biopsies. The patient is requesting deferral of referrals until all biopsy results are received. Pathology results reported by Kraig Peru, RN on 02/28/2024. Electronically Signed   By: Sundra Engel M.D.   On: 02/28/2024 14:27   Result Date: 02/28/2024 CLINICAL DATA:  73 year old female presents for ultrasound-guided biopsy of 1.9 cm UPPER INNER LEFT breast mass, 1.1 cm UPPER-OUTER LEFT breast mass and 2.6 cm LEFT axillary mass/possible abnormal lymph node. EXAM: ULTRASOUND GUIDED LEFT BREAST CORE NEEDLE BIOPSY X 3 COMPARISON:  Previous  exam(s). PROCEDURE: I met with the patient and we discussed the procedure of ultrasound-guided biopsy, including benefits and alternatives. We discussed the high likelihood of successful procedures. We discussed the risks of the procedures, including infection, bleeding, tissue injury, clip migration, and inadequate sampling. Informed written consent was given. The usual time-out protocol was performed immediately prior to the procedures. ULTRASOUND GUIDED LEFT BREAST CORE NEEDLE BIOPSY #1 (1.9 cm 10 o'clock position UPPER INNER LEFT breast mass-RIBBON clip): Lesion quadrant: UPPER INNER LEFT breast Using sterile technique and 1% Lidocaine  as local anesthetic, under direct ultrasound visualization, a 12 gauge spring-loaded device was used to perform biopsy of the 1.9 cm mass at the 10 o'clock position of the LEFT breast 10 cm from the nipple using a MEDIAL approach. At the conclusion of the procedure a RIBBON shaped tissue marker clip was  deployed into the biopsy cavity. Follow up 2 view mammogram was performed and dictated separately. ULTRASOUND GUIDED LEFT BREAST CORE NEEDLE BIOPSY #2 (1.1 cm 2 o'clock position UPPER OUTER LEFT breast mass-COIL clip): Lesion quadrant: UPPER-OUTER LEFT breast Using sterile technique and 1% Lidocaine  as local anesthetic, under direct ultrasound visualization, a 12 gauge spring-loaded device was used to perform biopsy of the 1.1 cm 2 o'clock position LEFT breast mass using a MEDIAL approach. At the conclusion of the procedure a COIL shaped tissue marker clip was deployed into the biopsy cavity. Follow up 2 view mammogram was performed and dictated separately. ULTRASOUND GUIDED LEFT AXILLA CORE NEEDLE BIOPSY #3 (2.6 cm LEFT axillary mass-HydroMARK spiral clip): Using sterile technique and 1% Lidocaine  as local anesthetic, under direct ultrasound visualization, a 12 gauge spring-loaded device was used to perform biopsy of the 2.6 cm LEFT axillary mass using a MEDIAL approach. At the conclusion of the procedure a HydroMARK spiral shaped tissue marker clip was deployed into the biopsy cavity. Follow up 2 view mammogram was performed and dictated separately. IMPRESSION: Ultrasound guided biopsy of 1.9 cm 10 o'clock position LEFT breast mass (RIBBON clip). Ultrasound-guided biopsy of 1.1 cm 2 o'clock position LEFT breast mass (COIL clip). Ultrasound-guided biopsy of 2.6 cm LEFT axillary mass (HydroMARK spiral clip). No apparent complications. Electronically Signed: By: Sundra Engel M.D. On: 02/25/2024 11:39   MM CLIP PLACEMENT LEFT Result Date: 02/25/2024 CLINICAL DATA:  Evaluate placement of biopsy clips following ultrasound-guided LEFT breast/axillary biopsies. EXAM: 3D DIAGNOSTIC LEFT MAMMOGRAM POST ULTRASOUND BIOPSY COMPARISON:  Previous exam(s). ACR Breast Density Category b: There are scattered areas of fibroglandular density. FINDINGS: 3D Mammographic images were obtained following ultrasound guided biopsy of the  1.9 cm 10 o'clock position LEFT breast mass (RIBBON clip), the 1.1 cm 2 o'clock position LEFT breast mass (COIL clip) and 2.6 cm LEFT axillary mass (HydroMARK spiral clip). The RIBBON biopsy marking clip is in expected position at the site of biopsy. The COIL biopsy marking clip is in expected position at the site of biopsy. The HydroMARK spiral biopsy marking clip is in expected position at the site of biopsy. IMPRESSION: Appropriate positioning of the RIBBON shaped biopsy marking clip at the site of biopsy in the UPPER INNER LEFT breast. Appropriate positioning of the COIL shaped biopsy marking clip at the site of biopsy in the UPPER OUTER LEFT breast. Appropriate positioning of the HydroMARK spiral shaped biopsy marking clip at the site of biopsy in the LEFT axilla. Final Assessment: Post Procedure Mammograms for Marker Placement Electronically Signed   By: Sundra Engel M.D.   On: 02/25/2024 11:38   MM 3D DIAGNOSTIC MAMMOGRAM BILATERAL  BREAST Result Date: 02/21/2024 CLINICAL DATA:  73 year old female presents for further evaluation of possible bilateral breast masses and possible abnormal LEFT axillary lymph node on new baseline screening mammogram. EXAM: DIGITAL DIAGNOSTIC BILATERAL MAMMOGRAM WITH TOMOSYNTHESIS AND CAD; ULTRASOUND RIGHT BREAST LIMITED; ULTRASOUND LEFT BREAST LIMITED TECHNIQUE: Bilateral digital diagnostic mammography and breast tomosynthesis was performed. The images were evaluated with computer-aided detection. ; Targeted ultrasound examination of the right breast was performed; Targeted ultrasound examination of the left breast was performed. COMPARISON:  Previous exam(s). ACR Breast Density Category b: There are scattered areas of fibroglandular density. FINDINGS: Spot compression views of both breast demonstrate the following: A persistent circumscribed oval mass within the anterior UPPER-OUTER RIGHT breast. A persistent circumscribed oval mass within the INNER LEFT breast. A persistent  circumscribed oval mass within the posterior UPPER-OUTER LEFT breast. Targeted ultrasound is performed, showing the following: RIGHT: A 1.1 x 0.5 x 0.9 cm complex cystic mass at 10 o'clock position of the RIGHT breast 3 cm from the nipple. A single abnormal appearing round RIGHT axillary lymph node with possibly effaced hilum measuring 1 cm. LEFT: A string of abnormal masses spanning a distance of 1.9 cm at the 10 o'clock position of the LEFT breast 10 cm from the nipple. The largest discrete mass measures 0.5 cm. A 0.9 x 0.8 x 1.1 cm hypoechoic mass at the 2 o'clock position of the LEFT breast 14 cm from the nipple. A 2.6 x 0.8 x 1.6 cm hypoechoic mass/possible abnormal lymph node in the LEFT axilla. IMPRESSION: 1. Indeterminate 1.1 cm complex cystic mass within the UPPER-OUTER RIGHT breast and abnormal appearing RIGHT axillary lymph node. Ultrasound-guided biopsy is recommended. 2. Indeterminate small UPPER INNER LEFT breast masses spanning 1.9 cm, indeterminate 1.1 cm UPPER-OUTER LEFT breast mass and 2.6 cm LEFT axillary mass/possible abnormal lymph node. Ultrasound-guided biopsy of all 3 of these abnormalities recommended. RECOMMENDATION: 1. Ultrasound-guided biopsy of UPPER-OUTER RIGHT breast mass and abnormal appearing RIGHT axillary lymph node. 2. Ultrasound-guided biopsy of UPPER INNER LEFT breast masses, UPPER OUTER LEFT breast mass and LEFT axillary mass/possible lymph node. 3. These procedures will be scheduled. I have discussed the findings and recommendations with the patient. If applicable, a reminder letter will be sent to the patient regarding the next appointment. BI-RADS CATEGORY  4: Suspicious. Electronically Signed   By: Sundra Engel M.D.   On: 02/21/2024 15:57   US  LIMITED ULTRASOUND INCLUDING AXILLA LEFT BREAST  Result Date: 02/21/2024 CLINICAL DATA:  73 year old female presents for further evaluation of possible bilateral breast masses and possible abnormal LEFT axillary lymph node on new  baseline screening mammogram. EXAM: DIGITAL DIAGNOSTIC BILATERAL MAMMOGRAM WITH TOMOSYNTHESIS AND CAD; ULTRASOUND RIGHT BREAST LIMITED; ULTRASOUND LEFT BREAST LIMITED TECHNIQUE: Bilateral digital diagnostic mammography and breast tomosynthesis was performed. The images were evaluated with computer-aided detection. ; Targeted ultrasound examination of the right breast was performed; Targeted ultrasound examination of the left breast was performed. COMPARISON:  Previous exam(s). ACR Breast Density Category b: There are scattered areas of fibroglandular density. FINDINGS: Spot compression views of both breast demonstrate the following: A persistent circumscribed oval mass within the anterior UPPER-OUTER RIGHT breast. A persistent circumscribed oval mass within the INNER LEFT breast. A persistent circumscribed oval mass within the posterior UPPER-OUTER LEFT breast. Targeted ultrasound is performed, showing the following: RIGHT: A 1.1 x 0.5 x 0.9 cm complex cystic mass at 10 o'clock position of the RIGHT breast 3 cm from the nipple. A single abnormal appearing round RIGHT axillary lymph node with possibly  effaced hilum measuring 1 cm. LEFT: A string of abnormal masses spanning a distance of 1.9 cm at the 10 o'clock position of the LEFT breast 10 cm from the nipple. The largest discrete mass measures 0.5 cm. A 0.9 x 0.8 x 1.1 cm hypoechoic mass at the 2 o'clock position of the LEFT breast 14 cm from the nipple. A 2.6 x 0.8 x 1.6 cm hypoechoic mass/possible abnormal lymph node in the LEFT axilla. IMPRESSION: 1. Indeterminate 1.1 cm complex cystic mass within the UPPER-OUTER RIGHT breast and abnormal appearing RIGHT axillary lymph node. Ultrasound-guided biopsy is recommended. 2. Indeterminate small UPPER INNER LEFT breast masses spanning 1.9 cm, indeterminate 1.1 cm UPPER-OUTER LEFT breast mass and 2.6 cm LEFT axillary mass/possible abnormal lymph node. Ultrasound-guided biopsy of all 3 of these abnormalities recommended.  RECOMMENDATION: 1. Ultrasound-guided biopsy of UPPER-OUTER RIGHT breast mass and abnormal appearing RIGHT axillary lymph node. 2. Ultrasound-guided biopsy of UPPER INNER LEFT breast masses, UPPER OUTER LEFT breast mass and LEFT axillary mass/possible lymph node. 3. These procedures will be scheduled. I have discussed the findings and recommendations with the patient. If applicable, a reminder letter will be sent to the patient regarding the next appointment. BI-RADS CATEGORY  4: Suspicious. Electronically Signed   By: Sundra Engel M.D.   On: 02/21/2024 15:57   US  LIMITED ULTRASOUND INCLUDING AXILLA RIGHT BREAST Result Date: 02/21/2024 CLINICAL DATA:  73 year old female presents for further evaluation of possible bilateral breast masses and possible abnormal LEFT axillary lymph node on new baseline screening mammogram. EXAM: DIGITAL DIAGNOSTIC BILATERAL MAMMOGRAM WITH TOMOSYNTHESIS AND CAD; ULTRASOUND RIGHT BREAST LIMITED; ULTRASOUND LEFT BREAST LIMITED TECHNIQUE: Bilateral digital diagnostic mammography and breast tomosynthesis was performed. The images were evaluated with computer-aided detection. ; Targeted ultrasound examination of the right breast was performed; Targeted ultrasound examination of the left breast was performed. COMPARISON:  Previous exam(s). ACR Breast Density Category b: There are scattered areas of fibroglandular density. FINDINGS: Spot compression views of both breast demonstrate the following: A persistent circumscribed oval mass within the anterior UPPER-OUTER RIGHT breast. A persistent circumscribed oval mass within the INNER LEFT breast. A persistent circumscribed oval mass within the posterior UPPER-OUTER LEFT breast. Targeted ultrasound is performed, showing the following: RIGHT: A 1.1 x 0.5 x 0.9 cm complex cystic mass at 10 o'clock position of the RIGHT breast 3 cm from the nipple. A single abnormal appearing round RIGHT axillary lymph node with possibly effaced hilum measuring 1 cm.  LEFT: A string of abnormal masses spanning a distance of 1.9 cm at the 10 o'clock position of the LEFT breast 10 cm from the nipple. The largest discrete mass measures 0.5 cm. A 0.9 x 0.8 x 1.1 cm hypoechoic mass at the 2 o'clock position of the LEFT breast 14 cm from the nipple. A 2.6 x 0.8 x 1.6 cm hypoechoic mass/possible abnormal lymph node in the LEFT axilla. IMPRESSION: 1. Indeterminate 1.1 cm complex cystic mass within the UPPER-OUTER RIGHT breast and abnormal appearing RIGHT axillary lymph node. Ultrasound-guided biopsy is recommended. 2. Indeterminate small UPPER INNER LEFT breast masses spanning 1.9 cm, indeterminate 1.1 cm UPPER-OUTER LEFT breast mass and 2.6 cm LEFT axillary mass/possible abnormal lymph node. Ultrasound-guided biopsy of all 3 of these abnormalities recommended. RECOMMENDATION: 1. Ultrasound-guided biopsy of UPPER-OUTER RIGHT breast mass and abnormal appearing RIGHT axillary lymph node. 2. Ultrasound-guided biopsy of UPPER INNER LEFT breast masses, UPPER OUTER LEFT breast mass and LEFT axillary mass/possible lymph node. 3. These procedures will be scheduled. I have discussed the findings  and recommendations with the patient. If applicable, a reminder letter will be sent to the patient regarding the next appointment. BI-RADS CATEGORY  4: Suspicious. Electronically Signed   By: Sundra Engel M.D.   On: 02/21/2024 15:57   MM 3D SCREENING MAMMOGRAM BILATERAL BREAST Result Date: 02/14/2024 CLINICAL DATA:  Screening. EXAM: DIGITAL SCREENING BILATERAL MAMMOGRAM WITH TOMOSYNTHESIS AND CAD TECHNIQUE: Bilateral screening digital craniocaudal and mediolateral oblique mammograms were obtained. Bilateral screening digital breast tomosynthesis was performed. The images were evaluated with computer-aided detection. COMPARISON:  None. ACR Breast Density Category b: There are scattered areas of fibroglandular density. FINDINGS: In the right breast a possible mass requires further evaluation. In the left  breast 2 possible masses as well as possible abnormal left axillary lymph requires further evaluation. IMPRESSION: Further evaluation is suggested for possible mass in the right breast. Further evaluation is suggested for 2 possible masses as well as possible abnormal left axillary lymph node in the left breast. RECOMMENDATION: Diagnostic mammogram and possibly ultrasound of both breasts. (Code:[id number]-[id number]) The patient will be contacted regarding the findings, and additional imaging will be scheduled. BI-RADS CATEGORY  0: Incomplete: Need additional imaging evaluation. Electronically Signed   By: Roda Cirri M.D.   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 The 2 oclock mass showed a lymph node with metastatic carcinoma and the left axilla also showed metastatic carcinoma. Prognostics revealed ER + 100% strong, PR + 40% weak, HER2 + (3+), and a Ki67 of 35%.  - We discussed the typical nature of HER2 positive breast cancers and the multimodal approach to treatment and the standard of neaodjuvant chemo/HER2 therapy, followed by surgery, radiation, and anti-estrogen. Initially she is not interested in surgery or intensive chemotherapy even as a means to the highest chance of cure. She values quality of life over quantity and has strong faith  -She is elderly with multiple co-morbidities and ambulates with a walker due to chronic back pain. She is not a good candidate for intensive treatment but would likely tolerate lower intensity therapy -We reviewed treatment options and eventually she is open to [place] followed by restaging breast MRI and surgical consult. Will hold on rad onc consult for now pending her decision to undergo surgery -If she achieves complete  radiographic response to neoadjuvant therapy, she may choose not to go on surgery and we would recommend to continue Her2 therapy and AI as appropriate  -We plan to see her back in 2 weeks, to review baseline breast MRI, echo and staging PET and determine treatment start date -Pt seen with Dr. Maryalice Smaller  Bone health -Normal DEXA 12/2016, would repeat before considering anti-estrogen -s/p bilateral knee replacements and chronic spinal stenosis, on opioids per PCP, and ambulates with a walker.  -will consider when prescribing anti-estrogen    PLAN: -Lab today -Pre-treatment breast MRI, baseline echo, and staging PET in 1-2 weeks -Referral to surgery -F/up in 2 weeks to review studies, then determine treatment start date -Chemo class before C1 -Can hold on rad onc referral for now -Pt seen with Dr. Maryalice Smaller  -Discussed with breast navigators for coordination of care/scheduling   Orders Placed This Encounter  Procedures   NM PET Image Initial (PI) Skull Base To Thigh  Standing Status:   Future    Expected Date:   03/15/2024    Expiration Date:   03/08/2025    If indicated for the ordered procedure, I authorize the administration of a radiopharmaceutical per Radiology protocol:   Yes    Preferred imaging location?:   [facility]   MR BREAST BILATERAL W WO CONTRAST INC CAD    Standing Status:   Future    Expected Date:   03/15/2024    Expiration Date:   03/08/2025    If indicated for the ordered procedure, I authorize the administration of contrast media per Radiology protocol:   Yes    What is the patient's sedation requirement?:   No Sedation    Does the patient have a pacemaker or implanted devices?:   No    Preferred imaging location?:   [facility] (table limit - 550 lbs)   CBC with Differential (Cancer Center Only)    Standing Status:   Future    Number of Occurrences:   1    Expiration Date:   03/08/2025   CMP (Cancer Center only)    Standing Status:   Future    Number of  Occurrences:   1    Expiration Date:   03/08/2025   CA 27.29    Standing Status:   Future    Number of Occurrences:   1    Expiration Date:   03/08/2025   Ambulatory referral to General Surgery    Referral Priority:   Routine    Referral Type:   Surgical    Referral Reason:   Specialty Services Required    Requested Specialty:   General Surgery    Number of Visits Requested:   1   ECHOCARDIOGRAM COMPLETE    Standing Status:   Future    Expected Date:   03/15/2024    Expiration Date:   03/08/2025    Where should this test be performed:   [place]    Perflutren DEFINITY (image enhancing agent) should be administered unless hypersensitivity or allergy exist:   Administer Perflutren    Reason for exam-Echo:   Chemo  Z09    Other Comments:   baseline for HER2 therapy    All questions were answered. The patient knows to call the clinic with any problems, questions or concerns.      Ry Moody K Katiana Ruland, NP 03/08/2024    Addendum I have seen the patient, examined her. I agree with the assessment and and plan and have edited the notes.    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  I reviewed the images and biopsy results with the patient in detail.  Given the HER2 positive disease and multiple positive lymph nodes, I standard neoadjuvant chemotherapy TCHP, due to her multiple medical comorbidities and low performance status, she is not a good candidate for intensive chemo and she also strongly prefer more tolerable treatment.  She is very reluctant to consider surgery at this point.  We discussed additional options, I recommended her to try Enhertu, potential benefit and side effects were discussed with her and that she is agreeable.  Will obtain staging PET scan, breast MRI, echocardiogram, and a port placement in next few weeks, and the plan to start treatment in 2 to 3 weeks.  All questions were answered.  I spent a total of 60 minutes for her visit  today.  Sonja Garrett MD 03/08/2024

## 2024-03-09 ENCOUNTER — Encounter: Payer: Self-pay | Admitting: *Deleted

## 2024-03-09 ENCOUNTER — Telehealth: Payer: Self-pay

## 2024-03-09 ENCOUNTER — Telehealth: Payer: Self-pay | Admitting: Hematology

## 2024-03-09 ENCOUNTER — Telehealth: Payer: Self-pay | Admitting: *Deleted

## 2024-03-09 ENCOUNTER — Ambulatory Visit: Payer: Self-pay | Admitting: Family Medicine

## 2024-03-09 LAB — CANCER ANTIGEN 27.29: CA 27.29: 32.5 U/mL (ref 0.0–38.6)

## 2024-03-09 LAB — MICROALBUMIN / CREATININE URINE RATIO
Creatinine, Urine: 118.8 mg/dL
Microalb/Creat Ratio: 166 mg/g{creat} — ABNORMAL HIGH (ref 0–29)
Microalbumin, Urine: 197.4 ug/mL

## 2024-03-09 NOTE — Telephone Encounter (Signed)
Spoke to pt and provided navigation resources and contact information. Denies questions or concerns regarding dx or treatment care plan. Encourage pt to call with needs. Received verbal understanding.

## 2024-03-09 NOTE — Telephone Encounter (Signed)
 Pt called stating she was seen on 03/08/2024 by Lacie Burton, NP and was asked if she had any implanted devices.  Pt stated she said "No" at that time but now remember she had "Spacers" placed in her neck years ago here at [facility] and want to may Lacie Burton, NP aware.  Stated this nurse will notify Lacie of the pt's call.

## 2024-03-10 ENCOUNTER — Encounter: Payer: Self-pay | Admitting: Hematology

## 2024-03-10 MED ORDER — ONDANSETRON HCL 8 MG PO TABS: 8.0000 mg | ORAL_TABLET | Freq: Three times a day (TID) | ORAL | 1 refills | Status: DC | PRN

## 2024-03-10 MED ORDER — PROCHLORPERAZINE MALEATE 10 MG PO TABS: 10.0000 mg | ORAL_TABLET | Freq: Four times a day (QID) | ORAL | 1 refills | Status: DC | PRN

## 2024-03-10 NOTE — Progress Notes (Signed)
 START OFF PATHWAY REGIMEN - Breast   [id number]:Fam-trastuzumab deruxtecan-nxki 5.4 mg/kg IV D1 q21 Days:   A cycle is every 21 days:     Fam-trastuzumab deruxtecan-nxki   **Always confirm dose/schedule in your pharmacy ordering system**  Patient Characteristics: Preoperative or Nonsurgical Candidate, M0 (Clinical Staging), Up to cT4c, Any N, M0, Neoadjuvant Therapy followed by Surgery, Invasive Disease, Chemotherapy, HER2 Positive, ER Positive Therapeutic Status: Preoperative or Nonsurgical Candidate, M0 (Clinical Staging) AJCC M Category: cM0 AJCC Grade: G2 ER Status: Positive (+) AJCC 8 Stage Grouping: IB HER2 Status: Positive (+) AJCC T Category: cT1c AJCC N Category: cN1 PR Status: Positive (+) Breast Surgical Plan: Neoadjuvant Therapy followed by Surgery Intent of Therapy: Curative Intent, Discussed with Patient

## 2024-03-11 LAB — URINE CULTURE

## 2024-03-12 ENCOUNTER — Other Ambulatory Visit: Payer: Self-pay

## 2024-03-14 ENCOUNTER — Other Ambulatory Visit

## 2024-03-14 ENCOUNTER — Encounter

## 2024-03-15 ENCOUNTER — Ambulatory Visit (HOSPITAL_COMMUNITY)
Admission: RE | Admit: 2024-03-15 | Discharge: 2024-03-15 | Disposition: A | Discharge status: Home / Self Care | Source: Ambulatory Visit | Attending: Nurse Practitioner | Admitting: Nurse Practitioner

## 2024-03-15 DIAGNOSIS — C50212 Malignant neoplasm of upper-inner quadrant of left female breast: Secondary | ICD-10-CM | POA: Diagnosis not present

## 2024-03-15 DIAGNOSIS — Z170 Estrogen receptor positive status [ER+]: Secondary | ICD-10-CM | POA: Insufficient documentation

## 2024-03-15 DIAGNOSIS — R928 Other abnormal and inconclusive findings on diagnostic imaging of breast: Secondary | ICD-10-CM | POA: Diagnosis not present

## 2024-03-15 MED ORDER — GADOBUTROL 1 MMOL/ML IV SOLN
9.0000 mL | Freq: Once | INTRAVENOUS | Status: AC | PRN
Start: 2024-03-15 — End: 2024-03-15
  Administered 2024-03-15: 9 mL via INTRAVENOUS

## 2024-03-16 ENCOUNTER — Encounter: Payer: Self-pay | Admitting: *Deleted

## 2024-03-16 DIAGNOSIS — C50212 Malignant neoplasm of upper-inner quadrant of left female breast: Secondary | ICD-10-CM

## 2024-03-17 ENCOUNTER — Ambulatory Visit (HOSPITAL_COMMUNITY)
Admission: RE | Admit: 2024-03-17 | Discharge: 2024-03-17 | Disposition: A | Discharge status: Home / Self Care | Source: Ambulatory Visit | Attending: Nurse Practitioner | Admitting: Nurse Practitioner

## 2024-03-17 DIAGNOSIS — E119 Type 2 diabetes mellitus without complications: Secondary | ICD-10-CM | POA: Insufficient documentation

## 2024-03-17 DIAGNOSIS — Z170 Estrogen receptor positive status [ER+]: Secondary | ICD-10-CM | POA: Diagnosis not present

## 2024-03-17 DIAGNOSIS — I10 Essential (primary) hypertension: Secondary | ICD-10-CM | POA: Diagnosis not present

## 2024-03-17 DIAGNOSIS — C50212 Malignant neoplasm of upper-inner quadrant of left female breast: Secondary | ICD-10-CM | POA: Diagnosis not present

## 2024-03-17 LAB — ECHOCARDIOGRAM COMPLETE
AR max vel: 1.61 cm2
AV Area VTI: 1.64 cm2
AV Area mean vel: 1.29 cm2
AV Mean grad: 4 mmHg
AV Peak grad: 8.1 mmHg
Ao pk vel: 1.42 m/s
Area-P 1/2: 3.42 cm2
Calc EF: 72.8 %
S' Lateral: 2.6 cm
Single Plane A2C EF: 75.4 %
Single Plane A4C EF: 68.1 %

## 2024-03-17 MED ORDER — PERFLUTREN LIPID MICROSPHERE
1.0000 mL | INTRAVENOUS | Status: AC | PRN
  Administered 2024-03-17: 2 mL via INTRAVENOUS

## 2024-03-17 NOTE — Progress Notes (Signed)
*  PRELIMINARY RESULTS* Echocardiogram 2D Echocardiogram has been performed.  Yamir Carignan D Shishir Krantz 03/17/2024, 12:39 PM

## 2024-03-20 ENCOUNTER — Other Ambulatory Visit: Payer: Self-pay | Admitting: Nurse Practitioner

## 2024-03-20 ENCOUNTER — Ambulatory Visit (HOSPITAL_COMMUNITY)
Admission: RE | Admit: 2024-03-20 | Discharge: 2024-03-20 | Disposition: A | Discharge status: Home / Self Care | Source: Ambulatory Visit | Attending: Nurse Practitioner | Admitting: Nurse Practitioner

## 2024-03-20 ENCOUNTER — Encounter: Payer: Self-pay | Admitting: *Deleted

## 2024-03-20 ENCOUNTER — Other Ambulatory Visit: Payer: Self-pay | Admitting: *Deleted

## 2024-03-20 DIAGNOSIS — Z170 Estrogen receptor positive status [ER+]: Secondary | ICD-10-CM | POA: Diagnosis not present

## 2024-03-20 DIAGNOSIS — C50212 Malignant neoplasm of upper-inner quadrant of left female breast: Secondary | ICD-10-CM | POA: Diagnosis not present

## 2024-03-20 DIAGNOSIS — I10 Essential (primary) hypertension: Secondary | ICD-10-CM

## 2024-03-20 DIAGNOSIS — C50912 Malignant neoplasm of unspecified site of left female breast: Secondary | ICD-10-CM | POA: Diagnosis not present

## 2024-03-20 LAB — GLUCOSE, CAPILLARY: Glucose-Capillary: 172 mg/dL — ABNORMAL HIGH (ref 70–99)

## 2024-03-20 MED ORDER — FLUDEOXYGLUCOSE F - 18 (FDG) INJECTION
9.7600 | Freq: Once | INTRAVENOUS | Status: AC
Start: 2024-03-20 — End: 2024-03-20
  Administered 2024-03-20: 9.76 via INTRAVENOUS

## 2024-03-20 NOTE — Patient Outreach (Signed)
 Complex Care Management  [employer] Plan Visit Note  03/20/2024  Name:  [name] MRN: [id number] DOB: 1950-11-21  Situation: Referral received for Complex Care Management related to diabetes. I obtained verbal consent from Patient.  Visit completed with Thomasena Fleming  on the phone  Background:   Past Medical History:  Diagnosis Date   Acute MEE (middle ear effusion) 06/07/2014   Acute recurrent sinusitis 12/21/2014   Acute sinusitis 12/21/2014   Allergy    Anemia    hx   Asthma, intermittent 11/02/2018   ATTENTION DEFICIT, W/O HYPERACTIVITY 12/16/2006   Bilateral carpal tunnel syndrome 11/20/2016   Cataract    Cervical spondylosis with myelopathy, History of 04/28/2012   COLON POLYP 12/16/2006   (02/03/12) Surgical [Pathology], sigmoid colon, polyp HYPERPLASTIC POLYP(S) AND POLYPOID FRAGMETNS OF BENIGN COLONIC MUCOSA WITH INTRAMUCOSAL LYMPHOID AGGREGATES. NO ADENOMATOUS CHANGE OR MALIGNANCY IDENTIFIED.    DIABETES MELLITUS II, UNCOMPLICATED 12/16/2006   Fall 10/18/2020   GERD (gastroesophageal reflux disease)    H/O abnormal mammogram 07/19/2009   S/P Breast Biopsy [facility], [place], 07/2009): Benign findings.     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/20/2016   OSTEOARTHRITIS, MULTI SITES 12/16/2006   Perennial allergic rhinitis with seasonal variation 12/16/2006        PONV (postoperative nausea and  vomiting)    last surgery only   Recurrent maxillary sinusitis    SACROILIITIS, HISTORY OF 01/08/2009   Qualifier: History of  By: McDiarmid MD, Todd     Seasonal allergies    Seborrheic keratosis 01/15/2016   SKELETAL HYPEROSTOSIS 03/29/2008   Ulcer    Uterine fibroid 02/02/2011   TVUS: 4cm fibroid w/ submucosal component, bil.hydrosalpinges, unable measurable endometrium - 08/18/2004     Assessment: Patient Reported Symptoms:  Cognitive Cognitive Status: Alert and oriented to person, place, and time, Normal speech and language skills Cognitive/Intellectual Conditions Management [RPT]: None reported or documented in medical history or problem list   Health Maintenance Behaviors: Annual physical exam, Healthy diet, Sleep adequate, Immunizations Healing Pattern: Unsure Health Facilitated by: Healthy diet, Rest  Neurological Neurological Review of Symptoms: No symptoms reported Neurological Conditions:  (diabetic peripheral neuropathy) Neurological Management Strategies: Medication therapy Neurological Self-Management Outcome: 3 (uncertain) Neurological Comment: does not like to do finger stick blood sugar checks because her fingers are already painful due to peripheral neuropathy  HEENT HEENT Symptoms Reported: No symptoms reported      Cardiovascular Cardiovascular Symptoms Reported: No symptoms reported Does patient have uncontrolled Hypertension?: No Cardiovascular Conditions: Hypertension Cardiovascular Management Strategies: Medication therapy, Routine screening Cardiovascular Self-Management Outcome: 4 (good)  Respiratory Respiratory Symptoms Reported: No symptoms reported    Endocrine Patient reports the following symptoms related to hypoglycemia or hyperglycemia : No symptoms reported Is patient diabetic?: Yes Is patient checking blood sugars at home?: No Endocrine Conditions: Diabetes, Other Other Endocrine Conditions: malignant neoplasm left breast Endocrine  Management Strategies: Medication therapy, Routine screening, Diet modification, Coping strategies Endocrine Self-Management Outcome: 4 (good) Endocrine Comment: Working with [facility]. PET scan today. Waiting for results.  Gastrointestinal Gastrointestinal Symptoms Reported: No symptoms reported Gastrointestinal Conditions: Reflux/heartburn Gastrointestinal Management Strategies: Diet modification, Medication therapy Gastrointestinal Self-Management Outcome: 4 (good)    Genitourinary Genitourinary Symptoms Reported: No symptoms reported Genitourinary Conditions: Chronic kidney disease, Yeast infection Genitourinary Management Strategies: Medication therapy (routine screening) Genitourinary Self-Management Outcome: 3 (uncertain) Genitourinary Comment: Has diflucan  to take PRN yeast infection  Integumentary Integumentary Symptoms Reported: No symptoms reported    Musculoskeletal Musculoskelatal Symptoms Reviewed: No symptoms reported Musculoskeletal Conditions: Osteoarthritis, Back pain Musculoskeletal Management Strategies: Medication therapy Musculoskeletal Self-Management Outcome: 3 (uncertain) Falls in the past year?: No Number of falls in past year: 1 or less Was there an injury with Fall?: No Fall Risk Category Calculator: 0 Patient Fall Risk Level: Low Fall Risk    Psychosocial Psychosocial Symptoms Reported: No symptoms reported Behavioral Health Conditions: ADHD/ADD Behavioral Management Strategies: Medication therapy Behavioral Health Self-Management Outcome: 3 (uncertain) Behavioral Health Comment: recent diagnosis of breast cancer. PET scan done today. Waiting on results. has not started treatments yet. Upcoming appointments with oncology team scheduled. Reports good family support. Major Change/Loss/Stressor/Fears (CP): Medical condition, self Techniques to Cope with Loss/Stress/Change: None Quality of Family Relationships: helpful, involved, supportive       03/20/2024    3:24 PM  Depression screen PHQ 2/9  Decreased Interest 0  Down, Depressed, Hopeless 0  PHQ - 2 Score 0    There were no vitals filed for this visit.  Medications Reviewed Today     Reviewed by Ethelene Herald, RN (Registered Nurse) on 03/20/24 at 1526  Med List Status: <None>   Medication Order Taking? Sig Documenting Provider Last Dose Status Informant  amLODipine -benazepril  (LOTREL) 10-20 MG capsule [id number]  TAKE 1 CAPSULE BY MOUTH DAILY McDiarmid, Demetra Filter, MD  Active   atorvastatin  (LIPITOR) 40 MG tablet [id number]  Take 1 tablet (40 mg total) by mouth daily. McDiarmid, Demetra Filter, MD  Active            Med Note Ples Brim, CARROLL   Tue Dec 01, 2022  1:05 PM) Taking every few days.  blood glucose meter kit and supplies KIT [id number]  Dispense based on patient and insurance preference. Use up to four times daily as directed. Genora Kidd, MD  Active   Blood Glucose Monitoring Suppl DEVI [id number]  1 each by Does not apply route in the morning, at noon, and at bedtime. May substitute to any manufacturer covered by patient's insurance. Genora Kidd, MD  Active   carvedilol  (COREG ) 6.25 MG tablet [id number]  TAKE 1 TABLET(6.25 MG) BY MOUTH TWICE DAILY WITH A MEAL McDiarmid, Demetra Filter, MD  Active   fluconazole  (DIFLUCAN ) 150 MG tablet [id number]  TAKE 1 TABLET BY MOUTH, THEN AGAIN IN 3 DAYS McDiarmid, Demetra Filter, MD  Active   furosemide  (LASIX ) 20 MG tablet [id number]  Take 1 tablet (20 mg total) by mouth every other day. McDiarmid, Demetra Filter, MD  Active   gabapentin  (NEURONTIN ) 100 MG capsule [id number]  TAKE 1 CAPSULE(100 MG) BY MOUTH THREE TIMES DAILY AS NEEDED McDiarmid, Demetra Filter, MD  Active   hydrochlorothiazide  (HYDRODIURIL ) 25 MG tablet [id number]  TAKE 1 TABLET(25 MG) BY MOUTH DAILY McDiarmid, Demetra Filter, MD  Active   HYDROcodone -acetaminophen  (NORCO) 7.5-325 MG tablet [id number]  Take 1 tablet by mouth every 8 (eight) hours as needed for moderate pain (pain  score 4-6). Candee Cha, MD  Active   metFORMIN  (GLUCOPHAGE -XR) 500 MG 24 hr tablet [id number]  TAKE 1 TABLET(500 MG) BY MOUTH TWICE DAILY WITH A MEAL McDiarmid, Demetra Filter, MD  Active   methylphenidate  (RITALIN ) 10 MG tablet [id number]  Take 2 tablets (20 mg total) by mouth 3 (three) times daily with meals. Candee Cha, MD  Active   naproxen  (NAPROSYN ) 500 MG tablet [id number]  TAKE 1 TABLET(500 MG) BY MOUTH TWICE DAILY WITH A MEAL McDiarmid, Demetra Filter, MD  Active   ondansetron  (ZOFRAN ) 8 MG tablet [id number]  Take 1 tablet (8 mg total) by mouth every 8 (eight) hours as needed for nausea or vomiting. Start on the third day after chemotherapy. Sonja [place], MD  Active   OVER THE COUNTER MEDICATION [id number]  Store brand Ducolax stool softener. One capsule daily. [provider]  Active   prochlorperazine  (COMPAZINE ) 10 MG tablet [id number]  Take 1 tablet (10 mg total) by mouth every 6 (six) hours as needed for nausea or vomiting. Sonja [place], MD  Active   vitamin B-12 (CYANOCOBALAMIN) 1000 MCG tablet [id number]  Take 1 tablet (1,000 mcg total) by mouth daily. McDiarmid, Demetra Filter, MD  Active             Recommendation:   Continue Current Plan of Care Reach out to provider with any new or worsening symptoms  Follow Up Plan:   Telephone follow up appointment date/time:  04/17/24 at 3:30  Michele Ahle, RN, BSN [place]  [facility] RN Care Manager Direct Dial: [phone]  Fax: [phone]

## 2024-03-20 NOTE — Patient Instructions (Signed)
 Visit Information  Thank you for taking time to visit with me today. Please don't hesitate to contact me if I can be of assistance to you before our next scheduled appointment.  Your next care management appointment is by telephone on 04/17/24 at 3:30  Please call the care guide team at [phone] if you need to cancel, schedule, or reschedule an appointment.   Please call 911 if you are experiencing a Mental Health or Behavioral Health Crisis or need someone to talk to.  Michele Ahle, RN, BSN [place]  [facility] RN Care Manager Direct Dial: [phone]  Fax: [phone]

## 2024-03-21 ENCOUNTER — Ambulatory Visit: Admitting: Hematology and Oncology

## 2024-03-22 ENCOUNTER — Encounter: Payer: Self-pay | Admitting: *Deleted

## 2024-03-22 ENCOUNTER — Inpatient Hospital Stay: Admitting: Pharmacist

## 2024-03-22 ENCOUNTER — Inpatient Hospital Stay: Attending: Nurse Practitioner | Admitting: Hematology

## 2024-03-22 ENCOUNTER — Inpatient Hospital Stay

## 2024-03-22 ENCOUNTER — Encounter: Payer: Self-pay | Admitting: Hematology

## 2024-03-22 VITALS — BP 140/72 | HR 96 | Temp 97.2°F | Resp 17 | Ht 62.0 in | Wt 198.4 lb

## 2024-03-22 DIAGNOSIS — C50212 Malignant neoplasm of upper-inner quadrant of left female breast: Secondary | ICD-10-CM | POA: Diagnosis not present

## 2024-03-22 DIAGNOSIS — Z1731 Human epidermal growth factor receptor 2 positive status: Secondary | ICD-10-CM | POA: Insufficient documentation

## 2024-03-22 DIAGNOSIS — Z5112 Encounter for antineoplastic immunotherapy: Secondary | ICD-10-CM | POA: Diagnosis not present

## 2024-03-22 DIAGNOSIS — Z170 Estrogen receptor positive status [ER+]: Secondary | ICD-10-CM

## 2024-03-22 DIAGNOSIS — Z1721 Progesterone receptor positive status: Secondary | ICD-10-CM | POA: Insufficient documentation

## 2024-03-22 NOTE — Progress Notes (Signed)
 [facility]   [phone] Fax:([phone]   Clinic Follow up Note   Patient Care Team: McDiarmid, Demetra Filter, MD as PCP - General Rudine Cos, MD as Consulting Physician (Ophthalmology) Garry Kansas, MD as Consulting Physician (Neurosurgery) Clair Crews, MD as Consulting Physician (Ophthalmology) Ethelene Herald, RN as [facility] Management Auther Bo, RN as Oncology Nurse Navigator Alane Hsu, RN as Oncology Nurse Navigator Sonja Nashua, MD as Consulting Physician (Hematology)  Date of Service:  03/22/2024  CHIEF COMPLAINT: f/u of left breast cancer  CURRENT THERAPY:  Pending neoadjuvant Enhertu  Oncology History   Malignant neoplasm of upper-inner quadrant of left breast in female, estrogen receptor positive (HCC) -cT1cN1M0, ER+/PR+/HER2+ -presented with screening discovered left breast cancer with nodal metastasis.  - Breast MRI showed 2.5 cm mass in the upper inner quadrant of left breast, additional suspicious clumped linear non-mass enhancement in the retroareolar left measuring 5 cm, and additional small area of non-mass enhancement in the lateral left breast spanning 1.1 cm, 1.3 centimeter abnormal lymph node in the left low axilla, no additional abnormal lymph nodes.  Assessment & Plan Breast cancer with lymph node involvement PET scan confirms lymph node involvement without distant metastasis. Breast MRI shows the known tumor and axillary lymph node, plus two additional non-mass enhancements in the left breast requiring further investigation. She is HER2 positive, and Enhertu is recommended. Enhertu, an antibody-drug conjugate, combines targeted therapy with chemotherapy, administered every three weeks. Potential side effects will be monitored. Follow-up imaging is planned after three months to assess tumor reduction. Surgery is a potential option if chemotherapy is insufficient, but she currently prefers to avoid it. Additional  biopsy may not alter the treatment plan but will provide more information, especially if surgery is considered later. - Order MRI-guided needle biopsy of the two abnormal areas in the left breast. - Administer Enhertu every three weeks. - Schedule follow-up imaging after three months of treatment. - Discuss potential surgical options based on biopsy results and treatment response. - Request port placement for chemotherapy administration. - Schedule blood tests during the next treatment visit. - Coordinate with the breast center for biopsy scheduling.  HTN, DM, neuropathy, spinal stenosis - Follow-up with PCP  Plan - I reviewed her PET scan and breast MRI findings - Will schedule MRI guided left breast biopsy - Port placement by IR - Plan to start Enhertu next week - Toxicity checkup 1 week after first cycle   SUMMARY OF ONCOLOGIC HISTORY: Oncology History  Malignant neoplasm of upper-inner quadrant of left breast in female, estrogen receptor positive (HCC)  02/25/2024 Cancer Staging   Staging form: Breast, AJCC 8th Edition - Clinical stage from 02/25/2024: Stage IB (cT1c, cN1, cM0, G3, ER+, PR+, HER2+) - Signed by Sonja [place], MD on 03/07/2024 Stage prefix: Initial diagnosis Histologic grading system: 3 grade system   03/07/2024 Initial Diagnosis   Malignant neoplasm of upper-inner quadrant of left breast in female, estrogen receptor positive (HCC)   03/29/2024 -  Chemotherapy   Patient is on Treatment Plan : BREAST Fam-Trastuzumab Deruxtecan-nxki (Enhertu) (5.4) q21d        Discussed the use of AI scribe software for clinical note transcription with the patient, who gave verbal consent to proceed.  History of Present Illness [name] is a 73 year old female with breast cancer who presents for follow-up.  She attended a chemotherapy class and found it helpful. Since her last visit, there are no new symptoms or changes  in her condition. She experiences no breast pain or  issues.  A recent PET scan shows no metastasis outside the breast. An echocardiogram on May 30th indicates normal heart function with an ejection fraction of 60-65%. An MRI reveals the known tumor in the breast, a lymph node in the armpit, and two additional abnormal areas in the left breast described as non-mass enhancements. These areas are not visible on ultrasound, necessitating an MRI-guided biopsy.  She has a history of a previous needle biopsy confirming cancer. She has picked up her nausea medication in preparation for treatment.     All other systems were reviewed with the patient and are negative.  MEDICAL HISTORY:  Past Medical History:  Diagnosis Date   Acute MEE (middle ear effusion) 06/07/2014   Acute recurrent sinusitis 12/21/2014   Acute sinusitis 12/21/2014   Allergy    Anemia    hx   Asthma, intermittent 11/02/2018   ATTENTION DEFICIT, W/O HYPERACTIVITY 12/16/2006   Bilateral carpal tunnel syndrome 11/20/2016   Cataract    Cervical spondylosis with myelopathy, History of 04/28/2012   COLON POLYP 12/16/2006   (02/03/12) Surgical [Pathology], sigmoid colon, polyp HYPERPLASTIC POLYP(S) AND POLYPOID FRAGMETNS OF BENIGN COLONIC MUCOSA WITH INTRAMUCOSAL LYMPHOID AGGREGATES. NO ADENOMATOUS CHANGE OR MALIGNANCY IDENTIFIED.    DIABETES MELLITUS II, UNCOMPLICATED 12/16/2006   Fall 10/18/2020   GERD (gastroesophageal reflux disease)    H/O abnormal mammogram 07/19/2009   S/P Breast Biopsy [facility], [place], 07/2009): Benign findings.     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/20/2016   OSTEOARTHRITIS, MULTI SITES 12/16/2006   Perennial allergic rhinitis with seasonal variation 12/16/2006        PONV (postoperative nausea and vomiting)    last surgery only   Recurrent maxillary sinusitis    SACROILIITIS, HISTORY OF 01/08/2009   Qualifier: History of  By: McDiarmid MD, Todd     Seasonal allergies    Seborrheic keratosis 01/15/2016   SKELETAL HYPEROSTOSIS 03/29/2008   Ulcer    Uterine fibroid 02/02/2011   TVUS: 4cm fibroid w/ submucosal component, bil.hydrosalpinges, unable measurable endometrium - 08/18/2004     SURGICAL HISTORY: Past Surgical History:  Procedure Laterality Date   ANTERIOR CERVICAL DECOMP/DISCECTOMY FUSION  04/28/2012   Procedure: ANTERIOR CERVICAL DECOMPRESSION/DISCECTOMY FUSION 3 LEVELS;  Surgeon: Elder Greening, MD;  Location: [facility];  Service: Neurosurgery;  Laterality: N/A;  Cervical three-four,Cervical four-five,Cervical five-six anterior cervical decompression with fusion interbody prothesis plating and bonegraft   BREAST BIOPSY  07/19/2009   S/P Breast Biopsy [facility], [place], 07/2009): Benign findings.  (10/27/2010)   BREAST BIOPSY Left 02/25/2024   US  LT BREAST BX W LOC DEV 1ST LESION IMG BX SPEC US  GUIDE 02/25/2024 [facility]   BREAST BIOPSY Left 02/25/2024   US  LT BREAST BX W LOC DEV EA ADD LESION IMG BX SPEC US  GUIDE 02/25/2024 [facility]  BREAST BIOPSY Right 03/02/2024   US  RT BREAST BX W LOC DEV 1ST LESION IMG BX SPEC US  GUIDE 03/02/2024 [facility]   BREAST LUMPECTOMY Left    CARPAL TUNNEL RELEASE Left 12/10/2016   Procedure: LEFT CARPAL TUNNEL RELEASE;  Surgeon: Lyanne Sample, MD;  Location: [facility];  Service: Orthopedics;  Laterality: Left;   COLONOSCOPY W/ POLYPECTOMY  12/17/2000   colonoscopy (Dr Tova Fresh)  int. hemorrhoids &  nonneoplatic colon polyps - 01/10/2001,    COLONOSCOPY W/ POLYPECTOMY  01/18/2012   Dr Bridgett Camps (GI).sigmoid colon, hyperlastic polyp   ENDOMETRIAL BIOPSY  08/19/2004   Endometrial Biopsy - 09/01/2004,   NM MYOVIEW  LTD  04/18/2004   Cardiolite:EF63%, no ischemia - 05/01/2004,    REPLACEMENT TOTAL KNEE BILATERAL      I have reviewed the social history and family history with the patient and they are unchanged from previous note.  ALLERGIES:  is allergic to januvia  [sitagliptin ], jardiance  [empagliflozin ], amlodipine , celecoxib, and diclofenac-misoprostol.  MEDICATIONS:  Current Outpatient Medications  Medication Sig Dispense Refill   amLODipine -benazepril  (LOTREL) 10-20 MG capsule TAKE 1 CAPSULE BY MOUTH DAILY 90 capsule 3   Apoaequorin (PREVAGEN PO) Take by mouth.     atorvastatin  (LIPITOR) 40 MG tablet Take 1 tablet (40 mg total) by mouth daily. 90 tablet 3   blood glucose meter kit and supplies KIT Dispense based on patient and insurance preference. Use up to four times daily as directed. 1 each 0   Blood Glucose Monitoring Suppl DEVI 1 each by Does not apply route in the morning, at noon, and at bedtime. May substitute to any manufacturer covered by patient's insurance. 1 each 0   carvedilol  (COREG ) 6.25 MG tablet TAKE 1 TABLET(6.25 MG) BY MOUTH TWICE DAILY WITH A MEAL 180 tablet 3   fexofenadine  (ALLEGRA ) 180 MG tablet Take 180 mg by mouth daily.     fluconazole  (DIFLUCAN ) 150 MG tablet TAKE 1 TABLET BY MOUTH, THEN AGAIN IN 3 DAYS 2 tablet 3   gabapentin  (NEURONTIN ) 100 MG capsule TAKE 1 CAPSULE(100 MG) BY MOUTH THREE TIMES DAILY AS NEEDED 270 capsule 1   hydrochlorothiazide  (HYDRODIURIL ) 25 MG tablet TAKE 1 TABLET(25 MG) BY MOUTH DAILY 90 tablet 3   HYDROcodone -acetaminophen  (NORCO) 7.5-325 MG tablet Take 1 tablet by mouth every 8 (eight) hours as needed for moderate pain (pain score 4-6). 90 tablet 0   metFORMIN  (GLUCOPHAGE -XR) 500 MG 24 hr tablet TAKE 1 TABLET(500  MG) BY MOUTH TWICE DAILY WITH A MEAL 180 tablet 3   methylphenidate  (RITALIN ) 10 MG tablet Take 2 tablets (20 mg total) by mouth 3 (three) times daily with meals. 180 tablet 0   naproxen  (NAPROSYN ) 500 MG tablet TAKE 1 TABLET(500 MG) BY MOUTH TWICE DAILY WITH A MEAL 180 tablet 3   ondansetron  (ZOFRAN ) 8 MG tablet Take 1 tablet (8 mg total) by mouth every 8 (eight) hours as needed for nausea or vomiting. Start on the third day after chemotherapy. 30 tablet 1   OVER THE COUNTER MEDICATION Store brand Ducolax stool softener. One capsule daily.     prochlorperazine  (COMPAZINE ) 10 MG tablet Take 1 tablet (10 mg total) by mouth every 6 (six) hours as needed for nausea or vomiting. 30 tablet 1   vitamin B-12 (CYANOCOBALAMIN) 1000 MCG tablet Take 1 tablet (1,000 mcg total) by mouth daily. 180 tablet 3   No current facility-administered medications for this visit.    PHYSICAL EXAMINATION: ECOG PERFORMANCE STATUS: 1 - Symptomatic but completely ambulatory  Vitals:  03/22/24 1603 03/22/24 1605  BP: (!) 150/64 (!) 140/72  Pulse: 96   Resp: 17   Temp: (!) 97.2 F (36.2 C)   SpO2: 99%    Wt Readings from Last 3 Encounters:  03/22/24 198 lb 6.4 oz (90 kg)  03/08/24 197 lb 9.6 oz (89.6 kg)  03/07/24 200 lb (90.7 kg)     GENERAL:alert, no distress and comfortable SKIN: skin color, texture, turgor are normal, no rashes or significant lesions EYES: normal, Conjunctiva are pink and non-injected, sclera clear NECK: supple, thyroid  normal size, non-tender, without nodularity LYMPH:  no palpable lymphadenopathy in the cervical, axillary  LUNGS: clear to auscultation and percussion with normal breathing effort HEART: regular rate & rhythm and no murmurs and no lower extremity edema ABDOMEN:abdomen soft, non-tender and normal bowel sounds Musculoskeletal:no cyanosis of digits and no clubbing  NEURO: alert & oriented x 3 with fluent speech, no focal motor/sensory deficits  Physical  Exam    LABORATORY DATA:  I have reviewed the data as listed    Latest Ref Rng & Units 03/08/2024   11:40 AM 03/07/2020    1:53 PM 06/05/2013    3:42 PM  CBC  WBC 4.0 - 10.5 K/uL 8.4  6.2  6.1   Hemoglobin 12.0 - 15.0 g/dL 16.1  09.6  04.5   Hematocrit 36.0 - 46.0 % 34.9  35.5  38.6   Platelets 150 - 400 K/uL 352  289  347         Latest Ref Rng & Units 03/08/2024   11:40 AM 01/21/2023    2:45 PM 11/26/2022    2:10 PM  CMP  Glucose 70 - 99 mg/dL 409  811  914   BUN 8 - 23 mg/dL 22  22  19    Creatinine 0.44 - 1.00 mg/dL 7.82  9.56  2.13   Sodium 135 - 145 mmol/L 138  137  139   Potassium 3.5 - 5.1 mmol/L 4.3  4.3  4.0   Chloride 98 - 111 mmol/L 101  97  99   CO2 22 - 32 mmol/L 27  21  20    Calcium  8.9 - 10.3 mg/dL 9.8  9.6  9.6   Total Protein 6.5 - 8.1 g/dL 8.0   7.1   Total Bilirubin 0.0 - 1.2 mg/dL 0.3   <0.8   Alkaline Phos 38 - 126 U/L 68   79   AST 15 - 41 U/L 15   16   ALT 0 - 44 U/L 18   14       RADIOGRAPHIC STUDIES: I have personally reviewed the radiological images as listed and agreed with the findings in the report. No results found.    Orders Placed This Encounter  Procedures   MR BIOPSY/WIRE LOCALIZATION    Standing Status:   Future    Expected Date:   03/29/2024    Expiration Date:   03/22/2025    Reason for Exam (SYMPTOM  OR DIAGNOSIS REQUIRED):   biopsy the two NME in left breast to rule out malignancy    What is the patient's sedation requirement?:   No Sedation    Does the patient have a pacemaker or implanted devices?:   No    Preferred Imaging Location?:   [facility] (table limit-550lbs)    Release to patient:   Immediate   CBC with Differential (Cancer Center Only)    Standing Status:   Future    Expected Date:   04/19/2024  Expiration Date:   04/19/2025   CMP (Cancer Center only)    Standing Status:   Future    Expected Date:   04/19/2024    Expiration Date:   04/19/2025   All questions were answered. The patient knows to call the  clinic with any problems, questions or concerns. No barriers to learning was detected. The total time spent in the appointment was 40 minutes, including review of chart and various tests results, discussions about plan of care and coordination of care plan     Sonja Auberry, MD 03/22/2024

## 2024-03-22 NOTE — Progress Notes (Signed)
 [facility]       Telephone: [phone]?Fax: [phone]   Oncology Clinical Pharmacist Practitioner Initial Assessment  [name] is a 73 y.o. female with a diagnosis of breast cancer. They were contacted today via in-person visit. She is accompanied by her husband [name].   Indication/Regimen Fam-trastuzumab deruxtecan-nxki  (Enhertu) is being used appropriately for treatment of breast cancer by Dr. Sonja [place]. Patient is not wanting surgery and was concerned about standard of care chemotherapy with [facility] or [facility]. Dr. Maryalice Smaller is aware that this regimen is not yet approved or recommended by NCCN/ASCO in this setting. Patient was made aware of this as well today.       Wt Readings from Last 1 Encounters:  03/08/24 197 lb 9.6 oz (89.6 kg)    Estimated body surface area is 1.98 meters squared as calculated from the following:   Height as of 02/28/24: 5\' 2"  (1.575 m).   Weight as of 03/08/24: 197 lb 9.6 oz (89.6 kg).  The dosing regimen is every 21 days until disease progression or unacceptable toxicity  Fam-trastuzumab deruxtecan-nxki (5.4 mg/kg) on Day 1  Dose Modifications Dr. Maryalice Smaller has removed the dexamethasone  prescription since patient is diabetic   Allergies Allergies  Allergen Reactions   Januvia  [Sitagliptin ] Other (See Comments)    Epigastric abdominal pain   Jardiance  [Empagliflozin ] Other (See Comments)    Lightheadedness, near-syncope, extreme yeast problems   Amlodipine  Other (See Comments)    Leg edema   Celecoxib Palpitations    REACTION: palpitations   Diclofenac-Misoprostol Palpitations    REACTION: palpitation    Vitals: No vitals or labs were done today for this chemotherapy education visit     03/08/2024    9:30 AM 03/08/2024    9:29 AM 03/07/2024   10:50 AM  Oncology Vitals  Weight  89.631 kg 90.719 kg  Weight (lbs)  197 lbs 10 oz 200 lbs  BMI  36.14 kg/m2 36.58 kg/m2  Temp  98.3 F (36.8 C) 98.2 F (36.8 C)  Pulse Rate  94 84   BP 138/68 150/72 141/80  Resp  20   SpO2  99 % 98 %  BSA (m2)  1.98 m2 1.99 m2    Laboratory Data    Latest Ref Rng & Units 03/08/2024   11:40 AM 03/07/2020    1:53 PM 06/05/2013    3:42 PM  CBC EXTENDED  WBC 4.0 - 10.5 K/uL 8.4  6.2  6.1   RBC 3.87 - 5.11 MIL/uL 4.38  4.35  4.94   Hemoglobin 12.0 - 15.0 g/dL 56.4  33.2  95.1   HCT 36.0 - 46.0 % 34.9  35.5  38.6   Platelets 150 - 400 K/uL 352  289  347   NEUT# 1.7 - 7.7 K/uL 5.0   3.4   Lymph# 0.7 - 4.0 K/uL 2.6   2.1        Latest Ref Rng & Units 03/08/2024   11:40 AM 01/21/2023    2:45 PM 11/26/2022    2:10 PM  CMP  Glucose 70 - 99 mg/dL 884  166  063   BUN 8 - 23 mg/dL 22  22  19    Creatinine 0.44 - 1.00 mg/dL 0.16  0.10  9.32   Sodium 135 - 145 mmol/L 138  137  139   Potassium 3.5 - 5.1 mmol/L 4.3  4.3  4.0   Chloride 98 - 111 mmol/L 101  97  99   CO2  22 - 32 mmol/L 27  21  20    Calcium  8.9 - 10.3 mg/dL 9.8  9.6  9.6   Total Protein 6.5 - 8.1 g/dL 8.0   7.1   Total Bilirubin 0.0 - 1.2 mg/dL 0.3   <4.0   Alkaline Phos 38 - 126 U/L 68   79   AST 15 - 41 U/L 15   16   ALT 0 - 44 U/L 18   14    No results found for: "MG" Lab Results  Component Value Date   CA2729 32.5 03/08/2024     Contraindications Contraindications were reviewed? Yes Contraindications to therapy were identified? No   Safety Precautions The following safety precautions for the use of fam-trastuzumab deruxtecan-nxki were reviewed:  Fever/neutropenia: reviewed the importance of having a thermometer and the Centers for Disease Control and Prevention (CDC) definition of fever which is 100.23F (38C) or higher. Patient should call 24/7 triage at [phone] if experiencing a fever or any other symptoms Decreased hemoglobin Decreased platelet count Nausea / Vomiting Liver function test elevations Fatigue Alopecia Electrolyte abnormalities Constipation Diarrhea Pain Decreased appetite Interstitial Lung Disease (ILD): reviewed possible symptoms  which include cough, shortness of breast, or fever Left Ventricular Dysfunction Handling body fluids and waste Intimacy, sexual activity, contraception, and fertility  Medication Reconciliation Current Outpatient Medications  Medication Sig Dispense Refill   amLODipine -benazepril  (LOTREL) 10-20 MG capsule TAKE 1 CAPSULE BY MOUTH DAILY 90 capsule 3   Apoaequorin (PREVAGEN PO) Take by mouth.     carvedilol  (COREG ) 6.25 MG tablet TAKE 1 TABLET(6.25 MG) BY MOUTH TWICE DAILY WITH A MEAL 180 tablet 3   fexofenadine  (ALLEGRA ) 180 MG tablet Take 180 mg by mouth daily.     gabapentin  (NEURONTIN ) 100 MG capsule TAKE 1 CAPSULE(100 MG) BY MOUTH THREE TIMES DAILY AS NEEDED 270 capsule 1   hydrochlorothiazide  (HYDRODIURIL ) 25 MG tablet TAKE 1 TABLET(25 MG) BY MOUTH DAILY 90 tablet 3   HYDROcodone -acetaminophen  (NORCO) 7.5-325 MG tablet Take 1 tablet by mouth every 8 (eight) hours as needed for moderate pain (pain score 4-6). 90 tablet 0   metFORMIN  (GLUCOPHAGE -XR) 500 MG 24 hr tablet TAKE 1 TABLET(500 MG) BY MOUTH TWICE DAILY WITH A MEAL 180 tablet 3   methylphenidate  (RITALIN ) 10 MG tablet Take 2 tablets (20 mg total) by mouth 3 (three) times daily with meals. 180 tablet 0   naproxen  (NAPROSYN ) 500 MG tablet TAKE 1 TABLET(500 MG) BY MOUTH TWICE DAILY WITH A MEAL 180 tablet 3   vitamin B-12 (CYANOCOBALAMIN) 1000 MCG tablet Take 1 tablet (1,000 mcg total) by mouth daily. 180 tablet 3   atorvastatin  (LIPITOR) 40 MG tablet Take 1 tablet (40 mg total) by mouth daily. (Patient not taking: Reported on 03/22/2024) 90 tablet 3   blood glucose meter kit and supplies KIT Dispense based on patient and insurance preference. Use up to four times daily as directed. (Patient not taking: Reported on 03/22/2024) 1 each 0   Blood Glucose Monitoring Suppl DEVI 1 each by Does not apply route in the morning, at noon, and at bedtime. May substitute to any manufacturer covered by patient's insurance. (Patient not taking: Reported on  03/22/2024) 1 each 0   fluconazole  (DIFLUCAN ) 150 MG tablet TAKE 1 TABLET BY MOUTH, THEN AGAIN IN 3 DAYS (Patient not taking: Reported on 03/22/2024) 2 tablet 3   ondansetron  (ZOFRAN ) 8 MG tablet Take 1 tablet (8 mg total) by mouth every 8 (eight) hours as needed for nausea or vomiting. Start  on the third day after chemotherapy. (Patient not taking: Reported on 03/22/2024) 30 tablet 1   OVER THE COUNTER MEDICATION Store brand Ducolax stool softener. One capsule daily. (Patient not taking: Reported on 03/22/2024)     prochlorperazine  (COMPAZINE ) 10 MG tablet Take 1 tablet (10 mg total) by mouth every 6 (six) hours as needed for nausea or vomiting. (Patient not taking: Reported on 03/22/2024) 30 tablet 1   No current facility-administered medications for this visit.    Medication reconciliation is based on the patient's most recent medication list in the electronic medical record (EMR) including herbal products and OTC medications.   The patient's medication list was reviewed today with the patient? Yes   Drug-drug interactions (DDIs) DDIs were evaluated? Yes Significant DDIs identified? No   Drug-Food Interactions Drug-food interactions were evaluated? Yes Drug-food interactions identified? No   Follow-up Plan  Treatment start date: Planned for 03/27/24 Port placement date: Peripheral per patient request ECHO date: 03/17/24 Dr. Maryalice Smaller will meet with patient today at 4 pm. As above, patient and Dr. Maryalice Smaller were made aware that this regimen is not recommended yet by NCCN/ASCO in this setting. However, patient is not interested in surgery at this time and concerned about traditional chemotherapy regimens.  We reviewed the prescriptions, premedications, and treatment regimen with the patient. Possible side effects of the treatment regimen were reviewed and management strategies were discussed.  Can use over-the-counter (OTC) options of loperamide (Imodium) as needed for diarrhea and docusate + senna (Senna-S) as  needed for constipation.  Use prescription anti-nausea meds PRN Clinical pharmacy will assist Dr. Sonja Aptos and Thomasena Fleming on an as needed basis going forward  Thomasena Fleming participated in the discussion, expressed understanding, and voiced agreement with the above plan. All questions were answered to her satisfaction. The patient was advised to contact the clinic at [phone] with any questions or concerns prior to her return visit.   I spent 60 minutes assessing the patient.  Lido Maske A. Webb Hake, PharmD, BCOP, CPP  Althea Atkinson, RPH-CPP, 03/22/2024 2:52 PM  **Disclaimer: This note was dictated with voice recognition software. Similar sounding words can inadvertently be transcribed and this note may contain transcription errors which may not have been corrected upon publication of note.**

## 2024-03-22 NOTE — Assessment & Plan Note (Signed)
-  cT1cN1M0, ER+/PR+/HER2+ -presented with screening discovered left breast cancer with nodal metastasis.  - Breast MRI showed 2.5 cm mass in the upper inner quadrant of left breast, additional suspicious clumped linear non-mass enhancement in the retroareolar left measuring 5 cm, and additional small area of non-mass enhancement in the lateral left breast spanning 1.1 cm, 1.3 centimeter abnormal lymph node in the left low axilla, no additional abnormal lymph nodes.

## 2024-03-23 ENCOUNTER — Other Ambulatory Visit: Payer: Self-pay | Admitting: Hematology

## 2024-03-23 ENCOUNTER — Encounter: Payer: Self-pay | Admitting: *Deleted

## 2024-03-23 ENCOUNTER — Telehealth: Payer: Self-pay | Admitting: Hematology

## 2024-03-23 ENCOUNTER — Other Ambulatory Visit: Payer: Self-pay | Admitting: *Deleted

## 2024-03-23 DIAGNOSIS — Z170 Estrogen receptor positive status [ER+]: Secondary | ICD-10-CM

## 2024-03-23 DIAGNOSIS — R928 Other abnormal and inconclusive findings on diagnostic imaging of breast: Secondary | ICD-10-CM

## 2024-03-24 ENCOUNTER — Telehealth: Payer: Self-pay | Admitting: *Deleted

## 2024-03-24 ENCOUNTER — Encounter: Payer: Self-pay | Admitting: *Deleted

## 2024-03-24 NOTE — Telephone Encounter (Signed)
 Called pt with appt date and time for port placement at [facility] IR arrive at 8:30am, NPO after midnight, must have a driver, may take morning meds with a sip of water. Confirmed appt date and time, husband will be with her for 24hr supervision and driver after port. No further needs voiced at this time.

## 2024-03-27 ENCOUNTER — Other Ambulatory Visit: Payer: Self-pay | Admitting: Student

## 2024-03-27 DIAGNOSIS — C50212 Malignant neoplasm of upper-inner quadrant of left female breast: Secondary | ICD-10-CM

## 2024-03-27 NOTE — H&P (Signed)
 Chief Complaint: Left breast cancer; referred for port a cath placement to assist with treatment  Referring Provider(s): Feng,Y  Supervising Physician: Babcock,G  Patient Status: [facility] - Out-pt  History of Present Illness: [name] is a 73 y.o. female ex smoker with PMH sig for anemia, asthma, DM, GERD, peptic ulcer, HTN,HLD, OA, uterine fibroid and recently diagnosed left breast cancer with nodal involvement.   *** Patient is Full Code  Past Medical History:  Diagnosis Date   Acute MEE (middle ear effusion) 06/07/2014   Acute recurrent sinusitis 12/21/2014   Acute sinusitis 12/21/2014   Allergy    Anemia    hx   Asthma, intermittent 11/02/2018   ATTENTION DEFICIT, W/O HYPERACTIVITY 12/16/2006   Bilateral carpal tunnel syndrome 11/20/2016   Cataract    Cervical spondylosis with myelopathy, History of 04/28/2012   COLON POLYP 12/16/2006   (02/03/12) Surgical [Pathology], sigmoid colon, polyp HYPERPLASTIC POLYP(S) AND POLYPOID FRAGMETNS OF BENIGN COLONIC MUCOSA WITH INTRAMUCOSAL LYMPHOID AGGREGATES. NO ADENOMATOUS CHANGE OR MALIGNANCY IDENTIFIED.    DIABETES MELLITUS II, UNCOMPLICATED 12/16/2006   Fall 10/18/2020   GERD (gastroesophageal reflux disease)    H/O abnormal mammogram 07/19/2009   S/P Breast Biopsy [facility], [place], 07/2009): Benign findings.     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/20/2016    OSTEOARTHRITIS, MULTI SITES 12/16/2006   Perennial allergic rhinitis with seasonal variation 12/16/2006        PONV (postoperative nausea and vomiting)    last surgery only   Recurrent maxillary sinusitis    SACROILIITIS, HISTORY OF 01/08/2009   Qualifier: History of  By: McDiarmid MD, Todd     Seasonal allergies    Seborrheic keratosis 01/15/2016   SKELETAL HYPEROSTOSIS 03/29/2008   Ulcer    Uterine fibroid 02/02/2011   TVUS: 4cm fibroid w/ submucosal component, bil.hydrosalpinges, unable measurable endometrium - 08/18/2004     Past Surgical History:  Procedure Laterality Date   ANTERIOR CERVICAL DECOMP/DISCECTOMY FUSION  04/28/2012   Procedure: ANTERIOR CERVICAL DECOMPRESSION/DISCECTOMY FUSION 3 LEVELS;  Surgeon: Elder Greening, MD;  Location: [facility];  Service: Neurosurgery;  Laterality: N/A;  Cervical three-four,Cervical four-five,Cervical five-six anterior cervical decompression with fusion interbody prothesis plating and bonegraft   BREAST BIOPSY  07/19/2009   S/P Breast Biopsy [facility], [place], 07/2009): Benign findings.  (10/27/2010)   BREAST BIOPSY Left 02/25/2024   US  LT BREAST BX W LOC DEV 1ST LESION IMG BX SPEC US  GUIDE 02/25/2024 [facility]   BREAST BIOPSY Left 02/25/2024   US  LT BREAST BX W LOC DEV EA ADD LESION IMG BX SPEC US  GUIDE 02/25/2024 [facility]   BREAST BIOPSY Right 03/02/2024   US  RT BREAST BX W LOC DEV 1ST LESION IMG BX SPEC US  GUIDE 03/02/2024 [facility]   BREAST LUMPECTOMY Left    CARPAL TUNNEL RELEASE Left 12/10/2016   Procedure: LEFT CARPAL TUNNEL  RELEASE;  Surgeon: Lyanne Sample, MD;  Location: [facility];  Service: Orthopedics;  Laterality: Left;   COLONOSCOPY W/ POLYPECTOMY  12/17/2000   colonoscopy (Dr Tova Fresh) int. hemorrhoids &  nonneoplatic colon polyps - 01/10/2001,    COLONOSCOPY W/ POLYPECTOMY  01/18/2012   Dr Bridgett Camps (GI).sigmoid colon, hyperlastic polyp   ENDOMETRIAL BIOPSY  08/19/2004    Endometrial Biopsy - 09/01/2004,   NM MYOVIEW  LTD  04/18/2004   Cardiolite:EF63%, no ischemia - 05/01/2004,    REPLACEMENT TOTAL KNEE BILATERAL      Allergies: Januvia  [sitagliptin ], Jardiance  [empagliflozin ], Amlodipine , Celecoxib, and Diclofenac-misoprostol  Medications: Prior to Admission medications   Medication Sig Start Date End Date Taking? Authorizing Provider  amLODipine -benazepril  (LOTREL) 10-20 MG capsule TAKE 1 CAPSULE BY MOUTH DAILY 01/28/24   McDiarmid, Demetra Filter, MD  Apoaequorin (PREVAGEN PO) Take by mouth.    [provider]  atorvastatin  (LIPITOR) 40 MG tablet Take 1 tablet (40 mg total) by mouth daily. 05/11/19   McDiarmid, Demetra Filter, MD  blood glucose meter kit and supplies KIT Dispense based on patient and insurance preference. Use up to four times daily as directed. 12/01/22   Genora Kidd, MD  Blood Glucose Monitoring Suppl DEVI 1 each by Does not apply route in the morning, at noon, and at bedtime. May substitute to any manufacturer covered by patient's insurance. 12/01/22   Genora Kidd, MD  carvedilol  (COREG ) 6.25 MG tablet TAKE 1 TABLET(6.25 MG) BY MOUTH TWICE DAILY WITH A MEAL 11/18/23   McDiarmid, Demetra Filter, MD  fexofenadine  (ALLEGRA ) 180 MG tablet Take 180 mg by mouth daily.    [provider]  fluconazole  (DIFLUCAN ) 150 MG tablet TAKE 1 TABLET BY MOUTH, THEN AGAIN IN 3 DAYS 11/23/23   McDiarmid, Demetra Filter, MD  gabapentin  (NEURONTIN ) 100 MG capsule TAKE 1 CAPSULE(100 MG) BY MOUTH THREE TIMES DAILY AS NEEDED 01/25/24   McDiarmid, Demetra Filter, MD  hydrochlorothiazide  (HYDRODIURIL ) 25 MG tablet TAKE 1 TABLET(25 MG) BY MOUTH DAILY 02/07/24   McDiarmid, Demetra Filter, MD  HYDROcodone -acetaminophen  (NORCO) 7.5-325 MG tablet Take 1 tablet by mouth every 8 (eight) hours as needed for moderate pain (pain score 4-6). 02/23/24 03/24/24  Candee Cha, MD  metFORMIN  (GLUCOPHAGE -XR) 500 MG 24 hr tablet TAKE 1 TABLET(500 MG) BY MOUTH TWICE DAILY WITH A MEAL 08/09/23   McDiarmid, Demetra Filter, MD  methylphenidate  (RITALIN ) 10 MG tablet Take 2 tablets (20 mg total) by mouth 3 (three) times daily with meals. 02/23/24 03/24/24  Candee Cha, MD  naproxen  (NAPROSYN ) 500 MG tablet TAKE 1 TABLET(500 MG) BY MOUTH TWICE DAILY WITH A MEAL 05/12/23   McDiarmid, Demetra Filter, MD  ondansetron  (ZOFRAN ) 8 MG tablet Take 1 tablet (8 mg total) by mouth every 8 (eight) hours as needed for nausea or vomiting. Start on the third day after chemotherapy. 03/10/24   Sonja Rockford, MD  OVER THE COUNTER MEDICATION Store brand Ducolax stool softener. One capsule daily.    [provider]  prochlorperazine  (COMPAZINE ) 10 MG tablet Take 1 tablet (10 mg total) by mouth every 6 (six) hours as needed for nausea or vomiting. 03/10/24   Sonja Lowgap, MD  vitamin B-12 (CYANOCOBALAMIN) 1000 MCG tablet Take 1 tablet (1,000 mcg total) by mouth daily. 05/11/19   McDiarmid, Demetra Filter, MD     Family History  Problem Relation Age of Onset   Hypertension Mother    Arthritis Mother    Cancer Sister 84       Breast  Cancer Sister 65       Breast   Arthritis Father    Diabetes Father    Heart attack Sister    Drug abuse Sister    Colon cancer Neg Hx    Esophageal cancer Neg Hx    Rectal cancer Neg Hx    Stomach cancer Neg Hx     Social History   Socioeconomic History   Marital status: Married    Spouse name: Not on file   Number of children: 1   Years of education: Not on file   Highest education level: Not on file  Occupational History   Occupation: Homemaker    Employer: RETIRED  Tobacco Use   Smoking status: Former    Current packs/day: 0.00    Average packs/day: 1 pack/day for 4.0 years (4.0 ttl pk-yrs)    Types: Cigarettes    Start date: 10/20/1975    Quit date: 10/20/1979    Years since quitting: 44.4   Smokeless tobacco: Never  Vaping Use   Vaping status: Never Used  Substance and Sexual Activity   Alcohol use: No   Drug use: No   Sexual activity: Yes  Other Topics Concern   Not on file  Social  History Narrative   Lives with husband one adult son with autism and a granddgt, no etoh, Mother of 14 adopted children, many with special needs .Regular exercise-no   Social Drivers of Health   Financial Resource Strain: Low Risk  (02/28/2024)   Overall Financial Resource Strain (CARDIA)    Difficulty of Paying Living Expenses: Not hard at all  Food Insecurity: No Food Insecurity (02/28/2024)   Hunger Vital Sign    Worried About Running Out of Food in the Last Year: Never true    Ran Out of Food in the Last Year: Never true  Transportation Needs: No Transportation Needs (02/28/2024)   PRAPARE - Administrator, Civil Service (Medical): No    Lack of Transportation (Non-Medical): No  Physical Activity: Insufficiently Active (02/28/2024)   Exercise Vital Sign    Days of Exercise per Week: 2 days    Minutes of Exercise per Session: 20 min  Stress: No Stress Concern Present (02/28/2024)   [employer] of Occupational Health - Occupational Stress Questionnaire    Feeling of Stress : Not at all  Social Connections: Socially Integrated (02/28/2024)   Social Connection and Isolation Panel [NHANES]    Frequency of Communication with Friends and Family: More than three times a week    Frequency of Social Gatherings with Friends and Family: Twice a week    Attends Religious Services: 1 to 4 times per year    Active Member of [employer] or Organizations: Yes    Attends Banker Meetings: Never    Marital Status: Married       Review of Systems  Vital Signs:   Advance Care Plan: no documents on file     Physical Exam  Imaging: NM PET Image Initial (PI) Skull Base To Thigh Result Date: 03/22/2024 CLINICAL DATA:  Subsequent treatment strategy for breast carcinoma. LEFT breast carcinoma. ER receptor positive. EXAM: NUCLEAR MEDICINE PET SKULL BASE TO THIGH TECHNIQUE: 9.8 mCi F-18 FDG was injected intravenously. Full-ring PET imaging was performed from the skull base to  thigh after the radiotracer. CT data was obtained and used for attenuation correction and anatomic localization. Fasting blood glucose: 172 mg/dl COMPARISON:  None Available. FINDINGS: NECK: No hypermetabolic lymph nodes in the neck.  No edge Incidental CT findings: None. CHEST: No hypermetabolic lesion within the breast tissue. Single mildly hypermetabolic LEFT axial lymph node measures 10 mm short axis SUV max equal 3.1 on image 76. No hypermetabolic central thoracic lymph nodes. No suspicious pulmonary nodules. Incidental CT findings: None. ABDOMEN/PELVIS: No abnormal hypermetabolic activity within the liver, pancreas, adrenal glands, or spleen. No hypermetabolic lymph nodes in the abdomen or pelvis. Scattered metabolic activity within the colon is physiologic. Incidental CT findings: Uterus and adnexa unremarkable. SKELETON: No focal hypermetabolic activity to suggest skeletal metastasis. Incidental CT findings: Anterior cervical fusion. IMPRESSION: 1. Single mildly hypermetabolic LEFT axillary lymph node is indeterminate. 2. No evidence of metastatic adenopathy in the neck, chest, abdomen, or pelvis. 3. No evidence of visceral metastasis or skeletal metastasis. Electronically Signed   By: Deboraha Fallow M.D.   On: 03/22/2024 10:54   ECHOCARDIOGRAM COMPLETE Result Date: 03/17/2024    ECHOCARDIOGRAM REPORT   Patient Name:   ROSELLEN LICHTENBERGER Date of Exam: 03/17/2024 Medical Rec #:  [id number]    Height:       62.0 in Accession #:    [id number]   Weight:       197.6 lb Date of Birth:  [birth year]    BSA:          1.902 m Patient Age:    73 years     BP:           151/78 mmHg Patient Gender: F            HR:           88 bpm. Exam Location:  Outpatient Procedure: 2D Echo, Cardiac Doppler and Color Doppler (Both Spectral and Color            Flow Doppler were utilized during procedure). Indications:    Chemo  History:        Patient has no prior history of Echocardiogram examinations.                 Risk  Factors:Hypertension and Diabetes.  Sonographer:    Andrena Bang Referring Phys: [id number] LACIE K BURTON IMPRESSIONS  1. Abnormal strain, but there is poor tracking of the apical segments. Left ventricular ejection fraction, by estimation, is 60 to 65%. The left ventricle has normal function. The left ventricle has no regional wall motion abnormalities. Left ventricular diastolic parameters were normal. The average left ventricular global longitudinal strain is -15.9 %. The global longitudinal strain is abnormal.  2. Right ventricular systolic function is normal. The right ventricular size is normal.  3. The mitral valve is normal in structure. Trivial mitral valve regurgitation. No evidence of mitral stenosis.  4. The aortic valve is tricuspid. Aortic valve regurgitation is not visualized. No aortic stenosis is present.  5. The inferior vena cava is normal in size with greater than 50% respiratory variability, suggesting right atrial pressure of 3 mmHg. FINDINGS  Left Ventricle: Abnormal strain, but there is poor tracking of the apical segments. Left ventricular ejection fraction, by estimation, is 60 to 65%. The left ventricle has normal function. The left ventricle has no regional wall motion abnormalities. Definity  contrast agent was given IV to delineate the left ventricular endocardial borders. The average left ventricular global longitudinal strain is -15.9 %. Strain was performed and the global longitudinal strain is abnormal. The left ventricular internal cavity size was normal in size. There is no left ventricular hypertrophy. Left ventricular diastolic parameters were normal. Right Ventricle: The right ventricular size  is normal. No increase in right ventricular wall thickness. Right ventricular systolic function is normal. Left Atrium: Left atrial size was normal in size. Right Atrium: Right atrial size was normal in size. Pericardium: There is no evidence of pericardial effusion. Mitral Valve: The mitral  valve is normal in structure. Mild mitral annular calcification. Trivial mitral valve regurgitation. No evidence of mitral valve stenosis. Tricuspid Valve: The tricuspid valve is normal in structure. Tricuspid valve regurgitation is trivial. No evidence of tricuspid stenosis. Aortic Valve: The aortic valve is tricuspid. Aortic valve regurgitation is not visualized. No aortic stenosis is present. Aortic valve mean gradient measures 4.0 mmHg. Aortic valve peak gradient measures 8.1 mmHg. Aortic valve area, by VTI measures 1.64 cm. Pulmonic Valve: The pulmonic valve was normal in structure. Pulmonic valve regurgitation is not visualized. No evidence of pulmonic stenosis. Aorta: The aortic root is normal in size and structure. Venous: The inferior vena cava is normal in size with greater than 50% respiratory variability, suggesting right atrial pressure of 3 mmHg. IAS/Shunts: No atrial level shunt detected by color flow Doppler.  LEFT VENTRICLE PLAX 2D LVIDd:         4.30 cm      Diastology LVIDs:         2.60 cm      LV e' medial:    7.40 cm/s LV PW:         1.10 cm      LV E/e' medial:  14.3 LV IVS:        1.10 cm      LV e' lateral:   5.77 cm/s LVOT diam:     1.70 cm      LV E/e' lateral: 18.4 LV SV:         46 LV SV Index:   24           2D Longitudinal Strain LVOT Area:     2.27 cm     2D Strain GLS Avg:     -15.9 %  LV Volumes (MOD) LV vol d, MOD A2C: 123.0 ml LV vol d, MOD A4C: 101.0 ml LV vol s, MOD A2C: 30.3 ml LV vol s, MOD A4C: 32.2 ml LV SV MOD A2C:     92.7 ml LV SV MOD A4C:     101.0 ml LV SV MOD BP:      83.7 ml RIGHT VENTRICLE RV S prime:     12.70 cm/s TAPSE (M-mode): 1.8 cm LEFT ATRIUM           Index LA diam:      4.00 cm 2.10 cm/m LA Vol (A4C): 44.8 ml 23.56 ml/m  AORTIC VALVE AV Area (Vmax):    1.61 cm AV Area (Vmean):   1.29 cm AV Area (VTI):     1.64 cm AV Vmax:           142.00 cm/s AV Vmean:          91.800 cm/s AV VTI:            0.278 m AV Peak Grad:      8.1 mmHg AV Mean Grad:      4.0  mmHg LVOT Vmax:         101.00 cm/s LVOT Vmean:        52.200 cm/s LVOT VTI:          0.201 m LVOT/AV VTI ratio: 0.72  AORTA Ao Asc diam: 2.90 cm MITRAL VALVE MV Area (PHT): 3.42 cm  SHUNTS MV Decel Time: 222 msec     Systemic VTI:  0.20 m MV E velocity: 106.00 cm/s  Systemic Diam: 1.70 cm MV A velocity: 111.00 cm/s MV E/A ratio:  0.95 Arta Lark Electronically signed by Arta Lark Signature Date/Time: 03/17/2024/4:35:23 PM    Final    MR BREAST BILATERAL W WO CONTRAST INC CAD Result Date: 03/16/2024 CLINICAL DATA:  Staging exam. 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 patient had benign right breast biopsies. EXAM: BILATERAL BREAST MRI WITH AND WITHOUT CONTRAST TECHNIQUE: Multiplanar, multisequence MR images of both breasts were obtained prior to and following the intravenous administration of 9 ml of Gadavist  Three-dimensional MR images were rendered by post-processing of the original MR data on an independent workstation. The three-dimensional MR images were interpreted, and findings are reported in the following complete MRI report for this study. Three dimensional images were evaluated at the independent interpreting workstation using the DynaCAD thin client. COMPARISON:  None available. FINDINGS: Breast composition: b. Scattered fibroglandular tissue. Background parenchymal enhancement: Mild Right breast: Biopsy-proven benign small masses noted in the upper outer anterior right breast. There is no suspicious enhancing mass or non mass enhancement elsewhere in the right breast. Left breast: Biopsy-proven metastatic intramammary lymph node is noted in the upper outer left breast measuring 0.9 cm (series 1009, image 74). In the upper inner left breast there is clumped non mass enhancement measuring 2.5 x 1.3 x 0.5 cm consistent with biopsy proven malignancy. Extending from the nipple back there is suspicious linear clumped non mass  enhancement spanning up to approximately 5 cm (series 1014 images 25 through 37). There is suspicious linear non mass enhancement in the lateral left breast spanning 1.1 cm (series 1014, image 44). Lymph nodes: Abnormal lymph node in the low left axilla with cortical thickness measuring 1.3 cm consistent with biopsy proven metastatic lymph node. No additional abnormal lymph nodes in the bilateral axillary regions. Ancillary findings: Indeterminate T2 bright enhancing lesion in the sternum (series 6, image 56). IMPRESSION: 1. Biopsy-proven malignancy in the upper inner left breast measuring up to 2.5 cm. 2. Additional suspicious clumped linear non mass enhancement in the retroareolar left breast spanning up to 5 cm. 3. Additional small area of linear non mass enhancement in the lateral left breast spanning 1.1 cm. 4.  No MRI evidence of malignancy in the right breast. 5. Biopsy proven metastatic intramammary lymph node in the left breast and low left axillary node. No additional suspicious lymph nodes bilaterally. 6.  Indeterminate T2 bright enhancing lesion in the sternum. RECOMMENDATION: 1. If breast conservation is desired recommend MRI guided core needle biopsy x3 of the left breast targeting the anterior and posterior extent of the non mass enhancement in the retroareolar aspect as well as the non mass enhancement in the lateral left breast. 2. Bone scan and or cross-sectional imaging for further evaluation of the enhancing lesion in the sternum. BI-RADS CATEGORY  4: Suspicious. Electronically Signed   By: Allena Ito M.D.   On: 03/16/2024 09:59   US  RT BREAST BX W LOC DEV 1ST LESION IMG BX SPEC US  GUIDE Addendum Date: 03/03/2024 ADDENDUM REPORT: 03/03/2024 14:23 ADDENDUM: Pathology revealed FIBROADENOMATOID CHANGES of the RIGHT breast, (heart clip). This was found to be concordant by Dr. Severiano Danish. Pathology revealed BENIGN LYMPH NODE WITH REACTIVE FEATURES of the RIGHT axilla, (hydromark clip). This was  found to be concordant by Dr. Severiano Danish. Pathology results were discussed with  the patient by telephone. The patient reported doing well after the biopsies with tenderness at the sites. Post biopsy instructions and care were reviewed and questions were answered. The patient was encouraged to call [facility] for any additional concerns. My direct phone number was provided. The patient has a recent diagnosis of LEFT breast cancer and medical oncology consultation has been requested at [facility]. Pathology results reported by Kraig Peru, RN on 03/03/2024. Electronically Signed   By: Sundra Engel M.D.   On: 03/03/2024 14:23   Result Date: 03/03/2024 CLINICAL DATA:  72 year old female presents for ultrasound-guided biopsies of a 1.1 cm UPPER-OUTER RIGHT breast mass and an abnormal RIGHT axillary lymph node. Recent diagnosis of LEFT breast cancer with LEFT axillary lymph node metastasis. EXAM: ULTRASOUND GUIDED RIGHT BREAST CORE NEEDLE BIOPSY ULTRASOUND-GUIDED RIGHT AXILLARY LYMPH NODE BIOPSY COMPARISON:  Previous exam(s). PROCEDURE: I met with the patient and we discussed the procedure of ultrasound-guided biopsy, including benefits and alternatives. We discussed the high likelihood of a successful procedure. We discussed the risks of the procedure, including infection, bleeding, tissue injury, clip migration, and inadequate sampling. Informed written consent was given. The usual time-out protocol was performed immediately prior to the procedure. ULTRASOUND GUIDED RIGHT BREAST CORE NEEDLE BIOPSY Lesion quadrant: UPPER-OUTER RIGHT breast Using sterile technique and 1% Lidocaine  with and without epinephrine  as local anesthetic, under direct ultrasound visualization, a 12 gauge spring-loaded device was used to perform biopsy of the 1.1 cm mass at the 10 o'clock position 3 cm from the nipple using a LATERAL approach. At the conclusion of the procedure a HEART shaped tissue marker  clip was deployed into the biopsy cavity. Follow up 2 view mammogram was performed and dictated separately. ULTRASOUND-GUIDED RIGHT AXILLARY LYMPH NODE BIOPSY Using sterile technique and 1% Lidocaine  with and without epinephrine  as local anesthetic, under direct ultrasound visualization, a 14 gauge spring-loaded device was used to perform biopsy of the abnormal appearing RIGHT axillary lymph node using a LATERAL approach. At the conclusion of the procedure a HydroMARK spiral shaped tissue marker clip was deployed into the biopsy cavity. Follow up 2 view mammogram was performed and dictated separately. IMPRESSION: Ultrasound guided biopsy of 1.1 cm 10 o'clock position RIGHT breast mass. Ultrasound-guided biopsy of abnormal appearing RIGHT axillary lymph node. No apparent complications. Electronically Signed: By: Sundra Engel M.D. On: 03/02/2024 11:14   US  AXILLARY NODE CORE BIOPSY RIGHT Addendum Date: 03/03/2024 ADDENDUM REPORT: 03/03/2024 14:23 ADDENDUM: Pathology revealed FIBROADENOMATOID CHANGES of the RIGHT breast, (heart clip). This was found to be concordant by Dr. Severiano Danish. Pathology revealed BENIGN LYMPH NODE WITH REACTIVE FEATURES of the RIGHT axilla, (hydromark clip). This was found to be concordant by Dr. Severiano Danish. Pathology results were discussed with the patient by telephone. The patient reported doing well after the biopsies with tenderness at the sites. Post biopsy instructions and care were reviewed and questions were answered. The patient was encouraged to call [facility] for any additional concerns. My direct phone number was provided. The patient has a recent diagnosis of LEFT breast cancer and medical oncology consultation has been requested at [facility]. Pathology results reported by Kraig Peru, RN on 03/03/2024. Electronically Signed   By: Sundra Engel M.D.   On: 03/03/2024 14:23   Result Date: 03/03/2024 CLINICAL DATA:  73 year old female presents  for ultrasound-guided biopsies of a 1.1 cm UPPER-OUTER RIGHT breast mass and an abnormal RIGHT axillary lymph node.  Recent diagnosis of LEFT breast cancer with LEFT axillary lymph node metastasis. EXAM: ULTRASOUND GUIDED RIGHT BREAST CORE NEEDLE BIOPSY ULTRASOUND-GUIDED RIGHT AXILLARY LYMPH NODE BIOPSY COMPARISON:  Previous exam(s). PROCEDURE: I met with the patient and we discussed the procedure of ultrasound-guided biopsy, including benefits and alternatives. We discussed the high likelihood of a successful procedure. We discussed the risks of the procedure, including infection, bleeding, tissue injury, clip migration, and inadequate sampling. Informed written consent was given. The usual time-out protocol was performed immediately prior to the procedure. ULTRASOUND GUIDED RIGHT BREAST CORE NEEDLE BIOPSY Lesion quadrant: UPPER-OUTER RIGHT breast Using sterile technique and 1% Lidocaine  with and without epinephrine  as local anesthetic, under direct ultrasound visualization, a 12 gauge spring-loaded device was used to perform biopsy of the 1.1 cm mass at the 10 o'clock position 3 cm from the nipple using a LATERAL approach. At the conclusion of the procedure a HEART shaped tissue marker clip was deployed into the biopsy cavity. Follow up 2 view mammogram was performed and dictated separately. ULTRASOUND-GUIDED RIGHT AXILLARY LYMPH NODE BIOPSY Using sterile technique and 1% Lidocaine  with and without epinephrine  as local anesthetic, under direct ultrasound visualization, a 14 gauge spring-loaded device was used to perform biopsy of the abnormal appearing RIGHT axillary lymph node using a LATERAL approach. At the conclusion of the procedure a HydroMARK spiral shaped tissue marker clip was deployed into the biopsy cavity. Follow up 2 view mammogram was performed and dictated separately. IMPRESSION: Ultrasound guided biopsy of 1.1 cm 10 o'clock position RIGHT breast mass. Ultrasound-guided biopsy of abnormal appearing  RIGHT axillary lymph node. No apparent complications. Electronically Signed: By: Sundra Engel M.D. On: 03/02/2024 11:14   MM CLIP PLACEMENT RIGHT Result Date: 03/02/2024 CLINICAL DATA:  Evaluate placement of biopsy clips following ultrasound-guided RIGHT breast and RIGHT axillary biopsies. EXAM: 3D DIAGNOSTIC RIGHT MAMMOGRAM POST ULTRASOUND BIOPSY COMPARISON:  Previous exam(s). ACR Breast Density Category b: There are scattered areas of fibroglandular density. FINDINGS: 3D Mammographic images were obtained following ultrasound guided biopsy of the 1.1 cm mass at the 10 o'clock position of the RIGHT breast (heart clip) and abnormal RIGHT axillary lymph node (HydroMARK spiral clip). The heart and spiral biopsy clips are in expected position at the site of biopsy. IMPRESSION: Appropriate positioning of the heart shaped biopsy marking clip at the site of biopsy in the anterior UPPER-OUTER RIGHT breast. Appropriate positioning of the spiral shaped biopsy marking clip in the RIGHT axilla. Final Assessment: Post Procedure Mammograms for Marker Placement Electronically Signed   By: Sundra Engel M.D.   On: 03/02/2024 11:40    Labs:  CBC: Recent Labs    03/08/24 1140  WBC 8.4  HGB 11.5*  HCT 34.9*  PLT 352    COAGS: No results for input(s): "INR", "APTT" in the last 8760 hours.  BMP: Recent Labs    03/08/24 1140  NA 138  K 4.3  CL 101  CO2 27  GLUCOSE 199*  BUN 22  CALCIUM  9.8  CREATININE 0.89  GFRNONAA >60    LIVER FUNCTION TESTS: Recent Labs    03/08/24 1140  BILITOT 0.3  AST 15  ALT 18  ALKPHOS 68  PROT 8.0  ALBUMIN 4.3    TUMOR MARKERS: No results for input(s): "AFPTM", "CEA", "CA199", "CHROMGRNA" in the last 8760 hours.  Assessment and Plan: 73 y.o. female ex smoker with PMH sig for anemia, asthma, DM, GERD, peptic ulcer, HTN,HLD, OA, uterine fibroid and recently diagnosed left breast cancer with nodal involvement.Risks and benefits  of image guided port-a-catheter  placement was discussed with the patient including, but not limited to bleeding, infection, pneumothorax, or fibrin sheath development and need for additional procedures.  All of the patient's questions were answered, patient is agreeable to proceed. Consent signed and in chart.    Thank you for allowing our service to participate in NATHA GUIN 's care.  Electronically Signed: D. Honore Lux, PA-C   03/27/2024, 12:55 PM      I spent a total of  20 minutes   in face to face in clinical consultation, greater than 50% of which was counseling/coordinating care for port a cath placement

## 2024-03-28 ENCOUNTER — Ambulatory Visit (HOSPITAL_COMMUNITY)
Admission: RE | Admit: 2024-03-28 | Discharge: 2024-03-28 | Disposition: A | Discharge status: Home / Self Care | Source: Ambulatory Visit | Attending: Hematology | Admitting: Hematology

## 2024-03-28 ENCOUNTER — Ambulatory Visit (HOSPITAL_COMMUNITY)
Admission: RE | Admit: 2024-03-28 | Discharge: 2024-03-28 | Disposition: A | Discharge status: Home / Self Care | Source: Ambulatory Visit | Attending: Hematology

## 2024-03-28 ENCOUNTER — Other Ambulatory Visit: Payer: Self-pay

## 2024-03-28 ENCOUNTER — Encounter (HOSPITAL_COMMUNITY): Payer: Self-pay

## 2024-03-28 DIAGNOSIS — C50212 Malignant neoplasm of upper-inner quadrant of left female breast: Secondary | ICD-10-CM

## 2024-03-28 DIAGNOSIS — Z170 Estrogen receptor positive status [ER+]: Secondary | ICD-10-CM

## 2024-03-28 DIAGNOSIS — Z87891 Personal history of nicotine dependence: Secondary | ICD-10-CM | POA: Diagnosis not present

## 2024-03-28 DIAGNOSIS — C50912 Malignant neoplasm of unspecified site of left female breast: Secondary | ICD-10-CM | POA: Diagnosis not present

## 2024-03-28 HISTORY — PX: IR IMAGING GUIDED PORT INSERTION: IMG5740

## 2024-03-28 LAB — GLUCOSE, CAPILLARY: Glucose-Capillary: 199 mg/dL — ABNORMAL HIGH (ref 70–99)

## 2024-03-28 MED ORDER — FENTANYL CITRATE (PF) 100 MCG/2ML IJ SOLN
INTRAMUSCULAR | Status: AC
  Filled 2024-03-28: qty 2

## 2024-03-28 MED ORDER — SODIUM CHLORIDE 0.9 % IV SOLN: INTRAVENOUS | Status: DC

## 2024-03-28 MED ORDER — MIDAZOLAM HCL 2 MG/2ML IJ SOLN
INTRAMUSCULAR | Status: AC
Start: 2024-03-28 — End: ?
  Filled 2024-03-28: qty 2

## 2024-03-28 MED ORDER — LIDOCAINE-EPINEPHRINE 1 %-1:100000 IJ SOLN
20.0000 mL | Freq: Once | INTRAMUSCULAR | Status: AC
  Administered 2024-03-28: 17 mL via INTRADERMAL

## 2024-03-28 MED ORDER — MIDAZOLAM HCL 2 MG/2ML IJ SOLN
INTRAMUSCULAR | Status: AC | PRN
  Administered 2024-03-28 (×2): 1 mg via INTRAVENOUS

## 2024-03-28 MED ORDER — HEPARIN SOD (PORK) LOCK FLUSH 100 UNIT/ML IV SOLN
INTRAVENOUS | Status: AC
Start: 2024-03-28 — End: ?
  Filled 2024-03-28: qty 5

## 2024-03-28 MED ORDER — LIDOCAINE-EPINEPHRINE 1 %-1:100000 IJ SOLN
INTRAMUSCULAR | Status: AC
  Filled 2024-03-28: qty 1

## 2024-03-28 MED ORDER — HEPARIN SOD (PORK) LOCK FLUSH 100 UNIT/ML IV SOLN
500.0000 [IU] | Freq: Once | INTRAVENOUS | Status: AC
  Administered 2024-03-28: 500 [IU] via INTRAVENOUS

## 2024-03-28 MED ORDER — FENTANYL CITRATE (PF) 100 MCG/2ML IJ SOLN
INTRAMUSCULAR | Status: AC | PRN
  Administered 2024-03-28 (×2): 50 ug via INTRAVENOUS

## 2024-03-28 NOTE — Procedures (Signed)
 Interventional Radiology Procedure Note  Procedure: chest port placement  Complications: None  Estimated Blood Loss: < 10 mL  Findings: RIJ SL chest port placed without difficulty.  Rosetta Cons, MD

## 2024-03-28 NOTE — Discharge Instructions (Addendum)
 Discharge Instructions:   Please call Interventional Radiology clinic [phone] with any questions or concerns.  You may remove your dressing and shower tomorrow.  Do not use EMLA / Lidocaine  cream for 2 weeks post Port Insertion this will rmove the surgical.glue and steri strips.   Moderate Conscious Sedation, Adult, Care After  This sheet gives you information about how to care for yourself after your procedure. Your health care provider may also give you more specific instructions. If you have problems or questions, contact your health care provider. What can I expect after the procedure? After the procedure, it is common to have: Sleepiness for several hours. Impaired judgment for several hours. Difficulty with balance. Vomiting if you eat too soon. Follow these instructions at home: For the time period you were told by your health care provider: Rest. Do not participate in activities where you could fall or become injured. Do not drive or use machinery. Do not drink alcohol. Do not take sleeping pills or medicines that cause drowsiness. Do not make important decisions or sign legal documents. Do not take care of children on your own. Eating and drinking  Follow the diet recommended by your health care provider. Drink enough fluid to keep your urine pale yellow. If you vomit: Drink water, juice, or soup when you can drink without vomiting. Make sure you have little or no nausea before eating solid foods. General instructions Take over-the-counter and prescription medicines only as told by your health care provider. Have a responsible adult stay with you for the time you are told. It is important to have someone help care for you until you are awake and alert. Do not smoke. Keep all follow-up visits as told by your health care provider. This is important. Contact a health care provider if: You are still sleepy or having trouble with balance after 24 hours. You feel  light-headed. You keep feeling nauseous or you keep vomiting. You develop a rash. You have a fever. You have redness or swelling around the IV site. Get help right away if: You have trouble breathing. You have new-onset confusion at home. Summary After the procedure, it is common to feel sleepy, have impaired judgment, or feel nauseous if you eat too soon. Rest after you get home. Know the things you should not do after the procedure. Follow the diet recommended by your health care provider and drink enough fluid to keep your urine pale yellow. Get help right away if you have trouble breathing or new-onset confusion at home. This information is not intended to replace advice given to you by your health care provider. Make sure you discuss any questions you have with your health care provider. Document Revised: 02/02/2020 Document Reviewed: 08/31/2019 Elsevier Patient Education  2023 Elsevier Inc..   Implanted [place] Insertion, Care After  The following information offers guidance on how to care for yourself after your procedure. Your health care provider may also give you more specific instructions. If you have problems or questions, contact your health care provider. What can I expect after the procedure? After the procedure, it is common to have: Discomfort at the port insertion site. Bruising on the skin over the port. This should improve over 3-4 days. Follow these instructions at home: [facility] care After your port is placed, you will get a manufacturer's information card. The card has information about your port. Keep this card with you at all times. Take care of the port as told by your health care provider. Ask your  health care provider if you or a family member can get training for taking care of the port at home. A home health care nurse will be be available to help care for the port. Make sure to remember what type of port you have. Incision care     Follow instructions from  your health care provider about how to take care of your port insertion site. Make sure you: Wash your hands with soap and water for at least 20 seconds before and after you change your bandage (dressing). If soap and water are not available, use hand sanitizer. Change your dressing as told by your health care provider. Leave stitches (sutures), skin glue, or adhesive strips in place. These skin closures may need to stay in place for 2 weeks or longer. If adhesive strip edges start to loosen and curl up, you may trim the loose edges. Do not remove adhesive strips completely unless your health care provider tells you to do that. Check your port insertion site every day for signs of infection. Check for: Redness, swelling, or pain. Fluid or blood. Warmth. Pus or a bad smell. Activity Return to your normal activities as told by your health care provider. Ask your health care provider what activities are safe for you. You may have to avoid lifting. Ask your health care provider how much you can safely lift. General instructions Take over-the-counter and prescription medicines only as told by your health care provider. Do not take baths, swim, or use a hot tub until your health care provider approves. Ask your health care provider if you may take showers. You may only be allowed to take sponge baths. If you were given a sedative during the procedure, it can affect you for several hours. Do not drive or operate machinery until your health care provider says that it is safe. Wear a medical alert bracelet in case of an emergency. This will tell any health care providers that you have a port. Keep all follow-up visits. This is important. Contact a health care provider if: You cannot flush your port with saline as directed, or you cannot draw blood from the port. You have a fever or chills. You have redness, swelling, or pain around your port insertion site. You have fluid or blood coming from your port  insertion site. Your port insertion site feels warm to the touch. You have pus or a bad smell coming from the port insertion site. Get help right away if: You have chest pain or shortness of breath. You have bleeding from your port that you cannot control. These symptoms may be an emergency. Get help right away. Call 911. Do not wait to see if the symptoms will go away. Do not drive yourself to the hospital. Summary Take care of the port as told by your health care provider. Keep the manufacturer's information card with you at all times. Change your dressing as told by your health care provider. Contact a health care provider if you have a fever or chills or if you have redness, swelling, or pain around your port insertion site. Keep all follow-up visits. This information is not intended to replace advice given to you by your health care provider. Make sure you discuss any questions you have with your health care provider. Document Revised: 04/08/2021 Document Reviewed: 04/08/2021 Elsevier Patient Education  2023 [employer].

## 2024-03-28 NOTE — Sedation Documentation (Addendum)
 RN Ethelmae Ringel pulled 2mg  Versed  and 100mcg Fentanyl  in Ir room pysix. Pt. Received 2mg  Versed  and 100mcg Fentanyl  throughout the procedure.

## 2024-03-29 ENCOUNTER — Inpatient Hospital Stay

## 2024-03-29 ENCOUNTER — Other Ambulatory Visit

## 2024-03-29 VITALS — BP 152/56 | HR 80 | Temp 98.9°F | Resp 16 | Wt 199.5 lb

## 2024-03-29 DIAGNOSIS — C50212 Malignant neoplasm of upper-inner quadrant of left female breast: Secondary | ICD-10-CM

## 2024-03-29 DIAGNOSIS — Z1731 Human epidermal growth factor receptor 2 positive status: Secondary | ICD-10-CM | POA: Diagnosis not present

## 2024-03-29 DIAGNOSIS — Z170 Estrogen receptor positive status [ER+]: Secondary | ICD-10-CM | POA: Diagnosis not present

## 2024-03-29 DIAGNOSIS — Z95828 Presence of other vascular implants and grafts: Secondary | ICD-10-CM | POA: Insufficient documentation

## 2024-03-29 DIAGNOSIS — Z5112 Encounter for antineoplastic immunotherapy: Secondary | ICD-10-CM | POA: Diagnosis not present

## 2024-03-29 DIAGNOSIS — Z1721 Progesterone receptor positive status: Secondary | ICD-10-CM | POA: Diagnosis not present

## 2024-03-29 LAB — CBC WITH DIFFERENTIAL (CANCER CENTER ONLY)
Abs Immature Granulocytes: 0.02 10*3/uL (ref 0.00–0.07)
Basophils Absolute: 0.1 10*3/uL (ref 0.0–0.1)
Basophils Relative: 1 %
Eosinophils Absolute: 0.2 10*3/uL (ref 0.0–0.5)
Eosinophils Relative: 3 %
HCT: 32.4 % — ABNORMAL LOW (ref 36.0–46.0)
Hemoglobin: 10.7 g/dL — ABNORMAL LOW (ref 12.0–15.0)
Immature Granulocytes: 0 %
Lymphocytes Relative: 30 %
Lymphs Abs: 2.3 10*3/uL (ref 0.7–4.0)
MCH: 26.4 pg (ref 26.0–34.0)
MCHC: 33 g/dL (ref 30.0–36.0)
MCV: 80 fL (ref 80.0–100.0)
Monocytes Absolute: 0.6 10*3/uL (ref 0.1–1.0)
Monocytes Relative: 8 %
Neutro Abs: 4.4 10*3/uL (ref 1.7–7.7)
Neutrophils Relative %: 58 %
Platelet Count: 277 10*3/uL (ref 150–400)
RBC: 4.05 MIL/uL (ref 3.87–5.11)
RDW: 13.2 % (ref 11.5–15.5)
WBC Count: 7.7 10*3/uL (ref 4.0–10.5)
nRBC: 0 % (ref 0.0–0.2)

## 2024-03-29 LAB — CMP (CANCER CENTER ONLY)
ALT: 18 U/L (ref 0–44)
AST: 17 U/L (ref 15–41)
Albumin: 4 g/dL (ref 3.5–5.0)
Alkaline Phosphatase: 56 U/L (ref 38–126)
Anion gap: 9 (ref 5–15)
BUN: 19 mg/dL (ref 8–23)
CO2: 26 mmol/L (ref 22–32)
Calcium: 9.4 mg/dL (ref 8.9–10.3)
Chloride: 100 mmol/L (ref 98–111)
Creatinine: 0.85 mg/dL (ref 0.44–1.00)
GFR, Estimated: >60 mL/min (ref >60)
Glucose, Bld: 252 mg/dL — ABNORMAL HIGH (ref 70–99)
Potassium: 4.1 mmol/L (ref 3.5–5.1)
Sodium: 135 mmol/L (ref 135–145)
Total Bilirubin: 0.3 mg/dL (ref 0.0–1.2)
Total Protein: 7.4 g/dL (ref 6.5–8.1)

## 2024-03-29 MED ORDER — DIPHENHYDRAMINE HCL 25 MG PO CAPS
50.0000 mg | ORAL_CAPSULE | Freq: Once | ORAL | Status: AC
  Administered 2024-03-29: 50 mg via ORAL
  Filled 2024-03-29: qty 2

## 2024-03-29 MED ORDER — APREPITANT 130 MG/18ML IV EMUL
130.0000 mg | Freq: Once | INTRAVENOUS | Status: AC
  Administered 2024-03-29: 130 mg via INTRAVENOUS
  Filled 2024-03-29: qty 18

## 2024-03-29 MED ORDER — FAM-TRASTUZUMAB DERUXTECAN-NXKI CHEMO 100 MG IV SOLR
5.4000 mg/kg | Freq: Once | INTRAVENOUS | Status: AC
  Administered 2024-03-29: 500 mg via INTRAVENOUS
  Filled 2024-03-29: qty 25

## 2024-03-29 MED ORDER — DEXAMETHASONE SODIUM PHOSPHATE 10 MG/ML IJ SOLN
10.0000 mg | Freq: Once | INTRAMUSCULAR | Status: AC
  Administered 2024-03-29: 10 mg via INTRAVENOUS
  Filled 2024-03-29: qty 1

## 2024-03-29 MED ORDER — SODIUM CHLORIDE 0.9% FLUSH
10.0000 mL | Freq: Once | INTRAVENOUS | Status: AC
  Administered 2024-03-29: 10 mL

## 2024-03-29 MED ORDER — ACETAMINOPHEN 325 MG PO TABS
650.0000 mg | ORAL_TABLET | Freq: Once | ORAL | Status: AC
  Administered 2024-03-29: 650 mg via ORAL
  Filled 2024-03-29: qty 2

## 2024-03-29 MED ORDER — PALONOSETRON HCL INJECTION 0.25 MG/5ML
0.2500 mg | Freq: Once | INTRAVENOUS | Status: AC
  Administered 2024-03-29: 0.25 mg via INTRAVENOUS
  Filled 2024-03-29: qty 5

## 2024-03-29 MED ORDER — DEXTROSE 5 % IV SOLN: INTRAVENOUS | Status: DC

## 2024-03-29 NOTE — Patient Instructions (Signed)
 [facility] - A DEPT OF [facility]  Discharge Instructions: Thank you for choosing [facility] to provide your oncology and hematology care.   If you have a lab appointment with the Cancer Center, please go directly to the Cancer Center and check in at the registration area.   Wear comfortable clothing and clothing appropriate for easy access to any Portacath or PICC line.   We strive to give you quality time with your provider. You may need to reschedule your appointment if you arrive late (15 or more minutes).  Arriving late affects you and other patients whose appointments are after yours.  Also, if you miss three or more appointments without notifying the office, you may be dismissed from the clinic at the provider's discretion.      For prescription refill requests, have your pharmacy contact our office and allow 72 hours for refills to be completed.    Today you received the following chemotherapy and/or immunotherapy agents Enhertu      To help prevent nausea and vomiting after your treatment, we encourage you to take your nausea medication as directed.  BELOW ARE SYMPTOMS THAT SHOULD BE REPORTED IMMEDIATELY: *FEVER GREATER THAN 100.4 F (38 C) OR HIGHER *CHILLS OR SWEATING *NAUSEA AND VOMITING THAT IS NOT CONTROLLED WITH YOUR NAUSEA MEDICATION *UNUSUAL SHORTNESS OF BREATH *UNUSUAL BRUISING OR BLEEDING *URINARY PROBLEMS (pain or burning when urinating, or frequent urination) *BOWEL PROBLEMS (unusual diarrhea, constipation, pain near the anus) TENDERNESS IN MOUTH AND THROAT WITH OR WITHOUT PRESENCE OF ULCERS (sore throat, sores in mouth, or a toothache) UNUSUAL RASH, SWELLING OR PAIN  UNUSUAL VAGINAL DISCHARGE OR ITCHING   Items with * indicate a potential emergency and should be followed up as soon as possible or go to the Emergency Department if any problems should occur.  Please show the CHEMOTHERAPY ALERT CARD or IMMUNOTHERAPY  ALERT CARD at check-in to the Emergency Department and triage nurse.  Should you have questions after your visit or need to cancel or reschedule your appointment, please contact [facility]  Dept: [phone]  and follow the prompts.  Office hours are 8:00 a.m. to 4:30 p.m. Monday - Friday. Please note that voicemails left after 4:00 p.m. may not be returned until the following business day.  We are closed weekends and major holidays. You have access to a nurse at all times for urgent questions. Please call the main number to the clinic Dept: [phone] and follow the prompts.   For any non-urgent questions, you may also contact your provider using MyChart. We now offer e-Visits for anyone 58 and older to request care online for non-urgent symptoms. For details visit [email or web address].   Also download the MyChart app! Go to the app store, search "MyChart", open the app, select [place], and log in with your MyChart username and password.  Fam-Trastuzumab Deruxtecan Injection What is this medication? FAM-TRASTUZUMAB DERUXTECAN (fam-tras TOOZ eu mab DER ux TEE kan) treats some types of cancer. It works by blocking a protein that causes cancer cells to grow and multiply. This helps to slow or stop the spread of cancer cells. This medicine may be used for other purposes; ask your health care provider or pharmacist if you have questions. COMMON BRAND NAME(S): ENHERTU What should I tell my care team before I take this medication? They need to know if you have any of these  conditions: Heart disease Heart failure Infection, especially a viral infection, such as chickenpox, cold sores, or herpes Liver disease Lung or breathing disease, such as asthma or COPD An unusual or allergic reaction to fam-trastuzumab deruxtecan, other medications, foods, dyes, or preservatives Pregnant or trying to get pregnant Breast-feeding How should I use  this medication? This medication is injected into a vein. It is given by your care team in a hospital or clinic setting. A special MedGuide will be given to you before each treatment. Be sure to read this information carefully each time. Talk to your care team about the use of this medication in children. Special care may be needed. Overdosage: If you think you have taken too much of this medicine contact a poison control center or emergency room at once. NOTE: This medicine is only for you. Do not share this medicine with others. What if I miss a dose? It is important not to miss your dose. Call your care team if you are unable to keep an appointment. What may interact with this medication? Interactions are not expected. This list may not describe all possible interactions. Give your health care provider a list of all the medicines, herbs, non-prescription drugs, or dietary supplements you use. Also tell them if you smoke, drink alcohol, or use illegal drugs. Some items may interact with your medicine. What should I watch for while using this medication? Visit your care team for regular checks on your progress. Tell your care team if your symptoms do not start to get better or if they get worse. This medication may increase your risk of getting an infection. Call your care team for advice if you get a fever, chills, sore throat, or other symptoms of a cold or flu. Do not treat yourself. Try to avoid being around people who are sick. Avoid taking medications that contain aspirin, acetaminophen, ibuprofen, naproxen, or ketoprofen unless instructed by your care team. These medications may hide a fever. Be careful brushing or flossing your teeth or using a toothpick because you may get an infection or bleed more easily. If you have any dental work done, tell your dentist you are receiving this medication. This medication may cause dry eyes and blurred vision. If you wear contact lenses, you may feel  some discomfort. Lubricating eye drops may help. See your care team if the problem does not go away or is severe. Talk to your care team if you may be pregnant. Serious birth defects can occur if you take this medication during pregnancy and for 7 months after the last dose. If your partner can get pregnant, use a condom during sex while taking this medication and for 4 months after the last dose. Do not breastfeed while taking this medication and for 7 months after the last dose. This medication may cause infertility. Talk to your care team if you are concerned about your fertility. What side effects may I notice from receiving this medication? Side effects that you should report to your care team as soon as possible: Allergic reactions--skin rash, itching, hives, swelling of the face, lips, tongue, or throat Dry cough, shortness of breath or trouble breathing Infection--fever, chills, cough, sore throat, wounds that don't heal, pain or trouble when passing urine, general feeling of discomfort or being unwell Heart failure--shortness of breath, swelling of the ankles, feet, or hands, sudden weight gain, unusual weakness or fatigue Unusual bruising or bleeding Side effects that usually do not require medical attention (report these to your care  team if they continue or are bothersome): Constipation Diarrhea Hair loss Muscle pain Nausea Vomiting This list may not describe all possible side effects. Call your doctor for medical advice about side effects. You may report side effects to FDA at 1-800-FDA-1088. Where should I keep my medication? This medication is given in a hospital or clinic. It will not be stored at home. NOTE: This sheet is a summary. It may not cover all possible information. If you have questions about this medicine, talk to your doctor, pharmacist, or health care provider.  2024 Elsevier/Gold Standard (2023-06-04 00:00:00)

## 2024-03-29 NOTE — Progress Notes (Signed)
 Port placement at Sinoatrial junction sufficient placement per Oncology RN educators Duncan Gibson, RN and Ardean Kohl, RN

## 2024-03-30 ENCOUNTER — Telehealth: Payer: Self-pay

## 2024-03-30 NOTE — Telephone Encounter (Signed)
 Ms. Rayos states that she is doing fine. She is eating, drinking, and urinating well. She knows to call the office at [phone] if  she has any questions or concerns.

## 2024-03-30 NOTE — Telephone Encounter (Signed)
-----   Message from Nurse Cathren Coaster sent at 03/29/2024  3:20 PM EDT ----- Regarding: First time Enhertu. Pt of Feng. First time Enhertu. Pt of Feng. Tolerated infusion well. No adverse s/s. VSS. Please call to check in with patient when possible, thanks!

## 2024-04-03 ENCOUNTER — Other Ambulatory Visit: Payer: Self-pay

## 2024-04-03 DIAGNOSIS — Z170 Estrogen receptor positive status [ER+]: Secondary | ICD-10-CM

## 2024-04-03 NOTE — Assessment & Plan Note (Addendum)
-  cT1cN1M0, ER+/PR+/HER2+ -presented with screening discovered left breast cancer with nodal metastasis.  - Breast MRI showed 2.5 cm mass in the upper inner quadrant of left breast, additional suspicious clumped linear non-mass enhancement in the retroareolar left measuring 5 cm, and additional small area of non-mass enhancement in the lateral left breast spanning 1.1 cm, 1.3 centimeter abnormal lymph node in the left low axilla, no additional abnormal lymph nodes. - First dose IV Enhertu  given 03/29/2024.  She did have echocardiogram 03/18/2024.  She was noted to have abnormal strain however, there was a poor tracing on the apical segments.  LVEF was normal between 60 and 65%.  There were no regional wall motion abnormalities of the left ventricle. -04/04/2024 -she presents for toxicity check.  Overall, tolerating well.  Will continue with cycle 2-day 1 Enhertu  as scheduled on 04/20/2019

## 2024-04-03 NOTE — Progress Notes (Unsigned)
 Patient Care Team: McDiarmid, Krystal BIRCH, MD as PCP - General [name], MD as Consulting Physician (Ophthalmology) Mavis Purchase, MD as Consulting Physician (Neurosurgery) Jaye Fallow, MD as Consulting Physician (Ophthalmology) Charlsie Josette SAILOR, RN as [facility]an Stephane BROCKS, RN as Oncology Nurse Navigator Tyree Nanetta SAILOR, RN as Oncology Nurse Navigator Lanny Callander, MD as Consulting Physician (Hematology)  Clinic Day:  04/04/2024  Referring physician: McDiarmid, Krystal BIRCH, MD  ASSESSMENT & PLAN:   Assessment & Plan: Malignant neoplasm of upper-inner quadrant of left breast in female, estrogen receptor positive (HCC) -cT1cN1M0, ER+/PR+/HER2+ -presented with screening discovered left breast cancer with nodal metastasis.  - Breast MRI showed 2.5 cm mass in the upper inner quadrant of left breast, additional suspicious clumped linear non-mass enhancement in the retroareolar left measuring 5 cm, and additional small area of non-mass enhancement in the lateral left breast spanning 1.1 cm, 1.3 centimeter abnormal lymph node in the left low axilla, no additional abnormal lymph nodes. - First dose IV Enhertu  given 03/29/2024.  She did have echocardiogram 03/18/2024.  She was noted to have abnormal strain however, there was a poor tracing on the apical segments.  LVEF was normal between 60 and 65%.  There were no regional wall motion abnormalities of the left ventricle. -04/04/2024 -she presents for toxicity check.    The patient understands the plans discussed today and is in agreement with them.  She knows to contact our office if she develops concerns prior to her next appointment.  I provided *** minutes of face-to-face time during this encounter and > 50% was spent counseling as documented under my assessment and plan.    Powell FORBES Lessen, NP  [facility] - A DEPT OF [facility] [address] [place]  [place] [address] Dept: [phone] Dept Fax: [phone]   No orders of the defined types were placed in this encounter.     CHIEF COMPLAINT:  CC: Breast cancer, triple positive  Current Treatment: Enhertu   INTERVAL HISTORY:  [name] is here today for repeat clinical assessment.  She was last seen by [name] on 03/22/2024.  Initial treatment labs 03/29/2024.  Today, 04/04/2024, she presents for toxicity check.  She denies fevers or chills. She denies pain. Her appetite is good. Her weight {Weight change:10426}.  I have reviewed the past medical history, past surgical history, social history and family history with the patient and they are unchanged from previous note.  ALLERGIES:  is allergic to januvia  [sitagliptin ], jardiance  [empagliflozin ], amlodipine , celecoxib, and diclofenac-misoprostol.  MEDICATIONS:  Current Outpatient Medications  Medication Sig Dispense Refill   amLODipine -benazepril  (LOTREL) 10-20 MG capsule TAKE 1 CAPSULE BY MOUTH DAILY 90 capsule 3   Apoaequorin (PREVAGEN PO) Take by mouth.     atorvastatin  (LIPITOR) 40 MG tablet Take 1 tablet (40 mg total) by mouth daily. 90 tablet 3   blood glucose meter kit and supplies KIT Dispense based on patient and insurance preference. Use up to four times daily as directed. 1 each 0   Blood Glucose Monitoring Suppl DEVI 1 each by Does not apply route in the morning, at noon, and at bedtime. May substitute to any manufacturer covered by patient's insurance. 1 each 0   carvedilol  (COREG ) 6.25 MG tablet TAKE 1 TABLET(6.25 MG) BY MOUTH TWICE DAILY WITH A MEAL 180 tablet 3   fexofenadine  (ALLEGRA ) 180 MG tablet Take 180 mg by mouth daily.     fluconazole  (DIFLUCAN )  150 MG tablet TAKE 1 TABLET BY MOUTH, THEN AGAIN IN 3 DAYS 2 tablet 3   gabapentin  (NEURONTIN ) 100 MG capsule TAKE 1 CAPSULE(100 MG) BY MOUTH THREE TIMES DAILY AS NEEDED 270 capsule 1   hydrochlorothiazide  (HYDRODIURIL ) 25 MG tablet TAKE 1 TABLET(25 MG) BY MOUTH DAILY 90 tablet 3    HYDROcodone -acetaminophen  (NORCO) 7.5-325 MG tablet Take 1 tablet by mouth every 8 (eight) hours as needed for moderate pain (pain score 4-6). 90 tablet 0   metFORMIN  (GLUCOPHAGE -XR) 500 MG 24 hr tablet TAKE 1 TABLET(500 MG) BY MOUTH TWICE DAILY WITH A MEAL 180 tablet 3   methylphenidate  (RITALIN ) 10 MG tablet Take 2 tablets (20 mg total) by mouth 3 (three) times daily with meals. 180 tablet 0   naproxen  (NAPROSYN ) 500 MG tablet TAKE 1 TABLET(500 MG) BY MOUTH TWICE DAILY WITH A MEAL 180 tablet 3   ondansetron  (ZOFRAN ) 8 MG tablet Take 1 tablet (8 mg total) by mouth every 8 (eight) hours as needed for nausea or vomiting. Start on the third day after chemotherapy. 30 tablet 1   OVER THE COUNTER MEDICATION Store brand Ducolax stool softener. One capsule daily.     prochlorperazine  (COMPAZINE ) 10 MG tablet Take 1 tablet (10 mg total) by mouth every 6 (six) hours as needed for nausea or vomiting. 30 tablet 1   vitamin B-12 (CYANOCOBALAMIN) 1000 MCG tablet Take 1 tablet (1,000 mcg total) by mouth daily. 180 tablet 3   No current facility-administered medications for this visit.    HISTORY OF PRESENT ILLNESS:   Oncology History  Malignant neoplasm of upper-inner quadrant of left breast in female, estrogen receptor positive (HCC)  02/25/2024 Cancer Staging   Staging form: Breast, AJCC 8th Edition - Clinical stage from 02/25/2024: Stage IB (cT1c, cN1, cM0, G3, ER+, PR+, HER2+) - Signed by Lanny Callander, MD on 03/07/2024 Stage prefix: Initial diagnosis Histologic grading system: 3 grade system   03/07/2024 Initial Diagnosis   Malignant neoplasm of upper-inner quadrant of left breast in female, estrogen receptor positive (HCC)   03/29/2024 -  Chemotherapy   Patient is on Treatment Plan : BREAST Fam-Trastuzumab Deruxtecan-nxki  (Enhertu ) (5.4) q21d         REVIEW OF SYSTEMS:   Constitutional: Denies fevers, chills or abnormal weight loss Eyes: Denies blurriness of vision Ears, nose, mouth, throat, and  face: Denies mucositis or sore throat Respiratory: Denies cough, dyspnea or wheezes Cardiovascular: Denies palpitation, chest discomfort or lower extremity swelling Gastrointestinal:  Denies nausea, heartburn or change in bowel habits Skin: Denies abnormal skin rashes Lymphatics: Denies new lymphadenopathy or easy bruising Neurological:Denies numbness, tingling or new weaknesses Behavioral/Psych: Mood is stable, no new changes  All other systems were reviewed with the patient and are negative.   VITALS:   Today's Vitals   04/04/24 1011  BP: (!) 142/60  Pulse: 74  Resp: 18  Temp: 97.6 F (36.4 C)  TempSrc: Temporal  SpO2: 99%  Weight: 193 lb 6.4 oz (87.7 kg)  Height: 5' 2 (1.575 m)  PainSc: 0-No pain   Body mass index is 35.37 kg/m.   Wt Readings from Last 3 Encounters:  04/04/24 193 lb 6.4 oz (87.7 kg)  03/29/24 199 lb 8 oz (90.5 kg)  03/28/24 198 lb 6.6 oz (90 kg)    Body mass index is 35.37 kg/m.  Performance status (ECOG): {CHL ONC [id number]  PHYSICAL EXAM:   GENERAL:alert, no distress and comfortable SKIN: skin color, texture, turgor are normal, no rashes or significant lesions EYES: normal,  Conjunctiva are pink and non-injected, sclera clear OROPHARYNX:no exudate, no erythema and lips, buccal mucosa, and tongue normal  NECK: supple, thyroid  normal size, non-tender, without nodularity LYMPH:  no palpable lymphadenopathy in the cervical, axillary or inguinal LUNGS: clear to auscultation and percussion with normal breathing effort HEART: regular rate & rhythm and no murmurs and no lower extremity edema ABDOMEN:abdomen soft, non-tender and normal bowel sounds Musculoskeletal:no cyanosis of digits and no clubbing  NEURO: alert & oriented x 3 with fluent speech, no focal motor/sensory deficits  LABORATORY DATA:  I have reviewed the data as listed    Component Value Date/Time   NA 135 03/29/2024 1132   NA 137 01/21/2023 1445   K 4.1 03/29/2024 1132   CL  100 03/29/2024 1132   CO2 26 03/29/2024 1132   GLUCOSE 252 (H) 03/29/2024 1132   BUN 19 03/29/2024 1132   BUN 22 01/21/2023 1445   CREATININE 0.85 03/29/2024 1132   CREATININE 0.73 01/15/2016 1436   CALCIUM  9.4 03/29/2024 1132   PROT 7.4 03/29/2024 1132   PROT 7.1 11/26/2022 1410   ALBUMIN 4.0 03/29/2024 1132   ALBUMIN 4.2 11/26/2022 1410   AST 17 03/29/2024 1132   ALT 18 03/29/2024 1132   ALKPHOS 56 03/29/2024 1132   BILITOT 0.3 03/29/2024 1132   GFRNONAA >60 03/29/2024 1132   GFRNONAA 86 01/15/2016 1436   GFRAA 104 10/17/2020 1552   GFRAA >89 01/15/2016 1436    No results found for: SPEP, UPEP  Lab Results  Component Value Date   WBC 5.7 04/04/2024   NEUTROABS 3.6 04/04/2024   HGB 11.1 (L) 04/04/2024   HCT 32.5 (L) 04/04/2024   MCV 77.9 (L) 04/04/2024   PLT 248 04/04/2024      Chemistry      Component Value Date/Time   NA 135 03/29/2024 1132   NA 137 01/21/2023 1445   K 4.1 03/29/2024 1132   CL 100 03/29/2024 1132   CO2 26 03/29/2024 1132   BUN 19 03/29/2024 1132   BUN 22 01/21/2023 1445   CREATININE 0.85 03/29/2024 1132   CREATININE 0.73 01/15/2016 1436      Component Value Date/Time   CALCIUM  9.4 03/29/2024 1132   ALKPHOS 56 03/29/2024 1132   AST 17 03/29/2024 1132   ALT 18 03/29/2024 1132   BILITOT 0.3 03/29/2024 1132       RADIOGRAPHIC STUDIES: I have personally reviewed the radiological images as listed and agreed with the findings in the report. IR IMAGING GUIDED PORT INSERTION Result Date: 03/28/2024 CLINICAL DATA:  Left-sided breast cancer EXAM: Chest port catheter placement TECHNIQUE: Procedure performed using fluoroscopy and ultrasound CONTRAST:  None RADIOPHARMACEUTICALS:  None FLUOROSCOPY: Five mGy COMPARISON:  None FINDINGS: The patient was placed in supine position on the IR gantry and the right upper chest and neck were prepped and draped in the usual sterile fashion. The nurse administered intravenous fentanyl  and Versed  under my  supervision and the nurse had no other injuries other than monitoring the patient and administering medications. I was present for the entire duration of procedure. 2 mg of intravenous Versed , 100 mcg of intravenous fentanyl  were administered for a total of 20 minutes of sedation time. Ultrasound guidance was used to investigate the right internal jugular vein which was anechoic and compressible indicating patency. The needle was then advanced from a scan negative through the soft tissue into the right internal jugular vein under ultrasound guidance. A final image was obtained and stored in the patient's permanent medical  record. Access was then exchanged over a guidewire which was advanced under fluoroscopic guidance. The needle was removed and replaced with a micropuncture sheath. Approximately 2 inches below the clavicle the port pocket was created with a subsequent incision. The catheter was then tunneled from the port pocket to the venotomy site overlying the right internal jugular vein. Access was then exchanged over an 035 guidewire for peel-away sheath which was advanced over the guidewire under fluoroscopic guidance. The catheter was then advanced through the peel-away sheath to the sinoatrial junction. Sheath was removed. The catheter was then cut at the port pocket and connected to chest port. The chest port was tested for function and finally function well. The chest port was then flushed with heparin  and a port pocket was closed with 4-0 suture. Final image was obtained demonstrating satisfactory position of chest port. The final count of all materials was satisfactory. IMPRESSION: 1. Satisfactory placement of right internal jugular vein single-lumen chest port. The catheter tip is at the sinoatrial junction. 2.  Okay to use and power inject chest port. Electronically Signed   By: Cordella Banner   On: 03/28/2024 10:45   NM PET Image Initial (PI) Skull Base To Thigh Result Date: 03/22/2024 CLINICAL  DATA:  Subsequent treatment strategy for breast carcinoma. LEFT breast carcinoma. ER receptor positive. EXAM: NUCLEAR MEDICINE PET SKULL BASE TO THIGH TECHNIQUE: 9.8 mCi F-18 FDG was injected intravenously. Full-ring PET imaging was performed from the skull base to thigh after the radiotracer. CT data was obtained and used for attenuation correction and anatomic localization. Fasting blood glucose: 172 mg/dl COMPARISON:  None Available. FINDINGS: NECK: No hypermetabolic lymph nodes in the neck.  No edge Incidental CT findings: None. CHEST: No hypermetabolic lesion within the breast tissue. Single mildly hypermetabolic LEFT axial lymph node measures 10 mm short axis SUV max equal 3.1 on image 76. No hypermetabolic central thoracic lymph nodes. No suspicious pulmonary nodules. Incidental CT findings: None. ABDOMEN/PELVIS: No abnormal hypermetabolic activity within the liver, pancreas, adrenal glands, or spleen. No hypermetabolic lymph nodes in the abdomen or pelvis. Scattered metabolic activity within the colon is physiologic. Incidental CT findings: Uterus and adnexa unremarkable. SKELETON: No focal hypermetabolic activity to suggest skeletal metastasis. Incidental CT findings: Anterior cervical fusion. IMPRESSION: 1. Single mildly hypermetabolic LEFT axillary lymph node is indeterminate. 2. No evidence of metastatic adenopathy in the neck, chest, abdomen, or pelvis. 3. No evidence of visceral metastasis or skeletal metastasis. Electronically Signed   By: Jackquline Boxer M.D.   On: 03/22/2024 10:54   ECHOCARDIOGRAM COMPLETE Result Date: 03/17/2024    ECHOCARDIOGRAM REPORT   Patient Name:   [name] Date of Exam: 03/17/2024 Medical Rec #:  [id number]    Height:       62.0 in Accession #:    [id number]   Weight:       197.6 lb Date of Birth:  1951/01/15    BSA:          1.902 m Patient Age:    73 years     BP:           151/78 mmHg Patient Gender: F            HR:           88 bpm. Exam Location:  Outpatient  Procedure: 2D Echo, Cardiac Doppler and Color Doppler (Both Spectral and Color            Flow Doppler were utilized during procedure). Indications:  Chemo  History:        Patient has no prior history of Echocardiogram examinations.                 Risk Factors:Hypertension and Diabetes.  Sonographer:    Eva Lash Referring Phys: [id number] LACIE K BURTON IMPRESSIONS  1. Abnormal strain, but there is poor tracking of the apical segments. Left ventricular ejection fraction, by estimation, is 60 to 65%. The left ventricle has normal function. The left ventricle has no regional wall motion abnormalities. Left ventricular diastolic parameters were normal. The average left ventricular global longitudinal strain is -15.9 %. The global longitudinal strain is abnormal.  2. Right ventricular systolic function is normal. The right ventricular size is normal.  3. The mitral valve is normal in structure. Trivial mitral valve regurgitation. No evidence of mitral stenosis.  4. The aortic valve is tricuspid. Aortic valve regurgitation is not visualized. No aortic stenosis is present.  5. The inferior vena cava is normal in size with greater than 50% respiratory variability, suggesting right atrial pressure of 3 mmHg. FINDINGS  Left Ventricle: Abnormal strain, but there is poor tracking of the apical segments. Left ventricular ejection fraction, by estimation, is 60 to 65%. The left ventricle has normal function. The left ventricle has no regional wall motion abnormalities. Definity  contrast agent was given IV to delineate the left ventricular endocardial borders. The average left ventricular global longitudinal strain is -15.9 %. Strain was performed and the global longitudinal strain is abnormal. The left ventricular internal cavity size was normal in size. There is no left ventricular hypertrophy. Left ventricular diastolic parameters were normal. Right Ventricle: The right ventricular size is normal. No increase in right  ventricular wall thickness. Right ventricular systolic function is normal. Left Atrium: Left atrial size was normal in size. Right Atrium: Right atrial size was normal in size. Pericardium: There is no evidence of pericardial effusion. Mitral Valve: The mitral valve is normal in structure. Mild mitral annular calcification. Trivial mitral valve regurgitation. No evidence of mitral valve stenosis. Tricuspid Valve: The tricuspid valve is normal in structure. Tricuspid valve regurgitation is trivial. No evidence of tricuspid stenosis. Aortic Valve: The aortic valve is tricuspid. Aortic valve regurgitation is not visualized. No aortic stenosis is present. Aortic valve mean gradient measures 4.0 mmHg. Aortic valve peak gradient measures 8.1 mmHg. Aortic valve area, by VTI measures 1.64 cm. Pulmonic Valve: The pulmonic valve was normal in structure. Pulmonic valve regurgitation is not visualized. No evidence of pulmonic stenosis. Aorta: The aortic root is normal in size and structure. Venous: The inferior vena cava is normal in size with greater than 50% respiratory variability, suggesting right atrial pressure of 3 mmHg. IAS/Shunts: No atrial level shunt detected by color flow Doppler.  LEFT VENTRICLE PLAX 2D LVIDd:         4.30 cm      Diastology LVIDs:         2.60 cm      LV e' medial:    7.40 cm/s LV PW:         1.10 cm      LV E/e' medial:  14.3 LV IVS:        1.10 cm      LV e' lateral:   5.77 cm/s LVOT diam:     1.70 cm      LV E/e' lateral: 18.4 LV SV:         46 LV SV Index:   24  2D Longitudinal Strain LVOT Area:     2.27 cm     2D Strain GLS Avg:     -15.9 %  LV Volumes (MOD) LV vol d, MOD A2C: 123.0 ml LV vol d, MOD A4C: 101.0 ml LV vol s, MOD A2C: 30.3 ml LV vol s, MOD A4C: 32.2 ml LV SV MOD A2C:     92.7 ml LV SV MOD A4C:     101.0 ml LV SV MOD BP:      83.7 ml RIGHT VENTRICLE RV S prime:     12.70 cm/s TAPSE (M-mode): 1.8 cm LEFT ATRIUM           Index LA diam:      4.00 cm 2.10 cm/m LA Vol  (A4C): 44.8 ml 23.56 ml/m  AORTIC VALVE AV Area (Vmax):    1.61 cm AV Area (Vmean):   1.29 cm AV Area (VTI):     1.64 cm AV Vmax:           142.00 cm/s AV Vmean:          91.800 cm/s AV VTI:            0.278 m AV Peak Grad:      8.1 mmHg AV Mean Grad:      4.0 mmHg LVOT Vmax:         101.00 cm/s LVOT Vmean:        52.200 cm/s LVOT VTI:          0.201 m LVOT/AV VTI ratio: 0.72  AORTA Ao Asc diam: 2.90 cm MITRAL VALVE MV Area (PHT): 3.42 cm     SHUNTS MV Decel Time: 222 msec     Systemic VTI:  0.20 m MV E velocity: 106.00 cm/s  Systemic Diam: 1.70 cm MV A velocity: 111.00 cm/s MV E/A ratio:  0.95 Morene Brownie Electronically signed by Morene Brownie Signature Date/Time: 03/17/2024/4:35:23 PM    Final    MR BREAST BILATERAL W WO CONTRAST INC CAD Result Date: 03/16/2024 CLINICAL DATA:  Staging exam. 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 patient had benign right breast biopsies. EXAM: BILATERAL BREAST MRI WITH AND WITHOUT CONTRAST TECHNIQUE: Multiplanar, multisequence MR images of both breasts were obtained prior to and following the intravenous administration of 9 ml of Gadavist  Three-dimensional MR images were rendered by post-processing of the original MR data on an independent workstation. The three-dimensional MR images were interpreted, and findings are reported in the following complete MRI report for this study. Three dimensional images were evaluated at the independent interpreting workstation using the DynaCAD thin client. COMPARISON:  None available. FINDINGS: Breast composition: b. Scattered fibroglandular tissue. Background parenchymal enhancement: Mild Right breast: Biopsy-proven benign small masses noted in the upper outer anterior right breast. There is no suspicious enhancing mass or non mass enhancement elsewhere in the right breast. Left breast: Biopsy-proven metastatic intramammary lymph node is noted in the upper outer left breast  measuring 0.9 cm (series 1009, image 74). In the upper inner left breast there is clumped non mass enhancement measuring 2.5 x 1.3 x 0.5 cm consistent with biopsy proven malignancy. Extending from the nipple back there is suspicious linear clumped non mass enhancement spanning up to approximately 5 cm (series 1014 images 25 through 37). There is suspicious linear non mass enhancement in the lateral left breast spanning 1.1 cm (series 1014, image 44). Lymph nodes: Abnormal lymph node in the low left axilla with cortical thickness  measuring 1.3 cm consistent with biopsy proven metastatic lymph node. No additional abnormal lymph nodes in the bilateral axillary regions. Ancillary findings: Indeterminate T2 bright enhancing lesion in the sternum (series 6, image 56). IMPRESSION: 1. Biopsy-proven malignancy in the upper inner left breast measuring up to 2.5 cm. 2. Additional suspicious clumped linear non mass enhancement in the retroareolar left breast spanning up to 5 cm. 3. Additional small area of linear non mass enhancement in the lateral left breast spanning 1.1 cm. 4.  No MRI evidence of malignancy in the right breast. 5. Biopsy proven metastatic intramammary lymph node in the left breast and low left axillary node. No additional suspicious lymph nodes bilaterally. 6.  Indeterminate T2 bright enhancing lesion in the sternum. RECOMMENDATION: 1. If breast conservation is desired recommend MRI guided core needle biopsy x3 of the left breast targeting the anterior and posterior extent of the non mass enhancement in the retroareolar aspect as well as the non mass enhancement in the lateral left breast. 2. Bone scan and or cross-sectional imaging for further evaluation of the enhancing lesion in the sternum. BI-RADS CATEGORY  4: Suspicious. Electronically Signed   By: Inocente Ast M.D.   On: 03/16/2024 09:59

## 2024-04-04 ENCOUNTER — Inpatient Hospital Stay (HOSPITAL_BASED_OUTPATIENT_CLINIC_OR_DEPARTMENT_OTHER): Admitting: Nurse Practitioner

## 2024-04-04 ENCOUNTER — Inpatient Hospital Stay

## 2024-04-04 VITALS — BP 142/60 | HR 74 | Temp 97.6°F | Resp 18 | Ht 62.0 in | Wt 193.4 lb

## 2024-04-04 DIAGNOSIS — Z1721 Progesterone receptor positive status: Secondary | ICD-10-CM | POA: Diagnosis not present

## 2024-04-04 DIAGNOSIS — C50212 Malignant neoplasm of upper-inner quadrant of left female breast: Secondary | ICD-10-CM

## 2024-04-04 DIAGNOSIS — Z5112 Encounter for antineoplastic immunotherapy: Secondary | ICD-10-CM | POA: Diagnosis not present

## 2024-04-04 DIAGNOSIS — Z170 Estrogen receptor positive status [ER+]: Secondary | ICD-10-CM | POA: Diagnosis not present

## 2024-04-04 DIAGNOSIS — Z1731 Human epidermal growth factor receptor 2 positive status: Secondary | ICD-10-CM | POA: Diagnosis not present

## 2024-04-04 DIAGNOSIS — Z95828 Presence of other vascular implants and grafts: Secondary | ICD-10-CM

## 2024-04-04 LAB — CBC WITH DIFFERENTIAL (CANCER CENTER ONLY)
Abs Immature Granulocytes: 0.01 10*3/uL (ref 0.00–0.07)
Basophils Absolute: 0 10*3/uL (ref 0.0–0.1)
Basophils Relative: 0 %
Eosinophils Absolute: 0.2 10*3/uL (ref 0.0–0.5)
Eosinophils Relative: 4 %
HCT: 32.5 % — ABNORMAL LOW (ref 36.0–46.0)
Hemoglobin: 11.1 g/dL — ABNORMAL LOW (ref 12.0–15.0)
Immature Granulocytes: 0 %
Lymphocytes Relative: 31 %
Lymphs Abs: 1.8 10*3/uL (ref 0.7–4.0)
MCH: 26.6 pg (ref 26.0–34.0)
MCHC: 34.2 g/dL (ref 30.0–36.0)
MCV: 77.9 fL — ABNORMAL LOW (ref 80.0–100.0)
Monocytes Absolute: 0.1 10*3/uL (ref 0.1–1.0)
Monocytes Relative: 2 %
Neutro Abs: 3.6 10*3/uL (ref 1.7–7.7)
Neutrophils Relative %: 63 %
Platelet Count: 248 10*3/uL (ref 150–400)
RBC: 4.17 MIL/uL (ref 3.87–5.11)
RDW: 12.6 % (ref 11.5–15.5)
WBC Count: 5.7 10*3/uL (ref 4.0–10.5)
nRBC: 0 % (ref 0.0–0.2)

## 2024-04-04 LAB — CMP (CANCER CENTER ONLY)
ALT: 14 U/L (ref 0–44)
AST: 14 U/L — ABNORMAL LOW (ref 15–41)
Albumin: 3.8 g/dL (ref 3.5–5.0)
Alkaline Phosphatase: 50 U/L (ref 38–126)
Anion gap: 9 (ref 5–15)
BUN: 23 mg/dL (ref 8–23)
CO2: 25 mmol/L (ref 22–32)
Calcium: 9.5 mg/dL (ref 8.9–10.3)
Chloride: 97 mmol/L — ABNORMAL LOW (ref 98–111)
Creatinine: 1.02 mg/dL — ABNORMAL HIGH (ref 0.44–1.00)
GFR, Estimated: 58 mL/min — ABNORMAL LOW (ref >60)
Glucose, Bld: 169 mg/dL — ABNORMAL HIGH (ref 70–99)
Potassium: 4.1 mmol/L (ref 3.5–5.1)
Sodium: 131 mmol/L — ABNORMAL LOW (ref 135–145)
Total Bilirubin: 0.4 mg/dL (ref 0.0–1.2)
Total Protein: 7.2 g/dL (ref 6.5–8.1)

## 2024-04-04 MED ORDER — HEPARIN SOD (PORK) LOCK FLUSH 100 UNIT/ML IV SOLN
250.0000 [IU] | Freq: Once | INTRAVENOUS | Status: AC
  Administered 2024-04-04: 250 [IU]

## 2024-04-04 MED ORDER — SODIUM CHLORIDE 0.9% FLUSH
10.0000 mL | Freq: Once | INTRAVENOUS | Status: AC
  Administered 2024-04-04: 10 mL

## 2024-04-04 MED ORDER — LIDOCAINE-PRILOCAINE 2.5-2.5 % EX CREA
1.0000 | TOPICAL_CREAM | Freq: Every day | CUTANEOUS | 3 refills | Status: AC | PRN
Start: 2024-04-04 — End: ?

## 2024-04-05 ENCOUNTER — Other Ambulatory Visit: Payer: Self-pay

## 2024-04-05 DIAGNOSIS — M47817 Spondylosis without myelopathy or radiculopathy, lumbosacral region: Secondary | ICD-10-CM

## 2024-04-05 DIAGNOSIS — Q762 Congenital spondylolisthesis: Secondary | ICD-10-CM

## 2024-04-05 DIAGNOSIS — M48062 Spinal stenosis, lumbar region with neurogenic claudication: Secondary | ICD-10-CM

## 2024-04-05 DIAGNOSIS — F988 Other specified behavioral and emotional disorders with onset usually occurring in childhood and adolescence: Secondary | ICD-10-CM

## 2024-04-05 MED ORDER — HYDROCODONE-ACETAMINOPHEN 7.5-325 MG PO TABS: 1.0000 | ORAL_TABLET | Freq: Three times a day (TID) | ORAL | 0 refills | Status: DC | PRN

## 2024-04-05 MED ORDER — METHYLPHENIDATE HCL 10 MG PO TABS: 20.0000 mg | ORAL_TABLET | Freq: Three times a day (TID) | ORAL | 0 refills | Status: DC

## 2024-04-06 ENCOUNTER — Ambulatory Visit
Admission: RE | Admit: 2024-04-06 | Discharge: 2024-04-06 | Disposition: A | Discharge status: Home / Self Care | Source: Ambulatory Visit | Attending: Hematology | Admitting: Hematology

## 2024-04-06 ENCOUNTER — Ambulatory Visit
Admission: RE | Admit: 2024-04-06 | Discharge: 2024-04-06 | Disposition: A | Discharge status: Home / Self Care | Source: Ambulatory Visit | Attending: Hematology

## 2024-04-06 DIAGNOSIS — R92322 Mammographic fibroglandular density, left breast: Secondary | ICD-10-CM | POA: Diagnosis not present

## 2024-04-06 DIAGNOSIS — Z170 Estrogen receptor positive status [ER+]: Secondary | ICD-10-CM | POA: Diagnosis not present

## 2024-04-06 DIAGNOSIS — D0502 Lobular carcinoma in situ of left breast: Secondary | ICD-10-CM | POA: Diagnosis not present

## 2024-04-06 DIAGNOSIS — Z1721 Progesterone receptor positive status: Secondary | ICD-10-CM | POA: Diagnosis not present

## 2024-04-06 DIAGNOSIS — C50212 Malignant neoplasm of upper-inner quadrant of left female breast: Secondary | ICD-10-CM

## 2024-04-06 DIAGNOSIS — N6342 Unspecified lump in left breast, subareolar: Secondary | ICD-10-CM | POA: Diagnosis not present

## 2024-04-06 DIAGNOSIS — R928 Other abnormal and inconclusive findings on diagnostic imaging of breast: Secondary | ICD-10-CM

## 2024-04-06 DIAGNOSIS — C773 Secondary and unspecified malignant neoplasm of axilla and upper limb lymph nodes: Secondary | ICD-10-CM | POA: Diagnosis not present

## 2024-04-06 DIAGNOSIS — N6321 Unspecified lump in the left breast, upper outer quadrant: Secondary | ICD-10-CM | POA: Diagnosis not present

## 2024-04-06 DIAGNOSIS — R920 Mammographic microcalcification found on diagnostic imaging of breast: Secondary | ICD-10-CM | POA: Diagnosis not present

## 2024-04-06 MED ORDER — GADOPICLENOL 0.5 MMOL/ML IV SOLN
9.0000 mL | Freq: Once | INTRAVENOUS | Status: AC | PRN
  Administered 2024-04-06: 9 mL via INTRAVENOUS

## 2024-04-10 ENCOUNTER — Encounter: Payer: Self-pay | Admitting: Hematology

## 2024-04-10 LAB — SURGICAL PATHOLOGY

## 2024-04-13 ENCOUNTER — Encounter: Payer: Self-pay | Admitting: Nurse Practitioner

## 2024-04-13 ENCOUNTER — Encounter: Payer: Self-pay | Admitting: Hematology

## 2024-04-17 ENCOUNTER — Other Ambulatory Visit: Payer: Self-pay | Admitting: *Deleted

## 2024-04-17 ENCOUNTER — Encounter: Payer: Self-pay | Admitting: *Deleted

## 2024-04-17 NOTE — Patient Outreach (Unsigned)
 Complex Care Management   Visit Note  04/17/2024  Name:  [name] MRN: [id number] DOB: 04/25/1951  Situation: Referral received for Complex Care Management related to {Criteria:32550} I obtained verbal consent from {CHL AMB Patient/Caregiver:28184}.  Visit completed with ***  {VISIT LOCATION:32553}  Background:   Past Medical History:  Diagnosis Date   Acute MEE (middle ear effusion) 06/07/2014   Acute recurrent sinusitis 12/21/2014   Acute sinusitis 12/21/2014   Allergy    Anemia    hx   Asthma, intermittent 11/02/2018   ATTENTION DEFICIT, W/O HYPERACTIVITY 12/16/2006   Bilateral carpal tunnel syndrome 11/20/2016   Cataract    Cervical spondylosis with myelopathy, History of 04/28/2012   COLON POLYP 12/16/2006   (02/03/12) Surgical [Pathology], sigmoid colon, polyp HYPERPLASTIC POLYP(S) AND POLYPOID FRAGMETNS OF BENIGN COLONIC MUCOSA WITH INTRAMUCOSAL LYMPHOID AGGREGATES. NO ADENOMATOUS CHANGE OR MALIGNANCY IDENTIFIED.    DIABETES MELLITUS II, UNCOMPLICATED 12/16/2006   Fall 10/18/2020   GERD (gastroesophageal reflux disease)    H/O abnormal mammogram 07/19/2009   S/P Breast Biopsy [facility], [place], 07/2009): Benign findings.     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/20/2016   OSTEOARTHRITIS, MULTI SITES 12/16/2006   Perennial allergic rhinitis with seasonal variation 12/16/2006        PONV (postoperative  nausea and vomiting)    last surgery only   Recurrent maxillary sinusitis    SACROILIITIS, HISTORY OF 01/08/2009   Qualifier: History of  By: McDiarmid MD, Todd     Seasonal allergies    Seborrheic keratosis 01/15/2016   SKELETAL HYPEROSTOSIS 03/29/2008   Ulcer    Uterine fibroid 02/02/2011   TVUS: 4cm fibroid w/ submucosal component, bil.hydrosalpinges, unable measurable endometrium - 08/18/2004     Assessment: Patient Reported Symptoms:  Cognitive        Neurological      HEENT        Cardiovascular      Respiratory      Endocrine      Gastrointestinal        Genitourinary      Integumentary      Musculoskeletal          Psychosocial              03/20/2024    3:24 PM  Depression screen PHQ 2/9  Decreased Interest 0  Down, Depressed, Hopeless 0  PHQ - 2 Score 0    There were no vitals filed for this visit.  Medications Reviewed Today     Reviewed by Charlsie Josette SAILOR, RN (Registered Nurse) on 04/17/24 at 1725  Med List Status: <None>   Medication Order Taking? Sig Documenting Provider Last Dose Status Informant  amLODipine -benazepril  (LOTREL) 10-20 MG capsule [id number] No TAKE 1 CAPSULE BY MOUTH DAILY McDiarmid, Krystal BIRCH, MD 03/28/2024 Morning Active   Apoaequorin (PREVAGEN PO) [id number] No Take by mouth. [provider] 03/27/2024 Active  Self  atorvastatin  (LIPITOR) 40 MG tablet [id number] No Take 1 tablet (40 mg total) by mouth daily. McDiarmid, Krystal BIRCH, MD More than a month Active            Med Note ARBY DON   Tue Dec 01, 2022  1:05 PM) Taking every few days.  blood glucose meter kit and supplies KIT [id number] No Dispense based on patient and insurance preference. Use up to four times daily as directed. Christia Budds, MD More than a month Active   Blood Glucose Monitoring Suppl DEVI [id number] No 1 each by Does not apply route in the morning, at noon, and at bedtime. May substitute to any manufacturer covered by patient's insurance.  Christia Budds, MD More than a month Active   carvedilol  (COREG ) 6.25 MG tablet [id number] No TAKE 1 TABLET(6.25 MG) BY MOUTH TWICE DAILY WITH A MEAL McDiarmid, Krystal BIRCH, MD 03/28/2024  6:00 AM Active   fexofenadine  (ALLEGRA ) 180 MG tablet [id number] No Take 180 mg by mouth daily. [provider] Past Month Active Self  fluconazole  (DIFLUCAN ) 150 MG tablet [id number] No TAKE 1 TABLET BY MOUTH, THEN AGAIN IN 3 DAYS McDiarmid, Krystal BIRCH, MD More than a month Active            Med Note SCHUYLER JOSETTE LOISE Pablo Mar 20, 2024  3:29 PM) Takes PRN  gabapentin  (NEURONTIN ) 100 MG capsule [id number] No TAKE 1 CAPSULE(100 MG) BY MOUTH THREE TIMES DAILY AS NEEDED McDiarmid, Krystal BIRCH, MD 03/28/2024  6:00 AM Active   hydrochlorothiazide  (HYDRODIURIL ) 25 MG tablet [id number] No TAKE 1 TABLET(25 MG) BY MOUTH DAILY McDiarmid, Krystal BIRCH, MD 03/28/2024  6:00 AM Active   HYDROcodone -acetaminophen  (NORCO) 7.5-325 MG tablet [id number]  Take 1 tablet by mouth every 8 (eight) hours as needed for moderate pain (pain score 4-6). McDiarmid, Krystal BIRCH, MD  Active   lidocaine -prilocaine  (EMLA ) cream [id number]  Apply 1 Application topically daily as needed. Use as directed for port access Boscia, Heather E, NP  Active   metFORMIN  (GLUCOPHAGE -XR) 500 MG 24 hr tablet [id number] No TAKE 1 TABLET(500 MG) BY MOUTH TWICE DAILY WITH A MEAL McDiarmid, Krystal BIRCH, MD 03/28/2024  6:00 AM Active   methylphenidate  (RITALIN ) 10 MG tablet [id number]  Take 2 tablets (20 mg total) by mouth 3 (three) times daily with meals. McDiarmid, Krystal BIRCH, MD  Active   naproxen  (NAPROSYN ) 500 MG tablet [id number] No TAKE 1 TABLET(500 MG) BY MOUTH TWICE DAILY WITH A MEAL McDiarmid, Krystal BIRCH, MD 03/28/2024  6:00 AM Active   ondansetron  (ZOFRAN ) 8 MG tablet [id number] No Take 1 tablet (8 mg total) by mouth every 8 (eight) hours as needed for nausea or vomiting. Start on the third day after chemotherapy. Lanny Callander, MD Taking Active            Med Note LEOTHA LEBRON NOVAK   Tue Mar 28, 2024   9:25 AM) Hasn't started  OVER THE COUNTER MEDICATION [id number] No Store brand Ducolax stool softener. One capsule daily. [provider] 03/27/2024 Active   prochlorperazine  (COMPAZINE ) 10 MG tablet [id number] No Take 1 tablet (10 mg total) by mouth every 6 (six) hours as needed for nausea or vomiting. Lanny Callander, MD Taking Active            Med Note LEOTHA, LEBRON NOVAK   Tue Mar 28, 2024  9:26 AM) Hasn't started  vitamin B-12 (CYANOCOBALAMIN) 1000 MCG tablet [id number] No Take 1 tablet (1,000 mcg total) by mouth daily. McDiarmid,  Krystal BIRCH, MD 03/27/2024 Active             Recommendation:   {RECOMMENDATONS:32554}  Follow Up Plan:   {FOLLOWUP:32559}  SIG ***

## 2024-04-18 ENCOUNTER — Encounter: Payer: Self-pay | Admitting: Hematology

## 2024-04-18 NOTE — Assessment & Plan Note (Signed)
-  multifocal (UIQ, central, and lateral of left breast), UIQ cT3cN1M0, ER+/PR+/HER2+, central and lateral mass are invasive mammary carcinoma, favor lobular, ER/PR strongly positive, HER2 negative by FISH -presented with screening discovered left breast cancer with nodal metastasis.  - Breast MRI showed 2.5 cm mass in the upper inner quadrant of left breast, additional suspicious clumped linear non-mass enhancement in the retroareolar left measuring 5 cm, and additional small area of non-mass enhancement in the lateral left breast spanning 1.1 cm, 1.3 centimeter abnormal lymph node in the left low axilla, no additional abnormal lymph nodes. -She underwent additional biopsy of the retroareolar and lateral breast mass, which both showed invasive mammary carcinoma, favor lobular, both ER/PR strongly positive, HER2 negative by FISH. -PET negative for distant mets  -she started neoadjuvant Enhertu  on 03/29/2024 - Patient is not interested in surgery or intensive chemotherapy, plan to change her treatment to AI and HER2 antibodies as a maintenance therapy at some point.

## 2024-04-19 ENCOUNTER — Other Ambulatory Visit: Payer: Self-pay

## 2024-04-19 ENCOUNTER — Inpatient Hospital Stay: Attending: Nurse Practitioner

## 2024-04-19 ENCOUNTER — Inpatient Hospital Stay

## 2024-04-19 ENCOUNTER — Inpatient Hospital Stay (HOSPITAL_BASED_OUTPATIENT_CLINIC_OR_DEPARTMENT_OTHER): Admitting: Hematology

## 2024-04-19 VITALS — BP 142/53 | HR 89 | Temp 97.8°F | Resp 18 | Ht 62.0 in | Wt 197.4 lb

## 2024-04-19 DIAGNOSIS — D6481 Anemia due to antineoplastic chemotherapy: Secondary | ICD-10-CM | POA: Insufficient documentation

## 2024-04-19 DIAGNOSIS — T451X5A Adverse effect of antineoplastic and immunosuppressive drugs, initial encounter: Secondary | ICD-10-CM | POA: Insufficient documentation

## 2024-04-19 DIAGNOSIS — Z5112 Encounter for antineoplastic immunotherapy: Secondary | ICD-10-CM | POA: Insufficient documentation

## 2024-04-19 DIAGNOSIS — C50212 Malignant neoplasm of upper-inner quadrant of left female breast: Secondary | ICD-10-CM | POA: Diagnosis not present

## 2024-04-19 DIAGNOSIS — K521 Toxic gastroenteritis and colitis: Secondary | ICD-10-CM | POA: Insufficient documentation

## 2024-04-19 DIAGNOSIS — Z1721 Progesterone receptor positive status: Secondary | ICD-10-CM | POA: Insufficient documentation

## 2024-04-19 DIAGNOSIS — Z170 Estrogen receptor positive status [ER+]: Secondary | ICD-10-CM | POA: Diagnosis not present

## 2024-04-19 DIAGNOSIS — Z95828 Presence of other vascular implants and grafts: Secondary | ICD-10-CM

## 2024-04-19 DIAGNOSIS — L659 Nonscarring hair loss, unspecified: Secondary | ICD-10-CM | POA: Insufficient documentation

## 2024-04-19 DIAGNOSIS — Z1731 Human epidermal growth factor receptor 2 positive status: Secondary | ICD-10-CM | POA: Diagnosis not present

## 2024-04-19 LAB — CBC WITH DIFFERENTIAL (CANCER CENTER ONLY)
Abs Immature Granulocytes: 0.47 10*3/uL — ABNORMAL HIGH (ref 0.00–0.07)
Basophils Absolute: 0.1 10*3/uL (ref 0.0–0.1)
Basophils Relative: 1 %
Eosinophils Absolute: 0.7 10*3/uL — ABNORMAL HIGH (ref 0.0–0.5)
Eosinophils Relative: 7 %
HCT: 29.1 % — ABNORMAL LOW (ref 36.0–46.0)
Hemoglobin: 9.6 g/dL — ABNORMAL LOW (ref 12.0–15.0)
Immature Granulocytes: 5 %
Lymphocytes Relative: 31 %
Lymphs Abs: 3.3 10*3/uL (ref 0.7–4.0)
MCH: 26.4 pg (ref 26.0–34.0)
MCHC: 33 g/dL (ref 30.0–36.0)
MCV: 80.2 fL (ref 80.0–100.0)
Monocytes Absolute: 1 10*3/uL (ref 0.1–1.0)
Monocytes Relative: 9 %
Neutro Abs: 4.9 10*3/uL (ref 1.7–7.7)
Neutrophils Relative %: 47 %
Platelet Count: 697 10*3/uL — ABNORMAL HIGH (ref 150–400)
RBC: 3.63 MIL/uL — ABNORMAL LOW (ref 3.87–5.11)
RDW: 13.3 % (ref 11.5–15.5)
WBC Count: 10.4 10*3/uL (ref 4.0–10.5)
nRBC: 0 % (ref 0.0–0.2)

## 2024-04-19 LAB — CMP (CANCER CENTER ONLY)
ALT: 33 U/L (ref 0–44)
AST: 18 U/L (ref 15–41)
Albumin: 3 g/dL — ABNORMAL LOW (ref 3.5–5.0)
Alkaline Phosphatase: 52 U/L (ref 38–126)
Anion gap: 6 (ref 5–15)
BUN: 16 mg/dL (ref 8–23)
CO2: 27 mmol/L (ref 22–32)
Calcium: 8.4 mg/dL — ABNORMAL LOW (ref 8.9–10.3)
Chloride: 104 mmol/L (ref 98–111)
Creatinine: 0.81 mg/dL (ref 0.44–1.00)
GFR, Estimated: >60 mL/min (ref >60)
Glucose, Bld: 113 mg/dL — ABNORMAL HIGH (ref 70–99)
Potassium: 4.5 mmol/L (ref 3.5–5.1)
Sodium: 137 mmol/L (ref 135–145)
Total Bilirubin: 0.1 mg/dL (ref 0.0–1.2)
Total Protein: 5.7 g/dL — ABNORMAL LOW (ref 6.5–8.1)

## 2024-04-19 MED ORDER — ACETAMINOPHEN 325 MG PO TABS
650.0000 mg | ORAL_TABLET | Freq: Once | ORAL | Status: AC
  Administered 2024-04-19: 650 mg via ORAL
  Filled 2024-04-19: qty 2

## 2024-04-19 MED ORDER — DEXAMETHASONE SODIUM PHOSPHATE 10 MG/ML IJ SOLN
10.0000 mg | Freq: Once | INTRAMUSCULAR | Status: AC
  Administered 2024-04-19: 10 mg via INTRAVENOUS
  Filled 2024-04-19: qty 1

## 2024-04-19 MED ORDER — SODIUM CHLORIDE 0.9% FLUSH
10.0000 mL | INTRAVENOUS | Status: DC | PRN
  Administered 2024-04-19: 10 mL

## 2024-04-19 MED ORDER — APREPITANT 130 MG/18ML IV EMUL
130.0000 mg | Freq: Once | INTRAVENOUS | Status: AC
  Administered 2024-04-19: 130 mg via INTRAVENOUS
  Filled 2024-04-19: qty 18

## 2024-04-19 MED ORDER — HEPARIN SOD (PORK) LOCK FLUSH 100 UNIT/ML IV SOLN
500.0000 [IU] | Freq: Once | INTRAVENOUS | Status: AC | PRN
  Administered 2024-04-19: 500 [IU]

## 2024-04-19 MED ORDER — PALONOSETRON HCL INJECTION 0.25 MG/5ML
0.2500 mg | Freq: Once | INTRAVENOUS | Status: AC
  Administered 2024-04-19: 0.25 mg via INTRAVENOUS
  Filled 2024-04-19: qty 5

## 2024-04-19 MED ORDER — DIPHENHYDRAMINE HCL 25 MG PO CAPS
50.0000 mg | ORAL_CAPSULE | Freq: Once | ORAL | Status: AC
  Administered 2024-04-19: 50 mg via ORAL
  Filled 2024-04-19: qty 2

## 2024-04-19 MED ORDER — FAM-TRASTUZUMAB DERUXTECAN-NXKI CHEMO 100 MG IV SOLR
5.4000 mg/kg | Freq: Once | INTRAVENOUS | Status: AC
  Administered 2024-04-19: 500 mg via INTRAVENOUS
  Filled 2024-04-19: qty 25

## 2024-04-19 MED ORDER — DEXTROSE 5 % IV SOLN: INTRAVENOUS | Status: DC

## 2024-04-19 MED ORDER — SODIUM CHLORIDE 0.9% FLUSH
10.0000 mL | Freq: Once | INTRAVENOUS | Status: AC
  Administered 2024-04-19: 10 mL

## 2024-04-19 MED ORDER — DIPHENOXYLATE-ATROPINE 2.5-0.025 MG PO TABS: 1.0000 | ORAL_TABLET | Freq: Four times a day (QID) | ORAL | 0 refills | Status: DC | PRN

## 2024-04-19 NOTE — Patient Instructions (Signed)

## 2024-04-19 NOTE — Progress Notes (Signed)
 [facility]   Telephone:([phone] Fax:([phone]) [phone]   Clinic Follow up Note   Patient Care Team: McDiarmid, Krystal BIRCH, MD as PCP - General [name], MD as Consulting Physician (Ophthalmology) Mavis Purchase, MD as Consulting Physician (Neurosurgery) Jaye Fallow, MD as Consulting Physician (Ophthalmology) Charlsie Josette SAILOR, RN as [facility]an Stephane BROCKS, RN (Inactive) as Oncology Nurse Navigator Tyree Nanetta SAILOR, RN as Oncology Nurse Navigator Lanny Callander, MD as Consulting Physician (Hematology)  Date of Service:  04/19/2024  CHIEF COMPLAINT: f/u of left breast cancer  CURRENT THERAPY:  Enhertu  every 3 weeks  Oncology History   Malignant neoplasm of upper-inner quadrant of left breast in female, estrogen receptor positive (HCC) -multifocal (UIQ, central, and lateral of left breast), UIQ cT3cN1M0, ER+/PR+/HER2+, central and lateral mass are invasive mammary carcinoma, favor lobular, ER/PR strongly positive, HER2 negative by FISH -presented with screening discovered left breast cancer with nodal metastasis.  - Breast MRI showed 2.5 cm mass in the upper inner quadrant of left breast, additional suspicious clumped linear non-mass enhancement in the retroareolar left measuring 5 cm, and additional small area of non-mass enhancement in the lateral left breast spanning 1.1 cm, 1.3 centimeter abnormal lymph node in the left low axilla, no additional abnormal lymph nodes. -She underwent additional biopsy of the retroareolar and lateral breast mass, which both showed invasive mammary carcinoma, favor lobular, both ER/PR strongly positive, HER2 negative by FISH. -PET negative for distant mets  -she started neoadjuvant Enhertu  on 03/29/2024 - Patient is not interested in surgery or intensive chemotherapy, plan to change her treatment to AI and HER2 antibodies as a maintenance therapy at some point.  Assessment & Plan Breast cancer 73 year old female with  invasive mammary carcinoma with lobular features, grade 2, and lobular carcinoma in situ. Recent biopsy on June 19 confirmed invasive cancer in the left breast with additional non-mass enhancement behind the nipple. HER2 negative, ER 100% positive, PR 95% positive. Disease is more extensive than initially thought, with involvement in the upper inner quadrant and lateral areas. She declines surgical intervention despite it being the only curative option. The option of surgery remains open depending on response to chemotherapy. - Proceed with current chemotherapy regimen. - Repeat mammogram and breast MRI after four cycles of chemotherapy to evaluate response. - Discuss surgical options with a surgeon if she reconsiders.  Chemotherapy-induced diarrhea Severe diarrhea following chemotherapy, starting 3-4 days post-infusion. Initially managed with Imodium, which was ineffective. Pepto Bismol provided relief, reducing bowel movements to once or twice a day. No significant weight loss or dehydration reported. - Continue Pepto Bismol as needed for diarrhea. - Prescribe additional anti-diarrheal medication if Pepto Bismol is insufficient. - Encourage adequate hydration with electrolyzed water, Gatorade, Pedialyte, or liquid IV.  Anemia due to chemotherapy Mild anemia with hemoglobin at 9.6 g/dL. No immediate need for blood transfusion. - Monitor hemoglobin levels and consider blood transfusion if anemia worsens.  Chemotherapy-induced alopecia Significant hair loss reported, with hair coming out in handfuls. This is a common side effect of chemotherapy.  Plan -I reviewed her images and recent left breast biopsy results - I again discussed surgery is the only way to cure, patient is still not interested in surgery. - Lab reviewed, adequate for treatment, will proceed to cycle 2 Enhertu  today at the same dose - Plan to repeat breast mri after 4 cycles of therapy - Follow-up in 3 weeks before cycle 3  chemo - I called in Lomotil for her diarrhea as  needed -will review her biopsy with pathology    SUMMARY OF ONCOLOGIC HISTORY: Oncology History  Malignant neoplasm of upper-inner quadrant of left breast in female, estrogen receptor positive (HCC)  02/25/2024 Cancer Staging   Staging form: Breast, AJCC 8th Edition - Clinical stage from 02/25/2024: Stage IB (cT1c, cN1, cM0, G3, ER+, PR+, HER2+) - Signed by Lanny Callander, MD on 03/07/2024 Stage prefix: Initial diagnosis Histologic grading system: 3 grade system   03/07/2024 Initial Diagnosis   Malignant neoplasm of upper-inner quadrant of left breast in female, estrogen receptor positive (HCC)   03/29/2024 -  Chemotherapy   Patient is on Treatment Plan : BREAST Fam-Trastuzumab Deruxtecan-nxki  (Enhertu ) (5.4) q21d        Discussed the use of AI scribe software for clinical note transcription with the patient, who gave verbal consent to proceed.  History of Present Illness [name] is a 73 year old female with breast cancer who presents for follow-up.  She experiences severe diarrhea beginning three to four days post-chemotherapy infusion, occurring with each bathroom visit. Imodium was ineffective, but Pepto Bismol reduced frequency to once or twice daily. She maintains her weight and attempts to stay hydrated, though fluid intake adequacy is uncertain.  Significant hair loss is present, described as 'coming down by handfuls.' Nausea occurs but Zofran  is infrequently used. Her appetite is poor, and she is not taking nutritional supplements.  A recent biopsy on June 19th showed invasive mammary carcinoma with lobular features, grade 2, lobular carcinoma in situ, and ductal carcinoma in situ. ER is 100% positive, PR is 95% positive, and HER2 is negative. The third biopsy showed no invasive cancer but focal lobular carcinoma in situ.  No fever, chills, or palpable lumps.     All other systems were reviewed with the patient and are  negative.  MEDICAL HISTORY:  Past Medical History:  Diagnosis Date   Acute MEE (middle ear effusion) 06/07/2014   Acute recurrent sinusitis 12/21/2014   Acute sinusitis 12/21/2014   Allergy    Anemia    hx   Asthma, intermittent 11/02/2018   ATTENTION DEFICIT, W/O HYPERACTIVITY 12/16/2006   Bilateral carpal tunnel syndrome 11/20/2016   Cataract    Cervical spondylosis with myelopathy, History of 04/28/2012   COLON POLYP 12/16/2006   (02/03/12) Surgical [Pathology], sigmoid colon, polyp HYPERPLASTIC POLYP(S) AND POLYPOID FRAGMETNS OF BENIGN COLONIC MUCOSA WITH INTRAMUCOSAL LYMPHOID AGGREGATES. NO ADENOMATOUS CHANGE OR MALIGNANCY IDENTIFIED.    DIABETES MELLITUS II, UNCOMPLICATED 12/16/2006   Fall 10/18/2020   GERD (gastroesophageal reflux disease)    H/O abnormal mammogram 07/19/2009   S/P Breast Biopsy [facility], [place], 07/2009): Benign findings.     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/20/2016   OSTEOARTHRITIS, MULTI SITES 12/16/2006   Perennial allergic rhinitis with seasonal variation 12/16/2006        PONV (postoperative nausea and vomiting)    last surgery only  Recurrent maxillary sinusitis    SACROILIITIS, HISTORY OF 01/08/2009   Qualifier: History of  By: McDiarmid MD, Todd     Seasonal allergies    Seborrheic keratosis 01/15/2016   SKELETAL HYPEROSTOSIS 03/29/2008   Ulcer    Uterine fibroid  02/02/2011   TVUS: 4cm fibroid w/ submucosal component, bil.hydrosalpinges, unable measurable endometrium - 08/18/2004     SURGICAL HISTORY: Past Surgical History:  Procedure Laterality Date   ANTERIOR CERVICAL DECOMP/DISCECTOMY FUSION  04/28/2012   Procedure: ANTERIOR CERVICAL DECOMPRESSION/DISCECTOMY FUSION 3 LEVELS;  Surgeon: Reyes JONETTA Budge, MD;  Location: [facility];  Service: Neurosurgery;  Laterality: N/A;  Cervical three-four,Cervical four-five,Cervical five-six anterior cervical decompression with fusion interbody prothesis plating and bonegraft   BREAST BIOPSY  07/19/2009   S/P Breast Biopsy [facility], [place], 07/2009): Benign findings.  (10/27/2010)   BREAST BIOPSY Left 02/25/2024   US  LT BREAST BX W LOC DEV 1ST LESION IMG BX SPEC US  GUIDE 02/25/2024 [facility]   BREAST BIOPSY Left 02/25/2024   US  LT BREAST BX W LOC DEV EA ADD LESION IMG BX SPEC US  GUIDE 02/25/2024 [facility]   BREAST BIOPSY Right 03/02/2024   US  RT BREAST BX W LOC DEV 1ST LESION IMG BX SPEC US  GUIDE 03/02/2024 [facility]   BREAST LUMPECTOMY Left    CARPAL TUNNEL RELEASE Left 12/10/2016   Procedure: LEFT CARPAL TUNNEL RELEASE;  Surgeon: Murrell Kuba, MD;  Location: [facility];  Service: Orthopedics;  Laterality: Left;   COLONOSCOPY W/ POLYPECTOMY  12/17/2000   colonoscopy (Dr Kristie) int. hemorrhoids &  nonneoplatic colon polyps - 01/10/2001,    COLONOSCOPY W/ POLYPECTOMY  01/18/2012   Dr Albertus (GI).sigmoid colon, hyperlastic polyp   ENDOMETRIAL BIOPSY  08/19/2004   Endometrial Biopsy - 09/01/2004,   IR IMAGING GUIDED PORT INSERTION  03/28/2024   NM MYOVIEW  LTD  04/18/2004   Cardiolite:EF63%, no ischemia - 05/01/2004,    REPLACEMENT TOTAL KNEE BILATERAL      I have reviewed the social history and family history with the patient and they are unchanged from previous note.  ALLERGIES:  is allergic to januvia  [sitagliptin ], jardiance  [empagliflozin ], amlodipine ,  celecoxib, and diclofenac-misoprostol.  MEDICATIONS:  Current Outpatient Medications  Medication Sig Dispense Refill   amLODipine -benazepril  (LOTREL) 10-20 MG capsule TAKE 1 CAPSULE BY MOUTH DAILY 90 capsule 3   Apoaequorin (PREVAGEN PO) Take by mouth.     atorvastatin  (LIPITOR) 40 MG tablet Take 1 tablet (40 mg total) by mouth daily. 90 tablet 3   blood glucose meter kit and supplies KIT Dispense based on patient and insurance preference. Use up to four times daily as directed. 1 each 0   Blood Glucose Monitoring Suppl DEVI 1 each by Does not apply route in the morning, at noon, and at bedtime. May substitute to any manufacturer covered by patient's insurance. 1 each 0   carvedilol  (COREG ) 6.25 MG tablet TAKE 1 TABLET(6.25 MG) BY MOUTH TWICE DAILY WITH A MEAL 180 tablet 3   diphenoxylate-atropine (LOMOTIL) 2.5-0.025 MG tablet Take 1-2 tablets by mouth 4 (four) times daily as needed for diarrhea or loose stools. 30 tablet 0   fexofenadine  (ALLEGRA ) 180 MG tablet Take 180 mg by mouth daily.     fluconazole  (DIFLUCAN ) 150 MG tablet TAKE 1 TABLET BY MOUTH, THEN AGAIN IN 3 DAYS 2 tablet 3   gabapentin  (NEURONTIN ) 100 MG capsule TAKE 1 CAPSULE(100 MG) BY MOUTH THREE TIMES DAILY AS NEEDED 270 capsule 1   hydrochlorothiazide  (HYDRODIURIL ) 25  MG tablet TAKE 1 TABLET(25 MG) BY MOUTH DAILY 90 tablet 3   HYDROcodone -acetaminophen  (NORCO) 7.5-325 MG tablet Take 1 tablet by mouth every 8 (eight) hours as needed for moderate pain (pain score 4-6). 90 tablet 0   lidocaine -prilocaine  (EMLA ) cream Apply 1 Application topically daily as needed. Use as directed for port access 30 g 3   metFORMIN  (GLUCOPHAGE -XR) 500 MG 24 hr tablet TAKE 1 TABLET(500 MG) BY MOUTH TWICE DAILY WITH A MEAL 180 tablet 3   methylphenidate  (RITALIN ) 10 MG tablet Take 2 tablets (20 mg total) by mouth 3 (three) times daily with meals. 180 tablet 0   naproxen  (NAPROSYN ) 500 MG tablet TAKE 1 TABLET(500 MG) BY MOUTH TWICE DAILY WITH A MEAL 180  tablet 3   ondansetron  (ZOFRAN ) 8 MG tablet Take 1 tablet (8 mg total) by mouth every 8 (eight) hours as needed for nausea or vomiting. Start on the third day after chemotherapy. 30 tablet 1   OVER THE COUNTER MEDICATION Store brand Ducolax stool softener. One capsule daily.     prochlorperazine  (COMPAZINE ) 10 MG tablet Take 1 tablet (10 mg total) by mouth every 6 (six) hours as needed for nausea or vomiting. 30 tablet 1   vitamin B-12 (CYANOCOBALAMIN) 1000 MCG tablet Take 1 tablet (1,000 mcg total) by mouth daily. 180 tablet 3   No current facility-administered medications for this visit.   Facility-Administered Medications Ordered in Other Visits  Medication Dose Route Frequency Provider Last Rate Last Admin   dextrose  5 % solution   Intravenous Continuous Lanny Callander, MD 10 mL/hr at 04/19/24 1357 New Bag at 04/19/24 1357   fam-trastuzumab deruxtecan-nxki  (ENHERTU ) 500 mg in dextrose  5 % 100 mL chemo infusion  5.4 mg/kg (Treatment Plan Recorded) Intravenous Once Lanny Callander, MD 250 mL/hr at 04/19/24 1445 500 mg at 04/19/24 1445   heparin  lock flush 100 unit/mL  500 Units Intracatheter Once PRN Lanny Callander, MD       sodium chloride  flush (NS) 0.9 % injection 10 mL  10 mL Intracatheter PRN Lanny Callander, MD        PHYSICAL EXAMINATION: ECOG PERFORMANCE STATUS: 1 - Symptomatic but completely ambulatory  Vitals:   04/19/24 1316  BP: (!) 142/53  Pulse: 89  Resp: 18  Temp: 97.8 F (36.6 C)  SpO2: 100%   Wt Readings from Last 3 Encounters:  04/19/24 197 lb 6.4 oz (89.5 kg)  04/04/24 193 lb 6.4 oz (87.7 kg)  03/29/24 199 lb 8 oz (90.5 kg)     GENERAL:alert, no distress and comfortable SKIN: skin color, texture, turgor are normal, no rashes or significant lesions EYES: normal, Conjunctiva are pink and non-injected, sclera clear NECK: supple, thyroid  normal size, non-tender, without nodularity LYMPH:  no palpable lymphadenopathy in the cervical, axillary  LUNGS: clear to auscultation and  percussion with normal breathing effort HEART: regular rate & rhythm and no murmurs and no lower extremity edema ABDOMEN:abdomen soft, non-tender and normal bowel sounds Musculoskeletal:no cyanosis of digits and no clubbing  NEURO: alert & oriented x 3 with fluent speech, no focal motor/sensory deficits  Physical Exam    LABORATORY DATA:  I have reviewed the data as listed    Latest Ref Rng & Units 04/19/2024   12:50 PM 04/04/2024    9:44 AM 03/29/2024   11:32 AM  CBC  WBC 4.0 - 10.5 K/uL 10.4  5.7  7.7   Hemoglobin 12.0 - 15.0 g/dL 9.6  88.8  89.2   Hematocrit 36.0 - 46.0 %  29.1  32.5  32.4   Platelets 150 - 400 K/uL 697  248  277         Latest Ref Rng & Units 04/19/2024   12:50 PM 04/04/2024    9:44 AM 03/29/2024   11:32 AM  CMP  Glucose 70 - 99 mg/dL 886  830  747   BUN 8 - 23 mg/dL 16  23  19    Creatinine 0.44 - 1.00 mg/dL 9.18  8.97  9.14   Sodium 135 - 145 mmol/L 137  131  135   Potassium 3.5 - 5.1 mmol/L 4.5  4.1  4.1   Chloride 98 - 111 mmol/L 104  97  100   CO2 22 - 32 mmol/L 27  25  26    Calcium  8.9 - 10.3 mg/dL 8.4  9.5  9.4   Total Protein 6.5 - 8.1 g/dL 5.7  7.2  7.4   Total Bilirubin 0.0 - 1.2 mg/dL 0.1  0.4  0.3   Alkaline Phos 38 - 126 U/L 52  50  56   AST 15 - 41 U/L 18  14  17    ALT 0 - 44 U/L 33  14  18       RADIOGRAPHIC STUDIES: I have personally reviewed the radiological images as listed and agreed with the findings in the report. No results found.    Orders Placed This Encounter  Procedures   CBC with Differential (Cancer Center Only)    Standing Status:   Future    Expected Date:   05/10/2024    Expiration Date:   05/10/2025   CMP (Cancer Center only)    Standing Status:   Future    Expected Date:   05/10/2024    Expiration Date:   05/10/2025   CBC with Differential (Cancer Center Only)    Standing Status:   Future    Expected Date:   05/31/2024    Expiration Date:   05/31/2025   CMP (Cancer Center only)    Standing Status:   Future     Expected Date:   05/31/2024    Expiration Date:   05/31/2025   All questions were answered. The patient knows to call the clinic with any problems, questions or concerns. No barriers to learning was detected. The total time spent in the appointment was 40 minutes, including review of chart and various tests results, discussions about plan of care and coordination of care plan     Onita Mattock, MD 04/19/2024

## 2024-04-20 ENCOUNTER — Encounter: Payer: Self-pay | Admitting: *Deleted

## 2024-04-28 ENCOUNTER — Other Ambulatory Visit: Payer: Self-pay | Admitting: Family Medicine

## 2024-04-28 DIAGNOSIS — Q762 Congenital spondylolisthesis: Secondary | ICD-10-CM

## 2024-04-28 DIAGNOSIS — M47817 Spondylosis without myelopathy or radiculopathy, lumbosacral region: Secondary | ICD-10-CM

## 2024-04-28 DIAGNOSIS — M5416 Radiculopathy, lumbar region: Secondary | ICD-10-CM

## 2024-05-09 ENCOUNTER — Inpatient Hospital Stay (HOSPITAL_BASED_OUTPATIENT_CLINIC_OR_DEPARTMENT_OTHER): Admitting: Hematology

## 2024-05-09 ENCOUNTER — Inpatient Hospital Stay

## 2024-05-09 ENCOUNTER — Encounter: Payer: Self-pay | Admitting: Hematology

## 2024-05-09 VITALS — BP 132/56 | HR 86 | Temp 97.9°F | Resp 17 | Ht 62.0 in | Wt 192.2 lb

## 2024-05-09 DIAGNOSIS — C50212 Malignant neoplasm of upper-inner quadrant of left female breast: Secondary | ICD-10-CM

## 2024-05-09 DIAGNOSIS — K521 Toxic gastroenteritis and colitis: Secondary | ICD-10-CM | POA: Diagnosis not present

## 2024-05-09 DIAGNOSIS — Z170 Estrogen receptor positive status [ER+]: Secondary | ICD-10-CM | POA: Diagnosis not present

## 2024-05-09 DIAGNOSIS — Z1721 Progesterone receptor positive status: Secondary | ICD-10-CM | POA: Diagnosis not present

## 2024-05-09 DIAGNOSIS — Z95828 Presence of other vascular implants and grafts: Secondary | ICD-10-CM

## 2024-05-09 DIAGNOSIS — Z1731 Human epidermal growth factor receptor 2 positive status: Secondary | ICD-10-CM | POA: Diagnosis not present

## 2024-05-09 DIAGNOSIS — Z5112 Encounter for antineoplastic immunotherapy: Secondary | ICD-10-CM | POA: Diagnosis not present

## 2024-05-09 LAB — CMP (CANCER CENTER ONLY)
ALT: 38 U/L (ref 0–44)
AST: 28 U/L (ref 15–41)
Albumin: 3.3 g/dL — ABNORMAL LOW (ref 3.5–5.0)
Alkaline Phosphatase: 58 U/L (ref 38–126)
Anion gap: 6 (ref 5–15)
BUN: 13 mg/dL (ref 8–23)
CO2: 27 mmol/L (ref 22–32)
Calcium: 8.7 mg/dL — ABNORMAL LOW (ref 8.9–10.3)
Chloride: 107 mmol/L (ref 98–111)
Creatinine: 0.94 mg/dL (ref 0.44–1.00)
GFR, Estimated: >60 mL/min (ref >60)
Glucose, Bld: 122 mg/dL — ABNORMAL HIGH (ref 70–99)
Potassium: 4.2 mmol/L (ref 3.5–5.1)
Sodium: 140 mmol/L (ref 135–145)
Total Bilirubin: 0.2 mg/dL (ref 0.0–1.2)
Total Protein: 6.1 g/dL — ABNORMAL LOW (ref 6.5–8.1)

## 2024-05-09 LAB — CBC WITH DIFFERENTIAL (CANCER CENTER ONLY)
Abs Immature Granulocytes: 0.02 K/uL (ref 0.00–0.07)
Basophils Absolute: 0.1 K/uL (ref 0.0–0.1)
Basophils Relative: 1 %
Eosinophils Absolute: 1.4 K/uL — ABNORMAL HIGH (ref 0.0–0.5)
Eosinophils Relative: 20 %
HCT: 26.6 % — ABNORMAL LOW (ref 36.0–46.0)
Hemoglobin: 8.6 g/dL — ABNORMAL LOW (ref 12.0–15.0)
Immature Granulocytes: 0 %
Lymphocytes Relative: 39 %
Lymphs Abs: 2.5 K/uL (ref 0.7–4.0)
MCH: 26.3 pg (ref 26.0–34.0)
MCHC: 32.3 g/dL (ref 30.0–36.0)
MCV: 81.3 fL (ref 80.0–100.0)
Monocytes Absolute: 0.6 K/uL (ref 0.1–1.0)
Monocytes Relative: 8 %
Neutro Abs: 2.2 K/uL (ref 1.7–7.7)
Neutrophils Relative %: 32 %
Platelet Count: 472 K/uL — ABNORMAL HIGH (ref 150–400)
RBC: 3.27 MIL/uL — ABNORMAL LOW (ref 3.87–5.11)
RDW: 14.6 % (ref 11.5–15.5)
WBC Count: 6.8 K/uL (ref 4.0–10.5)
nRBC: 0 % (ref 0.0–0.2)

## 2024-05-09 MED ORDER — FAM-TRASTUZUMAB DERUXTECAN-NXKI CHEMO 100 MG IV SOLR
5.4000 mg/kg | Freq: Once | INTRAVENOUS | Status: AC
  Administered 2024-05-09: 500 mg via INTRAVENOUS
  Filled 2024-05-09: qty 25

## 2024-05-09 MED ORDER — PALONOSETRON HCL INJECTION 0.25 MG/5ML
0.2500 mg | Freq: Once | INTRAVENOUS | Status: AC
  Administered 2024-05-09: 0.25 mg via INTRAVENOUS
  Filled 2024-05-09: qty 5

## 2024-05-09 MED ORDER — APREPITANT 130 MG/18ML IV EMUL
130.0000 mg | Freq: Once | INTRAVENOUS | Status: AC
  Administered 2024-05-09: 130 mg via INTRAVENOUS
  Filled 2024-05-09: qty 18

## 2024-05-09 MED ORDER — DEXAMETHASONE SODIUM PHOSPHATE 10 MG/ML IJ SOLN
10.0000 mg | Freq: Once | INTRAMUSCULAR | Status: AC
  Administered 2024-05-09: 10 mg via INTRAVENOUS
  Filled 2024-05-09: qty 1

## 2024-05-09 MED ORDER — DIPHENHYDRAMINE HCL 25 MG PO CAPS
50.0000 mg | ORAL_CAPSULE | Freq: Once | ORAL | Status: AC
  Administered 2024-05-09: 50 mg via ORAL
  Filled 2024-05-09: qty 2

## 2024-05-09 MED ORDER — SODIUM CHLORIDE 0.9% FLUSH
10.0000 mL | Freq: Once | INTRAVENOUS | Status: AC
  Administered 2024-05-09: 10 mL

## 2024-05-09 MED ORDER — DIPHENOXYLATE-ATROPINE 2.5-0.025 MG PO TABS: 1.0000 | ORAL_TABLET | Freq: Four times a day (QID) | ORAL | 1 refills | Status: DC | PRN

## 2024-05-09 MED ORDER — DEXTROSE 5 % IV SOLN
INTRAVENOUS | Status: DC
Start: 2024-05-09 — End: 2024-05-09

## 2024-05-09 MED ORDER — ACETAMINOPHEN 325 MG PO TABS
650.0000 mg | ORAL_TABLET | Freq: Once | ORAL | Status: AC
  Administered 2024-05-09: 650 mg via ORAL
  Filled 2024-05-09: qty 2

## 2024-05-09 NOTE — Progress Notes (Signed)
 [facility]   [phone] Fax:([phone]) [phone]   Clinic Follow up Note   Patient Care Team: McDiarmid, Krystal BIRCH, MD as PCP - General [name], MD as Consulting Physician (Ophthalmology) Mavis Purchase, MD as Consulting Physician (Neurosurgery) Jaye Fallow, MD as Consulting Physician (Ophthalmology) Charlsie Josette SAILOR, RN as [facility] Glean Stephane BROCKS, RN (Inactive) as Oncology Nurse Navigator Tyree Nanetta SAILOR, RN as Oncology Nurse Navigator Lanny Callander, MD as Consulting Physician (Hematology)  Date of Service:  05/09/2024  CHIEF COMPLAINT: f/u of PET scan  CURRENT THERAPY:  Enhertu  every 3 weeks  Oncology History   Malignant neoplasm of upper-inner quadrant of left breast in female, estrogen receptor positive (HCC) -multifocal (UIQ, central, and lateral of left breast), UIQ cT3cN1M0, ER+/PR+/HER2+, central and lateral mass are invasive mammary carcinoma, favor lobular, ER/PR strongly positive, HER2 negative by FISH -presented with screening discovered left breast cancer with nodal metastasis.  - Breast MRI showed 2.5 cm mass in the upper inner quadrant of left breast, additional suspicious clumped linear non-mass enhancement in the retroareolar left measuring 5 cm, and additional small area of non-mass enhancement in the lateral left breast spanning 1.1 cm, 1.3 centimeter abnormal lymph node in the left low axilla, no additional abnormal lymph nodes. -She underwent additional biopsy of the retroareolar and lateral breast mass, which both showed invasive mammary carcinoma, favor lobular, both ER/PR strongly positive, HER2 negative by FISH. -PET negative for distant mets  -she started neoadjuvant Enhertu  on 03/29/2024 - Patient is not interested in surgery or intensive chemotherapy, plan to change her treatment to AI and HER2 antibodies as a maintenance therapy at some point.  Assessment & Plan Breast cancer Breast cancer under treatment with  chemotherapy, currently on cycle three. Reports improved tolerance to the regimen. No palpable mass in the breast. Opted out of genetic testing despite family history due to lack of biological children and siblings' disinterest. Discussed potential for oral medication if a genetic mutation is found, but declined testing. Goal of treatment is tumor shrinkage, with some patients achieving complete resolution, though not guaranteed. - Continue current chemotherapy regimen - Order diagnostic mammogram and ultrasound of the left breast in 5-6 weeks - Cancel genetic testing appointment on August 11th - Schedule next chemotherapy infusion for August 12th  Anemia due to chemotherapy Anemia likely secondary to chemotherapy, with hemoglobin decreased to 8.6 from 9.6. Reports increased fatigue, possibly due to anemia. Discussed potential need for blood transfusion if anemia worsens. - Recommend prenatal multivitamin to address anemia - Monitor hemoglobin levels - Consider blood transfusion if hemoglobin continues to decrease  Diarrhea Diarrhea previously managed with Lomotil , which was effective. Reports improvement in symptoms and increased ability to eat. - Refill Lomotil  prescription at [facility] on [address]  Back pain Chronic back pain managed with gabapentin  and hydrocodone .  General Health Maintenance Echocardiogram needed every three months due to chemotherapy regimen. - Order echocardiogram after next cycle of treatment   Plan - Labs reviewed, worsening anemia, no blood transfusion for now, she will start a prenatal multivitamin once daily - Will proceed cycle 3 Enhertu  today and continue every 3 weeks - Follow-up in 3 weeks - Plan to repeat diagnostic mammogram and left breast and axillary ultrasound to evaluate response to treatment after next cycle  SUMMARY OF ONCOLOGIC HISTORY: Oncology History  Malignant neoplasm of upper-inner quadrant of left breast in female, estrogen receptor  positive (HCC)  02/25/2024 Cancer Staging   Staging form: Breast, AJCC 8th Edition -  Clinical stage from 02/25/2024: Stage IB (cT1c, cN1, cM0, G3, ER+, PR+, HER2+) - Signed by Lanny Callander, MD on 03/07/2024 Stage prefix: Initial diagnosis Histologic grading system: 3 grade system   03/07/2024 Initial Diagnosis   Malignant neoplasm of upper-inner quadrant of left breast in female, estrogen receptor positive (HCC)   03/29/2024 -  Chemotherapy   Patient is on Treatment Plan : BREAST Fam-Trastuzumab Deruxtecan-nxki  (Enhertu ) (5.4) q21d        Discussed the use of AI scribe software for clinical note transcription with the patient, who gave verbal consent to proceed.  History of Present Illness [name] is a 73 year old female with breast cancer who presents for follow-up.  She is undergoing her third cycle of chemotherapy, which is better tolerated than the first. Nausea is reduced, and she is able to maintain her weight and eat more. Fatigue is present, and she has experienced alopecia. Diarrhea was initially severe but improved with Lomotil , which she now needs a refill for. Her hemoglobin has decreased from 9.6 to 8.6. She takes prenatal multivitamins.  Family history includes two sisters with breast cancer and a mother with benign breast lumps. She has no biological children and is not interested in genetic testing.     All other systems were reviewed with the patient and are negative.  MEDICAL HISTORY:  Past Medical History:  Diagnosis Date   Acute MEE (middle ear effusion) 06/07/2014   Acute recurrent sinusitis 12/21/2014   Acute sinusitis 12/21/2014   Allergy    Anemia    hx   Asthma, intermittent 11/02/2018   ATTENTION DEFICIT, W/O HYPERACTIVITY 12/16/2006   Bilateral carpal tunnel syndrome 11/20/2016   Cataract    Cervical spondylosis with myelopathy, History of 04/28/2012   COLON POLYP 12/16/2006   (02/03/12) Surgical [Pathology], sigmoid colon, polyp HYPERPLASTIC POLYP(S)  AND POLYPOID FRAGMETNS OF BENIGN COLONIC MUCOSA WITH INTRAMUCOSAL LYMPHOID AGGREGATES. NO ADENOMATOUS CHANGE OR MALIGNANCY IDENTIFIED.    DIABETES MELLITUS II, UNCOMPLICATED 12/16/2006   Fall 10/18/2020   GERD (gastroesophageal reflux disease)    H/O abnormal mammogram 07/19/2009   S/P Breast Biopsy [facility], [place], 07/2009): Benign findings.     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/20/2016   OSTEOARTHRITIS, MULTI SITES 12/16/2006   Perennial allergic rhinitis with seasonal variation 12/16/2006        PONV (postoperative nausea and vomiting)    last surgery only   Recurrent maxillary sinusitis    SACROILIITIS, HISTORY OF 01/08/2009   Qualifier: History of  By: McDiarmid MD, Todd     Seasonal allergies    Seborrheic keratosis 01/15/2016   SKELETAL HYPEROSTOSIS 03/29/2008   Ulcer    Uterine fibroid 02/02/2011   TVUS: 4cm fibroid w/ submucosal component, bil.hydrosalpinges, unable measurable endometrium - 08/18/2004     SURGICAL HISTORY: Past Surgical History:  Procedure Laterality Date   ANTERIOR CERVICAL DECOMP/DISCECTOMY FUSION  04/28/2012  Procedure: ANTERIOR CERVICAL DECOMPRESSION/DISCECTOMY FUSION 3 LEVELS;  Surgeon: Reyes JONETTA Budge, MD;  Location: [facility];  Service: Neurosurgery;  Laterality: N/A;  Cervical three-four,Cervical four-five,Cervical five-six anterior cervical decompression with fusion  interbody prothesis plating and bonegraft   BREAST BIOPSY  07/19/2009   S/P Breast Biopsy [facility], [place], 07/2009): Benign findings.  (10/27/2010)   BREAST BIOPSY Left 02/25/2024   US  LT BREAST BX W LOC DEV 1ST LESION IMG BX SPEC US  GUIDE 02/25/2024 [facility]   BREAST BIOPSY Left 02/25/2024   US  LT BREAST BX W LOC DEV EA ADD LESION IMG BX SPEC US  GUIDE 02/25/2024 [facility]   BREAST BIOPSY Right 03/02/2024   US  RT BREAST BX W LOC DEV 1ST LESION IMG BX SPEC US  GUIDE 03/02/2024 [facility]MO[facility]   BREAST LUMPECTOMY Left    CARPAL TUNNEL RELEASE Left 12/10/2016   Procedure: LEFT CARPAL TUNNEL RELEASE;  Surgeon: Murrell Kuba, MD;  Location: [facility];  Service: Orthopedics;  Laterality: Left;   COLONOSCOPY W/ POLYPECTOMY  12/17/2000   colonoscopy (Dr Kristie) int. hemorrhoids &  nonneoplatic colon polyps - 01/10/2001,    COLONOSCOPY W/ POLYPECTOMY  01/18/2012   Dr Albertus (GI).sigmoid colon, hyperlastic polyp   ENDOMETRIAL BIOPSY  08/19/2004   Endometrial Biopsy - 09/01/2004,   IR IMAGING GUIDED PORT INSERTION  03/28/2024   NM MYOVIEW  LTD  04/18/2004   Cardiolite:EF63%, no ischemia - 05/01/2004,    REPLACEMENT TOTAL KNEE BILATERAL      I have reviewed the social history and family history with the patient and they are unchanged from previous note.  ALLERGIES:  is allergic to januvia  [sitagliptin ], jardiance  [empagliflozin ], amlodipine , celecoxib, and diclofenac-misoprostol.  MEDICATIONS:  Current Outpatient Medications  Medication Sig Dispense Refill   amLODipine -benazepril  (LOTREL) 10-20 MG capsule TAKE 1 CAPSULE BY MOUTH DAILY 90 capsule 3   Apoaequorin (PREVAGEN PO) Take by mouth.     atorvastatin  (LIPITOR) 40 MG tablet Take 1 tablet (40 mg total) by mouth daily. 90 tablet 3   blood glucose meter kit and supplies KIT Dispense based on patient and insurance preference. Use up to four times daily as directed. 1 each 0   Blood Glucose Monitoring Suppl  DEVI 1 each by Does not apply route in the morning, at noon, and at bedtime. May substitute to any manufacturer covered by patient's insurance. 1 each 0   carvedilol  (COREG ) 6.25 MG tablet TAKE 1 TABLET(6.25 MG) BY MOUTH TWICE DAILY WITH A MEAL 180 tablet 3   diphenoxylate -atropine  (LOMOTIL ) 2.5-0.025 MG tablet Take 1-2 tablets by mouth 4 (four) times daily as needed for diarrhea or loose stools. 90 tablet 1   fexofenadine  (ALLEGRA ) 180 MG tablet Take 180 mg by mouth daily.     fluconazole  (DIFLUCAN ) 150 MG tablet TAKE 1 TABLET BY MOUTH, THEN AGAIN IN 3 DAYS 2 tablet 3   gabapentin  (NEURONTIN ) 100 MG capsule TAKE 1 CAPSULE(100 MG) BY MOUTH THREE TIMES DAILY AS NEEDED 270 capsule 3   hydrochlorothiazide  (HYDRODIURIL ) 25 MG tablet TAKE 1 TABLET(25 MG) BY MOUTH DAILY 90 tablet 3   HYDROcodone -acetaminophen  (NORCO) 7.5-325 MG tablet Take 1 tablet by mouth every 8 (eight) hours as needed for moderate pain (pain score 4-6). 90 tablet 0   lidocaine -prilocaine  (EMLA ) cream Apply 1 Application topically daily as needed. Use as directed for port access 30 g 3   metFORMIN  (GLUCOPHAGE -XR) 500 MG 24 hr tablet TAKE 1 TABLET(500 MG) BY MOUTH TWICE DAILY WITH A MEAL 180 tablet 3  methylphenidate  (RITALIN ) 10 MG tablet Take 2 tablets (20 mg total) by mouth 3 (three) times daily with meals. 180 tablet 0   naproxen  (NAPROSYN ) 500 MG tablet TAKE 1 TABLET(500 MG) BY MOUTH TWICE DAILY WITH A MEAL 180 tablet 5   ondansetron  (ZOFRAN ) 8 MG tablet Take 1 tablet (8 mg total) by mouth every 8 (eight) hours as needed for nausea or vomiting. Start on the third day after chemotherapy. 30 tablet 1   OVER THE COUNTER MEDICATION Store brand Ducolax stool softener. One capsule daily.     prochlorperazine  (COMPAZINE ) 10 MG tablet Take 1 tablet (10 mg total) by mouth every 6 (six) hours as needed for nausea or vomiting. 30 tablet 1   vitamin B-12 (CYANOCOBALAMIN) 1000 MCG tablet Take 1 tablet (1,000 mcg total) by mouth daily. 180 tablet  3   No current facility-administered medications for this visit.    PHYSICAL EXAMINATION: ECOG PERFORMANCE STATUS: 1 - Symptomatic but completely ambulatory  Vitals:   05/09/24 1417  BP: (!) 132/56  Pulse: 86  Resp: 17  Temp: 97.9 F (36.6 C)  SpO2: 99%   Wt Readings from Last 3 Encounters:  05/09/24 192 lb 3.2 oz (87.2 kg)  04/19/24 197 lb 6.4 oz (89.5 kg)  04/04/24 193 lb 6.4 oz (87.7 kg)     GENERAL:alert, no distress and comfortable SKIN: skin color, texture, turgor are normal, no rashes or significant lesions EYES: normal, Conjunctiva are pink and non-injected, sclera clear NECK: supple, thyroid  normal size, non-tender, without nodularity LYMPH:  no palpable lymphadenopathy in the cervical, axillary  LUNGS: clear to auscultation and percussion with normal breathing effort HEART: regular rate & rhythm and no murmurs and no lower extremity edema ABDOMEN:abdomen soft, non-tender and normal bowel sounds Musculoskeletal:no cyanosis of digits and no clubbing  NEURO: alert & oriented x 3 with fluent speech, no focal motor/sensory deficits  Physical Exam    LABORATORY DATA:  I have reviewed the data as listed    Latest Ref Rng & Units 05/09/2024    1:48 PM 04/19/2024   12:50 PM 04/04/2024    9:44 AM  CBC  WBC 4.0 - 10.5 K/uL 6.8  10.4  5.7   Hemoglobin 12.0 - 15.0 g/dL 8.6  9.6  88.8   Hematocrit 36.0 - 46.0 % 26.6  29.1  32.5   Platelets 150 - 400 K/uL 472  697  248         Latest Ref Rng & Units 05/09/2024    1:48 PM 04/19/2024   12:50 PM 04/04/2024    9:44 AM  CMP  Glucose 70 - 99 mg/dL 877  886  830   BUN 8 - 23 mg/dL 13  16  23    Creatinine 0.44 - 1.00 mg/dL 9.05  9.18  8.97   Sodium 135 - 145 mmol/L 140  137  131   Potassium 3.5 - 5.1 mmol/L 4.2  4.5  4.1   Chloride 98 - 111 mmol/L 107  104  97   CO2 22 - 32 mmol/L 27  27  25    Calcium  8.9 - 10.3 mg/dL 8.7  8.4  9.5   Total Protein 6.5 - 8.1 g/dL 6.1  5.7  7.2   Total Bilirubin 0.0 - 1.2 mg/dL 0.2  0.1   0.4   Alkaline Phos 38 - 126 U/L 58  52  50   AST 15 - 41 U/L 28  18  14    ALT 0 - 44 U/L 38  33  14       RADIOGRAPHIC STUDIES: I have personally reviewed the radiological images as listed and agreed with the findings in the report. No results found.    Orders Placed This Encounter  Procedures   MM 3D DIAGNOSTIC MAMMOGRAM BILATERAL BREAST    MCR PF; 02/21/24@[facility]  EPIC ORDER    Standing Status:   Future    Expected Date:   06/09/2024    Expiration Date:   05/09/2025    Reason for Exam (SYMPTOM  OR DIAGNOSIS REQUIRED):   ealuate treatment response    Preferred imaging location?:   [facility]   US  LIMITED ULTRASOUND INCLUDING AXILLA LEFT BREAST     Standing Status:   Future    Expected Date:   06/09/2024    Expiration Date:   05/09/2025    Reason for Exam (SYMPTOM  OR DIAGNOSIS REQUIRED):   evaulate trastment response    Preferred Imaging Location?:   [facility]   CBC with Differential (Cancer Center Only)    Standing Status:   Future    Expected Date:   06/21/2024    Expiration Date:   06/21/2025   CMP (Cancer Center only)    Standing Status:   Future    Expected Date:   06/21/2024    Expiration Date:   06/21/2025   CBC with Differential (Cancer Center Only)    Standing Status:   Future    Expected Date:   07/12/2024    Expiration Date:   07/12/2025   CMP (Cancer Center only)    Standing Status:   Future    Expected Date:   07/12/2024    Expiration Date:   07/12/2025   All questions were answered. The patient knows to call the clinic with any problems, questions or concerns. No barriers to learning was detected. The total time spent in the appointment was 30 minutes, including review of chart and various tests results, discussions about plan of care and coordination of care plan     Onita Mattock, MD 05/09/2024

## 2024-05-09 NOTE — Patient Instructions (Signed)

## 2024-05-09 NOTE — Assessment & Plan Note (Signed)
-  multifocal (UIQ, central, and lateral of left breast), UIQ cT3cN1M0, ER+/PR+/HER2+, central and lateral mass are invasive mammary carcinoma, favor lobular, ER/PR strongly positive, HER2 negative by FISH -presented with screening discovered left breast cancer with nodal metastasis.  - Breast MRI showed 2.5 cm mass in the upper inner quadrant of left breast, additional suspicious clumped linear non-mass enhancement in the retroareolar left measuring 5 cm, and additional small area of non-mass enhancement in the lateral left breast spanning 1.1 cm, 1.3 centimeter abnormal lymph node in the left low axilla, no additional abnormal lymph nodes. -She underwent additional biopsy of the retroareolar and lateral breast mass, which both showed invasive mammary carcinoma, favor lobular, both ER/PR strongly positive, HER2 negative by FISH. -PET negative for distant mets  -she started neoadjuvant Enhertu  on 03/29/2024 - Patient is not interested in surgery or intensive chemotherapy, plan to change her treatment to AI and HER2 antibodies as a maintenance therapy at some point.

## 2024-05-16 ENCOUNTER — Telehealth: Payer: Self-pay | Admitting: Hematology

## 2024-05-16 NOTE — Telephone Encounter (Signed)
 Scheduled appointments per WQ. Talked with the patient and she is aware of the made appointments.

## 2024-05-19 ENCOUNTER — Encounter: Payer: Self-pay | Admitting: Hematology

## 2024-05-25 ENCOUNTER — Encounter: Payer: Self-pay | Admitting: Hematology

## 2024-05-29 ENCOUNTER — Inpatient Hospital Stay

## 2024-05-29 ENCOUNTER — Inpatient Hospital Stay: Admitting: Genetic Counselor

## 2024-05-30 ENCOUNTER — Emergency Department (HOSPITAL_COMMUNITY)

## 2024-05-30 ENCOUNTER — Inpatient Hospital Stay: Attending: Nurse Practitioner | Admitting: Hematology

## 2024-05-30 ENCOUNTER — Encounter: Payer: Self-pay | Admitting: *Deleted

## 2024-05-30 ENCOUNTER — Encounter (HOSPITAL_COMMUNITY): Payer: Self-pay | Admitting: Emergency Medicine

## 2024-05-30 ENCOUNTER — Other Ambulatory Visit: Payer: Self-pay

## 2024-05-30 ENCOUNTER — Inpatient Hospital Stay (HOSPITAL_COMMUNITY)
Admission: EM | Admit: 2024-05-30 | Discharge: 2024-06-03 | DRG: 175 | Disposition: A | Discharge status: Home / Self Care | Source: Ambulatory Visit | Attending: Internal Medicine | Admitting: Internal Medicine

## 2024-05-30 ENCOUNTER — Inpatient Hospital Stay

## 2024-05-30 VITALS — BP 123/63 | HR 103 | Temp 98.2°F | Resp 19 | Ht 62.0 in | Wt 188.8 lb

## 2024-05-30 DIAGNOSIS — Z95828 Presence of other vascular implants and grafts: Secondary | ICD-10-CM

## 2024-05-30 DIAGNOSIS — M159 Polyosteoarthritis, unspecified: Secondary | ICD-10-CM | POA: Diagnosis present

## 2024-05-30 DIAGNOSIS — G4734 Idiopathic sleep related nonobstructive alveolar hypoventilation: Principal | ICD-10-CM

## 2024-05-30 DIAGNOSIS — Z8249 Family history of ischemic heart disease and other diseases of the circulatory system: Secondary | ICD-10-CM | POA: Diagnosis not present

## 2024-05-30 DIAGNOSIS — C50212 Malignant neoplasm of upper-inner quadrant of left female breast: Secondary | ICD-10-CM

## 2024-05-30 DIAGNOSIS — L89313 Pressure ulcer of right buttock, stage 3: Secondary | ICD-10-CM | POA: Diagnosis present

## 2024-05-30 DIAGNOSIS — L89323 Pressure ulcer of left buttock, stage 3: Secondary | ICD-10-CM | POA: Diagnosis present

## 2024-05-30 DIAGNOSIS — Z170 Estrogen receptor positive status [ER+]: Secondary | ICD-10-CM

## 2024-05-30 DIAGNOSIS — E876 Hypokalemia: Secondary | ICD-10-CM | POA: Diagnosis present

## 2024-05-30 DIAGNOSIS — R531 Weakness: Secondary | ICD-10-CM | POA: Insufficient documentation

## 2024-05-30 DIAGNOSIS — J9601 Acute respiratory failure with hypoxia: Secondary | ICD-10-CM | POA: Diagnosis not present

## 2024-05-30 DIAGNOSIS — Z1721 Progesterone receptor positive status: Secondary | ICD-10-CM | POA: Insufficient documentation

## 2024-05-30 DIAGNOSIS — Z6835 Body mass index (BMI) 35.0-35.9, adult: Secondary | ICD-10-CM

## 2024-05-30 DIAGNOSIS — I50810 Right heart failure, unspecified: Secondary | ICD-10-CM | POA: Diagnosis present

## 2024-05-30 DIAGNOSIS — Z9221 Personal history of antineoplastic chemotherapy: Secondary | ICD-10-CM

## 2024-05-30 DIAGNOSIS — R059 Cough, unspecified: Secondary | ICD-10-CM | POA: Diagnosis not present

## 2024-05-30 DIAGNOSIS — I2609 Other pulmonary embolism with acute cor pulmonale: Secondary | ICD-10-CM | POA: Diagnosis present

## 2024-05-30 DIAGNOSIS — I82443 Acute embolism and thrombosis of tibial vein, bilateral: Secondary | ICD-10-CM | POA: Diagnosis present

## 2024-05-30 DIAGNOSIS — G8929 Other chronic pain: Secondary | ICD-10-CM | POA: Diagnosis present

## 2024-05-30 DIAGNOSIS — E66812 Obesity, class 2: Secondary | ICD-10-CM | POA: Diagnosis present

## 2024-05-30 DIAGNOSIS — Z79899 Other long term (current) drug therapy: Secondary | ICD-10-CM

## 2024-05-30 DIAGNOSIS — Z87891 Personal history of nicotine dependence: Secondary | ICD-10-CM

## 2024-05-30 DIAGNOSIS — R0602 Shortness of breath: Secondary | ICD-10-CM | POA: Diagnosis not present

## 2024-05-30 DIAGNOSIS — I700 Atherosclerosis of aorta: Secondary | ICD-10-CM | POA: Diagnosis present

## 2024-05-30 DIAGNOSIS — E1140 Type 2 diabetes mellitus with diabetic neuropathy, unspecified: Secondary | ICD-10-CM | POA: Diagnosis not present

## 2024-05-30 DIAGNOSIS — R079 Chest pain, unspecified: Secondary | ICD-10-CM | POA: Diagnosis not present

## 2024-05-30 DIAGNOSIS — M8580 Other specified disorders of bone density and structure, unspecified site: Secondary | ICD-10-CM | POA: Diagnosis present

## 2024-05-30 DIAGNOSIS — I2489 Other forms of acute ischemic heart disease: Secondary | ICD-10-CM

## 2024-05-30 DIAGNOSIS — Z1732 Human epidermal growth factor receptor 2 negative status: Secondary | ICD-10-CM | POA: Insufficient documentation

## 2024-05-30 DIAGNOSIS — Z7901 Long term (current) use of anticoagulants: Secondary | ICD-10-CM | POA: Diagnosis not present

## 2024-05-30 DIAGNOSIS — D649 Anemia, unspecified: Secondary | ICD-10-CM | POA: Diagnosis not present

## 2024-05-30 DIAGNOSIS — C50919 Malignant neoplasm of unspecified site of unspecified female breast: Secondary | ICD-10-CM | POA: Diagnosis not present

## 2024-05-30 DIAGNOSIS — I2694 Multiple subsegmental pulmonary emboli without acute cor pulmonale: Principal | ICD-10-CM | POA: Diagnosis present

## 2024-05-30 DIAGNOSIS — I517 Cardiomegaly: Secondary | ICD-10-CM | POA: Diagnosis not present

## 2024-05-30 DIAGNOSIS — Z833 Family history of diabetes mellitus: Secondary | ICD-10-CM

## 2024-05-30 DIAGNOSIS — Z8711 Personal history of peptic ulcer disease: Secondary | ICD-10-CM

## 2024-05-30 DIAGNOSIS — D509 Iron deficiency anemia, unspecified: Secondary | ICD-10-CM | POA: Diagnosis present

## 2024-05-30 DIAGNOSIS — I110 Hypertensive heart disease with heart failure: Secondary | ICD-10-CM | POA: Diagnosis present

## 2024-05-30 DIAGNOSIS — Z860100 Personal history of colon polyps, unspecified: Secondary | ICD-10-CM

## 2024-05-30 DIAGNOSIS — J4520 Mild intermittent asthma, uncomplicated: Secondary | ICD-10-CM | POA: Diagnosis not present

## 2024-05-30 DIAGNOSIS — R5383 Other fatigue: Secondary | ICD-10-CM | POA: Insufficient documentation

## 2024-05-30 DIAGNOSIS — D0592 Unspecified type of carcinoma in situ of left breast: Secondary | ICD-10-CM | POA: Diagnosis present

## 2024-05-30 DIAGNOSIS — D72829 Elevated white blood cell count, unspecified: Secondary | ICD-10-CM | POA: Diagnosis present

## 2024-05-30 DIAGNOSIS — M7989 Other specified soft tissue disorders: Secondary | ICD-10-CM | POA: Diagnosis not present

## 2024-05-30 DIAGNOSIS — Z888 Allergy status to other drugs, medicaments and biological substances status: Secondary | ICD-10-CM

## 2024-05-30 DIAGNOSIS — E7800 Pure hypercholesterolemia, unspecified: Secondary | ICD-10-CM | POA: Diagnosis not present

## 2024-05-30 DIAGNOSIS — Z8261 Family history of arthritis: Secondary | ICD-10-CM

## 2024-05-30 DIAGNOSIS — Z96653 Presence of artificial knee joint, bilateral: Secondary | ICD-10-CM | POA: Diagnosis present

## 2024-05-30 DIAGNOSIS — K521 Toxic gastroenteritis and colitis: Secondary | ICD-10-CM | POA: Insufficient documentation

## 2024-05-30 DIAGNOSIS — R000 Tachycardia, unspecified: Secondary | ICD-10-CM | POA: Diagnosis not present

## 2024-05-30 DIAGNOSIS — R630 Anorexia: Secondary | ICD-10-CM | POA: Insufficient documentation

## 2024-05-30 DIAGNOSIS — I2699 Other pulmonary embolism without acute cor pulmonale: Secondary | ICD-10-CM | POA: Diagnosis not present

## 2024-05-30 DIAGNOSIS — T451X5A Adverse effect of antineoplastic and immunosuppressive drugs, initial encounter: Secondary | ICD-10-CM | POA: Insufficient documentation

## 2024-05-30 DIAGNOSIS — R634 Abnormal weight loss: Secondary | ICD-10-CM | POA: Insufficient documentation

## 2024-05-30 DIAGNOSIS — Z7984 Long term (current) use of oral hypoglycemic drugs: Secondary | ICD-10-CM

## 2024-05-30 DIAGNOSIS — Z886 Allergy status to analgesic agent status: Secondary | ICD-10-CM

## 2024-05-30 HISTORY — DX: Other forms of acute ischemic heart disease: I24.89

## 2024-05-30 LAB — CMP (CANCER CENTER ONLY)
ALT: 21 U/L (ref 0–44)
AST: 19 U/L (ref 15–41)
Albumin: 3.2 g/dL — ABNORMAL LOW (ref 3.5–5.0)
Alkaline Phosphatase: 64 U/L (ref 38–126)
Anion gap: 11 (ref 5–15)
BUN: 13 mg/dL (ref 8–23)
CO2: 22 mmol/L (ref 22–32)
Calcium: 8.2 mg/dL — ABNORMAL LOW (ref 8.9–10.3)
Chloride: 107 mmol/L (ref 98–111)
Creatinine: 1.1 mg/dL — ABNORMAL HIGH (ref 0.44–1.00)
GFR, Estimated: 53 mL/min — ABNORMAL LOW (ref >60)
Glucose, Bld: 191 mg/dL — ABNORMAL HIGH (ref 70–99)
Potassium: 3.8 mmol/L (ref 3.5–5.1)
Sodium: 140 mmol/L (ref 135–145)
Total Bilirubin: 0.2 mg/dL (ref 0.0–1.2)
Total Protein: 6 g/dL — ABNORMAL LOW (ref 6.5–8.1)

## 2024-05-30 LAB — COMPREHENSIVE METABOLIC PANEL WITH GFR
ALT: 23 U/L (ref 0–44)
AST: 23 U/L (ref 15–41)
Albumin: 2.4 g/dL — ABNORMAL LOW (ref 3.5–5.0)
Alkaline Phosphatase: 55 U/L (ref 38–126)
Anion gap: 10 (ref 5–15)
BUN: 13 mg/dL (ref 8–23)
CO2: 20 mmol/L — ABNORMAL LOW (ref 22–32)
Calcium: 8.2 mg/dL — ABNORMAL LOW (ref 8.9–10.3)
Chloride: 108 mmol/L (ref 98–111)
Creatinine, Ser: 1.02 mg/dL — ABNORMAL HIGH (ref 0.44–1.00)
GFR, Estimated: 58 mL/min — ABNORMAL LOW (ref >60)
Glucose, Bld: 147 mg/dL — ABNORMAL HIGH (ref 70–99)
Potassium: 3.5 mmol/L (ref 3.5–5.1)
Sodium: 138 mmol/L (ref 135–145)
Total Bilirubin: 0.3 mg/dL (ref 0.0–1.2)
Total Protein: 5.7 g/dL — ABNORMAL LOW (ref 6.5–8.1)

## 2024-05-30 LAB — CBC WITH DIFFERENTIAL (CANCER CENTER ONLY)
Abs Immature Granulocytes: 0.09 K/uL — ABNORMAL HIGH (ref 0.00–0.07)
Basophils Absolute: 0.1 K/uL (ref 0.0–0.1)
Basophils Relative: 1 %
Eosinophils Absolute: 0.5 K/uL (ref 0.0–0.5)
Eosinophils Relative: 6 %
HCT: 26.7 % — ABNORMAL LOW (ref 36.0–46.0)
Hemoglobin: 8.9 g/dL — ABNORMAL LOW (ref 12.0–15.0)
Immature Granulocytes: 1 %
Lymphocytes Relative: 32 %
Lymphs Abs: 2.8 K/uL (ref 0.7–4.0)
MCH: 27.1 pg (ref 26.0–34.0)
MCHC: 33.3 g/dL (ref 30.0–36.0)
MCV: 81.2 fL (ref 80.0–100.0)
Monocytes Absolute: 1.1 K/uL — ABNORMAL HIGH (ref 0.1–1.0)
Monocytes Relative: 12 %
Neutro Abs: 4.1 K/uL (ref 1.7–7.7)
Neutrophils Relative %: 48 %
Platelet Count: 500 K/uL — ABNORMAL HIGH (ref 150–400)
RBC: 3.29 MIL/uL — ABNORMAL LOW (ref 3.87–5.11)
RDW: 15.9 % — ABNORMAL HIGH (ref 11.5–15.5)
WBC Count: 8.6 K/uL (ref 4.0–10.5)
nRBC: 0.5 % — ABNORMAL HIGH (ref 0.0–0.2)

## 2024-05-30 LAB — CBC WITH DIFFERENTIAL/PLATELET
Abs Immature Granulocytes: 0.11 K/uL — ABNORMAL HIGH (ref 0.00–0.07)
Basophils Absolute: 0.1 K/uL (ref 0.0–0.1)
Basophils Relative: 1 %
Eosinophils Absolute: 0.5 K/uL (ref 0.0–0.5)
Eosinophils Relative: 6 %
HCT: 26.6 % — ABNORMAL LOW (ref 36.0–46.0)
Hemoglobin: 8.4 g/dL — ABNORMAL LOW (ref 12.0–15.0)
Immature Granulocytes: 1 %
Lymphocytes Relative: 29 %
Lymphs Abs: 2.6 K/uL (ref 0.7–4.0)
MCH: 26.8 pg (ref 26.0–34.0)
MCHC: 31.6 g/dL (ref 30.0–36.0)
MCV: 84.7 fL (ref 80.0–100.0)
Monocytes Absolute: 1.2 K/uL — ABNORMAL HIGH (ref 0.1–1.0)
Monocytes Relative: 13 %
Neutro Abs: 4.6 K/uL (ref 1.7–7.7)
Neutrophils Relative %: 50 %
Platelets: 465 K/uL — ABNORMAL HIGH (ref 150–400)
RBC: 3.14 MIL/uL — ABNORMAL LOW (ref 3.87–5.11)
RDW: 15.9 % — ABNORMAL HIGH (ref 11.5–15.5)
WBC: 9.1 K/uL (ref 4.0–10.5)
nRBC: 0.4 % — ABNORMAL HIGH (ref 0.0–0.2)

## 2024-05-30 LAB — TROPONIN I (HIGH SENSITIVITY)
Troponin I (High Sensitivity): 103 ng/L (ref <18)
Troponin I (High Sensitivity): 111 ng/L (ref <18)

## 2024-05-30 LAB — RESP PANEL BY RT-PCR (RSV, FLU A&B, COVID)  RVPGX2
Influenza A by PCR: NEGATIVE
Influenza B by PCR: NEGATIVE
Resp Syncytial Virus by PCR: NEGATIVE
SARS Coronavirus 2 by RT PCR: NEGATIVE

## 2024-05-30 LAB — BRAIN NATRIURETIC PEPTIDE: B Natriuretic Peptide: 369.8 pg/mL — ABNORMAL HIGH (ref 0.0–100.0)

## 2024-05-30 MED ORDER — CARVEDILOL 6.25 MG PO TABS
6.2500 mg | ORAL_TABLET | Freq: Two times a day (BID) | ORAL | Status: DC
  Administered 2024-05-31 – 2024-06-03 (×9): 6.25 mg via ORAL
  Filled 2024-05-30 (×7): qty 1

## 2024-05-30 MED ORDER — DIPHENOXYLATE-ATROPINE 2.5-0.025 MG PO TABS
1.0000 | ORAL_TABLET | Freq: Four times a day (QID) | ORAL | Status: DC | PRN
  Administered 2024-05-30 (×2): 2 via ORAL
  Administered 2024-06-01: 1 via ORAL
  Administered 2024-06-01: 2 via ORAL
  Filled 2024-05-30: qty 2
  Filled 2024-05-30: qty 1
  Filled 2024-05-30: qty 2

## 2024-05-30 MED ORDER — GABAPENTIN 100 MG PO CAPS
100.0000 mg | ORAL_CAPSULE | Freq: Three times a day (TID) | ORAL | Status: DC | PRN
  Administered 2024-06-01 – 2024-06-02 (×3): 100 mg via ORAL
  Filled 2024-05-30 (×3): qty 1

## 2024-05-30 MED ORDER — BISACODYL 5 MG PO TBEC: 5.0000 mg | DELAYED_RELEASE_TABLET | Freq: Every day | ORAL | Status: DC | PRN

## 2024-05-30 MED ORDER — ACETAMINOPHEN 650 MG RE SUPP: 650.0000 mg | Freq: Four times a day (QID) | RECTAL | Status: DC | PRN

## 2024-05-30 MED ORDER — ONDANSETRON HCL 4 MG PO TABS: 4.0000 mg | ORAL_TABLET | Freq: Four times a day (QID) | ORAL | Status: DC | PRN

## 2024-05-30 MED ORDER — HEPARIN BOLUS VIA INFUSION
2000.0000 [IU] | Freq: Once | INTRAVENOUS | Status: AC
  Administered 2024-05-30 (×2): 2000 [IU] via INTRAVENOUS
  Filled 2024-05-30: qty 2000

## 2024-05-30 MED ORDER — PRENATAL MULTIVITAMIN CH
1.0000 | ORAL_TABLET | Freq: Every day | ORAL | Status: DC
  Administered 2024-05-31 (×2): 1 via ORAL
  Filled 2024-05-30 (×4): qty 1

## 2024-05-30 MED ORDER — BENAZEPRIL HCL 20 MG PO TABS
20.0000 mg | ORAL_TABLET | Freq: Every day | ORAL | Status: DC
  Filled 2024-05-30: qty 1

## 2024-05-30 MED ORDER — HEPARIN (PORCINE) 25000 UT/250ML-% IV SOLN
1200.0000 [IU]/h | INTRAVENOUS | Status: DC
  Administered 2024-05-30 (×2): 1100 [IU]/h via INTRAVENOUS
  Administered 2024-05-31 (×2): 1200 [IU]/h via INTRAVENOUS
  Filled 2024-05-30 (×2): qty 250

## 2024-05-30 MED ORDER — AMLODIPINE BESYLATE 10 MG PO TABS
10.0000 mg | ORAL_TABLET | Freq: Every day | ORAL | Status: DC
  Administered 2024-05-31 – 2024-06-03 (×5): 10 mg via ORAL
  Filled 2024-05-30 (×4): qty 1

## 2024-05-30 MED ORDER — IOHEXOL 350 MG/ML SOLN
75.0000 mL | Freq: Once | INTRAVENOUS | Status: AC | PRN
  Administered 2024-05-30 (×2): 75 mL via INTRAVENOUS

## 2024-05-30 MED ORDER — ACETAMINOPHEN 325 MG PO TABS: 650.0000 mg | ORAL_TABLET | Freq: Four times a day (QID) | ORAL | Status: DC | PRN

## 2024-05-30 MED ORDER — SODIUM CHLORIDE 0.9% FLUSH
10.0000 mL | Freq: Once | INTRAVENOUS | Status: AC
Start: 2024-05-30 — End: 2024-05-30
  Administered 2024-05-30 (×2): 10 mL

## 2024-05-30 MED ORDER — SENNOSIDES-DOCUSATE SODIUM 8.6-50 MG PO TABS: 1.0000 | ORAL_TABLET | Freq: Every evening | ORAL | Status: DC | PRN

## 2024-05-30 MED ORDER — HYDROCHLOROTHIAZIDE 25 MG PO TABS: 25.0000 mg | ORAL_TABLET | Freq: Every day | ORAL | Status: DC

## 2024-05-30 MED ORDER — HYDROCODONE-ACETAMINOPHEN 7.5-325 MG PO TABS
1.0000 | ORAL_TABLET | Freq: Three times a day (TID) | ORAL | Status: DC | PRN
  Administered 2024-06-01 – 2024-06-02 (×4): 1 via ORAL
  Filled 2024-05-30 (×4): qty 1

## 2024-05-30 MED ORDER — ONE-A-DAY WOMENS PRENATAL 1 28-0.8-235 MG PO CAPS: 1.0000 | ORAL_CAPSULE | Freq: Every day | ORAL | Status: DC

## 2024-05-30 MED ORDER — ONDANSETRON HCL 4 MG/2ML IJ SOLN: 4.0000 mg | Freq: Four times a day (QID) | INTRAMUSCULAR | Status: DC | PRN

## 2024-05-30 NOTE — ED Notes (Signed)
 Pt provided a sandwich and sprite.

## 2024-05-30 NOTE — Progress Notes (Signed)
 PHARMACY - ANTICOAGULATION CONSULT NOTE  Pharmacy Consult for heparin  Indication: pulmonary embolus  Allergies  Allergen Reactions   Januvia  [Sitagliptin ] Other (See Comments)    Epigastric abdominal pain   Jardiance  [Empagliflozin ] Other (See Comments)    Lightheadedness, near-syncope, extreme yeast problems   Amlodipine  Other (See Comments)    Leg edema   Celecoxib Palpitations    REACTION: palpitations   Diclofenac-Misoprostol Palpitations    REACTION: palpitation    Patient Measurements: Height: 5' 2 (157.5 cm) Weight: 85.3 kg (188 lb) IBW/kg (Calculated) : 50.1 HEPARIN  DW (KG): 69.4  Vital Signs: Temp: 98.8 F (37.1 C) (08/12 1915) Temp Source: Oral (08/12 1915) BP: 150/71 (08/12 1915) Pulse Rate: 93 (08/12 1915)  Labs: Recent Labs    05/30/24 1330 05/30/24 1528 05/30/24 1742  HGB 8.9* 8.4*  --   HCT 26.7* 26.6*  --   PLT 500* 465*  --   CREATININE 1.10* 1.02*  --   TROPONINIHS  --  103* 111*    Estimated Creatinine Clearance: 49 mL/min (A) (by C-G formula based on SCr of 1.02 mg/dL (H)).   Medical History: Past Medical History:  Diagnosis Date   Acute MEE (middle ear effusion) 06/07/2014   Acute recurrent sinusitis 12/21/2014   Acute sinusitis 12/21/2014   Allergy    Anemia    hx   Asthma, intermittent 11/02/2018   ATTENTION DEFICIT, W/O HYPERACTIVITY 12/16/2006   Bilateral carpal tunnel syndrome 11/20/2016   Cataract    Cervical spondylosis with myelopathy, History of 04/28/2012   COLON POLYP 12/16/2006   (02/03/12) Surgical [Pathology], sigmoid colon, polyp HYPERPLASTIC POLYP(S) AND POLYPOID FRAGMETNS OF BENIGN COLONIC MUCOSA WITH INTRAMUCOSAL LYMPHOID AGGREGATES. NO ADENOMATOUS CHANGE OR MALIGNANCY IDENTIFIED.    DIABETES MELLITUS II, UNCOMPLICATED 12/16/2006   Fall 10/18/2020   GERD (gastroesophageal reflux disease)    H/O abnormal mammogram 07/19/2009   S/P Breast Biopsy [facility], [place], 07/2009): Benign findings.     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/20/2016   OSTEOARTHRITIS, MULTI SITES 12/16/2006   Perennial allergic rhinitis with seasonal variation 12/16/2006        PONV (postoperative nausea and vomiting)    last surgery only   Recurrent maxillary sinusitis    SACROILIITIS, HISTORY OF 01/08/2009   Qualifier: History of  By: McDiarmid MD, Todd     Seasonal allergies    Seborrheic keratosis 01/15/2016   SKELETAL HYPEROSTOSIS 03/29/2008   Ulcer    Uterine fibroid 02/02/2011   TVUS: 4cm fibroid w/ submucosal component, bil.hydrosalpinges, unable measurable endometrium - 08/18/2004     Medications:  No anticoagulants PTA  Assessment: 73 yo F with acute PE with right heart strain. Pharmacy consulted to manage heparin .  SCr 1.02 Hg 8.4, PLT 465  Goal of Therapy:  Heparin  level 0.3-0.7 units/ml Monitor platelets by anticoagulation protocol: Yes   Plan:  Heparin  2000 unit bolus IV x 1  Heparin  drip @ 1100 units/hr Heparin  level 8 hours after bolus Daily CBC & heparin  level F/u for transition to DOAC   [name], Pharm.D Use secure chat for questions 05/30/2024 7:33 PM

## 2024-05-30 NOTE — H&P (Signed)
 History and Physical  [name] FMW:[id number] DOB: July 02, 1951 DOA: 05/30/2024  PCP: McDiarmid, Krystal BIRCH, MD   Chief Complaint: Shortness of breath.  HPI: [name] is a 73 y.o. female with medical history significant for recently diagnosed left breast cancer on neoadjuvant Enhertu , T2DM, HTN, HLD, neuropathy, ADHD, GERD, and osteoarthritis who presented to the ED for evaluation of shortness of breath.  Patient reports that on Saturday, she had a sudden onset of shortness of breath that progressed throughout the weekend. She endorsed dyspnea on exertion, reports feeling significant out of breath after walking to the bathroom.  She also reports feeling slightly lightheaded after walking a few distance and has noticed some swelling in her lower extremities but denies any leg pain, chest pain, palpitations, syncope, abdominal pain, nausea or vomiting.  Patient had a follow-up with her oncologist and due to her elevated heart rate and shortness of breath, she was sent directly to the ED for further evaluation.  ED Course: Initial vitals show temp 98.2, RR 16-21, HR 90s-100s, SBP 120s-150s, SpO2 95% on room air.. Initial labs significant for glucose 147, creatinine 1.02, albumin 2.4, WBC 9.1, Hgb 8.4, platelet 465, BNP 370, troponin 103-111, negative flu, RSV and COVID test. EKG shows normal sinus rhythm with S1Q3T3 pattern. CXR shows no active disease.  CTA chest PE study shows acute segmental and subsegmental pulmonary embolism with right heart strain (RV/LV = 1.4). Patient was started on heparin  drip. TR[facility] was consulted for admission.   Review of Systems: Please see HPI for pertinent positives and negatives. A complete 10 system review of systems are otherwise negative.  Past Medical History:  Diagnosis Date   Acute MEE (middle ear effusion) 06/07/2014   Acute recurrent sinusitis 12/21/2014   Acute sinusitis 12/21/2014   Allergy    Anemia    hx   Asthma, intermittent 11/02/2018   ATTENTION  DEFICIT, W/O HYPERACTIVITY 12/16/2006   Bilateral carpal tunnel syndrome 11/20/2016   Cataract    Cervical spondylosis with myelopathy, History of 04/28/2012   COLON POLYP 12/16/2006   (02/03/12) Surgical [Pathology], sigmoid colon, polyp HYPERPLASTIC POLYP(S) AND POLYPOID FRAGMETNS OF BENIGN COLONIC MUCOSA WITH INTRAMUCOSAL LYMPHOID AGGREGATES. NO ADENOMATOUS CHANGE OR MALIGNANCY IDENTIFIED.    DIABETES MELLITUS II, UNCOMPLICATED 12/16/2006   Fall 10/18/2020   GERD (gastroesophageal reflux disease)    H/O abnormal mammogram 07/19/2009   S/P Breast Biopsy [facility], [place], 07/2009): Benign findings.     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/20/2016   OSTEOARTHRITIS, MULTI SITES 12/16/2006   Perennial allergic rhinitis with seasonal variation 12/16/2006        PONV (postoperative nausea and vomiting)    last surgery only   Recurrent maxillary sinusitis    SACROILIITIS, HISTORY OF 01/08/2009   Qualifier: History of  By: McDiarmid MD, Todd     Seasonal allergies    Seborrheic keratosis 01/15/2016  SKELETAL HYPEROSTOSIS 03/29/2008   Ulcer    Uterine fibroid 02/02/2011   TVUS: 4cm fibroid w/ submucosal component, bil.hydrosalpinges, unable measurable endometrium - 08/18/2004    Past Surgical History:  Procedure Laterality Date   ANTERIOR CERVICAL DECOMP/DISCECTOMY FUSION  04/28/2012   Procedure: ANTERIOR CERVICAL  DECOMPRESSION/DISCECTOMY FUSION 3 LEVELS;  Surgeon: Reyes JONETTA Budge, MD;  Location: [facility];  Service: Neurosurgery;  Laterality: N/A;  Cervical three-four,Cervical four-five,Cervical five-six anterior cervical decompression with fusion interbody prothesis plating and bonegraft   BREAST BIOPSY  07/19/2009   S/P Breast Biopsy [facility], [place], 07/2009): Benign findings.  (10/27/2010)   BREAST BIOPSY Left 02/25/2024   US  LT BREAST BX W LOC DEV 1ST LESION IMG BX SPEC US  GUIDE 02/25/2024 [facility]   BREAST BIOPSY Left 02/25/2024   US  LT BREAST BX W LOC DEV EA ADD LESION IMG BX SPEC US  GUIDE 02/25/2024 [facility]   BREAST BIOPSY Right 03/02/2024   US  RT BREAST BX W LOC DEV 1ST LESION IMG BX SPEC US  GUIDE 03/02/2024 [facility]   BREAST LUMPECTOMY Left    CARPAL TUNNEL RELEASE Left 12/10/2016   Procedure: LEFT CARPAL TUNNEL RELEASE;  Surgeon: Murrell Kuba, MD;  Location: [facility];  Service: Orthopedics;  Laterality: Left;   COLONOSCOPY W/ POLYPECTOMY  12/17/2000   colonoscopy (Dr Kristie) int. hemorrhoids &  nonneoplatic colon polyps - 01/10/2001,    COLONOSCOPY W/ POLYPECTOMY  01/18/2012   Dr Albertus (GI).sigmoid colon, hyperlastic polyp   ENDOMETRIAL BIOPSY  08/19/2004   Endometrial Biopsy - 09/01/2004,   IR IMAGING GUIDED PORT INSERTION  03/28/2024   NM MYOVIEW  LTD  04/18/2004   Cardiolite:EF63%, no ischemia - 05/01/2004,    REPLACEMENT TOTAL KNEE BILATERAL     Social History:  reports that she quit smoking about 44 years ago. Her smoking use included cigarettes. She started smoking about 48 years ago. She has a 4 pack-year smoking history. She has never used smokeless tobacco. She reports that she does not drink alcohol and does not use drugs.  Allergies  Allergen Reactions   Januvia  [Sitagliptin ] Other (See Comments)    Epigastric abdominal pain   Jardiance  [Empagliflozin ] Other (See Comments)    Lightheadedness, near-syncope, extreme yeast  problems   Amlodipine  Other (See Comments)    Leg edema   Celecoxib Palpitations    REACTION: palpitations   Diclofenac-Misoprostol Palpitations    REACTION: palpitation    Family History  Problem Relation Age of Onset   Hypertension Mother    Arthritis Mother    Cancer Sister 5       Breast   Cancer Sister 22       Breast   Arthritis Father    Diabetes Father    Heart attack Sister    Drug abuse Sister    Colon cancer Neg Hx    Esophageal cancer Neg Hx    Rectal cancer Neg Hx    Stomach cancer Neg Hx      Prior to Admission medications   Medication Sig Start Date End Date Taking? Authorizing Provider  amLODipine -benazepril  (LOTREL) 10-20 MG capsule TAKE 1 CAPSULE BY MOUTH DAILY 01/28/24   McDiarmid, Krystal JONETTA, MD  Apoaequorin (PREVAGEN PO) Take by mouth.    [provider]  atorvastatin  (LIPITOR) 40 MG tablet Take 1 tablet (40 mg total) by mouth daily. 05/11/19   McDiarmid, Krystal JONETTA, MD  blood glucose meter kit and supplies KIT Dispense based on patient and insurance preference. Use  up to four times daily as directed. 12/01/22   Christia Budds, MD  Blood Glucose Monitoring Suppl DEVI 1 each by Does not apply route in the morning, at noon, and at bedtime. May substitute to any manufacturer covered by patient's insurance. 12/01/22   Christia Budds, MD  carvedilol  (COREG ) 6.25 MG tablet TAKE 1 TABLET(6.25 MG) BY MOUTH TWICE DAILY WITH A MEAL 11/18/23   McDiarmid, Krystal BIRCH, MD  diphenoxylate -atropine  (LOMOTIL ) 2.5-0.025 MG tablet Take 1-2 tablets by mouth 4 (four) times daily as needed for diarrhea or loose stools. 05/09/24   Lanny Callander, MD  fexofenadine  (ALLEGRA ) 180 MG tablet Take 180 mg by mouth daily.    [provider]  fluconazole  (DIFLUCAN ) 150 MG tablet TAKE 1 TABLET BY MOUTH, THEN AGAIN IN 3 DAYS 11/23/23   McDiarmid, Krystal BIRCH, MD  gabapentin  (NEURONTIN ) 100 MG capsule TAKE 1 CAPSULE(100 MG) BY MOUTH THREE TIMES DAILY AS NEEDED 04/28/24   McDiarmid, Krystal BIRCH, MD   hydrochlorothiazide  (HYDRODIURIL ) 25 MG tablet TAKE 1 TABLET(25 MG) BY MOUTH DAILY 02/07/24   McDiarmid, Krystal BIRCH, MD  HYDROcodone -acetaminophen  (NORCO) 7.5-325 MG tablet Take 1 tablet by mouth every 8 (eight) hours as needed for moderate pain (pain score 4-6). 04/05/24 05/05/24  McDiarmid, Krystal BIRCH, MD  lidocaine -prilocaine  (EMLA ) cream Apply 1 Application topically daily as needed. Use as directed for port access 04/04/24   Hanford Powell BRAVO, NP  metFORMIN  (GLUCOPHAGE -XR) 500 MG 24 hr tablet TAKE 1 TABLET(500 MG) BY MOUTH TWICE DAILY WITH A MEAL 08/09/23   McDiarmid, Krystal BIRCH, MD  methylphenidate  (RITALIN ) 10 MG tablet Take 2 tablets (20 mg total) by mouth 3 (three) times daily with meals. 04/05/24 05/05/24  McDiarmid, Krystal BIRCH, MD  naproxen  (NAPROSYN ) 500 MG tablet TAKE 1 TABLET(500 MG) BY MOUTH TWICE DAILY WITH A MEAL 04/28/24   McDiarmid, Krystal BIRCH, MD  ondansetron  (ZOFRAN ) 8 MG tablet Take 1 tablet (8 mg total) by mouth every 8 (eight) hours as needed for nausea or vomiting. Start on the third day after chemotherapy. 03/10/24   Lanny Callander, MD  OVER THE COUNTER MEDICATION Store brand Ducolax stool softener. One capsule daily.    [provider]  prochlorperazine  (COMPAZINE ) 10 MG tablet Take 1 tablet (10 mg total) by mouth every 6 (six) hours as needed for nausea or vomiting. 03/10/24   Lanny Callander, MD  vitamin B-12 (CYANOCOBALAMIN) 1000 MCG tablet Take 1 tablet (1,000 mcg total) by mouth daily. 05/11/19   McDiarmid, Krystal BIRCH, MD    Physical Exam: BP (!) 146/78 (BP Location: Left Arm)   Pulse 92   Temp 98.8 F (37.1 C) (Oral)   Resp 15   Ht 5' 2 (1.575 m)   Wt 85.3 kg   SpO2 95%   BMI 34.39 kg/m  General: Pleasant, well-appearing elderly woman laying in bed. No acute distress. HEENT: [place]/AT. Anicteric sclera CV: RRR. No murmurs, rubs, or gallops.  Pulmonary: On 2 L [place]. Lungs CTAB. Normal effort. No wheezing or rales. Abdominal: Soft, nontender, nondistended. Normal bowel sounds. Extremities: Trace  BLE edema. Palpable pedal pulses. Normal ROM. Skin: Warm and dry. No obvious rash or lesions. Neuro: A&Ox3. Moves all extremities. Normal sensation to light touch. No focal deficit. Psych: Normal mood and affect          Labs on Admission:  Basic Metabolic Panel: Recent Labs  Lab 05/30/24 1330 05/30/24 1528  NA 140 138  K 3.8 3.5  CL 107 108  CO2 22 20*  GLUCOSE 191*  147*  BUN 13 13  CREATININE 1.10* 1.02*  CALCIUM  8.2* 8.2*   Liver Function Tests: Recent Labs  Lab 05/30/24 1330 05/30/24 1528  AST 19 23  ALT 21 23  ALKPHOS 64 55  BILITOT 0.2 0.3  PROT 6.0* 5.7*  ALBUMIN 3.2* 2.4*   No results for input(s): LIPASE, AMYLASE in the last 168 hours. No results for input(s): AMMONIA in the last 168 hours. CBC: Recent Labs  Lab 05/30/24 1330 05/30/24 1528  WBC 8.6 9.1  NEUTROABS 4.1 4.6  HGB 8.9* 8.4*  HCT 26.7* 26.6*  MCV 81.2 84.7  PLT 500* 465*   Cardiac Enzymes: No results for input(s): CKTOTAL, CKMB, CKMBINDEX, TROPONINI in the last 168 hours. BNP (last 3 results) Recent Labs    05/30/24 1528  BNP 369.8*    ProBNP (last 3 results) No results for input(s): PROBNP in the last 8760 hours.  CBG: No results for input(s): GLUCAP in the last 168 hours.  Radiological Exams on Admission: CT Angio Chest PE W and/or Wo Contrast Result Date: 05/30/2024 CLINICAL DATA:  Tachycardia and chest pain EXAM: CT ANGIOGRAPHY CHEST WITH CONTRAST TECHNIQUE: Multidetector CT imaging of the chest was performed using the standard protocol during bolus administration of intravenous contrast. Multiplanar CT image reconstructions and MIPs were obtained to evaluate the vascular anatomy. RADIATION DOSE REDUCTION: This exam was performed according to the departmental dose-optimization program which includes automated exposure control, adjustment of the mA and/or kV according to patient size and/or use of iterative reconstruction technique. CONTRAST:  75mL OMNIPAQUE   IOHEXOL  350 MG/ML SOLN COMPARISON:  Chest x-ray from earlier in the same day FINDINGS: Cardiovascular: Atherosclerotic calcifications of the thoracic aorta are noted. No aneurysmal dilatation or dissection is seen. The heart is mildly enlarged. Coronary calcifications are noted. The pulmonary artery shows a normal branching pattern bilaterally. Scattered filling defects are noted bilaterally in segmental and subsegmental pulmonary arterial branches. RV/LV ratio of 1.4 is noted consistent with right heart strain. Mediastinum/Nodes: Thoracic inlet is within normal limits. No hilar or mediastinal adenopathy is noted. The esophagus as visualized is within normal limits. Lungs/Pleura: Mosaic attenuation is noted throughout both lungs consistent with patchy air trapping. Mild atelectatic changes are noted in the left posterior costophrenic angle. No sizable parenchymal nodule is seen. Upper Abdomen: No acute abnormality. Musculoskeletal: Degenerative changes of the thoracic spine are noted. No acute rib abnormality is noted. Review of the MIP images confirms the above findings. IMPRESSION: Positive for acute PE with CT evidence of right heart strain (RV/LV Ratio = 1.4) consistent with at least submassive (intermediate risk) PE. The presence of right heart strain has been associated with an increased risk of morbidity and mortality. Please refer to the Code PE Focused order set in EPIC. No other focal abnormality is noted. Aortic Atherosclerosis (ICD10-I70.0). Critical Value/emergent results were called by telephone at the time of interpretation on 05/30/2024 at 7:13 pm to Dr. SID BONING , who verbally acknowledged these results. Electronically Signed   By: Oneil Devonshire M.D.   On: 05/30/2024 19:16   DG Chest Portable 1 View Result Date: 05/30/2024 CLINICAL DATA:  Shortness of breath. History of left breast carcinoma. EXAM: PORTABLE CHEST 1 VIEW COMPARISON:  PET-CT dated 03/20/2024. FINDINGS: The heart size is within  normal limits for technique. Mediastinal contours are within normal limits. Right chest Port-A-Cath tip overlies the lower SVC. Aortic atherosclerosis. No focal consolidation, pleural effusion, or pneumothorax. No acute osseous abnormality. IMPRESSION: 1. No acute cardiopulmonary findings. 2.  Aortic  Atherosclerosis (ICD10-I70.0). Electronically Signed   By: Harrietta Sherry M.D.   On: 05/30/2024 15:58   Assessment/Plan KYNDEL EGGER is a 73 y.o. female with medical history significant for recently diagnosed left breast cancer on neoadjuvant Enhertu , T2DM, HTN, HLD, neuropathy, ADHD, GERD, and osteoarthritis who presented to the ED for evaluation of shortness of breath.   # Acute pulmonary embolism # Submassive PE # Acute hypoxic respiratory failure - Patient with a history of recently diagnosed breast cancer presented with 5 days of shortness of breath and dyspnea on exertion - CT A/P shows segmented and subsegmental pulmonary embolism with right heart strain - Patient with new O2 requirement of 2 L [place] but hemodynamically stable with SBP in the 120s to 140s - Admit to stepdown unit for close monitoring - Continue IV heparin  - Check echocardiogram and BLE DVT study - Continue supplemental O2, wean as able - Telemetry  # Elevated troponin - Troponins elevated to 103-111 - Patient denies any chest pain - Secondary to demand ischemia in the setting of acute PE - Trend troponin until peaked  # HTN - BP stable with SBP in the 120s to 140s - Continue amlodipine , HCTZ, benazepril  and carvedilol   # T2DM - Last A1c 7.1% 4 months ago - Home regimen includes metformin  - Blood glucose 147 on admission - SSI with meals, CBG monitoring - Resume metformin  at discharge  # Breast cancer - Diagnosed with left breast cancer, estrogen receptor positive in May 2025 - Started neoadjuvant Enhertu  2 months ago - Patient's oncologist, [name], added to care team  # Chronic pain # Neuropathy -  Continue gabapentin  and as needed Norco   DVT prophylaxis: Heparin     Code Status: Full Code  Consults called: Oncology  Family Communication: Discussed admission with spouse at bedside  Severity of Illness: The appropriate patient status for this patient is INPATIENT. Inpatient status is judged to be reasonable and necessary in order to provide the required intensity of service to ensure the patient's safety. The patient's presenting symptoms, physical exam findings, and initial radiographic and laboratory data in the context of their chronic comorbidities is felt to place them at high risk for further clinical deterioration. Furthermore, it is not anticipated that the patient will be medically stable for discharge from the hospital within 2 midnights of admission.   * I certify that at the point of admission it is my clinical judgment that the patient will require inpatient hospital care spanning beyond 2 midnights from the point of admission due to high intensity of service, high risk for further deterioration and high frequency of surveillance required.*  Level of care: Stepdown   This record has been created using Conservation officer, historic buildings. Errors have been sought and corrected, but may not always be located. Such creation errors do not reflect on the standard of care.   Lou Claretta HERO, MD 05/30/2024, 8:20 PM [facility] Pager: [phone] Isaiah 41:10   If 7PM-7AM, please contact night-coverage [email or web address] Password [email or web address]

## 2024-05-30 NOTE — Assessment & Plan Note (Signed)
-  multifocal (UIQ, central, and lateral of left breast), UIQ cT3cN1M0, ER+/PR+/HER2+, central and lateral mass are invasive mammary carcinoma, favor lobular, ER/PR strongly positive, HER2 negative by FISH -presented with screening discovered left breast cancer with nodal metastasis.  - Breast MRI showed 2.5 cm mass in the upper inner quadrant of left breast, additional suspicious clumped linear non-mass enhancement in the retroareolar left measuring 5 cm, and additional small area of non-mass enhancement in the lateral left breast spanning 1.1 cm, 1.3 centimeter abnormal lymph node in the left low axilla, no additional abnormal lymph nodes. -She underwent additional biopsy of the retroareolar and lateral breast mass, which both showed invasive mammary carcinoma, favor lobular, both ER/PR strongly positive, HER2 negative by FISH. -PET negative for distant mets  -she started neoadjuvant Enhertu  on 03/29/2024 - Patient is not interested in surgery or intensive chemotherapy, plan to change her treatment to AI and HER2 antibodies as a maintenance therapy at some point.

## 2024-05-30 NOTE — Progress Notes (Signed)
 [facility]   [phone] [phone]   Clinic Follow up Note   Patient Care Team: McDiarmid, Krystal BIRCH, MD as PCP - General [name], MD as Consulting Physician (Ophthalmology) Mavis Purchase, MD as Consulting Physician (Neurosurgery) Jaye Fallow, MD as Consulting Physician (Ophthalmology) Charlsie Josette SAILOR, RN as [facility] Stephane BROCKS, RN (Inactive) as Oncology Nurse Navigator Tyree Nanetta SAILOR, RN as Oncology Nurse Navigator Lanny Callander, MD as Consulting Physician (Hematology)  Date of Service:  05/30/2024  CHIEF COMPLAINT: Dyspnea and weakness  CURRENT THERAPY:  Enhertu  every 3 weeks  Oncology History   Malignant neoplasm of upper-inner quadrant of left breast in female, estrogen receptor positive (HCC) -multifocal (UIQ, central, and lateral of left breast), UIQ cT3cN1M0, ER+/PR+/HER2+, central and lateral mass are invasive mammary carcinoma, favor lobular, ER/PR strongly positive, HER2 negative by FISH -presented with screening discovered left breast cancer with nodal metastasis.  - Breast MRI showed 2.5 cm mass in the upper inner quadrant of left breast, additional suspicious clumped linear non-mass enhancement in the retroareolar left measuring 5 cm, and additional small area of non-mass enhancement in the lateral left breast spanning 1.1 cm, 1.3 centimeter abnormal lymph node in the left low axilla, no additional abnormal lymph nodes. -She underwent additional biopsy of the retroareolar and lateral breast mass, which both showed invasive mammary carcinoma, favor lobular, both ER/PR strongly positive, HER2 negative by FISH. -PET negative for distant mets  -she started neoadjuvant Enhertu  on 03/29/2024 - Patient is not interested in surgery or intensive chemotherapy, plan to change her treatment to AI and HER2 antibodies as a maintenance therapy at some point. Assessment & Plan Breast cancer, actively managed Breast cancer  is being actively managed with INHER2. Recent ultrasound and mammogram showed a good response to treatment. However, there is a concern for potential side effects from the drug, including lung inflammation similar to pneumonia. - Send to emergency room for further evaluation - Coordinate with emergency room to expedite room availability - Stop current treatment if lung issues are related to INHER2 - Consider alternative treatments if INHER2 is discontinued  Fatigue and dyspnea on exertion Acute onset of fatigue and dyspnea on exertion since Saturday. Differential diagnosis includes drug-induced lung inflammation from [facility] or pulmonary embolism due to cancer or chemotherapy. Concerns include potential blood clots, as both cancer and chemotherapy can increase risk. - Send to emergency room for evaluation and CT scan - Evaluate for blood clots or lung changes in the emergency room  Diarrhea secondary to cancer treatment Persistent diarrhea managed with Lomotil . Imodium was ineffective. Diarrhea is a side effect of cancer treatment.  Poor appetite secondary to cancer treatment Poor appetite worsened in the last few days. Appetite was better after the last treatment but has declined again.  Plan - She developed significant dyspnea, anorexia, and generally weak since since August 9, concerning for PE or Enhertu  induced pneumonitis.  I will send her to the emergency room for stat CT scan - I will follow-up if she gets admitted to hospital.   SUMMARY OF ONCOLOGIC HISTORY: Oncology History  Malignant neoplasm of upper-inner quadrant of left breast in female, estrogen receptor positive (HCC)  02/25/2024 Cancer Staging   Staging form: Breast, AJCC 8th Edition - Clinical stage from 02/25/2024: Stage IB (cT1c, cN1, cM0, G3, ER+, PR+, HER2+) - Signed by Lanny Callander, MD on 03/07/2024 Stage prefix: Initial diagnosis Histologic grading system: 3 grade system   03/07/2024 Initial Diagnosis   Malignant  neoplasm of upper-inner quadrant of left breast in female, estrogen receptor positive (HCC)   03/29/2024 -  Chemotherapy   Patient is on Treatment Plan : BREAST Fam-Trastuzumab Deruxtecan-nxki  (Enhertu ) (5.4) q21d        Discussed the use of AI scribe software for clinical note transcription with the patient, who gave verbal consent to proceed.  History of Present Illness [name] is a 73 year old female with breast cancer who presents with shortness of breath and fatigue.  Shortness of breath and fatigue have been present since Saturday, with slight improvement in severity. Oxygen levels have not been monitored at home.  She experiences poor appetite and diarrhea several times a day, controlled only with Lomotil . Imodium is ineffective. Appetite worsened in the last two to three days after a brief improvement post-treatment.  Leg swelling persists despite elevation and does not reduce overnight as it previously did.  No recent viral infections. No recent CT scan since starting current treatment. She cannot palpate the tumor.     All other systems were reviewed with the patient and are negative.  MEDICAL HISTORY:  Past Medical History:  Diagnosis Date   Acute MEE (middle ear effusion) 06/07/2014   Acute recurrent sinusitis 12/21/2014   Acute sinusitis 12/21/2014   Allergy    Anemia    hx   Asthma, intermittent 11/02/2018   ATTENTION DEFICIT, W/O HYPERACTIVITY 12/16/2006   Bilateral carpal tunnel syndrome 11/20/2016   Cataract    Cervical spondylosis with myelopathy, History of 04/28/2012   COLON POLYP 12/16/2006   (02/03/12) Surgical [Pathology], sigmoid colon, polyp HYPERPLASTIC POLYP(S) AND POLYPOID FRAGMETNS OF BENIGN COLONIC MUCOSA WITH INTRAMUCOSAL LYMPHOID AGGREGATES. NO ADENOMATOUS CHANGE OR MALIGNANCY IDENTIFIED.    DIABETES MELLITUS II, UNCOMPLICATED 12/16/2006   Fall 10/18/2020   GERD (gastroesophageal reflux disease)    H/O abnormal mammogram 07/19/2009   S/P  Breast Biopsy [facility] [place], [place], 07/2009): Benign findings.     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/20/2016   OSTEOARTHRITIS, MULTI SITES 12/16/2006   Perennial allergic rhinitis with seasonal variation 12/16/2006        PONV (postoperative nausea and vomiting)    last surgery only   Recurrent maxillary sinusitis    SACROILIITIS, HISTORY OF 01/08/2009   Qualifier: History of  By: McDiarmid MD, Todd     Seasonal allergies    Seborrheic keratosis 01/15/2016   SKELETAL HYPEROSTOSIS 03/29/2008   Ulcer    Uterine fibroid 02/02/2011   TVUS: 4cm fibroid w/ submucosal component, bil.hydrosalpinges, unable measurable endometrium - 08/18/2004     SURGICAL HISTORY: Past Surgical History:  Procedure Laterality Date   ANTERIOR CERVICAL DECOMP/DISCECTOMY FUSION  04/28/2012   Procedure: ANTERIOR CERVICAL DECOMPRESSION/DISCECTOMY FUSION 3 LEVELS;  Surgeon: Reyes JONETTA Budge, MD;  Location: [facility];  Service: Neurosurgery;  Laterality: N/A;  Cervical three-four,Cervical four-five,Cervical five-six anterior cervical decompression with fusion interbody prothesis plating  and bonegraft   BREAST BIOPSY  07/19/2009   S/P Breast Biopsy [facility], [place], 07/2009): Benign findings.  (10/27/2010)   BREAST BIOPSY Left 02/25/2024   US  LT BREAST BX W LOC DEV 1ST LESION IMG BX SPEC US  GUIDE 02/25/2024 [facility]   BREAST  BIOPSY Left 02/25/2024   US  LT BREAST BX W LOC DEV EA ADD LESION IMG BX SPEC US  GUIDE 02/25/2024 [facility]   BREAST BIOPSY Right 03/02/2024   US  RT BREAST BX W LOC DEV 1ST LESION IMG BX SPEC US  GUIDE 03/02/2024 [facility]AM[facility]   BREAST LUMPECTOMY Left    CARPAL TUNNEL RELEASE Left 12/10/2016   Procedure: LEFT CARPAL TUNNEL RELEASE;  Surgeon: Murrell Kuba, MD;  Location: [facility];  Service: Orthopedics;  Laterality: Left;   COLONOSCOPY W/ POLYPECTOMY  12/17/2000   colonoscopy (Dr Kristie) int. hemorrhoids &  nonneoplatic colon polyps - 01/10/2001,    COLONOSCOPY W/ POLYPECTOMY  01/18/2012   Dr Albertus (GI).sigmoid colon, hyperlastic polyp   ENDOMETRIAL BIOPSY  08/19/2004   Endometrial Biopsy - 09/01/2004,   IR IMAGING GUIDED PORT INSERTION  03/28/2024   NM MYOVIEW  LTD  04/18/2004   Cardiolite:EF63%, no ischemia - 05/01/2004,    REPLACEMENT TOTAL KNEE BILATERAL      I have reviewed the social history and family history with the patient and they are unchanged from previous note.  ALLERGIES:  is allergic to januvia  [sitagliptin ], jardiance  [empagliflozin ], amlodipine , celecoxib, and diclofenac-misoprostol.  MEDICATIONS:  Current Outpatient Medications  Medication Sig Dispense Refill   amLODipine -benazepril  (LOTREL) 10-20 MG capsule TAKE 1 CAPSULE BY MOUTH DAILY 90 capsule 3   Apoaequorin (PREVAGEN PO) Take by mouth.     atorvastatin  (LIPITOR) 40 MG tablet Take 1 tablet (40 mg total) by mouth daily. 90 tablet 3   blood glucose meter kit and supplies KIT Dispense based on patient and insurance preference. Use up to four times daily as directed. 1 each 0   Blood Glucose Monitoring Suppl DEVI 1 each by Does not apply route in the morning, at noon, and at bedtime. May substitute to any manufacturer covered by patient's insurance. 1 each 0   carvedilol  (COREG ) 6.25 MG tablet TAKE 1 TABLET(6.25 MG) BY MOUTH TWICE DAILY WITH A MEAL 180 tablet 3   diphenoxylate -atropine  (LOMOTIL )  2.5-0.025 MG tablet Take 1-2 tablets by mouth 4 (four) times daily as needed for diarrhea or loose stools. 90 tablet 1   fexofenadine  (ALLEGRA ) 180 MG tablet Take 180 mg by mouth daily.     fluconazole  (DIFLUCAN ) 150 MG tablet TAKE 1 TABLET BY MOUTH, THEN AGAIN IN 3 DAYS 2 tablet 3   gabapentin  (NEURONTIN ) 100 MG capsule TAKE 1 CAPSULE(100 MG) BY MOUTH THREE TIMES DAILY AS NEEDED 270 capsule 3   hydrochlorothiazide  (HYDRODIURIL ) 25 MG tablet TAKE 1 TABLET(25 MG) BY MOUTH DAILY 90 tablet 3   HYDROcodone -acetaminophen  (NORCO) 7.5-325 MG tablet Take 1 tablet by mouth every 8 (eight) hours as needed for moderate pain (pain score 4-6). 90 tablet 0   lidocaine -prilocaine  (EMLA ) cream Apply 1 Application topically daily as needed. Use as directed for port access 30 g 3   metFORMIN  (GLUCOPHAGE -XR) 500 MG 24 hr tablet TAKE 1 TABLET(500 MG) BY MOUTH TWICE DAILY WITH A MEAL 180 tablet 3   methylphenidate  (RITALIN ) 10 MG tablet Take 2 tablets (20 mg total) by mouth 3 (three) times daily with meals. 180 tablet 0   naproxen  (NAPROSYN ) 500 MG tablet TAKE 1 TABLET(500 MG) BY MOUTH  TWICE DAILY WITH A MEAL 180 tablet 5   ondansetron  (ZOFRAN ) 8 MG tablet Take 1 tablet (8 mg total) by mouth every 8 (eight) hours as needed for nausea or vomiting. Start on the third day after chemotherapy. 30 tablet 1   OVER THE COUNTER MEDICATION Store brand Ducolax stool softener. One capsule daily.     prochlorperazine  (COMPAZINE ) 10 MG tablet Take 1 tablet (10 mg total) by mouth every 6 (six) hours as needed for nausea or vomiting. 30 tablet 1   vitamin B-12 (CYANOCOBALAMIN) 1000 MCG tablet Take 1 tablet (1,000 mcg total) by mouth daily. 180 tablet 3   No current facility-administered medications for this visit.    PHYSICAL EXAMINATION: ECOG PERFORMANCE STATUS: 3 - Symptomatic, >50% confined to bed  Vitals:   05/30/24 1351  BP: 123/63  Pulse: (!) 103  Resp: 19  Temp: 98.2 F (36.8 C)  SpO2: 95%   Wt Readings from Last 3  Encounters:  05/30/24 188 lb (85.3 kg)  05/30/24 188 lb 12.8 oz (85.6 kg)  05/09/24 192 lb 3.2 oz (87.2 kg)     GENERAL:alert, no distress and comfortable SKIN: skin color, texture, turgor are normal, no rashes or significant lesions EYES: normal, Conjunctiva are pink and non-injected, sclera clear NECK: supple, thyroid  normal size, non-tender, without nodularity LYMPH:  no palpable lymphadenopathy in the cervical, axillary  LUNGS: clear to auscultation and percussion with normal breathing effort HEART: regular rate & rhythm and no murmurs and no lower extremity edema ABDOMEN:abdomen soft, non-tender and normal bowel sounds Musculoskeletal:no cyanosis of digits and no clubbing  NEURO: alert & oriented x 3 with fluent speech, no focal motor/sensory deficits  Physical Exam CHEST: Lungs clear to auscultation bilaterally.  LABORATORY DATA:  I have reviewed the data as listed    Latest Ref Rng & Units 05/30/2024    1:30 PM 05/09/2024    1:48 PM 04/19/2024   12:50 PM  CBC  WBC 4.0 - 10.5 K/uL 8.6  6.8  10.4   Hemoglobin 12.0 - 15.0 g/dL 8.9  8.6  9.6   Hematocrit 36.0 - 46.0 % 26.7  26.6  29.1   Platelets 150 - 400 K/uL 500  472  697         Latest Ref Rng & Units 05/30/2024    1:30 PM 05/09/2024    1:48 PM 04/19/2024   12:50 PM  CMP  Glucose 70 - 99 mg/dL 808  877  886   BUN 8 - 23 mg/dL 13  13  16    Creatinine 0.44 - 1.00 mg/dL 8.89  9.05  9.18   Sodium 135 - 145 mmol/L 140  140  137   Potassium 3.5 - 5.1 mmol/L 3.8  4.2  4.5   Chloride 98 - 111 mmol/L 107  107  104   CO2 22 - 32 mmol/L 22  27  27    Calcium  8.9 - 10.3 mg/dL 8.2  8.7  8.4   Total Protein 6.5 - 8.1 g/dL 6.0  6.1  5.7   Total Bilirubin 0.0 - 1.2 mg/dL 0.2  0.2  0.1   Alkaline Phos 38 - 126 U/L 64  58  52   AST 15 - 41 U/L 19  28  18    ALT 0 - 44 U/L 21  38  33       RADIOGRAPHIC STUDIES: I have personally reviewed the radiological images as listed and agreed with the findings in the report. DG Chest  Portable 1  View Result Date: 05/30/2024 CLINICAL DATA:  Shortness of breath. History of left breast carcinoma. EXAM: PORTABLE CHEST 1 VIEW COMPARISON:  PET-CT dated 03/20/2024. FINDINGS: The heart size is within normal limits for technique. Mediastinal contours are within normal limits. Right chest Port-A-Cath tip overlies the lower SVC. Aortic atherosclerosis. No focal consolidation, pleural effusion, or pneumothorax. No acute osseous abnormality. IMPRESSION: 1. No acute cardiopulmonary findings. 2.  Aortic Atherosclerosis (ICD10-I70.0). Electronically Signed   By: Harrietta Sherry M.D.   On: 05/30/2024 15:58      No orders of the defined types were placed in this encounter.  All questions were answered. The patient knows to call the clinic with any problems, questions or concerns. No barriers to learning was detected. The total time spent in the appointment was 40 minutes, including review of chart and various tests results, discussions about plan of care and coordination of care plan     Onita Mattock, MD 05/30/2024

## 2024-05-30 NOTE — Progress Notes (Signed)
 Report given to Darryle, RN of [facility] ED regarding pt's ED admission.  Stated pt seen in clinic by [name] with tachycardia, SOB x Saturday night; watery stools, and BLE edema.  Pt here today for Enhertu  but tx held d/t clinical instability.  [name] requested for pt to be seen in ED to r/o pneumonitis and PE.  Enhertu  held today d/t to pt's clinical finding.  Took pt to ED stable w/RT Chest port access and husband at bedside.  Lft pt in ED room w/ED RN at bedside.

## 2024-05-30 NOTE — ED Notes (Addendum)
 Pt ambulated to the bathroom and back. She was struggling to get a breath in but, O2 remained 90-92 (Pulse went up to 130s). When situated in the room O2 dropped to 80s and slowly came back up while at rest.

## 2024-05-30 NOTE — ED Notes (Signed)
 Pt assisted to the [facility] - family present at the bedside - call bell within reach.

## 2024-05-30 NOTE — ED Provider Notes (Signed)
  EMERGENCY DEPARTMENT AT [facility] Provider Note   CSN: [id number] Arrival date & time: 05/30/24  1446     History  Chief Complaint  Patient presents with   Shortness of Breath    [name] is a 73 y.o. female with breast cancer on chemotherapy Enhertu  who presents brought over from cancer center. She was prepping for treatment but mentioned new dyspnea on exertion beginning on Saturday.  States she cannot walk to the bathroom without becoming short of breath.  At her appointment, her heart rate was elevated and her doctor was concerned about PE or Enhertu -induced pneumonitis. She always has nonbloody diarrhea with her chemotherapy.  She denies any chest pain, fever/chills, cough, hemoptysis, nausea vomiting, urinary symptoms.  Does endorse leg swelling which is not abnormal for her but it is not going away, which is abnormal.  No flulike symptoms or sick contacts.   Past Medical History:  Diagnosis Date   Acute MEE (middle ear effusion) 06/07/2014   Acute recurrent sinusitis 12/21/2014   Acute sinusitis 12/21/2014   Allergy    Anemia    hx   Asthma, intermittent 11/02/2018   ATTENTION DEFICIT, W/O HYPERACTIVITY 12/16/2006   Bilateral carpal tunnel syndrome 11/20/2016   Cataract    Cervical spondylosis with myelopathy, History of 04/28/2012   COLON POLYP 12/16/2006   (02/03/12) Surgical [Pathology], sigmoid colon, polyp HYPERPLASTIC POLYP(S) AND POLYPOID FRAGMETNS OF BENIGN COLONIC MUCOSA WITH INTRAMUCOSAL LYMPHOID AGGREGATES. NO ADENOMATOUS CHANGE OR MALIGNANCY IDENTIFIED.    DIABETES MELLITUS II, UNCOMPLICATED 12/16/2006   Fall 10/18/2020   GERD (gastroesophageal reflux disease)    H/O abnormal mammogram 07/19/2009   S/P Breast Biopsy [facility], [place], 07/2009): Benign findings.     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/20/2016   OSTEOARTHRITIS, MULTI SITES 12/16/2006   Perennial allergic rhinitis with seasonal variation 12/16/2006        PONV (postoperative nausea and vomiting)    last surgery only   Recurrent maxillary sinusitis    SACROILIITIS, HISTORY OF 01/08/2009   Qualifier: History of  By: McDiarmid MD, Todd     Seasonal allergies    Seborrheic keratosis 01/15/2016   SKELETAL HYPEROSTOSIS 03/29/2008   Ulcer    Uterine fibroid 02/02/2011   TVUS: 4cm fibroid w/ submucosal component, bil.hydrosalpinges, unable measurable endometrium - 08/18/2004        Home Medications Prior to Admission medications   Medication Sig Start Date End Date Taking? Authorizing Provider  amLODipine -benazepril  (LOTREL) 10-20 MG capsule TAKE 1 CAPSULE BY MOUTH DAILY 01/28/24   McDiarmid, Krystal BIRCH, MD  Apoaequorin (PREVAGEN PO) Take by mouth.    [provider]  atorvastatin  (LIPITOR) 40 MG tablet Take 1 tablet (40 mg total) by mouth daily. 05/11/19   McDiarmid, Krystal BIRCH, MD  blood glucose meter kit and supplies KIT Dispense based on patient and insurance preference. Use up  to four times daily as directed. 12/01/22   Christia Budds, MD  Blood Glucose Monitoring Suppl DEVI 1 each by Does not apply route in the morning, at noon, and at bedtime. May substitute to any manufacturer covered by patient's insurance. 12/01/22   Christia Budds, MD  carvedilol  (COREG ) 6.25 MG tablet TAKE 1 TABLET(6.25 MG) BY MOUTH TWICE DAILY WITH A MEAL 11/18/23   McDiarmid, Krystal BIRCH, MD   diphenoxylate -atropine  (LOMOTIL ) 2.5-0.025 MG tablet Take 1-2 tablets by mouth 4 (four) times daily as needed for diarrhea or loose stools. 05/09/24   Lanny Callander, MD  fexofenadine  (ALLEGRA ) 180 MG tablet Take 180 mg by mouth daily.    [provider]  fluconazole  (DIFLUCAN ) 150 MG tablet TAKE 1 TABLET BY MOUTH, THEN AGAIN IN 3 DAYS 11/23/23   McDiarmid, Krystal BIRCH, MD  gabapentin  (NEURONTIN ) 100 MG capsule TAKE 1 CAPSULE(100 MG) BY MOUTH THREE TIMES DAILY AS NEEDED 04/28/24   McDiarmid, Krystal BIRCH, MD  hydrochlorothiazide  (HYDRODIURIL ) 25 MG tablet TAKE 1 TABLET(25 MG) BY MOUTH DAILY 02/07/24   McDiarmid, Krystal BIRCH, MD  HYDROcodone -acetaminophen  (NORCO) 7.5-325 MG tablet Take 1 tablet by mouth every 8 (eight) hours as needed for moderate pain (pain score 4-6). 04/05/24 05/05/24  McDiarmid, Krystal BIRCH, MD  lidocaine -prilocaine  (EMLA ) cream Apply 1 Application topically daily as needed. Use as directed for port access 04/04/24   Boscia, Heather E, NP  metFORMIN  (GLUCOPHAGE -XR) 500 MG 24 hr tablet TAKE 1 TABLET(500 MG) BY MOUTH TWICE DAILY WITH A MEAL 08/09/23   McDiarmid, Krystal BIRCH, MD  methylphenidate  (RITALIN ) 10 MG tablet Take 2 tablets (20 mg total) by mouth 3 (three) times daily with meals. 04/05/24 05/05/24  McDiarmid, Krystal BIRCH, MD  naproxen  (NAPROSYN ) 500 MG tablet TAKE 1 TABLET(500 MG) BY MOUTH TWICE DAILY WITH A MEAL 04/28/24   McDiarmid, Krystal BIRCH, MD  ondansetron  (ZOFRAN ) 8 MG tablet Take 1 tablet (8 mg total) by mouth every 8 (eight) hours as needed for nausea or vomiting. Start on the third day after chemotherapy. 03/10/24   Lanny Callander, MD  OVER THE COUNTER MEDICATION Store brand Ducolax stool softener. One capsule daily.    [provider]  prochlorperazine  (COMPAZINE ) 10 MG tablet Take 1 tablet (10 mg total) by mouth every 6 (six) hours as needed for nausea or vomiting. 03/10/24   Lanny Callander, MD  vitamin B-12 (CYANOCOBALAMIN) 1000 MCG tablet Take 1 tablet (1,000 mcg total) by mouth daily. 05/11/19   McDiarmid,  Krystal BIRCH, MD      Allergies    Januvia  Brinley.Bullion ], Jardiance  [empagliflozin ], Amlodipine , Celecoxib, and Diclofenac-misoprostol    Review of Systems   Review of Systems A 10 point review of systems was performed and is negative unless otherwise reported in HPI.  Physical Exam Updated Vital Signs BP 122/66 (BP Location: Right Arm)   Pulse 93   Temp 99.3 F (37.4 C) (Oral)   Resp 16   Ht 5' 2 (1.575 m)   Wt 85.3 kg   SpO2 92%   BMI 34.39 kg/m  Physical Exam General: Normal appearing female, lying in bed.  HEENT: PERRLA, Sclera anicteric, MMM, trachea midline.  Cardiology: RRR, no murmurs/rubs/gallops. BL radial and DP pulses equal bilaterally.  Resp: Normal respiratory rate and effort. CTAB, no wheezes, rhonchi, crackles.  Abd: Soft, non-tender, non-distended. No rebound tenderness or guarding.  GU: Deferred. MSK: No peripheral edema or signs of trauma. Extremities without deformity or TTP. No cyanosis or clubbing. Skin: warm, dry. No rashes or lesions. Back:  No CVA tenderness Neuro: A&Ox4, CNs II-XII grossly intact. MAEs. Sensation grossly intact.  Psych: Normal mood and affect.   ED Results / Procedures / Treatments   Labs (all labs ordered are listed, but only abnormal results are displayed) Labs Reviewed  CBC WITH DIFFERENTIAL/PLATELET - Abnormal; Notable for the following components:      Result Value   RBC 3.14 (*)    Hemoglobin 8.4 (*)    HCT 26.6 (*)    RDW 15.9 (*)    Platelets 465 (*)    nRBC 0.4 (*)    Monocytes Absolute 1.2 (*)    Abs Immature Granulocytes 0.11 (*)    All other components within normal limits  COMPREHENSIVE METABOLIC PANEL WITH GFR - Abnormal; Notable for the following components:   CO2 20 (*)    Glucose, Bld 147 (*)    Creatinine, Ser 1.02 (*)    Calcium  8.2 (*)    Total Protein 5.7 (*)    Albumin 2.4 (*)    GFR, Estimated 58 (*)    All other components within normal limits  BRAIN NATRIURETIC PEPTIDE - Abnormal; Notable for the  following components:   B Natriuretic Peptide 369.8 (*)    All other components within normal limits  TROPONIN I (HIGH SENSITIVITY) - Abnormal; Notable for the following components:   Troponin I (High Sensitivity) 103 (*)    All other components within normal limits  TROPONIN I (HIGH SENSITIVITY) - Abnormal; Notable for the following components:   Troponin I (High Sensitivity) 111 (*)    All other components within normal limits  RESP PANEL BY RT-PCR (RSV, FLU A&B, COVID)  [id number]  BASIC METABOLIC PANEL WITH GFR  HEPARIN  LEVEL (UNFRACTIONATED)  CBC    EKG EKG Interpretation Date/Time:  Tuesday May 30 2024 14:59:26 EDT Ventricular Rate:  99 PR Interval:  144 QRS Duration:  93 QT Interval:  386 QTC Calculation: 496 R Axis:   66  Text Interpretation: Sinus rhythm Low voltage, precordial leads Nonspecific T abnormalities, diffuse leads Borderline prolonged QT interval Confirmed by Franklyn Gills [phone]) on 05/30/2024 9:32:04 PM to finish my notes  Radiology CT Angio Chest PE W and/or Wo Contrast Result Date: 05/30/2024 CLINICAL DATA:  Tachycardia and chest pain EXAM: CT ANGIOGRAPHY CHEST WITH CONTRAST TECHNIQUE: Multidetector CT imaging of the chest was performed using the standard protocol during bolus administration of intravenous contrast. Multiplanar CT image reconstructions and MIPs were obtained to evaluate the vascular anatomy. RADIATION DOSE REDUCTION: This exam was performed according to the departmental dose-optimization program which includes automated exposure control, adjustment of the mA and/or kV according to patient size and/or use of iterative reconstruction technique. CONTRAST:  75mL OMNIPAQUE  IOHEXOL  350 MG/ML SOLN COMPARISON:  Chest x-ray from earlier in the same day FINDINGS: Cardiovascular: Atherosclerotic calcifications of the thoracic aorta are noted. No aneurysmal dilatation or dissection is seen. The heart is mildly enlarged. Coronary calcifications are noted. The  pulmonary artery shows a normal branching pattern bilaterally. Scattered filling defects are noted bilaterally in segmental and subsegmental pulmonary arterial branches. RV/LV ratio of 1.4 is noted consistent with right heart strain. Mediastinum/Nodes: Thoracic inlet is within normal limits. No hilar or mediastinal adenopathy is noted. The esophagus as visualized is within normal limits. Lungs/Pleura: Mosaic attenuation is noted throughout both lungs consistent with patchy air trapping. Mild atelectatic changes are noted in the left posterior costophrenic angle. No sizable parenchymal nodule is seen. Upper Abdomen: No acute abnormality. Musculoskeletal: Degenerative changes of the thoracic spine are  noted. No acute rib abnormality is noted. Review of the MIP images confirms the above findings. IMPRESSION: Positive for acute PE with CT evidence of right heart strain (RV/LV Ratio = 1.4) consistent with at least submassive (intermediate risk) PE. The presence of right heart strain has been associated with an increased risk of morbidity and mortality. Please refer to the Code PE Focused order set in EPIC. No other focal abnormality is noted. Aortic Atherosclerosis (ICD10-I70.0). Critical Value/emergent results were called by telephone at the time of interpretation on 05/30/2024 at 7:13 pm to Dr. SID BONING , who verbally acknowledged these results. Electronically Signed   By: Oneil Devonshire M.D.   On: 05/30/2024 19:16   DG Chest Portable 1 View Result Date: 05/30/2024 CLINICAL DATA:  Shortness of breath. History of left breast carcinoma. EXAM: PORTABLE CHEST 1 VIEW COMPARISON:  PET-CT dated 03/20/2024. FINDINGS: The heart size is within normal limits for technique. Mediastinal contours are within normal limits. Right chest Port-A-Cath tip overlies the lower SVC. Aortic atherosclerosis. No focal consolidation, pleural effusion, or pneumothorax. No acute osseous abnormality. IMPRESSION: 1. No acute cardiopulmonary  findings. 2.  Aortic Atherosclerosis (ICD10-I70.0). Electronically Signed   By: Harrietta Sherry M.D.   On: 05/30/2024 15:58    Procedures .Critical Care  Performed by: BONING SID SAILOR, MD Authorized by: BONING SID SAILOR, MD   Critical care provider statement:    Critical care time (minutes):  30   Critical care was necessary to treat or prevent imminent or life-threatening deterioration of the following conditions:  Respiratory failure and circulatory failure   Critical care was time spent personally by me on the following activities:  Development of treatment plan with patient or surrogate, discussions with consultants, evaluation of patient's response to treatment, examination of patient, ordering and review of laboratory studies, ordering and review of radiographic studies, ordering and performing treatments and interventions, pulse oximetry, re-evaluation of patient's condition, review of old charts and obtaining history from patient or surrogate   Care discussed with: admitting provider       Medications Ordered in ED Medications  heparin  ADULT infusion 100 units/mL (25000 units/250mL) (1,100 Units/hr Intravenous New Bag/Given 05/30/24 2005)  acetaminophen  (TYLENOL ) tablet 650 mg (has no administration in time range)    Or  acetaminophen  (TYLENOL ) suppository 650 mg (has no administration in time range)  senna-docusate (Senokot-S) tablet 1 tablet (has no administration in time range)  bisacodyl  (DULCOLAX) EC tablet 5 mg (has no administration in time range)  ondansetron  (ZOFRAN ) tablet 4 mg (has no administration in time range)    Or  ondansetron  (ZOFRAN ) injection 4 mg (has no administration in time range)  iohexol  (OMNIPAQUE ) 350 MG/ML injection 75 mL (75 mLs Intravenous Contrast Given 05/30/24 1829)  heparin  bolus via infusion 2,000 Units (2,000 Units Intravenous Bolus from Bag 05/30/24 2005)    ED Course/ Medical Decision Making/ A&P                          Medical Decision  Making Amount and/or Complexity of Data Reviewed Labs: ordered. Decision-making details documented in ED Course. Radiology: ordered. Decision-making details documented in ED Course.  Risk Prescription drug management. Decision regarding hospitalization.    This patient presents to the ED for concern of dyspnea on exertion, this involves an extensive number of treatment options, and is a complaint that carries with it a high risk of complications and morbidity.  I considered the following differential and admission for this  acute, potentially life threatening condition.   MDM:    DDX for dyspnea includes but is not limited to:  CHF exacerbation (last echo 5 2025 w/ LVEF 60-65%, no RV dysfunction)/pulmonary edema or pleural effusion, PE, or Enhertu -induced pneumonitis. No fevers/chills to indicate PNA. Resp panel neg. No CP or EKG abnormalities to suggest ischemia. SpO2 while ambulating dropped into the 80s%, and on return to her bed patient was placed on 1 L nasal cannula for comfort, satting 95%.  Unfortunately, patient CT PE did demonstrate bilateral pulmonary emboli with some degree of right heart strain.  Indeed also patient's troponin was 103 --> 111.  Ordered heparin  per pharmacy consult.  Patient with no history of significant rectal bleeding or brain surgery/intracranial hemorrhage.  Discussed with the patient and her husband.  Patient consulted to medicine and will be admitted to stepdown..   Clinical Course as of 05/30/24 2131  Tue May 30, 2024  1602 DG Chest Portable 1 View 1. No acute cardiopulmonary findings. 2.  Aortic Atherosclerosis (ICD10-I70.0).   [HN]  1721 Resp panel by RT-PCR (RSV, Flu A&B, Covid) Anterior Nasal Swab neg [HN]  1721 Troponin I (High Sensitivity)(!!): 103 Elevated troponin, awaiting CT PE [HN]  1916 Bilateral pulmonary emboli with some degree of right heart strain, 1.4. [HN]  1919 Troponin I (High Sensitivity)(!!): 111 Elevated troponin in s/o  pulmonary embolism.  [HN]    Clinical Course User Index [HN] Franklyn Sid SAILOR, MD    Labs: I Ordered, and personally interpreted labs.  The pertinent results include:  those listed above  Imaging Studies ordered: I ordered imaging studies including CXR, CT PE I independently visualized and interpreted imaging. I agree with the radiologist interpretation  Additional history obtained from chart review, husband at bedside.    Cardiac Monitoring: The patient was maintained on a cardiac monitor.  I personally viewed and interpreted the cardiac monitored which showed an underlying rhythm of: NSR  Reevaluation: After the interventions noted above, I reevaluated the patient and found that they have :improved  Social Determinants of Health: Lives independently  Disposition: Admit to stepdown  Co morbidities that complicate the patient evaluation  Past Medical History:  Diagnosis Date   Acute MEE (middle ear effusion) 06/07/2014   Acute recurrent sinusitis 12/21/2014   Acute sinusitis 12/21/2014   Allergy    Anemia    hx   Asthma, intermittent 11/02/2018   ATTENTION DEFICIT, W/O HYPERACTIVITY 12/16/2006   Bilateral carpal tunnel syndrome 11/20/2016   Cataract    Cervical spondylosis with myelopathy, History of 04/28/2012   COLON POLYP 12/16/2006   (02/03/12) Surgical [Pathology], sigmoid colon, polyp HYPERPLASTIC POLYP(S) AND POLYPOID FRAGMETNS OF BENIGN COLONIC MUCOSA WITH INTRAMUCOSAL LYMPHOID AGGREGATES. NO ADENOMATOUS CHANGE OR MALIGNANCY IDENTIFIED.    DIABETES MELLITUS II, UNCOMPLICATED 12/16/2006   Fall 10/18/2020   GERD (gastroesophageal reflux disease)    H/O abnormal mammogram 07/19/2009   S/P Breast Biopsy [facility], [place], 07/2009): Benign findings.     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/20/2016   OSTEOARTHRITIS, MULTI SITES 12/16/2006   Perennial allergic rhinitis with seasonal variation 12/16/2006        PONV (postoperative nausea and vomiting)    last surgery only   Recurrent maxillary sinusitis    SACROILIITIS, HISTORY OF 01/08/2009   Qualifier: History of  By: McDiarmid MD, Todd     Seasonal allergies    Seborrheic keratosis 01/15/2016   SKELETAL HYPEROSTOSIS 03/29/2008   Ulcer    Uterine fibroid 02/02/2011   TVUS: 4cm fibroid w/ submucosal component, bil.hydrosalpinges, unable measurable endometrium - 08/18/2004      Medicines No orders of the defined types were placed in this encounter.   I have reviewed the patients home medicines and have made adjustments as needed  Problem List / ED Course: Problem List Items Addressed This Visit       Cardiovascular and Mediastinum   * (Principal) Acute pulmonary embolism (HCC)   Relevant Medications   heparin  ADULT infusion 100 units/mL (25000 units/250mL)   Other Visit Diagnoses       Acute hypoxic respiratory failure (HCC)    -  Primary                   This note was created using dictation software, which may contain spelling or grammatical errors.    Franklyn Sid SAILOR, MD 05/30/24 2133

## 2024-05-30 NOTE — ED Triage Notes (Signed)
 Pt brought over from cancer center. She was prepping for treatment but her heart rate was elevated and she states she is concerned about PE. Pt states she started feeling SOB Saturday with diarrhea.

## 2024-05-31 ENCOUNTER — Other Ambulatory Visit (HOSPITAL_COMMUNITY): Payer: Self-pay

## 2024-05-31 ENCOUNTER — Inpatient Hospital Stay (HOSPITAL_COMMUNITY)

## 2024-05-31 ENCOUNTER — Encounter (HOSPITAL_COMMUNITY): Payer: Self-pay

## 2024-05-31 ENCOUNTER — Telehealth (HOSPITAL_COMMUNITY): Payer: Self-pay | Admitting: Pharmacy Technician

## 2024-05-31 ENCOUNTER — Encounter: Payer: Self-pay | Admitting: Hematology

## 2024-05-31 DIAGNOSIS — C50919 Malignant neoplasm of unspecified site of unspecified female breast: Secondary | ICD-10-CM | POA: Diagnosis not present

## 2024-05-31 DIAGNOSIS — I2609 Other pulmonary embolism with acute cor pulmonale: Secondary | ICD-10-CM | POA: Diagnosis not present

## 2024-05-31 DIAGNOSIS — D649 Anemia, unspecified: Secondary | ICD-10-CM | POA: Diagnosis not present

## 2024-05-31 DIAGNOSIS — I2699 Other pulmonary embolism without acute cor pulmonale: Secondary | ICD-10-CM | POA: Diagnosis not present

## 2024-05-31 DIAGNOSIS — J9601 Acute respiratory failure with hypoxia: Principal | ICD-10-CM

## 2024-05-31 DIAGNOSIS — M7989 Other specified soft tissue disorders: Secondary | ICD-10-CM

## 2024-05-31 LAB — CBC
HCT: 23.2 % — ABNORMAL LOW (ref 36.0–46.0)
Hemoglobin: 7.1 g/dL — ABNORMAL LOW (ref 12.0–15.0)
MCH: 25.9 pg — ABNORMAL LOW (ref 26.0–34.0)
MCHC: 30.6 g/dL (ref 30.0–36.0)
MCV: 84.7 fL (ref 80.0–100.0)
Platelets: 398 K/uL (ref 150–400)
RBC: 2.74 MIL/uL — ABNORMAL LOW (ref 3.87–5.11)
RDW: 15.6 % — ABNORMAL HIGH (ref 11.5–15.5)
WBC: 8 K/uL (ref 4.0–10.5)
nRBC: 0.5 % — ABNORMAL HIGH (ref 0.0–0.2)

## 2024-05-31 LAB — GLUCOSE, CAPILLARY
Glucose-Capillary: 159 mg/dL — ABNORMAL HIGH (ref 70–99)
Glucose-Capillary: 135 mg/dL — ABNORMAL HIGH (ref 70–99)
Glucose-Capillary: 117 mg/dL — ABNORMAL HIGH (ref 70–99)
Glucose-Capillary: 117 mg/dL — ABNORMAL HIGH (ref 70–99)

## 2024-05-31 LAB — BASIC METABOLIC PANEL WITH GFR
Anion gap: 7 (ref 5–15)
BUN: 10 mg/dL (ref 8–23)
CO2: 23 mmol/L (ref 22–32)
Calcium: 7.8 mg/dL — ABNORMAL LOW (ref 8.9–10.3)
Chloride: 108 mmol/L (ref 98–111)
Creatinine, Ser: 0.82 mg/dL (ref 0.44–1.00)
GFR, Estimated: >60 mL/min (ref >60)
Glucose, Bld: 118 mg/dL — ABNORMAL HIGH (ref 70–99)
Potassium: 3.3 mmol/L — ABNORMAL LOW (ref 3.5–5.1)
Sodium: 138 mmol/L (ref 135–145)

## 2024-05-31 LAB — MRSA NEXT GEN BY PCR, NASAL: MRSA by PCR Next Gen: NOT DETECTED

## 2024-05-31 LAB — HEPARIN LEVEL (UNFRACTIONATED)
Heparin Unfractionated: 0.29 [IU]/mL — ABNORMAL LOW (ref 0.30–0.70)
Heparin Unfractionated: 0.42 [IU]/mL (ref 0.30–0.70)

## 2024-05-31 LAB — HEMOGLOBIN A1C
Hgb A1c MFr Bld: 7 % — ABNORMAL HIGH (ref 4.8–5.6)
Mean Plasma Glucose: 154 mg/dL

## 2024-05-31 LAB — TROPONIN I (HIGH SENSITIVITY)
Troponin I (High Sensitivity): 93 ng/L — ABNORMAL HIGH (ref <18)
Troponin I (High Sensitivity): 77 ng/L — ABNORMAL HIGH (ref <18)

## 2024-05-31 LAB — LACTIC ACID, PLASMA
Lactic Acid, Venous: 1.1 mmol/L (ref 0.5–1.9)
Lactic Acid, Venous: 0.8 mmol/L (ref 0.5–1.9)

## 2024-05-31 LAB — MAGNESIUM: Magnesium: 1.4 mg/dL — ABNORMAL LOW (ref 1.7–2.4)

## 2024-05-31 MED ORDER — POTASSIUM CHLORIDE CRYS ER 10 MEQ PO TBCR
30.0000 meq | EXTENDED_RELEASE_TABLET | Freq: Once | ORAL | Status: AC
  Administered 2024-05-31 (×2): 30 meq via ORAL
  Filled 2024-05-31: qty 1

## 2024-05-31 MED ORDER — SODIUM CHLORIDE 0.9 % IV SOLN
12.0000 mg | Freq: Once | INTRAVENOUS | Status: DC
  Filled 2024-05-31: qty 12

## 2024-05-31 MED ORDER — ORAL CARE MOUTH RINSE
15.0000 mL | OROMUCOSAL | Status: DC | PRN
Start: 2024-05-31 — End: 2024-06-03

## 2024-05-31 MED ORDER — SODIUM CHLORIDE 0.9% FLUSH
3.0000 mL | Freq: Two times a day (BID) | INTRAVENOUS | Status: DC
  Administered 2024-05-31 – 2024-06-03 (×7): 3 mL via INTRAVENOUS

## 2024-05-31 MED ORDER — SODIUM CHLORIDE 0.9 % IV SOLN: INTRAVENOUS | Status: AC

## 2024-05-31 MED ORDER — SODIUM CHLORIDE 0.9 % IV SOLN: 250.0000 mL | INTRAVENOUS | Status: AC | PRN

## 2024-05-31 MED ORDER — MEDIHONEY WOUND/BURN DRESSING EX PSTE
1.0000 | PASTE | Freq: Every day | CUTANEOUS | Status: DC
  Administered 2024-05-31 – 2024-06-03 (×5): 1 via TOPICAL
  Filled 2024-05-31: qty 44

## 2024-05-31 MED ORDER — CHLORHEXIDINE GLUCONATE CLOTH 2 % EX PADS
6.0000 | MEDICATED_PAD | Freq: Every day | CUTANEOUS | Status: DC
  Administered 2024-05-31 – 2024-06-03 (×5): 6 via TOPICAL

## 2024-05-31 MED ORDER — SODIUM CHLORIDE 0.9% FLUSH: 10.0000 mL | INTRAVENOUS | Status: DC | PRN

## 2024-05-31 MED ORDER — ALTEPLASE (PULMONARY EMBOLISM) INFUSION: 100.0000 mg | Freq: Once | INTRAVENOUS | Status: DC

## 2024-05-31 MED ORDER — INSULIN ASPART 100 UNIT/ML IJ SOLN
0.0000 [IU] | Freq: Three times a day (TID) | INTRAMUSCULAR | Status: DC
  Administered 2024-05-31: 2 [IU] via SUBCUTANEOUS
  Administered 2024-05-31: 3 [IU] via SUBCUTANEOUS
  Administered 2024-05-31: 2 [IU] via SUBCUTANEOUS
  Administered 2024-05-31: 3 [IU] via SUBCUTANEOUS
  Administered 2024-06-01 – 2024-06-02 (×5): 2 [IU] via SUBCUTANEOUS
  Administered 2024-06-03: 3 [IU] via SUBCUTANEOUS

## 2024-05-31 MED ORDER — SODIUM CHLORIDE 0.9% FLUSH: 3.0000 mL | INTRAVENOUS | Status: DC | PRN

## 2024-05-31 MED ORDER — ENSURE PLUS HIGH PROTEIN PO LIQD: 237.0000 mL | Freq: Two times a day (BID) | ORAL | Status: DC

## 2024-05-31 MED ORDER — MAGNESIUM SULFATE 2 GM/50ML IV SOLN
2.0000 g | Freq: Once | INTRAVENOUS | Status: AC
  Administered 2024-05-31 (×2): 2 g via INTRAVENOUS
  Filled 2024-05-31: qty 50

## 2024-05-31 MED ORDER — SODIUM CHLORIDE 0.9% FLUSH
10.0000 mL | Freq: Two times a day (BID) | INTRAVENOUS | Status: DC
  Administered 2024-05-31: 10 mL
  Administered 2024-05-31: 20 mL
  Administered 2024-05-31 (×3): 10 mL
  Administered 2024-05-31: 20 mL
  Administered 2024-06-01 (×2): 10 mL
  Administered 2024-06-02: 20 mL
  Administered 2024-06-02 – 2024-06-03 (×2): 10 mL

## 2024-05-31 NOTE — Progress Notes (Signed)
 PROGRESS NOTE  NATAHSA MARIAN [name]:[id number] DOB: 1951/02/13 DOA: 05/30/2024 PCP: McDiarmid, Krystal BIRCH, MD  HPI/Recap of past 24 hours: [name] is a 73 y.o. female with medical history significant for recently diagnosed left breast cancer on neoadjuvant Enhertu , T2DM, HTN, HLD, neuropathy, ADHD, GERD, and osteoarthritis who presented to the ED for evaluation of shortness of breath.  Patient reports sudden onset of shortness of breath that progressed with significant dyspnea on exertion. Patient had a follow-up with her oncologist and due to her elevated heart rate and shortness of breath, she was sent directly to the ED for further evaluation.  In the ED, HR 90s-100s, SpO2 95% on room air.  Labs significant for BNP 370, troponin 103-111, negative flu, RSV and COVID test. CTA chest PE study shows acute segmental and subsegmental pulmonary embolism with right heart strain (RV/LV = 1.4). Patient was started on heparin  drip. TRH was consulted for admission.    Today, patient noted to be intermittently requiring oxygen, denies any chest pain or worsening shortness of breath.  Discussed extensively with patient and daughter at bedside.    Assessment/Plan: Principal Problem:   Acute pulmonary embolism (HCC) Active Problems:   Acute hypoxic respiratory failure (HCC)   Acute pulmonary embolism Submassive PE Acute hypoxic respiratory failure Attempted to wean off oxygen, patient saturating around 89 to 93% on room air, continue supplemental oxygen as needed and attempt to wean once able CT A/P shows segmented and subsegmental pulmonary embolism with right heart strain Echo pending Doppler noted with bilateral DVT in the right posterior tibial vein, left posterior tibial vein and left peroneal vein Continue IV heparin  PCCM on board, appreciate recs Heme-onc recommended IR evaluation for possible thrombolysis/thrombectomy, appreciate IR recs, patient deferred procedure for thrombolysis for  now Telemetry   Elevated troponin Troponins elevated to 103-111, likely demand ischemia in the setting of acute PE Remains chest pain-free Echo pending as above  Hypokalemia Hypomagnesemia Replace as needed  Normocytic anemia Hemoglobin dropped to 7.1 on IV heparin  Noted downward trend since June Anemia panel pending Type and screen Transfuse if hemoglobin less than 7   HTN BP noted to be stable Continue amlodipine , carvedilol  for now, hold HCTZ, and benazepril  pending BP, to avoid hypotension   T2DM Last A1c 7.1% 4 months ago SSI, Accu-Cheks and hypoglycemia protocol  Breast cancer Diagnosed with left breast cancer, estrogen receptor positive in May 2025 Started neoadjuvant Enhertu  2 months ago Patient's oncologist, [name], on board   Chronic pain Neuropathy Continue gabapentin  and as needed Norco  Obesity class II Lifestyle modification advised      Estimated body mass index is 35.28 kg/m as calculated from the following:   Height as of this encounter: 5' 1 (1.549 m).   Weight as of this encounter: 84.7 kg.     Code Status: Full  Family Communication: Daughter at bedside  Disposition Plan: Status is: Inpatient Remains inpatient appropriate because: Level of care      Consultants: PCCM Heme-onc IR  Procedures: None  Antimicrobials: None  DVT prophylaxis: Heparin  drip   Objective: Vitals:   05/31/24 0858 05/31/24 0900 05/31/24 1000 05/31/24 1100  BP: (!) 161/50 (!) 135/58 (!) 121/44 (!) 193/63  Pulse:  89 84 96  Resp:  16 16 18   Temp:    98 F (36.7 C)  TempSrc:    Oral  SpO2:  96% (!) 89% 93%  Weight:      Height:  Intake/Output Summary (Last 24 hours) at 05/31/2024 1601 Last data filed at 05/31/2024 1243 Gross per 24 hour  Intake 269.27 ml  Output --  Net 269.27 ml   Filed Weights   05/30/24 1502 05/30/24 2200  Weight: 85.3 kg 84.7 kg    Exam: General: NAD  Cardiovascular: S1, S2 present Respiratory:  CTAB Abdomen: Soft, nontender, nondistended, bowel sounds present Musculoskeletal: Trace bilateral pedal edema noted Skin: Normal Psychiatry: Normal mood     Data Reviewed: CBC: Recent Labs  Lab 05/30/24 1330 05/30/24 1528 05/31/24 0353  WBC 8.6 9.1 8.0  NEUTROABS 4.1 4.6  --   HGB 8.9* 8.4* 7.1*  HCT 26.7* 26.6* 23.2*  MCV 81.2 84.7 84.7  PLT 500* 465* 398   Basic Metabolic Panel: Recent Labs  Lab 05/30/24 1330 05/30/24 1528 05/31/24 0353  NA 140 138 138  K 3.8 3.5 3.3*  CL 107 108 108  CO2 22 20* 23  GLUCOSE 191* 147* 118*  BUN 13 13 10   CREATININE 1.10* 1.02* 0.82  CALCIUM  8.2* 8.2* 7.8*  MG  --   --  1.4*   GFR: Estimated Creatinine Clearance: 59.5 mL/min (by C-G formula based on SCr of 0.82 mg/dL). Liver Function Tests: Recent Labs  Lab 05/30/24 1330 05/30/24 1528  AST 19 23  ALT 21 23  ALKPHOS 64 55  BILITOT 0.2 0.3  PROT 6.0* 5.7*  ALBUMIN 3.2* 2.4*   No results for input(s): LIPASE, AMYLASE in the last 168 hours. No results for input(s): AMMONIA in the last 168 hours. Coagulation Profile: No results for input(s): INR, PROTIME in the last 168 hours. Cardiac Enzymes: No results for input(s): CKTOTAL, CKMB, CKMBINDEX, TROPONINI in the last 168 hours. BNP (last 3 results) No results for input(s): PROBNP in the last 8760 hours. HbA1C: No results for input(s): HGBA1C in the last 72 hours. CBG: Recent Labs  Lab 05/31/24 0841 05/31/24 1119 05/31/24 1528  GLUCAP 159* 135* 117*   Lipid Profile: No results for input(s): CHOL, HDL, LDLCALC, TRIG, CHOLHDL, LDLDIRECT in the last 72 hours. Thyroid  Function Tests: No results for input(s): TSH, T4TOTAL, FREET4, T3FREE, THYROIDAB in the last 72 hours. Anemia Panel: No results for input(s): VITAMINB12, FOLATE, FERRITIN, TIBC, IRON, RETICCTPCT in the last 72 hours. Urine analysis:    Component Value Date/Time   BILIRUBINUR negative 03/07/2024  1110   KETONESUR negative 03/07/2024 1110   PROTEINUR =100 (A) 03/07/2024 1110   UROBILINOGEN 0.2 03/07/2024 1110   NITRITE Positive (A) 03/07/2024 1110   LEUKOCYTESUR Moderate (2+) (A) 03/07/2024 1110   Sepsis Labs: @LABRCNTIP (procalcitonin:4,lacticidven:4)  ) Recent Results (from the past 240 hours)  Resp panel by RT-PCR (RSV, Flu A&B, Covid) Anterior Nasal Swab     Status: None   Collection Time: 05/30/24  3:32 PM   Specimen: Anterior Nasal Swab  Result Value Ref Range Status   SARS Coronavirus 2 by RT PCR NEGATIVE NEGATIVE Final    Comment: (NOTE) SARS-CoV-2 target nucleic acids are NOT DETECTED.  The SARS-CoV-2 RNA is generally detectable in upper respiratory specimens during the acute phase of infection. The lowest concentration of SARS-CoV-2 viral copies this assay can detect is 138 copies/mL. A negative result does not preclude SARS-Cov-2 infection and should not be used as the sole basis for treatment or other patient management decisions. A negative result may occur with  improper specimen collection/handling, submission of specimen other than nasopharyngeal swab, presence of viral mutation(s) within the areas targeted by this assay, and inadequate number of viral copies(<138  copies/mL). A negative result must be combined with clinical observations, patient history, and epidemiological information. The expected result is Negative.  Fact Sheet for Patients:  BloggerCourse.com  Fact Sheet for Healthcare Providers:  SeriousBroker.it  This test is no t yet approved or cleared by the [place]  FDA and  has been authorized for detection and/or diagnosis of SARS-CoV-2 by FDA under an Emergency Use Authorization (EUA). This EUA will remain  in effect (meaning this test can be used) for the duration of the COVID-19 declaration under Section 564(b)(1) of the Act, 21 U.S.C.section 360bbb-3(b)(1), unless the authorization  is terminated  or revoked sooner.       Influenza A by PCR NEGATIVE NEGATIVE Final   Influenza B by PCR NEGATIVE NEGATIVE Final    Comment: (NOTE) The Xpert Xpress SARS-CoV-2/FLU/RSV plus assay is intended as an aid in the diagnosis of influenza from Nasopharyngeal swab specimens and should not be used as a sole basis for treatment. Nasal washings and aspirates are unacceptable for Xpert Xpress SARS-CoV-2/FLU/RSV testing.  Fact Sheet for Patients: BloggerCourse.com  Fact Sheet for Healthcare Providers: SeriousBroker.it  This test is not yet approved or cleared by the [place]  FDA and has been authorized for detection and/or diagnosis of SARS-CoV-2 by FDA under an Emergency Use Authorization (EUA). This EUA will remain in effect (meaning this test can be used) for the duration of the COVID-19 declaration under Section 564(b)(1) of the Act, 21 U.S.C. section 360bbb-3(b)(1), unless the authorization is terminated or revoked.     Resp Syncytial Virus by PCR NEGATIVE NEGATIVE Final    Comment: (NOTE) Fact Sheet for Patients: BloggerCourse.com  Fact Sheet for Healthcare Providers: SeriousBroker.it  This test is not yet approved or cleared by the [place]  FDA and has been authorized for detection and/or diagnosis of SARS-CoV-2 by FDA under an Emergency Use Authorization (EUA). This EUA will remain in effect (meaning this test can be used) for the duration of the COVID-19 declaration under Section 564(b)(1) of the Act, 21 U.S.C. section 360bbb-3(b)(1), unless the authorization is terminated or revoked.  Performed at [facility], [address] [place], [place] [address]   MRSA Next Gen by PCR, Nasal     Status: None   Collection Time: 05/31/24  3:10 AM   Specimen: Nasal Mucosa; Nasal Swab  Result Value Ref Range Status   MRSA by PCR Next Gen NOT  DETECTED NOT DETECTED Final    Comment: (NOTE) The GeneXpert MRSA Assay (FDA approved for NASAL specimens only), is one component of a comprehensive MRSA colonization surveillance program. It is not intended to diagnose MRSA infection nor to guide or monitor treatment for MRSA infections. Test performance is not FDA approved in patients less than 15 years old. Performed at [facility], [address] [place], [place] [address]       Studies: VAS US  LOWER EXTREMITY VENOUS (DVT) Result Date: 05/31/2024  Lower Venous DVT Study Patient Name:  DERYN MASSENGALE  Date of Exam:   05/31/2024 Medical Rec #: [id number]     Accession #:    [id number] Date of Birth: 09-25-1951     Patient Gender: F Patient Age:   58 years Exam Location:  [facility] Procedure:      VAS US  LOWER EXTREMITY VENOUS (DVT) Referring Phys: PROSPER AMPONSAH --------------------------------------------------------------------------------  Indications: Pulmonary embolism.  Risk Factors: Breast cancer, on chemotherapy. Limitations: Poor ultrasound/tissue interface and calcific shadowing. Comparison Study: No previous exams Performing Technologist: [facility]  RVT, RDMS  Examination Guidelines: A complete evaluation includes B-mode imaging, spectral Doppler, color Doppler, and power Doppler as needed of all accessible portions of each vessel. Bilateral testing is considered an integral part of a complete examination. Limited examinations for reoccurring indications may be performed as noted. The reflux portion of the exam is performed with the patient in reverse Trendelenburg.  +---------+---------------+---------+-----------+----------+-------------------+ RIGHT    CompressibilityPhasicitySpontaneityPropertiesThrombus Aging      +---------+---------------+---------+-----------+----------+-------------------+ CFV      Full           No       Yes                                       +---------+---------------+---------+-----------+----------+-------------------+ SFJ      Full                                                             +---------+---------------+---------+-----------+----------+-------------------+ FV Prox  Full           No       Yes                                      +---------+---------------+---------+-----------+----------+-------------------+ FV Mid   Full           No       Yes                                      +---------+---------------+---------+-----------+----------+-------------------+ FV DistalFull           No       Yes                                      +---------+---------------+---------+-----------+----------+-------------------+ PFV      Full                                         Not well visualized +---------+---------------+---------+-----------+----------+-------------------+ POP      Full           No       Yes                                      +---------+---------------+---------+-----------+----------+-------------------+ PTV      Partial        No       No                   Age Indeterminate   +---------+---------------+---------+-----------+----------+-------------------+ PERO     Full                                                             +---------+---------------+---------+-----------+----------+-------------------+  Pulsatile doppler waveforms seen in lower extemity  +---------+---------------+---------+-----------+----------+-------------------+ LEFT     CompressibilityPhasicitySpontaneityPropertiesThrombus Aging      +---------+---------------+---------+-----------+----------+-------------------+ CFV      Full           No       Yes                                      +---------+---------------+---------+-----------+----------+-------------------+ SFJ      Full                                                              +---------+---------------+---------+-----------+----------+-------------------+ FV Prox  Full           Yes      Yes                                      +---------+---------------+---------+-----------+----------+-------------------+ FV Mid   Full           Yes      Yes                                      +---------+---------------+---------+-----------+----------+-------------------+ FV DistalFull           Yes      Yes                                      +---------+---------------+---------+-----------+----------+-------------------+ PFV      Full                                                             +---------+---------------+---------+-----------+----------+-------------------+ POP      Full           Yes      Yes                                      +---------+---------------+---------+-----------+----------+-------------------+ PTV      Partial        No       No                   age indeterminate -                                                       one of paired       +---------+---------------+---------+-----------+----------+-------------------+ PERO     None           No       No  Age Indeterminate   +---------+---------------+---------+-----------+----------+-------------------+     Summary: RIGHT: - Findings consistent with age indeterminate deep vein thrombosis involving the right posterior tibial veins.   LEFT: - Findings consistent with age indeterminate deep vein thrombosis involving the left posterior tibial veins, and left peroneal veins.   *See table(s) above for measurements and observations. Electronically signed by Norman Serve on 05/31/2024 at 2:58:28 PM.    Final    CT Angio Chest PE W and/or Wo Contrast Result Date: 05/30/2024 CLINICAL DATA:  Tachycardia and chest pain EXAM: CT ANGIOGRAPHY CHEST WITH CONTRAST TECHNIQUE: Multidetector CT imaging of the chest was performed using the standard protocol  during bolus administration of intravenous contrast. Multiplanar CT image reconstructions and MIPs were obtained to evaluate the vascular anatomy. RADIATION DOSE REDUCTION: This exam was performed according to the departmental dose-optimization program which includes automated exposure control, adjustment of the mA and/or kV according to patient size and/or use of iterative reconstruction technique. CONTRAST:  75mL OMNIPAQUE  IOHEXOL  350 MG/ML SOLN COMPARISON:  Chest x-ray from earlier in the same day FINDINGS: Cardiovascular: Atherosclerotic calcifications of the thoracic aorta are noted. No aneurysmal dilatation or dissection is seen. The heart is mildly enlarged. Coronary calcifications are noted. The pulmonary artery shows a normal branching pattern bilaterally. Scattered filling defects are noted bilaterally in segmental and subsegmental pulmonary arterial branches. RV/LV ratio of 1.4 is noted consistent with right heart strain. Mediastinum/Nodes: Thoracic inlet is within normal limits. No hilar or mediastinal adenopathy is noted. The esophagus as visualized is within normal limits. Lungs/Pleura: Mosaic attenuation is noted throughout both lungs consistent with patchy air trapping. Mild atelectatic changes are noted in the left posterior costophrenic angle. No sizable parenchymal nodule is seen. Upper Abdomen: No acute abnormality. Musculoskeletal: Degenerative changes of the thoracic spine are noted. No acute rib abnormality is noted. Review of the MIP images confirms the above findings. IMPRESSION: Positive for acute PE with CT evidence of right heart strain (RV/LV Ratio = 1.4) consistent with at least submassive (intermediate risk) PE. The presence of right heart strain has been associated with an increased risk of morbidity and mortality. Please refer to the Code PE Focused order set in EPIC. No other focal abnormality is noted. Aortic Atherosclerosis (ICD10-I70.0). Critical Value/emergent results were  called by telephone at the time of interpretation on 05/30/2024 at 7:13 pm to Dr. SID BONING , who verbally acknowledged these results. Electronically Signed   By: Oneil Devonshire M.D.   On: 05/30/2024 19:16    Scheduled Meds:  amLODipine   10 mg Oral Daily   carvedilol   6.25 mg Oral BID WC   Chlorhexidine  Gluconate Cloth  6 each Topical Daily   feeding supplement  237 mL Oral BID BM   insulin  aspart  0-15 Units Subcutaneous TID WC   leptospermum manuka honey  1 Application Topical Daily   prenatal multivitamin  1 tablet Oral Daily   sodium chloride  flush  10-40 mL Intracatheter Q12H   sodium chloride  flush  3 mL Intravenous Q12H    Continuous Infusions:  sodium chloride      [START ON 06/01/2024] sodium chloride      [START ON 06/01/2024] sodium chloride      alteplase  (tPA/ACTIVASE ) 12 mg in sodium chloride  0.9 % 238 mL Stopped (05/31/24 1431)   alteplase  (tPA/ACTIVASE ) 12 mg in sodium chloride  0.9 % 238 mL Stopped (05/31/24 1431)   heparin  1,200 Units/hr (05/31/24 1243)     LOS: 1 day     Lebron JINNY Cage, MD [facility]  If 7PM-7AM, please contact night-coverage [email or web address] 05/31/2024, 4:01 PM

## 2024-05-31 NOTE — Progress Notes (Signed)
 PROGRESS NOTE  NATAHSA [name] FMW:[id number] DOB: 1951/02/13 DOA: 05/30/2024 PCP: McDiarmid, Krystal BIRCH, MD  HPI/Recap of past 24 hours: [name] is a 73 y.o. female with medical history significant for recently diagnosed left breast cancer on neoadjuvant Enhertu , T2DM, HTN, HLD, neuropathy, ADHD, GERD, and osteoarthritis who presented to the ED for evaluation of shortness of breath.  Patient reports sudden onset of shortness of breath that progressed with significant dyspnea on exertion. Patient had a follow-up with her oncologist and due to her elevated heart rate and shortness of breath, she was sent directly to the ED for further evaluation.  In the ED, HR 90s-100s, SpO2 95% on room air.  Labs significant for BNP 370, troponin 103-111, negative flu, RSV and COVID test. CTA chest PE study shows acute segmental and subsegmental pulmonary embolism with right heart strain (RV/LV = 1.4). Patient was started on heparin  drip. TRH was consulted for admission.    Today, patient noted to be intermittently requiring oxygen, denies any chest pain or worsening shortness of breath.  Discussed extensively with patient and daughter at bedside.    Assessment/Plan: Principal Problem:   Acute pulmonary embolism (HCC) Active Problems:   Acute hypoxic respiratory failure (HCC)   Acute pulmonary embolism Submassive PE Acute hypoxic respiratory failure Attempted to wean off oxygen, patient saturating around 89 to 93% on room air, continue supplemental oxygen as needed and attempt to wean once able CT A/P shows segmented and subsegmental pulmonary embolism with right heart strain Echo pending Doppler noted with bilateral DVT in the right posterior tibial vein, left posterior tibial vein and left peroneal vein Continue IV heparin  PCCM on board, appreciate recs Heme-onc recommended IR evaluation for possible thrombolysis/thrombectomy, appreciate IR recs, patient deferred procedure for thrombolysis for  now Telemetry   Elevated troponin Troponins elevated to 103-111, likely demand ischemia in the setting of acute PE Remains chest pain-free Echo pending as above  Hypokalemia Hypomagnesemia Replace as needed  Normocytic anemia Hemoglobin dropped to 7.1 on IV heparin  Noted downward trend since June Anemia panel pending Type and screen Transfuse if hemoglobin less than 7   HTN BP noted to be stable Continue amlodipine , carvedilol  for now, hold HCTZ, and benazepril  pending BP, to avoid hypotension   T2DM Last A1c 7.1% 4 months ago SSI, Accu-Cheks and hypoglycemia protocol  Breast cancer Diagnosed with left breast cancer, estrogen receptor positive in May 2025 Started neoadjuvant Enhertu  2 months ago Patient's oncologist, [name], on board   Chronic pain Neuropathy Continue gabapentin  and as needed Norco  Obesity class II Lifestyle modification advised      Estimated body mass index is 35.28 kg/m as calculated from the following:   Height as of this encounter: 5' 1 (1.549 m).   Weight as of this encounter: 84.7 kg.     Code Status: Full  Family Communication: Daughter at bedside  Disposition Plan: Status is: Inpatient Remains inpatient appropriate because: Level of care      Consultants: PCCM Heme-onc IR  Procedures: None  Antimicrobials: None  DVT prophylaxis: Heparin  drip   Objective: Vitals:   05/31/24 0858 05/31/24 0900 05/31/24 1000 05/31/24 1100  BP: (!) 161/50 (!) 135/58 (!) 121/44 (!) 193/63  Pulse:  89 84 96  Resp:  16 16 18   Temp:    98 F (36.7 C)  TempSrc:    Oral  SpO2:  96% (!) 89% 93%  Weight:      Height:  Intake/Output Summary (Last 24 hours) at 05/31/2024 1601 Last data filed at 05/31/2024 1243 Gross per 24 hour  Intake 269.27 ml  Output --  Net 269.27 ml   Filed Weights   05/30/24 1502 05/30/24 2200  Weight: 85.3 kg 84.7 kg    Exam: General: NAD  Cardiovascular: S1, S2 present Respiratory:  CTAB Abdomen: Soft, nontender, nondistended, bowel sounds present Musculoskeletal: Trace bilateral pedal edema noted Skin: Normal Psychiatry: Normal mood     Data Reviewed: CBC: Recent Labs  Lab 05/30/24 1330 05/30/24 1528 05/31/24 0353  WBC 8.6 9.1 8.0  NEUTROABS 4.1 4.6  --   HGB 8.9* 8.4* 7.1*  HCT 26.7* 26.6* 23.2*  MCV 81.2 84.7 84.7  PLT 500* 465* 398   Basic Metabolic Panel: Recent Labs  Lab 05/30/24 1330 05/30/24 1528 05/31/24 0353  NA 140 138 138  K 3.8 3.5 3.3*  CL 107 108 108  CO2 22 20* 23  GLUCOSE 191* 147* 118*  BUN 13 13 10   CREATININE 1.10* 1.02* 0.82  CALCIUM  8.2* 8.2* 7.8*  MG  --   --  1.4*   GFR: Estimated Creatinine Clearance: 59.5 mL/min (by C-G formula based on SCr of 0.82 mg/dL). Liver Function Tests: Recent Labs  Lab 05/30/24 1330 05/30/24 1528  AST 19 23  ALT 21 23  ALKPHOS 64 55  BILITOT 0.2 0.3  PROT 6.0* 5.7*  ALBUMIN 3.2* 2.4*   No results for input(s): LIPASE, AMYLASE in the last 168 hours. No results for input(s): AMMONIA in the last 168 hours. Coagulation Profile: No results for input(s): INR, PROTIME in the last 168 hours. Cardiac Enzymes: No results for input(s): CKTOTAL, CKMB, CKMBINDEX, TROPONINI in the last 168 hours. BNP (last 3 results) No results for input(s): PROBNP in the last 8760 hours. HbA1C: No results for input(s): HGBA1C in the last 72 hours. CBG: Recent Labs  Lab 05/31/24 0841 05/31/24 1119 05/31/24 1528  GLUCAP 159* 135* 117*   Lipid Profile: No results for input(s): CHOL, HDL, LDLCALC, TRIG, CHOLHDL, LDLDIRECT in the last 72 hours. Thyroid  Function Tests: No results for input(s): TSH, T4TOTAL, FREET4, T3FREE, THYROIDAB in the last 72 hours. Anemia Panel: No results for input(s): VITAMINB12, FOLATE, FERRITIN, TIBC, IRON, RETICCTPCT in the last 72 hours. Urine analysis:    Component Value Date/Time   BILIRUBINUR negative 03/07/2024  1110   KETONESUR negative 03/07/2024 1110   PROTEINUR =100 (A) 03/07/2024 1110   UROBILINOGEN 0.2 03/07/2024 1110   NITRITE Positive (A) 03/07/2024 1110   LEUKOCYTESUR Moderate (2+) (A) 03/07/2024 1110   Sepsis Labs: @LABRCNTIP (procalcitonin:4,lacticidven:4)  ) Recent Results (from the past 240 hours)  Resp panel by RT-PCR (RSV, Flu A&B, Covid) Anterior Nasal Swab     Status: None   Collection Time: 05/30/24  3:32 PM   Specimen: Anterior Nasal Swab  Result Value Ref Range Status   SARS Coronavirus 2 by RT PCR NEGATIVE NEGATIVE Final    Comment: (NOTE) SARS-CoV-2 target nucleic acids are NOT DETECTED.  The SARS-CoV-2 RNA is generally detectable in upper respiratory specimens during the acute phase of infection. The lowest concentration of SARS-CoV-2 viral copies this assay can detect is 138 copies/mL. A negative result does not preclude SARS-Cov-2 infection and should not be used as the sole basis for treatment or other patient management decisions. A negative result may occur with  improper specimen collection/handling, submission of specimen other than nasopharyngeal swab, presence of viral mutation(s) within the areas targeted by this assay, and inadequate number of viral copies(<138  copies/mL). A negative result must be combined with clinical observations, patient history, and epidemiological information. The expected result is Negative.  Fact Sheet for Patients:  BloggerCourse.com  Fact Sheet for Healthcare Providers:  SeriousBroker.it  This test is no t yet approved or cleared by the [place]  FDA and  has been authorized for detection and/or diagnosis of SARS-CoV-2 by FDA under an Emergency Use Authorization (EUA). This EUA will remain  in effect (meaning this test can be used) for the duration of the COVID-19 declaration under Section 564(b)(1) of the Act, 21 U.S.C.section 360bbb-3(b)(1), unless the authorization  is terminated  or revoked sooner.       Influenza A by PCR NEGATIVE NEGATIVE Final   Influenza B by PCR NEGATIVE NEGATIVE Final    Comment: (NOTE) The Xpert Xpress SARS-CoV-2/FLU/RSV plus assay is intended as an aid in the diagnosis of influenza from Nasopharyngeal swab specimens and should not be used as a sole basis for treatment. Nasal washings and aspirates are unacceptable for Xpert Xpress SARS-CoV-2/FLU/RSV testing.  Fact Sheet for Patients: BloggerCourse.com  Fact Sheet for Healthcare Providers: SeriousBroker.it  This test is not yet approved or cleared by the [place]  FDA and has been authorized for detection and/or diagnosis of SARS-CoV-2 by FDA under an Emergency Use Authorization (EUA). This EUA will remain in effect (meaning this test can be used) for the duration of the COVID-19 declaration under Section 564(b)(1) of the Act, 21 U.S.C. section 360bbb-3(b)(1), unless the authorization is terminated or revoked.     Resp Syncytial Virus by PCR NEGATIVE NEGATIVE Final    Comment: (NOTE) Fact Sheet for Patients: BloggerCourse.com  Fact Sheet for Healthcare Providers: SeriousBroker.it  This test is not yet approved or cleared by the [place]  FDA and has been authorized for detection and/or diagnosis of SARS-CoV-2 by FDA under an Emergency Use Authorization (EUA). This EUA will remain in effect (meaning this test can be used) for the duration of the COVID-19 declaration under Section 564(b)(1) of the Act, 21 U.S.C. section 360bbb-3(b)(1), unless the authorization is terminated or revoked.  Performed at [facility], [address] [place], [place] [address]   MRSA Next Gen by PCR, Nasal     Status: None   Collection Time: 05/31/24  3:10 AM   Specimen: Nasal Mucosa; Nasal Swab  Result Value Ref Range Status   MRSA by PCR Next Gen NOT  DETECTED NOT DETECTED Final    Comment: (NOTE) The GeneXpert MRSA Assay (FDA approved for NASAL specimens only), is one component of a comprehensive MRSA colonization surveillance program. It is not intended to diagnose MRSA infection nor to guide or monitor treatment for MRSA infections. Test performance is not FDA approved in patients less than 15 years old. Performed at [facility], [address] [place], [place] [address]       Studies: VAS US  LOWER EXTREMITY VENOUS (DVT) Result Date: 05/31/2024  Lower Venous DVT Study Patient Name:  DERYN MASSENGALE  Date of Exam:   05/31/2024 Medical Rec #: [id number]     Accession #:    [id number] Date of Birth: 09-25-1951     Patient Gender: F Patient Age:   58 years Exam Location:  [facility] Procedure:      VAS US  LOWER EXTREMITY VENOUS (DVT) Referring Phys: PROSPER AMPONSAH --------------------------------------------------------------------------------  Indications: Pulmonary embolism.  Risk Factors: Breast cancer, on chemotherapy. Limitations: Poor ultrasound/tissue interface and calcific shadowing. Comparison Study: No previous exams Performing Technologist: [facility]  RVT, RDMS  Examination Guidelines: A complete evaluation includes B-mode imaging, spectral Doppler, color Doppler, and power Doppler as needed of all accessible portions of each vessel. Bilateral testing is considered an integral part of a complete examination. Limited examinations for reoccurring indications may be performed as noted. The reflux portion of the exam is performed with the patient in reverse Trendelenburg.  +---------+---------------+---------+-----------+----------+-------------------+ RIGHT    CompressibilityPhasicitySpontaneityPropertiesThrombus Aging      +---------+---------------+---------+-----------+----------+-------------------+ CFV      Full           No       Yes                                       +---------+---------------+---------+-----------+----------+-------------------+ SFJ      Full                                                             +---------+---------------+---------+-----------+----------+-------------------+ FV Prox  Full           No       Yes                                      +---------+---------------+---------+-----------+----------+-------------------+ FV Mid   Full           No       Yes                                      +---------+---------------+---------+-----------+----------+-------------------+ FV DistalFull           No       Yes                                      +---------+---------------+---------+-----------+----------+-------------------+ PFV      Full                                         Not well visualized +---------+---------------+---------+-----------+----------+-------------------+ POP      Full           No       Yes                                      +---------+---------------+---------+-----------+----------+-------------------+ PTV      Partial        No       No                   Age Indeterminate   +---------+---------------+---------+-----------+----------+-------------------+ PERO     Full                                                             +---------+---------------+---------+-----------+----------+-------------------+  Pulsatile doppler waveforms seen in lower extemity  +---------+---------------+---------+-----------+----------+-------------------+ LEFT     CompressibilityPhasicitySpontaneityPropertiesThrombus Aging      +---------+---------------+---------+-----------+----------+-------------------+ CFV      Full           No       Yes                                      +---------+---------------+---------+-----------+----------+-------------------+ SFJ      Full                                                              +---------+---------------+---------+-----------+----------+-------------------+ FV Prox  Full           Yes      Yes                                      +---------+---------------+---------+-----------+----------+-------------------+ FV Mid   Full           Yes      Yes                                      +---------+---------------+---------+-----------+----------+-------------------+ FV DistalFull           Yes      Yes                                      +---------+---------------+---------+-----------+----------+-------------------+ PFV      Full                                                             +---------+---------------+---------+-----------+----------+-------------------+ POP      Full           Yes      Yes                                      +---------+---------------+---------+-----------+----------+-------------------+ PTV      Partial        No       No                   age indeterminate -                                                       one of paired       +---------+---------------+---------+-----------+----------+-------------------+ PERO     None           No       No  Age Indeterminate   +---------+---------------+---------+-----------+----------+-------------------+     Summary: RIGHT: - Findings consistent with age indeterminate deep vein thrombosis involving the right posterior tibial veins.   LEFT: - Findings consistent with age indeterminate deep vein thrombosis involving the left posterior tibial veins, and left peroneal veins.   *See table(s) above for measurements and observations. Electronically signed by Norman Serve on 05/31/2024 at 2:58:28 PM.    Final    CT Angio Chest PE W and/or Wo Contrast Result Date: 05/30/2024 CLINICAL DATA:  Tachycardia and chest pain EXAM: CT ANGIOGRAPHY CHEST WITH CONTRAST TECHNIQUE: Multidetector CT imaging of the chest was performed using the standard protocol  during bolus administration of intravenous contrast. Multiplanar CT image reconstructions and MIPs were obtained to evaluate the vascular anatomy. RADIATION DOSE REDUCTION: This exam was performed according to the departmental dose-optimization program which includes automated exposure control, adjustment of the mA and/or kV according to patient size and/or use of iterative reconstruction technique. CONTRAST:  75mL OMNIPAQUE  IOHEXOL  350 MG/ML SOLN COMPARISON:  Chest x-ray from earlier in the same day FINDINGS: Cardiovascular: Atherosclerotic calcifications of the thoracic aorta are noted. No aneurysmal dilatation or dissection is seen. The heart is mildly enlarged. Coronary calcifications are noted. The pulmonary artery shows a normal branching pattern bilaterally. Scattered filling defects are noted bilaterally in segmental and subsegmental pulmonary arterial branches. RV/LV ratio of 1.4 is noted consistent with right heart strain. Mediastinum/Nodes: Thoracic inlet is within normal limits. No hilar or mediastinal adenopathy is noted. The esophagus as visualized is within normal limits. Lungs/Pleura: Mosaic attenuation is noted throughout both lungs consistent with patchy air trapping. Mild atelectatic changes are noted in the left posterior costophrenic angle. No sizable parenchymal nodule is seen. Upper Abdomen: No acute abnormality. Musculoskeletal: Degenerative changes of the thoracic spine are noted. No acute rib abnormality is noted. Review of the MIP images confirms the above findings. IMPRESSION: Positive for acute PE with CT evidence of right heart strain (RV/LV Ratio = 1.4) consistent with at least submassive (intermediate risk) PE. The presence of right heart strain has been associated with an increased risk of morbidity and mortality. Please refer to the Code PE Focused order set in EPIC. No other focal abnormality is noted. Aortic Atherosclerosis (ICD10-I70.0). Critical Value/emergent results were  called by telephone at the time of interpretation on 05/30/2024 at 7:13 pm to Dr. SID BONING , who verbally acknowledged these results. Electronically Signed   By: Oneil Devonshire M.D.   On: 05/30/2024 19:16    Scheduled Meds:  amLODipine   10 mg Oral Daily   carvedilol   6.25 mg Oral BID WC   Chlorhexidine  Gluconate Cloth  6 each Topical Daily   feeding supplement  237 mL Oral BID BM   insulin  aspart  0-15 Units Subcutaneous TID WC   leptospermum manuka honey  1 Application Topical Daily   prenatal multivitamin  1 tablet Oral Daily   sodium chloride  flush  10-40 mL Intracatheter Q12H   sodium chloride  flush  3 mL Intravenous Q12H    Continuous Infusions:  sodium chloride      [START ON 06/01/2024] sodium chloride      [START ON 06/01/2024] sodium chloride      alteplase  (tPA/ACTIVASE ) 12 mg in sodium chloride  0.9 % 238 mL Stopped (05/31/24 1431)   alteplase  (tPA/ACTIVASE ) 12 mg in sodium chloride  0.9 % 238 mL Stopped (05/31/24 1431)   heparin  1,200 Units/hr (05/31/24 1243)     LOS: 1 day     Lebron JINNY Cage, MD [facility]  If 7PM-7AM, please contact night-coverage [email or web address] 05/31/2024, 4:01 PM

## 2024-05-31 NOTE — Progress Notes (Signed)
 PHARMACY - ANTICOAGULATION CONSULT NOTE  Pharmacy Consult for heparin  Indication: pulmonary embolus  Allergies  Allergen Reactions   Januvia  [Sitagliptin ] Other (See Comments)    Epigastric abdominal pain   Jardiance  [Empagliflozin ] Other (See Comments)    Lightheadedness, near-syncope, extreme yeast problems   Celecoxib Palpitations   Diclofenac-Misoprostol Palpitations and Other (See Comments)    Arthrotec    Patient Measurements: Height: 5' 1 (154.9 cm) Weight: 84.7 kg (186 lb 11.7 oz) IBW/kg (Calculated) : 47.8 HEPARIN  DW (KG): 67.2  Vital Signs: Temp: 98.4 F (36.9 C) (08/13 1600) Temp Source: Oral (08/13 1600) BP: 120/40 (08/13 1500) Pulse Rate: 101 (08/13 1600)  Labs: Recent Labs    05/30/24 1330 05/30/24 1528 05/30/24 1528 05/30/24 1742 05/31/24 0117 05/31/24 0353 05/31/24 1525  HGB 8.9* 8.4*  --   --   --  7.1*  --   HCT 26.7* 26.6*  --   --   --  23.2*  --   PLT 500* 465*  --   --   --  398  --   HEPARINUNFRC  --   --   --   --   --  0.29* 0.42  CREATININE 1.10* 1.02*  --   --   --  0.82  --   TROPONINIHS  --  103*   < > 111* 93* 77*  --    < > = values in this interval not displayed.    Estimated Creatinine Clearance: 59.5 mL/min (by C-G formula based on SCr of 0.82 mg/dL).   Medical History: Past Medical History:  Diagnosis Date   Acute MEE (middle ear effusion) 06/07/2014   Acute recurrent sinusitis 12/21/2014   Acute sinusitis 12/21/2014   Allergy    Anemia    hx   Asthma, intermittent 11/02/2018   ATTENTION DEFICIT, W/O HYPERACTIVITY 12/16/2006   Bilateral carpal tunnel syndrome 11/20/2016   Cataract    Cervical spondylosis with myelopathy, History of 04/28/2012   COLON POLYP 12/16/2006   (02/03/12) Surgical [Pathology], sigmoid colon, polyp HYPERPLASTIC POLYP(S) AND POLYPOID FRAGMETNS OF BENIGN COLONIC MUCOSA WITH INTRAMUCOSAL LYMPHOID AGGREGATES. NO ADENOMATOUS CHANGE OR MALIGNANCY IDENTIFIED.    DIABETES MELLITUS II, UNCOMPLICATED  12/16/2006   Fall 10/18/2020   GERD (gastroesophageal reflux disease)    H/O abnormal mammogram 07/19/2009   S/P Breast Biopsy [facility], [place], 07/2009): Benign findings.     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/20/2016   OSTEOARTHRITIS, MULTI SITES 12/16/2006   Perennial allergic rhinitis with seasonal variation 12/16/2006        PONV (postoperative nausea and vomiting)    last surgery only   Recurrent maxillary sinusitis    SACROILIITIS, HISTORY OF 01/08/2009   Qualifier: History of  By: McDiarmid MD, Todd     Seasonal allergies    Seborrheic keratosis 01/15/2016   SKELETAL HYPEROSTOSIS 03/29/2008   Ulcer    Uterine fibroid 02/02/2011  TVUS: 4cm fibroid w/ submucosal component, bil.hydrosalpinges, unable measurable endometrium - 08/18/2004     Medications:  No anticoagulants PTA  Assessment: 73 yo F with acute PE with right heart strain. Pharmacy consulted to manage heparin .  SCr 1.02 Hg 8.4, PLT 465  05/31/24 03:53 Heparin  level = 0.29 (slightly subtherapeutic) with heparin  infusing at 1100 units/hr and rate increased Hgb = 7.1 (dec), Pltc 398k  This afternnoon,  15:25 Heparin  level=0.42, therapeutic, with heparin  infusing at 1200 units/hr No bleeding or infusion related issues per nurse   Goal of Therapy:  Heparin  level 0.3-0.7 units/ml Monitor platelets by anticoagulation protocol: Yes   Plan:   Continue IV Heparin  infusion at 1200 units/hr 8 hour confirmatory Heparin  level Daily CBC & heparin  level F/u for transition to DOAC   Thank you for allowing pharmacy to be a part of this patient's care.  Eleanor EMERSON Agent, PharmD, BCPS Clinical Pharmacist [place] 05/31/2024 5:04 PM

## 2024-05-31 NOTE — Progress Notes (Signed)
 [name]   DOB:1950/11/02   FM#:[id number]   RDW#:[id number]  Medical oncology follow-up note  Subjective: Patient is well-known to me, under my care for her breast cancer.  She is on chemotherapy Enhertu .  She presented with dyspnea and generalized weakness since last Saturday.  I sent her from my office to ED yesterday, and a CTA of chest showed at least submassive PE.  He is on heparin  drip, her symptom has not improved much.   Objective:  Vitals:   05/31/24 0600 05/31/24 0700  BP:    Pulse: (!) 104 85  Resp: (!) 23 16  Temp:    SpO2: 95% 93%    Body mass index is 35.28 kg/m.  Intake/Output Summary (Last 24 hours) at 05/31/2024 0754 Last data filed at 05/31/2024 9373 Gross per 24 hour  Intake 134.2 ml  Output --  Net 134.2 ml     Sclerae unicteric  Oropharynx clear  No peripheral adenopathy  Lungs clear -- no rales or rhonchi  Heart regular rate and rhythm  Abdomen benign  MSK no focal spinal tenderness, no peripheral edema  Neuro nonfocal    CBG (last 3)  No results for input(s): GLUCAP in the last 72 hours.   Labs:  Lab Results  Component Value Date   WBC 8.0 05/31/2024   HGB 7.1 (L) 05/31/2024   HCT 23.2 (L) 05/31/2024   MCV 84.7 05/31/2024   PLT 398 05/31/2024   NEUTROABS 4.6 05/30/2024    Urine Studies No results for input(s): UHGB, CRYS in the last 72 hours.  Invalid input(s): UACOL, UAPR, USPG, UPH, UTP, UGL, UKET, UBIL, UNIT, UROB, ULEU, UEPI, UWBC, URBC, UBAC, CAST, UCOM, [place]  Basic Metabolic Panel: Recent Labs  Lab 05/30/24 1330 05/30/24 1528 05/31/24 0353  NA 140 138 138  K 3.8 3.5 3.3*  CL 107 108 108  CO2 22 20* 23  GLUCOSE 191* 147* 118*  BUN 13 13 10   CREATININE 1.10* 1.02* 0.82  CALCIUM  8.2* 8.2* 7.8*   GFR Estimated Creatinine Clearance: 59.5 mL/min (by C-G formula based on SCr of 0.82 mg/dL). Liver Function Tests: Recent Labs  Lab 05/30/24 1330 05/30/24 1528  AST 19 23  ALT  21 23  ALKPHOS 64 55  BILITOT 0.2 0.3  PROT 6.0* 5.7*  ALBUMIN 3.2* 2.4*   No results for input(s): LIPASE, AMYLASE in the last 168 hours. No results for input(s): AMMONIA in the last 168 hours. Coagulation profile No results for input(s): INR, PROTIME in the last 168 hours.  CBC: Recent Labs  Lab 05/30/24 1330 05/30/24 1528 05/31/24 0353  WBC 8.6 9.1 8.0  NEUTROABS 4.1 4.6  --   HGB 8.9* 8.4* 7.1*  HCT 26.7* 26.6* 23.2*  MCV 81.2 84.7 84.7  PLT 500* 465* 398   Cardiac Enzymes: No results for input(s): CKTOTAL, CKMB, CKMBINDEX, TROPONINI in the last 168 hours. BNP: Invalid input(s): POCBNP CBG: No results for input(s): GLUCAP in the last 168 hours. D-Dimer No results for input(s): DDIMER in the last 72 hours. Hgb A1c No results for input(s): HGBA1C in the last 72 hours. Lipid Profile No results for input(s): CHOL, HDL, LDLCALC, TRIG, CHOLHDL, LDLDIRECT in the last 72 hours. Thyroid  function studies No results for input(s): TSH, T4TOTAL, T3FREE, THYROIDAB in the last 72 hours.  Invalid input(s): FREET3 Anemia work up No results for input(s): VITAMINB12, FOLATE, FERRITIN, TIBC, IRON, RETICCTPCT in the last 72 hours. Microbiology Recent Results (from the past 240 hours)  Resp panel by RT-PCR (  RSV, Flu A&B, Covid) Anterior Nasal Swab     Status: None   Collection Time: 05/30/24  3:32 PM   Specimen: Anterior Nasal Swab  Result Value Ref Range Status   SARS Coronavirus 2 by RT PCR NEGATIVE NEGATIVE Final    Comment: (NOTE) SARS-CoV-2 target nucleic acids are NOT DETECTED.  The SARS-CoV-2 RNA is generally detectable in upper respiratory specimens during the acute phase of infection. The lowest concentration of SARS-CoV-2 viral copies this assay can detect is 138 copies/mL. A negative result does not preclude SARS-Cov-2 infection and should not be used as the sole basis for treatment or other patient  management decisions. A negative result may occur with  improper specimen collection/handling, submission of specimen other than nasopharyngeal swab, presence of viral mutation(s) within the areas targeted by this assay, and inadequate number of viral copies(<138 copies/mL). A negative result must be combined with clinical observations, patient history, and epidemiological information. The expected result is Negative.  Fact Sheet for Patients:  BloggerCourse.com  Fact Sheet for Healthcare Providers:  SeriousBroker.it  This test is no t yet approved or cleared by the [place]  FDA and  has been authorized for detection and/or diagnosis of SARS-CoV-2 by FDA under an Emergency Use Authorization (EUA). This EUA will remain  in effect (meaning this test can be used) for the duration of the COVID-19 declaration under Section 564(b)(1) of the Act, 21 U.S.C.section 360bbb-3(b)(1), unless the authorization is terminated  or revoked sooner.       Influenza A by PCR NEGATIVE NEGATIVE Final   Influenza B by PCR NEGATIVE NEGATIVE Final    Comment: (NOTE) The Xpert Xpress SARS-CoV-2/FLU/RSV plus assay is intended as an aid in the diagnosis of influenza from Nasopharyngeal swab specimens and should not be used as a sole basis for treatment. Nasal washings and aspirates are unacceptable for Xpert Xpress SARS-CoV-2/FLU/RSV testing.  Fact Sheet for Patients: BloggerCourse.com  Fact Sheet for Healthcare Providers: SeriousBroker.it  This test is not yet approved or cleared by the [place]  FDA and has been authorized for detection and/or diagnosis of SARS-CoV-2 by FDA under an Emergency Use Authorization (EUA). This EUA will remain in effect (meaning this test can be used) for the duration of the COVID-19 declaration under Section 564(b)(1) of the Act, 21 U.S.C. section 360bbb-3(b)(1),  unless the authorization is terminated or revoked.     Resp Syncytial Virus by PCR NEGATIVE NEGATIVE Final    Comment: (NOTE) Fact Sheet for Patients: BloggerCourse.com  Fact Sheet for Healthcare Providers: SeriousBroker.it  This test is not yet approved or cleared by the [place]  FDA and has been authorized for detection and/or diagnosis of SARS-CoV-2 by FDA under an Emergency Use Authorization (EUA). This EUA will remain in effect (meaning this test can be used) for the duration of the COVID-19 declaration under Section 564(b)(1) of the Act, 21 U.S.C. section 360bbb-3(b)(1), unless the authorization is terminated or revoked.  Performed at [facility], [address], [place], [place] [address]   MRSA Next Gen by PCR, Nasal     Status: None   Collection Time: 05/31/24  3:10 AM   Specimen: Nasal Mucosa; Nasal Swab  Result Value Ref Range Status   MRSA by PCR Next Gen NOT DETECTED NOT DETECTED Final    Comment: (NOTE) The GeneXpert MRSA Assay (FDA approved for NASAL specimens only), is one component of a comprehensive MRSA colonization surveillance program. It is not intended to diagnose MRSA infection nor to guide or monitor  treatment for MRSA infections. Test performance is not FDA approved in patients less than 26 years old. Performed at [facility], [address] [place], [place] [address]       Studies:  CT Angio Chest PE W and/or Wo Contrast Result Date: 05/30/2024 CLINICAL DATA:  Tachycardia and chest pain EXAM: CT ANGIOGRAPHY CHEST WITH CONTRAST TECHNIQUE: Multidetector CT imaging of the chest was performed using the standard protocol during bolus administration of intravenous contrast. Multiplanar CT image reconstructions and MIPs were obtained to evaluate the vascular anatomy. RADIATION DOSE REDUCTION: This exam was performed according to the departmental  dose-optimization program which includes automated exposure control, adjustment of the mA and/or kV according to patient size and/or use of iterative reconstruction technique. CONTRAST:  75mL OMNIPAQUE  IOHEXOL  350 MG/ML SOLN COMPARISON:  Chest x-ray from earlier in the same day FINDINGS: Cardiovascular: Atherosclerotic calcifications of the thoracic aorta are noted. No aneurysmal dilatation or dissection is seen. The heart is mildly enlarged. Coronary calcifications are noted. The pulmonary artery shows a normal branching pattern bilaterally. Scattered filling defects are noted bilaterally in segmental and subsegmental pulmonary arterial branches. RV/LV ratio of 1.4 is noted consistent with right heart strain. Mediastinum/Nodes: Thoracic inlet is within normal limits. No hilar or mediastinal adenopathy is noted. The esophagus as visualized is within normal limits. Lungs/Pleura: Mosaic attenuation is noted throughout both lungs consistent with patchy air trapping. Mild atelectatic changes are noted in the left posterior costophrenic angle. No sizable parenchymal nodule is seen. Upper Abdomen: No acute abnormality. Musculoskeletal: Degenerative changes of the thoracic spine are noted. No acute rib abnormality is noted. Review of the MIP images confirms the above findings. IMPRESSION: Positive for acute PE with CT evidence of right heart strain (RV/LV Ratio = 1.4) consistent with at least submassive (intermediate risk) PE. The presence of right heart strain has been associated with an increased risk of morbidity and mortality. Please refer to the Code PE Focused order set in EPIC. No other focal abnormality is noted. Aortic Atherosclerosis (ICD10-I70.0). Critical Value/emergent results were called by telephone at the time of interpretation on 05/30/2024 at 7:13 pm to Dr. SID BONING , who verbally acknowledged these results. Electronically Signed   By: Oneil Devonshire M.D.   On: 05/30/2024 19:16   DG Chest Portable 1  View Result Date: 05/30/2024 CLINICAL DATA:  Shortness of breath. History of left breast carcinoma. EXAM: PORTABLE CHEST 1 VIEW COMPARISON:  PET-CT dated 03/20/2024. FINDINGS: The heart size is within normal limits for technique. Mediastinal contours are within normal limits. Right chest Port-A-Cath tip overlies the lower SVC. Aortic atherosclerosis. No focal consolidation, pleural effusion, or pneumothorax. No acute osseous abnormality. IMPRESSION: 1. No acute cardiopulmonary findings. 2.  Aortic Atherosclerosis (ICD10-I70.0). Electronically Signed   By: Harrietta Sherry M.D.   On: 05/30/2024 15:58    Assessment: 74 y.o.  1.  Submassive PE with right heart strain 2.  Acute hypoxia respiratory failure 3. elevated troponin, likely demand ischemia 4.  Breast cancer, on Enhertu , patient declined surgery.  Plan:  - Lab and images reviewed, and discussed with patient and her daughter - She is on heparin  drip since yesterday, symptom has not improved much, I suggest IR consulted for thrombectomy or thrombolysis - Okay to discharge on oral anticoagulation, such as Eliquis - Hold Enhertu  for now - I will follow-up as needed in the hospital, and see her back after discharge  Onita Mattock, MD 05/31/2024  7:54 AM

## 2024-05-31 NOTE — Consult Note (Signed)
 Chief Complaint: Patient was seen in consultation today for pulmonary embolism, and with consideration for thrombolysis.  Referring Provider(s): Dr. Lebron Plants, MD   Supervising Physician: Hughes Simmonds  Patient Status: [facility] - In-pt  Patient is Full Code  History of Present Illness: [name] is a 73 y.o. female  with PMHx notable for HTN, HLD, anemia, asthma, DM, GERD, peptic ulcer, OA, uterine fibroid, and left breast cancer.   Patient is know to IR service, having most recently undergone Port-A-Cath placement on 03/28/24 by Dr. Jenna.  Per Dr. Thelda H&P note on 05/30/24:  1.Submassive PE with right heart strain 2.  Acute hypoxia respiratory failure 3. elevated troponin, likely demand ischemia 4.  Breast cancer, on Enhertu , patient declined surgery.   Plan:  - Lab and images reviewed, and discussed with patient and her daughter - She is on heparin  drip since yesterday, symptom has not improved much, I suggest IR consulted for thrombectomy or thrombolysis - Okay to discharge on oral anticoagulation, such as Eliquis  - Hold Enhertu  for now - I will follow-up as needed in the hospital, and see her back after discharge.  Interventional Radiology was requested for pulmonary embolism thrombolysis vs thrombectomy. Request was reviewed and approved for thrombolysis by Dr. Hughes.    Patient is alert and laying in bed, calm.  Patient is currently without any significant complaints. She is on 2 L O2. Patient denies any fevers, headache, chest pain, SOB, cough, abdominal pain, nausea, vomiting or bleeding.    Past Medical History:  Diagnosis Date   Acute MEE (middle ear effusion) 06/07/2014   Acute recurrent sinusitis 12/21/2014   Acute sinusitis 12/21/2014   Allergy    Anemia    hx   Asthma, intermittent 11/02/2018   ATTENTION DEFICIT, W/O HYPERACTIVITY 12/16/2006   Bilateral carpal tunnel syndrome 11/20/2016   Cataract    Cervical spondylosis with  myelopathy, History of 04/28/2012   COLON POLYP 12/16/2006   (02/03/12) Surgical [Pathology], sigmoid colon, polyp HYPERPLASTIC POLYP(S) AND POLYPOID FRAGMETNS OF BENIGN COLONIC MUCOSA WITH INTRAMUCOSAL LYMPHOID AGGREGATES. NO ADENOMATOUS CHANGE OR MALIGNANCY IDENTIFIED.    DIABETES MELLITUS II, UNCOMPLICATED 12/16/2006   Fall 10/18/2020   GERD (gastroesophageal reflux disease)    H/O abnormal mammogram 07/19/2009   S/P Breast Biopsy [facility], [place], 07/2009): Benign findings.     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/20/2016   OSTEOARTHRITIS, MULTI SITES 12/16/2006   Perennial allergic rhinitis with seasonal variation 12/16/2006        PONV (postoperative nausea and vomiting)    last surgery only   Recurrent maxillary sinusitis    SACROILIITIS, HISTORY OF 01/08/2009   Qualifier: History of  By: McDiarmid MD, Todd     Seasonal allergies    Seborrheic keratosis 01/15/2016   SKELETAL HYPEROSTOSIS 03/29/2008   Ulcer    Uterine fibroid 02/02/2011   TVUS: 4cm fibroid  w/ submucosal component, bil.hydrosalpinges, unable measurable endometrium - 08/18/2004     Past Surgical History:  Procedure Laterality Date   ANTERIOR CERVICAL DECOMP/DISCECTOMY FUSION  04/28/2012   Procedure: ANTERIOR CERVICAL DECOMPRESSION/DISCECTOMY FUSION 3 LEVELS;  Surgeon: Reyes JONETTA Budge, MD;  Location: [facility];  Service: Neurosurgery;   Laterality: N/A;  Cervical three-four,Cervical four-five,Cervical five-six anterior cervical decompression with fusion interbody prothesis plating and bonegraft   BREAST BIOPSY  07/19/2009   S/P Breast Biopsy [facility], [place], 07/2009): Benign findings.  (10/27/2010)   BREAST BIOPSY Left 02/25/2024   US  LT BREAST BX W LOC DEV 1ST LESION IMG BX SPEC US  GUIDE 02/25/2024 [facility]   BREAST BIOPSY Left 02/25/2024   US  LT BREAST BX W LOC DEV EA ADD LESION IMG BX SPEC US  GUIDE 02/25/2024 [facility]   BREAST BIOPSY Right 03/02/2024   US  RT BREAST BX W LOC DEV 1ST LESION IMG BX SPEC US  GUIDE 03/02/2024 [facility]   BREAST LUMPECTOMY Left    CARPAL TUNNEL RELEASE Left 12/10/2016   Procedure: LEFT CARPAL TUNNEL RELEASE;  Surgeon: Murrell Kuba, MD;  Location: [facility];  Service: Orthopedics;  Laterality: Left;   COLONOSCOPY W/ POLYPECTOMY  12/17/2000   colonoscopy (Dr Kristie) int. hemorrhoids &  nonneoplatic colon polyps - 01/10/2001,    COLONOSCOPY W/ POLYPECTOMY  01/18/2012   Dr Albertus (GI).sigmoid colon, hyperlastic polyp   ENDOMETRIAL BIOPSY  08/19/2004   Endometrial Biopsy - 09/01/2004,   IR IMAGING GUIDED PORT INSERTION  03/28/2024   NM MYOVIEW  LTD  04/18/2004   Cardiolite:EF63%, no ischemia - 05/01/2004,    REPLACEMENT TOTAL KNEE BILATERAL      Allergies: Januvia  [sitagliptin ], Jardiance  [empagliflozin ], Celecoxib, and Diclofenac-misoprostol  Medications: Prior to Admission medications   Medication Sig Start Date End Date Taking? Authorizing Provider  amLODipine -benazepril  (LOTREL) 10-20 MG capsule TAKE 1 CAPSULE BY MOUTH DAILY 01/28/24  Yes McDiarmid, Krystal JONETTA, MD  carvedilol  (COREG ) 6.25 MG tablet TAKE 1 TABLET(6.25 MG) BY MOUTH TWICE DAILY WITH A MEAL 11/18/23  Yes McDiarmid, Krystal JONETTA, MD  diphenoxylate -atropine  (LOMOTIL ) 2.5-0.025 MG tablet Take 1-2 tablets by mouth 4 (four) times daily as needed for diarrhea or loose stools. 05/09/24  Yes Lanny Callander,  MD  fexofenadine  (ALLEGRA ) 180 MG tablet Take 180 mg by mouth daily as needed for allergies or rhinitis.   Yes [provider]  gabapentin  (NEURONTIN ) 100 MG capsule TAKE 1 CAPSULE(100 MG) BY MOUTH THREE TIMES DAILY AS NEEDED Patient taking differently: Take 100 mg by mouth 3 (three) times daily as needed (for neuropathy). 04/28/24  Yes McDiarmid, Krystal JONETTA, MD  hydrochlorothiazide  (HYDRODIURIL ) 25 MG tablet TAKE 1 TABLET(25 MG) BY MOUTH DAILY 02/07/24  Yes McDiarmid, Krystal JONETTA, MD  HYDROcodone -acetaminophen  (NORCO) 7.5-325 MG tablet Take 1 tablet by mouth every 8 (eight) hours as needed for moderate pain (pain score 4-6). Patient taking differently: Take 1 tablet by mouth See admin instructions. Take 1 tablet by mouth two to three times a day 04/05/24 05/30/24 Yes McDiarmid, Krystal JONETTA, MD  lidocaine -prilocaine  (EMLA ) cream Apply 1 Application topically daily as needed. Use as directed for port access Patient taking differently: Apply 1 Application topically daily as needed (as directed for port access). 04/04/24  Yes Boscia, Heather E, NP  metFORMIN  (GLUCOPHAGE -XR) 500 MG 24 hr tablet TAKE 1 TABLET(500 MG) BY MOUTH TWICE DAILY WITH A MEAL 08/09/23  Yes McDiarmid, Krystal JONETTA, MD  methylphenidate  (RITALIN ) 10 MG tablet Take 2 tablets (20 mg total) by mouth  3 (three) times daily with meals. Patient taking differently: Take 20 mg by mouth See admin instructions. Take 20 mg by mouth two to three times a day with meals 04/05/24 05/30/24 Yes McDiarmid, Krystal BIRCH, MD  naproxen  (NAPROSYN ) 500 MG tablet TAKE 1 TABLET(500 MG) BY MOUTH TWICE DAILY WITH A MEAL Patient taking differently: Take 500 mg by mouth 2 (two) times daily as needed (for pain- take with food). 04/28/24  Yes McDiarmid, Krystal BIRCH, MD  ondansetron  (ZOFRAN ) 8 MG tablet Take 1 tablet (8 mg total) by mouth every 8 (eight) hours as needed for nausea or vomiting. Start on the third day after chemotherapy. 03/10/24  Yes Lanny Callander, MD  Prenat-Fe Carbonyl-FA-Omega 3  (ONE-A-DAY WOMENS PRENATAL 1) 28-0.8-235 MG CAPS Take 1 capsule by mouth daily with breakfast.   Yes [provider]  prochlorperazine  (COMPAZINE ) 10 MG tablet Take 1 tablet (10 mg total) by mouth every 6 (six) hours as needed for nausea or vomiting. 03/10/24  Yes Lanny Callander, MD  atorvastatin  (LIPITOR) 40 MG tablet Take 1 tablet (40 mg total) by mouth daily. Patient not taking: Reported on 05/30/2024 05/11/19   McDiarmid, Krystal BIRCH, MD  blood glucose meter kit and supplies KIT Dispense based on patient and insurance preference. Use up to four times daily as directed. 12/01/22   Christia Budds, MD  Blood Glucose Monitoring Suppl DEVI 1 each by Does not apply route in the morning, at noon, and at bedtime. May substitute to any manufacturer covered by patient's insurance. 12/01/22   Christia Budds, MD  vitamin B-12 (CYANOCOBALAMIN) 1000 MCG tablet Take 1 tablet (1,000 mcg total) by mouth daily. Patient not taking: Reported on 05/30/2024 05/11/19   McDiarmid, Krystal BIRCH, MD     Family History  Problem Relation Age of Onset   Hypertension Mother    Arthritis Mother    Cancer Sister 6       Breast   Cancer Sister 32       Breast   Arthritis Father    Diabetes Father    Heart attack Sister    Drug abuse Sister    Colon cancer Neg Hx    Esophageal cancer Neg Hx    Rectal cancer Neg Hx    Stomach cancer Neg Hx     Social History   Socioeconomic History   Marital status: Married    Spouse name: Not on file   Number of children: 1   Years of education: Not on file   Highest education level: Not on file  Occupational History   Occupation: Homemaker    Employer: RETIRED  Tobacco Use   Smoking status: Former    Current packs/day: 0.00    Average packs/day: 1 pack/day for 4.0 years (4.0 ttl pk-yrs)    Types: Cigarettes    Start date: 10/20/1975    Quit date: 10/20/1979    Years since quitting: 44.6   Smokeless tobacco: Never  Vaping Use   Vaping status: Never Used  Substance and Sexual  Activity   Alcohol use: No   Drug use: No   Sexual activity: Yes  Other Topics Concern   Not on file  Social History Narrative   Lives with husband one adult son with autism and a granddgt, no etoh, Mother of 14 adopted children, many with special needs .Regular exercise-no   Social Drivers of Health   Financial Resource Strain: Low Risk  (02/28/2024)   Overall Financial Resource Strain (CARDIA)    Difficulty of Paying  Living Expenses: Not hard at all  Food Insecurity: No Food Insecurity (05/30/2024)   Hunger Vital Sign    Worried About Running Out of Food in the Last Year: Never true    Ran Out of Food in the Last Year: Never true  Transportation Needs: No Transportation Needs (05/30/2024)   PRAPARE - Administrator, Civil Service (Medical): No    Lack of Transportation (Non-Medical): No  Physical Activity: Insufficiently Active (02/28/2024)   Exercise Vital Sign    Days of Exercise per Week: 2 days    Minutes of Exercise per Session: 20 min  Stress: No Stress Concern Present (02/28/2024)   [employer] of Occupational Health - Occupational Stress Questionnaire    Feeling of Stress : Not at all  Social Connections: Moderately Isolated (05/30/2024)   Social Connection and Isolation Panel    Frequency of Communication with Friends and Family: More than three times a week    Frequency of Social Gatherings with Friends and Family: More than three times a week    Attends Religious Services: Never    Database administrator or Organizations: No    Attends Banker Meetings: Never    Marital Status: Married     Review of Systems: A 12 point ROS discussed and pertinent positives are indicated in the HPI above.  All other systems are negative.  Vital Signs: BP (!) 193/63   Pulse 96   Temp 98 F (36.7 C) (Oral)   Resp 18   Ht 5' 1 (1.549 m)   Wt 186 lb 11.7 oz (84.7 kg)   SpO2 93%   BMI 35.28 kg/m   Advance Care Plan: The advanced care  place/surrogate decision maker was discussed at the time of visit and the patient did not wish to discuss or was not able to name a surrogate decision maker or provide an advance care plan.   Imaging: VAS US  LOWER EXTREMITY VENOUS (DVT) Result Date: 05/31/2024  Lower Venous DVT Study Patient Name:  [name]  Date of Exam:   05/31/2024 Medical Rec #: [id number]     Accession #:    [id number] Date of Birth: [birth year]     Patient Gender: F Patient Age:   34 years Exam Location:  [facility] Procedure:      VAS US  LOWER EXTREMITY VENOUS (DVT) Referring Phys: PROSPER AMPONSAH --------------------------------------------------------------------------------  Indications: Pulmonary embolism.  Risk Factors: Breast cancer, on chemotherapy. Limitations: Poor ultrasound/tissue interface and calcific shadowing. Comparison Study: No previous exams Performing Technologist: Jody Hill RVT, RDMS  Examination Guidelines: A complete evaluation includes B-mode imaging, spectral Doppler, color Doppler, and power Doppler as needed of all accessible portions of each vessel. Bilateral testing is considered an integral part of a complete examination. Limited examinations for reoccurring indications may be performed as noted. The reflux portion of the exam is performed with the patient in reverse Trendelenburg.  +---------+---------------+---------+-----------+----------+-------------------+ RIGHT    CompressibilityPhasicitySpontaneityPropertiesThrombus Aging      +---------+---------------+---------+-----------+----------+-------------------+ CFV      Full           No       Yes                                      +---------+---------------+---------+-----------+----------+-------------------+ SFJ      Full                                                             +---------+---------------+---------+-----------+----------+-------------------+  FV Prox  Full           No       Yes                                       +---------+---------------+---------+-----------+----------+-------------------+ FV Mid   Full           No       Yes                                      +---------+---------------+---------+-----------+----------+-------------------+ FV DistalFull           No       Yes                                      +---------+---------------+---------+-----------+----------+-------------------+ PFV      Full                                         Not well visualized +---------+---------------+---------+-----------+----------+-------------------+ POP      Full           No       Yes                                      +---------+---------------+---------+-----------+----------+-------------------+ PTV      Partial        No       No                   Age Indeterminate   +---------+---------------+---------+-----------+----------+-------------------+ PERO     Full                                                             +---------+---------------+---------+-----------+----------+-------------------+ Pulsatile doppler waveforms seen in lower extemity  +---------+---------------+---------+-----------+----------+-------------------+ LEFT     CompressibilityPhasicitySpontaneityPropertiesThrombus Aging      +---------+---------------+---------+-----------+----------+-------------------+ CFV      Full           No       Yes                                      +---------+---------------+---------+-----------+----------+-------------------+ SFJ      Full                                                             +---------+---------------+---------+-----------+----------+-------------------+ FV Prox  Full           Yes      Yes                                      +---------+---------------+---------+-----------+----------+-------------------+  FV Mid   Full           Yes      Yes                                       +---------+---------------+---------+-----------+----------+-------------------+ FV DistalFull           Yes      Yes                                      +---------+---------------+---------+-----------+----------+-------------------+ PFV      Full                                                             +---------+---------------+---------+-----------+----------+-------------------+ POP      Full           Yes      Yes                                      +---------+---------------+---------+-----------+----------+-------------------+ PTV      Partial        No       No                   age indeterminate -                                                       one of paired       +---------+---------------+---------+-----------+----------+-------------------+ PERO     None           No       No                   Age Indeterminate   +---------+---------------+---------+-----------+----------+-------------------+     Summary: RIGHT: - Findings consistent with age indeterminate deep vein thrombosis involving the right posterior tibial veins.   LEFT: - Findings consistent with age indeterminate deep vein thrombosis involving the left posterior tibial veins, and left peroneal veins.   *See table(s) above for measurements and observations. Electronically signed by Norman Serve on 05/31/2024 at 2:58:28 PM.    Final    CT Angio Chest PE W and/or Wo Contrast Result Date: 05/30/2024 CLINICAL DATA:  Tachycardia and chest pain EXAM: CT ANGIOGRAPHY CHEST WITH CONTRAST TECHNIQUE: Multidetector CT imaging of the chest was performed using the standard protocol during bolus administration of intravenous contrast. Multiplanar CT image reconstructions and MIPs were obtained to evaluate the vascular anatomy. RADIATION DOSE REDUCTION: This exam was performed according to the departmental dose-optimization program which includes automated exposure control, adjustment of the mA and/or kV  according to patient size and/or use of iterative reconstruction technique. CONTRAST:  75mL OMNIPAQUE  IOHEXOL  350 MG/ML SOLN COMPARISON:  Chest x-ray from earlier in the same day FINDINGS: Cardiovascular: Atherosclerotic calcifications of the thoracic aorta are noted. No aneurysmal dilatation or dissection is seen. The heart is  mildly enlarged. Coronary calcifications are noted. The pulmonary artery shows a normal branching pattern bilaterally. Scattered filling defects are noted bilaterally in segmental and subsegmental pulmonary arterial branches. RV/LV ratio of 1.4 is noted consistent with right heart strain. Mediastinum/Nodes: Thoracic inlet is within normal limits. No hilar or mediastinal adenopathy is noted. The esophagus as visualized is within normal limits. Lungs/Pleura: Mosaic attenuation is noted throughout both lungs consistent with patchy air trapping. Mild atelectatic changes are noted in the left posterior costophrenic angle. No sizable parenchymal nodule is seen. Upper Abdomen: No acute abnormality. Musculoskeletal: Degenerative changes of the thoracic spine are noted. No acute rib abnormality is noted. Review of the MIP images confirms the above findings. IMPRESSION: Positive for acute PE with CT evidence of right heart strain (RV/LV Ratio = 1.4) consistent with at least submassive (intermediate risk) PE. The presence of right heart strain has been associated with an increased risk of morbidity and mortality. Please refer to the Code PE Focused order set in EPIC. No other focal abnormality is noted. Aortic Atherosclerosis (ICD10-I70.0). Critical Value/emergent results were called by telephone at the time of interpretation on 05/30/2024 at 7:13 pm to Dr. SID BONING , who verbally acknowledged these results. Electronically Signed   By: Oneil Devonshire M.D.   On: 05/30/2024 19:16   DG Chest Portable 1 View Result Date: 05/30/2024 CLINICAL DATA:  Shortness of breath. History of left breast carcinoma.  EXAM: PORTABLE CHEST 1 VIEW COMPARISON:  PET-CT dated 03/20/2024. FINDINGS: The heart size is within normal limits for technique. Mediastinal contours are within normal limits. Right chest Port-A-Cath tip overlies the lower SVC. Aortic atherosclerosis. No focal consolidation, pleural effusion, or pneumothorax. No acute osseous abnormality. IMPRESSION: 1. No acute cardiopulmonary findings. 2.  Aortic Atherosclerosis (ICD10-I70.0). Electronically Signed   By: Harrietta Sherry M.D.   On: 05/30/2024 15:58    Labs:  CBC: Recent Labs    05/09/24 1348 05/30/24 1330 05/30/24 1528 05/31/24 0353  WBC 6.8 8.6 9.1 8.0  HGB 8.6* 8.9* 8.4* 7.1*  HCT 26.6* 26.7* 26.6* 23.2*  PLT 472* 500* 465* 398    COAGS: No results for input(s): INR, APTT in the last 8760 hours.  BMP: Recent Labs    05/09/24 1348 05/30/24 1330 05/30/24 1528 05/31/24 0353  NA 140 140 138 138  K 4.2 3.8 3.5 3.3*  CL 107 107 108 108  CO2 27 22 20* 23  GLUCOSE 122* 191* 147* 118*  BUN 13 13 13 10   CALCIUM  8.7* 8.2* 8.2* 7.8*  CREATININE 0.94 1.10* 1.02* 0.82  GFRNONAA >60 53* 58* >60    LIVER FUNCTION TESTS: Recent Labs    04/19/24 1250 05/09/24 1348 05/30/24 1330 05/30/24 1528  BILITOT 0.1 0.2 0.2 0.3  AST 18 28 19 23   ALT 33 38 21 23  ALKPHOS 52 58 64 55  PROT 5.7* 6.1* 6.0* 5.7*  ALBUMIN 3.0* 3.3* 3.2* 2.4*    TUMOR MARKERS: No results for input(s): AFPTM, CEA, CA199, CHROMGRNA in the last 8760 hours.  Assessment and Plan: Per Dr. Thelda H&P note on 05/30/24:  1.Submassive PE with right heart strain 2.  Acute hypoxia respiratory failure 3. elevated troponin, likely demand ischemia 4.  Breast cancer, on Enhertu , patient declined surgery.   Plan:  - Lab and images reviewed, and discussed with patient and her daughter - She is on heparin  drip since yesterday, symptom has not improved much, I suggest IR consulted for thrombectomy or thrombolysis - Okay to discharge on oral  anticoagulation,  such as Eliquis  - Hold Enhertu  for now - I will follow-up as needed in the hospital, and see her back after discharge.  Patient was approved for thrombolysis by Dr. Hughes. However, patient deferred the procedure until such time that she is able to fully discuss this with her husband. Patient is aware of the risk associated with lack of action, and the increased risk of mortality. Patient also had reservations about bleeding risk on TPA. IR will remain available should she decide to proceed with PE thrombolysis.   Thank you for allowing our service to participate in [name] 's care.  Electronically Signed: Carlin DELENA Griffon, PA-C   05/31/2024, 3:50 PM      I spent a total of 40 Minutes in face to face in clinical consultation, greater than 50% of which was counseling/coordinating care for pulmonary embolism, and with consideration for thrombolysis.

## 2024-05-31 NOTE — Consult Note (Signed)
 NAME:  [name], MRN:  [id number], DOB:  Jan 29, 1951, LOS: 1 ADMISSION DATE:  05/30/2024, CONSULTATION DATE:  05/31/24 REFERRING MD:  Mugweru , CHIEF COMPLAINT:  PE   History of Present Illness:  73 yo F PMH breast cancer currently on Enhertu , DM who was admitted to [facility] 8/12 after presenting to ED from her oncology appointment for eval for SOB and weakness. She was found to have bilateral segmental and subsegmental PE, with RV/LV ration 1.4, concerning for R heart strain. Her BNP was elevated 370 and trop 103. She was started on a hep gtt. Trops subsequently have down trended to 77  IR was asked to evaluate the patient, and recommended PCCM consultation 05/31/24 to help decide course of action   Pertinent  Medical History  Breast cancer DM   Significant Hospital Events: Including procedures, antibiotic start and stop dates in addition to other pertinent events   8/12 ED at referral of onc office for SOB eval. Found to have bilat segmental/subsegmental PE. Hep gtt and admitted to [facility]  8/13 IR consulted, possible catheter directed lytics candidate. PCCM consulted   Interim History / Subjective:  On heparin  infusion Normotensive, not tachycardic.  Variable SpO2 -- at times high 80s on RA, at times mid 90s.   Objective    Blood pressure (!) 193/63, pulse 96, temperature 98 F (36.7 C), temperature source Oral, resp. rate 18, height 5' 1 (1.549 m), weight 84.7 kg, SpO2 93%.        Intake/Output Summary (Last 24 hours) at 05/31/2024 1429 Last data filed at 05/31/2024 1243 Gross per 24 hour  Intake 269.27 ml  Output --  Net 269.27 ml   Filed Weights   05/30/24 1502 05/30/24 2200  Weight: 85.3 kg 84.7 kg    Examination: General: chronically ill older adult F NAD  HENT: NCAT  Lungs: CTAb Cardiovascular: rr Abdomen: soft ndnt  Extremities: no obvious acute joint deformity  Neuro: AAOx4  GU: defer   Resolved problem list   Assessment and Plan    Submassive PE  // BLE  DVT  Anemia  Breast cancer on Enhertu   -PESI 104 -- Class III, intermediate risk  -bilat segmental and subsegmental clot. C/f R heart strain on CT. Elevated trops, BNP  -not amenable to thrombectomy, but could be catheter directed lytics candidate  P  -continue heparin  gtt -in general however, pt would like to avoid invasive procedures unless absolutely indicated  -my hope, especially based on her wishes to avoid things like transfusions unless absolutely necessary and avoid procedure unless absolutely indicated, is that we will be able to continue hep gtt and then later transition to PO agent.  -Awaiting ECHO read which will help give a better idea of her RV  -will cont to watch VS closely. At times req small amounts of Vance    Best Practice (right click and Reselect all SmartList Selections daily)   Per primary   Labs   CBC: Recent Labs  Lab 05/30/24 1330 05/30/24 1528 05/31/24 0353  WBC 8.6 9.1 8.0  NEUTROABS 4.1 4.6  --   HGB 8.9* 8.4* 7.1*  HCT 26.7* 26.6* 23.2*  MCV 81.2 84.7 84.7  PLT 500* 465* 398    Basic Metabolic Panel: Recent Labs  Lab 05/30/24 1330 05/30/24 1528 05/31/24 0353  NA 140 138 138  K 3.8 3.5 3.3*  CL 107 108 108  CO2 22 20* 23  GLUCOSE 191* 147* 118*  BUN 13 13 10   CREATININE 1.10*  1.02* 0.82  CALCIUM  8.2* 8.2* 7.8*  MG  --   --  1.4*   GFR: Estimated Creatinine Clearance: 59.5 mL/min (by C-G formula based on SCr of 0.82 mg/dL). Recent Labs  Lab 05/30/24 1330 05/30/24 1528 05/31/24 0353  WBC 8.6 9.1 8.0    Liver Function Tests: Recent Labs  Lab 05/30/24 1330 05/30/24 1528  AST 19 23  ALT 21 23  ALKPHOS 64 55  BILITOT 0.2 0.3  PROT 6.0* 5.7*  ALBUMIN 3.2* 2.4*   No results for input(s): LIPASE, AMYLASE in the last 168 hours. No results for input(s): AMMONIA in the last 168 hours.  ABG No results found for: PHART, PCO2ART, PO2ART, HCO3, TCO2, ACIDBASEDEF, O2SAT   Coagulation Profile: No results for  input(s): INR, PROTIME in the last 168 hours.  Cardiac Enzymes: No results for input(s): CKTOTAL, CKMB, CKMBINDEX, TROPONINI in the last 168 hours.  HbA1C: HbA1c, POC (controlled diabetic range)  Date/Time Value Ref Range Status  01/20/2024 09:28 AM 7.1 (A) 0.0 - 7.0 % Final  09/02/2023 02:45 PM 6.8 0.0 - 7.0 % Final    CBG: Recent Labs  Lab 05/31/24 0841 05/31/24 1119  GLUCAP 159* 135*    Review of Systems:   Review of Systems  Constitutional:        Loss of appetite  Respiratory:  Positive for shortness of breath.   Cardiovascular:  Positive for chest pain.     Past Medical History:  She,  has a past medical history of Acute MEE (middle ear effusion) (06/07/2014), Acute recurrent sinusitis (12/21/2014), Acute sinusitis (12/21/2014), Allergy, Anemia, Asthma, intermittent (11/02/2018), ATTENTION DEFICIT, W/O HYPERACTIVITY (12/16/2006), Bilateral carpal tunnel syndrome (11/20/2016), Cataract, Cervical spondylosis with myelopathy, History of (04/28/2012), COLON POLYP (12/16/2006), DIABETES MELLITUS II, UNCOMPLICATED (12/16/2006), Fall (10/18/2020), GERD (gastroesophageal reflux disease), H/O abnormal mammogram (07/19/2009), H/O total knee replacement (10/28/2012), HEMORRHOIDS, NOS (12/16/2006), History of blood transfusion (10/2004), History of peptic ulcer (12/16/2006), History of right greater trochanteric bursitis (11/07/2009), HYPERCHOLESTEROLEMIA (02/28/2007), HYPERTENSION, BENIGN SYSTEMIC (12/16/2006), Hyponatremia (08/30/2015), INCONTINENCE, URGE (12/16/2006), 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   Surgical History:   Past Surgical History:  Procedure Laterality Date   ANTERIOR CERVICAL DECOMP/DISCECTOMY FUSION  04/28/2012   Procedure: ANTERIOR CERVICAL DECOMPRESSION/DISCECTOMY FUSION 3 LEVELS;  Surgeon: Reyes JONETTA Budge, MD;  Location: [facility];  Service: Neurosurgery;  Laterality: N/A;  Cervical three-four,Cervical four-five,Cervical five-six anterior cervical decompression with fusion interbody prothesis plating and bonegraft   BREAST BIOPSY  07/19/2009   S/P Breast Biopsy [facility], [place], 07/2009): Benign findings.  (10/27/2010)   BREAST BIOPSY Left 02/25/2024   US  LT BREAST BX W LOC DEV 1ST LESION IMG BX SPEC US  GUIDE 02/25/2024 [facility]   BREAST BIOPSY Left 02/25/2024   US  LT BREAST BX W LOC DEV EA ADD LESION IMG BX SPEC US  GUIDE 02/25/2024 [facility]   BREAST BIOPSY Right 03/02/2024   US  RT BREAST BX W LOC DEV 1ST LESION IMG BX SPEC US  GUIDE 03/02/2024 [facility]   BREAST LUMPECTOMY Left    CARPAL TUNNEL RELEASE Left 12/10/2016   Procedure: LEFT CARPAL TUNNEL RELEASE;  Surgeon: Murrell Kuba, MD;  Location: [facility];  Service: Orthopedics;  Laterality: Left;   COLONOSCOPY  W/ POLYPECTOMY  12/17/2000   colonoscopy (Dr Kristie) int. hemorrhoids &  nonneoplatic colon polyps - 01/10/2001,    COLONOSCOPY W/ POLYPECTOMY  01/18/2012   Dr Albertus (GI).sigmoid colon, hyperlastic polyp   ENDOMETRIAL BIOPSY  08/19/2004   Endometrial Biopsy - 09/01/2004,   IR IMAGING GUIDED PORT INSERTION  03/28/2024   NM MYOVIEW  LTD  04/18/2004   Cardiolite:EF63%, no ischemia - 05/01/2004,    REPLACEMENT TOTAL KNEE BILATERAL       Social History:   reports that she quit smoking about 44 years ago. Her smoking use included cigarettes. She started smoking about 48 years ago. She has a 4 pack-year smoking history. She has never used smokeless tobacco. She reports that she does not drink alcohol and does  not use drugs.   Family History:  Her family history includes Arthritis in her father and mother; Cancer (age of onset: 37) in her sister and sister; Diabetes in her father; Drug abuse in her sister; Heart attack in her sister; Hypertension in her mother. There is no history of Colon cancer, Esophageal cancer, Rectal cancer, or Stomach cancer.   Allergies Allergies  Allergen Reactions   Januvia  [Sitagliptin ] Other (See Comments)    Epigastric abdominal pain   Jardiance  [Empagliflozin ] Other (See Comments)    Lightheadedness, near-syncope, extreme yeast problems   Celecoxib Palpitations   Diclofenac-Misoprostol Palpitations and Other (See Comments)    Arthrotec     Home Medications  Prior to Admission medications   Medication Sig Start Date End Date Taking? Authorizing Provider  amLODipine -benazepril  (LOTREL) 10-20 MG capsule TAKE 1 CAPSULE BY MOUTH DAILY 01/28/24  Yes McDiarmid, Krystal BIRCH, MD  carvedilol  (COREG ) 6.25 MG tablet TAKE 1 TABLET(6.25 MG) BY MOUTH TWICE DAILY WITH A MEAL 11/18/23  Yes McDiarmid, Krystal BIRCH, MD  diphenoxylate -atropine  (LOMOTIL ) 2.5-0.025 MG tablet Take 1-2 tablets by mouth 4 (four) times daily as needed for diarrhea or loose stools. 05/09/24  Yes Lanny Callander, MD  fexofenadine  (ALLEGRA ) 180 MG tablet Take 180 mg by mouth daily as needed for allergies or rhinitis.   Yes [provider]  gabapentin  (NEURONTIN ) 100 MG capsule TAKE 1 CAPSULE(100 MG) BY MOUTH THREE TIMES DAILY AS NEEDED Patient taking differently: Take 100 mg by mouth 3 (three) times daily as needed (for neuropathy). 04/28/24  Yes McDiarmid, Krystal BIRCH, MD  hydrochlorothiazide  (HYDRODIURIL ) 25 MG tablet TAKE 1 TABLET(25 MG) BY MOUTH DAILY 02/07/24  Yes McDiarmid, Krystal BIRCH, MD  HYDROcodone -acetaminophen  (NORCO) 7.5-325 MG tablet Take 1 tablet by mouth every 8 (eight) hours as needed for moderate pain (pain score 4-6). Patient taking differently: Take 1 tablet by mouth See admin instructions. Take 1 tablet by  mouth two to three times a day 04/05/24 05/30/24 Yes McDiarmid, Krystal BIRCH, MD  lidocaine -prilocaine  (EMLA ) cream Apply 1 Application topically daily as needed. Use as directed for port access Patient taking differently: Apply 1 Application topically daily as needed (as directed for port access). 04/04/24  Yes Boscia, Heather E, NP  metFORMIN  (GLUCOPHAGE -XR) 500 MG 24 hr tablet TAKE 1 TABLET(500 MG) BY MOUTH TWICE DAILY WITH A MEAL 08/09/23  Yes McDiarmid, Krystal BIRCH, MD  methylphenidate  (RITALIN ) 10 MG tablet Take 2 tablets (20 mg total) by mouth 3 (three) times daily with meals. Patient taking differently: Take 20 mg by mouth See admin instructions. Take 20 mg by mouth two to three times a day with meals 04/05/24 05/30/24 Yes McDiarmid, Krystal BIRCH, MD  naproxen  (NAPROSYN ) 500 MG tablet TAKE 1 TABLET(500 MG) BY MOUTH  TWICE DAILY WITH A MEAL Patient taking differently: Take 500 mg by mouth 2 (two) times daily as needed (for pain- take with food). 04/28/24  Yes McDiarmid, Krystal BIRCH, MD  ondansetron  (ZOFRAN ) 8 MG tablet Take 1 tablet (8 mg total) by mouth every 8 (eight) hours as needed for nausea or vomiting. Start on the third day after chemotherapy. 03/10/24  Yes Lanny Callander, MD  Prenat-Fe Carbonyl-FA-Omega 3 (ONE-A-DAY WOMENS PRENATAL 1) 28-0.8-235 MG CAPS Take 1 capsule by mouth daily with breakfast.   Yes [provider]  prochlorperazine  (COMPAZINE ) 10 MG tablet Take 1 tablet (10 mg total) by mouth every 6 (six) hours as needed for nausea or vomiting. 03/10/24  Yes Lanny Callander, MD  atorvastatin  (LIPITOR) 40 MG tablet Take 1 tablet (40 mg total) by mouth daily. Patient not taking: Reported on 05/30/2024 05/11/19   McDiarmid, Krystal BIRCH, MD  blood glucose meter kit and supplies KIT Dispense based on patient and insurance preference. Use up to four times daily as directed. 12/01/22   Christia Budds, MD  Blood Glucose Monitoring Suppl DEVI 1 each by Does not apply route in the morning, at noon, and at bedtime. May  substitute to any manufacturer covered by patient's insurance. 12/01/22   Christia Budds, MD  vitamin B-12 (CYANOCOBALAMIN) 1000 MCG tablet Take 1 tablet (1,000 mcg total) by mouth daily. Patient not taking: Reported on 05/30/2024 05/11/19   McDiarmid, Krystal BIRCH, MD     Critical care time: na    High mdm  Ronnald Gave MSN, AGACNP-BC [facility] Pulmonary/Critical Care Medicine Amion for pager 05/31/2024, 2:29 PM

## 2024-05-31 NOTE — Telephone Encounter (Signed)
 Patient Product/process development scientist completed.    The patient is insured through [facility] MEDICAID and [employer].     Ran test claim for Eliquis  5 mg and the current 30 day co-pay is $4.00.  Ran test claim for Xarelto 20 mg and the current 30 day co-pay is $4.00.  This test claim was processed through [facility]- copay amounts may vary at other pharmacies due to pharmacy/plan contracts, or as the patient moves through the different stages of their insurance plan.     Reyes Sharps, CPHT Pharmacy Technician III Certified Patient Advocate [facility] Pharmacy Patient Advocate Team Direct Number: [phone]  Fax: [phone]

## 2024-05-31 NOTE — Consult Note (Signed)
 WOC Nurse Consult Note: Reason for Consult: buttocks wound  Wound type: Stage 3 Pressure Injury Buttocks  Pressure Injury POA: Yes Measurement: see nursing flowsheet  Wound bed:50% pink 50% tan fibrinous  Drainage (amount, consistency, odor) see nursing flowsheet  Periwound:intact  Dressing procedure/placement/frequency: Cleanse buttocks wounds with NS, apply Medihoney to wound beds daily, cover with dry gauze and secure with silicone foam.    POC discussed with bedside nurse WOC team will not follow. Re-consult if further needs arise.   Thank you,    Powell Bar MSN, RN-BC, [employer]

## 2024-05-31 NOTE — Progress Notes (Signed)
 PHARMACY - ANTICOAGULATION CONSULT NOTE  Pharmacy Consult for heparin  Indication: pulmonary embolus  Allergies  Allergen Reactions   Januvia  [Sitagliptin ] Other (See Comments)    Epigastric abdominal pain   Jardiance  [Empagliflozin ] Other (See Comments)    Lightheadedness, near-syncope, extreme yeast problems   Celecoxib Palpitations   Diclofenac-Misoprostol Palpitations and Other (See Comments)    Arthrotec    Patient Measurements: Height: 5' 1 (154.9 cm) Weight: 84.7 kg (186 lb 11.7 oz) IBW/kg (Calculated) : 47.8 HEPARIN  DW (KG): 67.2  Vital Signs: Temp: 99.1 F (37.3 C) (08/12 2348) Temp Source: Oral (08/12 2348) BP: 141/80 (08/12 2037) Pulse Rate: 91 (08/12 2037)  Labs: Recent Labs    05/30/24 1330 05/30/24 1528 05/30/24 1742 05/31/24 0117 05/31/24 0353  HGB 8.9* 8.4*  --   --  7.1*  HCT 26.7* 26.6*  --   --  23.2*  PLT 500* 465*  --   --  398  HEPARINUNFRC  --   --   --   --  0.29*  CREATININE 1.10* 1.02*  --   --  0.82  TROPONINIHS  --  103* 111* 93*  --     Estimated Creatinine Clearance: 59.5 mL/min (by C-G formula based on SCr of 0.82 mg/dL).   Medical History: Past Medical History:  Diagnosis Date   Acute MEE (middle ear effusion) 06/07/2014   Acute recurrent sinusitis 12/21/2014   Acute sinusitis 12/21/2014   Allergy    Anemia    hx   Asthma, intermittent 11/02/2018   ATTENTION DEFICIT, W/O HYPERACTIVITY 12/16/2006   Bilateral carpal tunnel syndrome 11/20/2016   Cataract    Cervical spondylosis with myelopathy, History of 04/28/2012   COLON POLYP 12/16/2006   (02/03/12) Surgical [Pathology], sigmoid colon, polyp HYPERPLASTIC POLYP(S) AND POLYPOID FRAGMETNS OF BENIGN COLONIC MUCOSA WITH INTRAMUCOSAL LYMPHOID AGGREGATES. NO ADENOMATOUS CHANGE OR MALIGNANCY IDENTIFIED.    DIABETES MELLITUS II, UNCOMPLICATED 12/16/2006   Fall 10/18/2020   GERD (gastroesophageal reflux disease)    H/O abnormal mammogram 07/19/2009   S/P Breast Biopsy [facility], [place], 07/2009): Benign findings.     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/20/2016   OSTEOARTHRITIS, MULTI SITES 12/16/2006   Perennial allergic rhinitis with seasonal variation 12/16/2006        PONV (postoperative nausea and vomiting)    last surgery only   Recurrent maxillary sinusitis    SACROILIITIS, HISTORY OF 01/08/2009   Qualifier: History of  By: McDiarmid MD, Todd     Seasonal allergies    Seborrheic keratosis 01/15/2016   SKELETAL HYPEROSTOSIS 03/29/2008   Ulcer    Uterine fibroid 02/02/2011   TVUS: 4cm fibroid w/ submucosal component, bil.hydrosalpinges, unable measurable endometrium - 08/18/2004     Medications:  No anticoagulants PTA  Assessment: 73 yo F with acute PE with right heart strain. Pharmacy consulted to manage heparin .  SCr 1.02 Hg 8.4, PLT 465  05/31/24 Heparin  level = 0.29 (slightly subtherapeutic) with heparin  gtt @ 1100 units/hr Hgb = 7.1 (dec), Pltc 398k No complications of therapy noted  Goal of Therapy:  Heparin  level 0.3-0.7 units/ml Monitor platelets by anticoagulation protocol: Yes   Plan:  Increase Heparin  drip to 1200 units/hr Heparin  level 8 hours after rate increase Daily CBC & heparin  level F/u for transition to DOAC   Baltasar Twilley, PharmD 05/31/2024 4:46 AM

## 2024-05-31 NOTE — Progress Notes (Signed)
 BLE venous duplex has been completed.  Preliminary results given to Dr. Donnamarie and [name].   Results can be found under chart review under CV PROC. 05/31/2024 11:42 AM Lonette Stevison RVT, RDMS

## 2024-05-31 NOTE — Plan of Care (Signed)
  Problem: Education: Goal: Knowledge of General Education information will improve Description: Including pain rating scale, medication(s)/side effects and non-pharmacologic comfort measures Outcome: Progressing   Problem: Clinical Measurements: Goal: Respiratory complications will improve Outcome: Progressing Goal: Cardiovascular complication will be avoided Outcome: Progressing   Problem: Elimination: Goal: Will not experience complications related to urinary retention Outcome: Progressing   Problem: Pain Managment: Goal: General experience of comfort will improve and/or be controlled Outcome: Not Progressing

## 2024-05-31 NOTE — Plan of Care (Signed)
  Problem: Education: Goal: Knowledge of General Education information will improve Description: Including pain rating scale, medication(s)/side effects and non-pharmacologic comfort measures Outcome: Progressing   Problem: Clinical Measurements: Goal: Cardiovascular complication will be avoided Outcome: Progressing   Problem: Nutrition: Goal: Adequate nutrition will be maintained Outcome: Progressing   Problem: Coping: Goal: Level of anxiety will decrease Outcome: Progressing   Problem: Elimination: Goal: Will not experience complications related to urinary retention Outcome: Progressing   Problem: Pain Managment: Goal: General experience of comfort will improve and/or be controlled Outcome: Progressing

## 2024-06-01 ENCOUNTER — Telehealth: Payer: Self-pay | Admitting: Nurse Practitioner

## 2024-06-01 DIAGNOSIS — D509 Iron deficiency anemia, unspecified: Secondary | ICD-10-CM

## 2024-06-01 DIAGNOSIS — I2699 Other pulmonary embolism without acute cor pulmonale: Secondary | ICD-10-CM | POA: Diagnosis not present

## 2024-06-01 LAB — URINALYSIS, ROUTINE W REFLEX MICROSCOPIC
Bacteria, UA: NONE SEEN
Bilirubin Urine: NEGATIVE
Glucose, UA: NEGATIVE mg/dL
Hgb urine dipstick: NEGATIVE
Ketones, ur: 5 mg/dL — AB
Leukocytes,Ua: NEGATIVE
Nitrite: NEGATIVE
Protein, ur: 30 mg/dL — AB
Specific Gravity, Urine: 1.02 (ref 1.005–1.030)
pH: 5 (ref 5.0–8.0)

## 2024-06-01 LAB — MAGNESIUM: Magnesium: 1.7 mg/dL (ref 1.7–2.4)

## 2024-06-01 LAB — ECHOCARDIOGRAM COMPLETE
AR max vel: 1.46 cm2
AV Area VTI: 1.52 cm2
AV Area mean vel: 1.46 cm2
AV Mean grad: 5 mmHg
AV Peak grad: 10.2 mmHg
Ao pk vel: 1.6 m/s
Area-P 1/2: 4.41 cm2
Height: 61 in
S' Lateral: 2.4 cm
Weight: 2987.67 [oz_av]

## 2024-06-01 LAB — GLUCOSE, CAPILLARY
Glucose-Capillary: 99 mg/dL (ref 70–99)
Glucose-Capillary: 139 mg/dL — ABNORMAL HIGH (ref 70–99)
Glucose-Capillary: 139 mg/dL — ABNORMAL HIGH (ref 70–99)
Glucose-Capillary: 139 mg/dL — ABNORMAL HIGH (ref 70–99)

## 2024-06-01 LAB — CBC
HCT: 22.4 % — ABNORMAL LOW (ref 36.0–46.0)
Hemoglobin: 6.8 g/dL — CL (ref 12.0–15.0)
MCH: 26 pg (ref 26.0–34.0)
MCHC: 30.4 g/dL (ref 30.0–36.0)
MCV: 85.5 fL (ref 80.0–100.0)
Platelets: 436 K/uL — ABNORMAL HIGH (ref 150–400)
RBC: 2.62 MIL/uL — ABNORMAL LOW (ref 3.87–5.11)
RDW: 15.8 % — ABNORMAL HIGH (ref 11.5–15.5)
WBC: 9 K/uL (ref 4.0–10.5)
nRBC: 0.4 % — ABNORMAL HIGH (ref 0.0–0.2)

## 2024-06-01 LAB — BASIC METABOLIC PANEL WITH GFR
Anion gap: 8 (ref 5–15)
BUN: 9 mg/dL (ref 8–23)
CO2: 22 mmol/L (ref 22–32)
Calcium: 7.9 mg/dL — ABNORMAL LOW (ref 8.9–10.3)
Chloride: 108 mmol/L (ref 98–111)
Creatinine, Ser: 0.75 mg/dL (ref 0.44–1.00)
GFR, Estimated: >60 mL/min (ref >60)
Glucose, Bld: 91 mg/dL (ref 70–99)
Potassium: 3.5 mmol/L (ref 3.5–5.1)
Sodium: 138 mmol/L (ref 135–145)

## 2024-06-01 LAB — VITAMIN B12: Vitamin B-12: 1235 pg/mL — ABNORMAL HIGH (ref 180–914)

## 2024-06-01 LAB — HEMOGLOBIN AND HEMATOCRIT, BLOOD
HCT: 29.9 % — ABNORMAL LOW (ref 36.0–46.0)
Hemoglobin: 9.3 g/dL — ABNORMAL LOW (ref 12.0–15.0)

## 2024-06-01 LAB — PREPARE RBC (CROSSMATCH)

## 2024-06-01 LAB — FOLATE: Folate: 14.2 ng/mL (ref >5.9)

## 2024-06-01 LAB — IRON AND TIBC
Iron: 11 ug/dL — ABNORMAL LOW (ref 28–170)
Saturation Ratios: 6 % — ABNORMAL LOW (ref 10.4–31.8)
TIBC: 188 ug/dL — ABNORMAL LOW (ref 250–450)
UIBC: 177 ug/dL

## 2024-06-01 LAB — ABO/RH: ABO/RH(D): O POS

## 2024-06-01 LAB — FERRITIN: Ferritin: 140 ng/mL (ref 11–307)

## 2024-06-01 LAB — HEPARIN LEVEL (UNFRACTIONATED)
Heparin Unfractionated: 0.46 [IU]/mL (ref 0.30–0.70)
Heparin Unfractionated: 0.46 [IU]/mL (ref 0.30–0.70)

## 2024-06-01 MED ORDER — SODIUM CHLORIDE 0.9% IV SOLUTION: Freq: Once | INTRAVENOUS | Status: AC

## 2024-06-01 MED ORDER — BENAZEPRIL HCL 20 MG PO TABS
20.0000 mg | ORAL_TABLET | Freq: Every day | ORAL | Status: DC
  Administered 2024-06-01 – 2024-06-03 (×3): 20 mg via ORAL
  Filled 2024-06-01 (×3): qty 1

## 2024-06-01 NOTE — Telephone Encounter (Signed)
 Made soonest avail appt. In mid Sept.

## 2024-06-01 NOTE — Progress Notes (Signed)
 PHARMACY - ANTICOAGULATION CONSULT NOTE  Pharmacy Consult for heparin  Indication: pulmonary embolus  Allergies  Allergen Reactions   Januvia  [Sitagliptin ] Other (See Comments)    Epigastric abdominal pain   Jardiance  [Empagliflozin ] Other (See Comments)    Lightheadedness, near-syncope, extreme yeast problems   Celecoxib Palpitations   Diclofenac-Misoprostol Palpitations and Other (See Comments)    Arthrotec    Patient Measurements: Height: 5' 1 (154.9 cm) Weight: 84.7 kg (186 lb 11.7 oz) IBW/kg (Calculated) : 47.8 HEPARIN  DW (KG): 67.2  Vital Signs: Temp: 98.8 F (37.1 C) (08/14 0400) Temp Source: Oral (08/14 0400) BP: 140/51 (08/14 0600) Pulse Rate: 81 (08/14 0700)  Labs: Recent Labs    05/30/24 1528 05/30/24 1528 05/30/24 1742 05/31/24 0117 05/31/24 0353 05/31/24 1525 06/01/24 0049 06/01/24 0455  HGB 8.4*  --   --   --  7.1*  --   --  6.8*  HCT 26.6*  --   --   --  23.2*  --   --  22.4*  PLT 465*  --   --   --  398  --   --  436*  HEPARINUNFRC  --    < >  --   --  0.29* 0.42 0.46 0.46  CREATININE 1.02*  --   --   --  0.82  --   --  0.75  TROPONINIHS 103*  --  111* 93* 77*  --   --   --    < > = values in this interval not displayed.    Estimated Creatinine Clearance: 61 mL/min (by C-G formula based on SCr of 0.75 mg/dL).   Medical History: Past Medical History:  Diagnosis Date   Acute MEE (middle ear effusion) 06/07/2014   Acute recurrent sinusitis 12/21/2014   Acute sinusitis 12/21/2014   Allergy    Anemia    hx   Asthma, intermittent 11/02/2018   ATTENTION DEFICIT, W/O HYPERACTIVITY 12/16/2006   Bilateral carpal tunnel syndrome 11/20/2016   Cataract    Cervical spondylosis with myelopathy, History of 04/28/2012   COLON POLYP 12/16/2006   (02/03/12) Surgical [Pathology], sigmoid colon, polyp HYPERPLASTIC POLYP(S) AND POLYPOID FRAGMETNS OF BENIGN COLONIC MUCOSA WITH INTRAMUCOSAL LYMPHOID AGGREGATES. NO ADENOMATOUS CHANGE OR MALIGNANCY IDENTIFIED.     DIABETES MELLITUS II, UNCOMPLICATED 12/16/2006   Fall 10/18/2020   GERD (gastroesophageal reflux disease)    H/O abnormal mammogram 07/19/2009   S/P Breast Biopsy [facility], [place], 07/2009): Benign findings.     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/20/2016   OSTEOARTHRITIS, MULTI SITES 12/16/2006   Perennial allergic rhinitis with seasonal variation 12/16/2006        PONV (postoperative nausea and vomiting)    last surgery only   Recurrent maxillary sinusitis    SACROILIITIS, HISTORY OF 01/08/2009   Qualifier: History of  By: McDiarmid MD, Todd     Seasonal allergies    Seborrheic  keratosis 01/15/2016   SKELETAL HYPEROSTOSIS 03/29/2008   Ulcer    Uterine fibroid 02/02/2011   TVUS: 4cm fibroid w/ submucosal component, bil.hydrosalpinges, unable measurable endometrium - 08/18/2004     Medications:  No anticoagulants PTA  Assessment: 73 yo F with acute PE with right heart strain. Pharmacy consulted to manage heparin .   On 8/13, IR consulted for thrombolysis. Patient would like to avoid any invasive procedures if possible and concerned about bleeding risk with alteplase . Decision made to defer procedure at that time.  06/01/24 Heparin  level 0.46 remains therapeutic with heparin  infusing at 1200 units/hr Hgb down to 6.8 - note plan to transfuse today   Goal of Therapy:  Heparin  level 0.3-0.7 units/ml Monitor  platelets by anticoagulation protocol: Yes   Plan:  -Continue heparin  infusion at 1200 units/hr -Daily CBC & heparin  level -Monitor closely for any new or worsening bleeding -Will continue to follow plans for possible procedure and transition to oral anticoagulant once appropriate   Thank you for allowing pharmacy to be a part of this patient's care.  Stefano MARLA Bologna, PharmD, BCPS Clinical Pharmacist 06/01/2024 7:25 AM

## 2024-06-01 NOTE — Plan of Care (Signed)
  Problem: Health Behavior/Discharge Planning: Goal: Ability to manage health-related needs will improve Outcome: Progressing   Problem: Clinical Measurements: Goal: Ability to maintain clinical measurements within normal limits will improve Outcome: Progressing Goal: Will remain free from infection Outcome: Progressing Goal: Diagnostic test results will improve Outcome: Progressing Goal: Respiratory complications will improve Outcome: Progressing Goal: Cardiovascular complication will be avoided Outcome: Progressing   Problem: Clinical Measurements: Goal: Diagnostic test results will improve Outcome: Progressing

## 2024-06-01 NOTE — Progress Notes (Addendum)
 [name]   DOB:12-Aug-1951   FM#:[id number]   RDW#:[id number]  Medical oncology follow-up note  Subjective: Patient feels overall much better today, with better appetite, and less fatigue.  She is still on nasal cannula oxygen 1 to 2 L/min, ate better today.  Her husband is at bedside.   Objective:  Vitals:   06/01/24 1132 06/01/24 1200  BP: (!) 166/57 (!) 186/85  Pulse: 89   Resp: 20 19  Temp: 97.8 F (36.6 C) 97.6 F (36.4 C)  SpO2: 96% 96%    Body mass index is 35.28 kg/m.  Intake/Output Summary (Last 24 hours) at 06/01/2024 1658 Last data filed at 06/01/2024 1548 Gross per 24 hour  Intake 587.09 ml  Output 100 ml  Net 487.09 ml     Sclerae unicteric  Oropharynx clear  No peripheral adenopathy  Lungs clear -- no rales or rhonchi  Heart regular rate and rhythm  Abdomen benign  MSK no focal spinal tenderness, no peripheral edema  Neuro nonfocal    CBG (last 3)  Recent Labs    06/01/24 0750 06/01/24 1124 06/01/24 1646  GLUCAP 99 139* 139*     Labs:  Lab Results  Component Value Date   WBC 9.0 06/01/2024   HGB 9.3 (L) 06/01/2024   HCT 29.9 (L) 06/01/2024   MCV 85.5 06/01/2024   PLT 436 (H) 06/01/2024   NEUTROABS 4.6 05/30/2024    Urine Studies No results for input(s): UHGB, CRYS in the last 72 hours.  Invalid input(s): UACOL, UAPR, USPG, UPH, UTP, UGL, UKET, UBIL, UNIT, UROB, [place], UEPI, UWBC, [place], [place], [place], [place], [place]  Basic Metabolic Panel: Recent Labs  Lab 05/30/24 1330 05/30/24 1528 05/31/24 0353 06/01/24 0455  NA 140 138 138 138  K 3.8 3.5 3.3* 3.5  CL 107 108 108 108  CO2 22 20* 23 22  GLUCOSE 191* 147* 118* 91  BUN 13 13 10 9   CREATININE 1.10* 1.02* 0.82 0.75  CALCIUM  8.2* 8.2* 7.8* 7.9*  MG  --   --  1.4* 1.7   GFR Estimated Creatinine Clearance: 61 mL/min (by C-G formula based on SCr of 0.75 mg/dL). Liver Function Tests: Recent Labs  Lab 05/30/24 1330 05/30/24 1528  AST 19 23   ALT 21 23  ALKPHOS 64 55  BILITOT 0.2 0.3  PROT 6.0* 5.7*  ALBUMIN 3.2* 2.4*   No results for input(s): LIPASE, AMYLASE in the last 168 hours. No results for input(s): AMMONIA in the last 168 hours. Coagulation profile No results for input(s): INR, PROTIME in the last 168 hours.  CBC: Recent Labs  Lab 05/30/24 1330 05/30/24 1528 05/31/24 0353 06/01/24 0455 06/01/24 1400  WBC 8.6 9.1 8.0 9.0  --   NEUTROABS 4.1 4.6  --   --   --   HGB 8.9* 8.4* 7.1* 6.8* 9.3*  HCT 26.7* 26.6* 23.2* 22.4* 29.9*  MCV 81.2 84.7 84.7 85.5  --   PLT 500* 465* 398 436*  --    Cardiac Enzymes: No results for input(s): CKTOTAL, CKMB, CKMBINDEX, TROPONINI in the last 168 hours. BNP: Invalid input(s): POCBNP CBG: Recent Labs  Lab 05/31/24 1528 05/31/24 2133 06/01/24 0750 06/01/24 1124 06/01/24 1646  GLUCAP 117* 117* 99 139* 139*   D-Dimer No results for input(s): DDIMER in the last 72 hours. Hgb A1c Recent Labs    05/31/24 0117  HGBA1C 7.0*   Lipid Profile No results for input(s): CHOL, HDL, LDLCALC, TRIG, CHOLHDL, LDLDIRECT in the last 72 hours. Thyroid  function  studies No results for input(s): TSH, T4TOTAL, T3FREE, THYROIDAB in the last 72 hours.  Invalid input(s): FREET3 Anemia work up Recent Labs    06/01/24 0455  VITAMINB12 1,235*  FOLATE 14.2  FERRITIN 140  TIBC 188*  IRON  11*   Microbiology Recent Results (from the past 240 hours)  Resp panel by RT-PCR (RSV, Flu A&B, Covid) Anterior Nasal Swab     Status: None   Collection Time: 05/30/24  3:32 PM   Specimen: Anterior Nasal Swab  Result Value Ref Range Status   SARS Coronavirus 2 by RT PCR NEGATIVE NEGATIVE Final    Comment: (NOTE) SARS-CoV-2 target nucleic acids are NOT DETECTED.  The SARS-CoV-2 RNA is generally detectable in upper respiratory specimens during the acute phase of infection. The lowest concentration of SARS-CoV-2 viral copies this assay can detect  is 138 copies/mL. A negative result does not preclude SARS-Cov-2 infection and should not be used as the sole basis for treatment or other patient management decisions. A negative result may occur with  improper specimen collection/handling, submission of specimen other than nasopharyngeal swab, presence of viral mutation(s) within the areas targeted by this assay, and inadequate number of viral copies(<138 copies/mL). A negative result must be combined with clinical observations, patient history, and epidemiological information. The expected result is Negative.  Fact Sheet for Patients:  BloggerCourse.com  Fact Sheet for Healthcare Providers:  SeriousBroker.it  This test is no t yet approved or cleared by the [place]  FDA and  has been authorized for detection and/or diagnosis of SARS-CoV-2 by FDA under an Emergency Use Authorization (EUA). This EUA will remain  in effect (meaning this test can be used) for the duration of the COVID-19 declaration under Section 564(b)(1) of the Act, 21 U.S.C.section 360bbb-3(b)(1), unless the authorization is terminated  or revoked sooner.       Influenza A by PCR NEGATIVE NEGATIVE Final   Influenza B by PCR NEGATIVE NEGATIVE Final    Comment: (NOTE) The Xpert Xpress SARS-CoV-2/FLU/RSV plus assay is intended as an aid in the diagnosis of influenza from Nasopharyngeal swab specimens and should not be used as a sole basis for treatment. Nasal washings and aspirates are unacceptable for Xpert Xpress SARS-CoV-2/FLU/RSV testing.  Fact Sheet for Patients: BloggerCourse.com  Fact Sheet for Healthcare Providers: SeriousBroker.it  This test is not yet approved or cleared by the [place]  FDA and has been authorized for detection and/or diagnosis of SARS-CoV-2 by FDA under an Emergency Use Authorization (EUA). This EUA will remain in effect  (meaning this test can be used) for the duration of the COVID-19 declaration under Section 564(b)(1) of the Act, 21 U.S.C. section 360bbb-3(b)(1), unless the authorization is terminated or revoked.     Resp Syncytial Virus by PCR NEGATIVE NEGATIVE Final    Comment: (NOTE) Fact Sheet for Patients: BloggerCourse.com  Fact Sheet for Healthcare Providers: SeriousBroker.it  This test is not yet approved or cleared by the [place]  FDA and has been authorized for detection and/or diagnosis of SARS-CoV-2 by FDA under an Emergency Use Authorization (EUA). This EUA will remain in effect (meaning this test can be used) for the duration of the COVID-19 declaration under Section 564(b)(1) of the Act, 21 U.S.C. section 360bbb-3(b)(1), unless the authorization is terminated or revoked.  Performed at [facility], [address], [place], [place] [address]   MRSA Next Gen by PCR, Nasal     Status: None   Collection Time: 05/31/24  3:10 AM   Specimen: Nasal Mucosa;  Nasal Swab  Result Value Ref Range Status   MRSA by PCR Next Gen NOT DETECTED NOT DETECTED Final    Comment: (NOTE) The GeneXpert MRSA Assay (FDA approved for NASAL specimens only), is one component of a comprehensive MRSA colonization surveillance program. It is not intended to diagnose MRSA infection nor to guide or monitor treatment for MRSA infections. Test performance is not FDA approved in patients less than 69 years old. Performed at [facility], [address], [place], [place] [address]       Studies:  ECHOCARDIOGRAM COMPLETE Result Date: 06/01/2024    ECHOCARDIOGRAM REPORT   Patient Name:   FRANKI ALCAIDE Date of Exam: 05/31/2024 Medical Rec #:  [id number]    Height:       61.0 in Accession #:    [id number]   Weight:       186.7 lb Date of Birth:  1951/03/23    BSA:          1.834 m Patient Age:    73 years     BP:            105/44 mmHg Patient Gender: F            HR:           88 bpm. Exam Location:  Inpatient Procedure: 2D Echo, Color Doppler and Cardiac Doppler (Both Spectral and Color            Flow Doppler were utilized during procedure). Indications:    Pulmonary embolus  History:        Patient has prior history of Echocardiogram examinations, most                 recent 02/19/2024. Pulmonary embolism.  Sonographer:    Benard Stallion Referring Phys: [id number] PROSPER M AMPONSAH IMPRESSIONS  1. Left ventricular ejection fraction, by estimation, is 65 to 70%. The left ventricle has normal function. The left ventricle has no regional wall motion abnormalities. There is mild concentric left ventricular hypertrophy. Left ventricular diastolic parameters were normal. There is the interventricular septum is flattened in systole, consistent with right ventricular pressure overload.  2. McConnell's sign present (RV dysfunction with apical sparing) suggestive of acute PE. Right ventricular systolic function is moderately reduced. The right ventricular size is moderately enlarged. There is mildly elevated pulmonary artery systolic pressure. The estimated right ventricular systolic pressure is 41.9 mmHg.  3. Right atrial size was moderately dilated.  4. The mitral valve is normal in structure. No evidence of mitral valve regurgitation. No evidence of mitral stenosis.  5. The aortic valve is bicuspid. There is mild calcification of the aortic valve. Aortic valve regurgitation is not visualized. Aortic valve sclerosis/calcification is present, without any evidence of aortic stenosis.  6. The inferior vena cava is normal in size with greater than 50% respiratory variability, suggesting right atrial pressure of 3 mmHg. FINDINGS  Left Ventricle: Left ventricular ejection fraction, by estimation, is 65 to 70%. The left ventricle has normal function. The left ventricle has no regional wall motion abnormalities. The left ventricular internal cavity  size was normal in size. There is  mild concentric left ventricular hypertrophy. The interventricular septum is flattened in systole, consistent with right ventricular pressure overload. Left ventricular diastolic parameters were normal. Right Ventricle: McConnell's sign present (RV dysfunction with apical sparing) suggestive of acute PE. The right ventricular size is moderately enlarged. No increase in right ventricular wall thickness. Right ventricular systolic function is moderately reduced.  There is mildly elevated pulmonary artery systolic pressure. The tricuspid regurgitant velocity is 3.12 m/s, and with an assumed right atrial pressure of 3 mmHg, the estimated right ventricular systolic pressure is 41.9 mmHg. Left Atrium: Left atrial size was normal in size. Right Atrium: Right atrial size was moderately dilated. Pericardium: Trivial pericardial effusion is present. The pericardial effusion is posterior to the left ventricle. Mitral Valve: The mitral valve is normal in structure. No evidence of mitral valve regurgitation. No evidence of mitral valve stenosis. Tricuspid Valve: The tricuspid valve is normal in structure. Tricuspid valve regurgitation is mild . No evidence of tricuspid stenosis. Aortic Valve: The aortic valve is bicuspid. There is mild calcification of the aortic valve. Aortic valve regurgitation is not visualized. Aortic valve sclerosis/calcification is present, without any evidence of aortic stenosis. Aortic valve mean gradient measures 5.0 mmHg. Aortic valve peak gradient measures 10.2 mmHg. Aortic valve area, by VTI measures 1.52 cm. Pulmonic Valve: The pulmonic valve was normal in structure. Pulmonic valve regurgitation is trivial. No evidence of pulmonic stenosis. Aorta: The aortic root is normal in size and structure. Venous: The inferior vena cava is normal in size with greater than 50% respiratory variability, suggesting right atrial pressure of 3 mmHg. IAS/Shunts: No atrial level  shunt detected by color flow Doppler.  LEFT VENTRICLE PLAX 2D LVIDd:         3.90 cm   Diastology LVIDs:         2.40 cm   LV e' medial:    7.72 cm/s LV PW:         1.10 cm   LV E/e' medial:  13.5 LV IVS:        1.00 cm   LV e' lateral:   8.16 cm/s LVOT diam:     1.80 cm   LV E/e' lateral: 12.7 LV SV:         42 LV SV Index:   23 LVOT Area:     2.54 cm  RIGHT VENTRICLE RV S prime:     15.90 cm/s TAPSE (M-mode): 2.7 cm LEFT ATRIUM             Index        RIGHT ATRIUM           Index LA diam:        3.30 cm 1.80 cm/m   RA Area:     16.20 cm LA Vol (A2C):   41.6 ml 22.68 ml/m  RA Volume:   44.20 ml  24.09 ml/m LA Vol (A4C):   30.5 ml 16.63 ml/m LA Biplane Vol: 38.2 ml 20.82 ml/m  AORTIC VALVE AV Area (Vmax):    1.46 cm AV Area (Vmean):   1.46 cm AV Area (VTI):     1.52 cm AV Vmax:           160.00 cm/s AV Vmean:          106.000 cm/s AV VTI:            0.276 m AV Peak Grad:      10.2 mmHg AV Mean Grad:      5.0 mmHg LVOT Vmax:         92.10 cm/s LVOT Vmean:        60.800 cm/s LVOT VTI:          0.165 m LVOT/AV VTI ratio: 0.60  AORTA Ao Root diam: 2.60 cm Ao Asc diam:  3.10 cm MITRAL VALVE  TRICUSPID VALVE MV Area (PHT): 4.41 cm     TR Peak grad:   38.9 mmHg MV Decel Time: 172 msec     TR Vmax:        312.00 cm/s MV E velocity: 104.00 cm/s MV A velocity: 106.00 cm/s  SHUNTS MV E/A ratio:  0.98         Systemic VTI:  0.16 m                             Systemic Diam: 1.80 cm Toribio Fuel MD Electronically signed by Toribio Fuel MD Signature Date/Time: 06/01/2024/6:43:11 AM    Final    VAS US  LOWER EXTREMITY VENOUS (DVT) Result Date: 05/31/2024  Lower Venous DVT Study Patient Name:  AMIL MOSEMAN  Date of Exam:   05/31/2024 Medical Rec #: [id number]     Accession #:    [id number] Date of Birth: [birth year]     Patient Gender: F Patient Age:   71 years Exam Location:  [facility] Procedure:      VAS US  LOWER EXTREMITY VENOUS (DVT) Referring Phys: PROSPER AMPONSAH  --------------------------------------------------------------------------------  Indications: Pulmonary embolism.  Risk Factors: Breast cancer, on chemotherapy. Limitations: Poor ultrasound/tissue interface and calcific shadowing. Comparison Study: No previous exams Performing Technologist: Jody Hill RVT, RDMS  Examination Guidelines: A complete evaluation includes B-mode imaging, spectral Doppler, color Doppler, and power Doppler as needed of all accessible portions of each vessel. Bilateral testing is considered an integral part of a complete examination. Limited examinations for reoccurring indications may be performed as noted. The reflux portion of the exam is performed with the patient in reverse Trendelenburg.  +---------+---------------+---------+-----------+----------+-------------------+ RIGHT    CompressibilityPhasicitySpontaneityPropertiesThrombus Aging      +---------+---------------+---------+-----------+----------+-------------------+ CFV      Full           No       Yes                                      +---------+---------------+---------+-----------+----------+-------------------+ SFJ      Full                                                             +---------+---------------+---------+-----------+----------+-------------------+ FV Prox  Full           No       Yes                                      +---------+---------------+---------+-----------+----------+-------------------+ FV Mid   Full           No       Yes                                      +---------+---------------+---------+-----------+----------+-------------------+ FV DistalFull           No       Yes                                      +---------+---------------+---------+-----------+----------+-------------------+  PFV      Full                                         Not well visualized +---------+---------------+---------+-----------+----------+-------------------+ POP       Full           No       Yes                                      +---------+---------------+---------+-----------+----------+-------------------+ PTV      Partial        No       No                   Age Indeterminate   +---------+---------------+---------+-----------+----------+-------------------+ PERO     Full                                                             +---------+---------------+---------+-----------+----------+-------------------+ Pulsatile doppler waveforms seen in lower extemity  +---------+---------------+---------+-----------+----------+-------------------+ LEFT     CompressibilityPhasicitySpontaneityPropertiesThrombus Aging      +---------+---------------+---------+-----------+----------+-------------------+ CFV      Full           No       Yes                                      +---------+---------------+---------+-----------+----------+-------------------+ SFJ      Full                                                             +---------+---------------+---------+-----------+----------+-------------------+ FV Prox  Full           Yes      Yes                                      +---------+---------------+---------+-----------+----------+-------------------+ FV Mid   Full           Yes      Yes                                      +---------+---------------+---------+-----------+----------+-------------------+ FV DistalFull           Yes      Yes                                      +---------+---------------+---------+-----------+----------+-------------------+ PFV      Full                                                             +---------+---------------+---------+-----------+----------+-------------------+  POP      Full           Yes      Yes                                      +---------+---------------+---------+-----------+----------+-------------------+ PTV      Partial        No       No                    age indeterminate -                                                       one of paired       +---------+---------------+---------+-----------+----------+-------------------+ PERO     None           No       No                   Age Indeterminate   +---------+---------------+---------+-----------+----------+-------------------+     Summary: RIGHT: - Findings consistent with age indeterminate deep vein thrombosis involving the right posterior tibial veins.   LEFT: - Findings consistent with age indeterminate deep vein thrombosis involving the left posterior tibial veins, and left peroneal veins.   *See table(s) above for measurements and observations. Electronically signed by Norman Serve on 05/31/2024 at 2:58:28 PM.    Final    CT Angio Chest PE W and/or Wo Contrast Result Date: 05/30/2024 CLINICAL DATA:  Tachycardia and chest pain EXAM: CT ANGIOGRAPHY CHEST WITH CONTRAST TECHNIQUE: Multidetector CT imaging of the chest was performed using the standard protocol during bolus administration of intravenous contrast. Multiplanar CT image reconstructions and MIPs were obtained to evaluate the vascular anatomy. RADIATION DOSE REDUCTION: This exam was performed according to the departmental dose-optimization program which includes automated exposure control, adjustment of the mA and/or kV according to patient size and/or use of iterative reconstruction technique. CONTRAST:  75mL OMNIPAQUE  IOHEXOL  350 MG/ML SOLN COMPARISON:  Chest x-ray from earlier in the same day FINDINGS: Cardiovascular: Atherosclerotic calcifications of the thoracic aorta are noted. No aneurysmal dilatation or dissection is seen. The heart is mildly enlarged. Coronary calcifications are noted. The pulmonary artery shows a normal branching pattern bilaterally. Scattered filling defects are noted bilaterally in segmental and subsegmental pulmonary arterial branches. RV/LV ratio of 1.4 is noted consistent with right  heart strain. Mediastinum/Nodes: Thoracic inlet is within normal limits. No hilar or mediastinal adenopathy is noted. The esophagus as visualized is within normal limits. Lungs/Pleura: Mosaic attenuation is noted throughout both lungs consistent with patchy air trapping. Mild atelectatic changes are noted in the left posterior costophrenic angle. No sizable parenchymal nodule is seen. Upper Abdomen: No acute abnormality. Musculoskeletal: Degenerative changes of the thoracic spine are noted. No acute rib abnormality is noted. Review of the MIP images confirms the above findings. IMPRESSION: Positive for acute PE with CT evidence of right heart strain (RV/LV Ratio = 1.4) consistent with at least submassive (intermediate risk) PE. The presence of right heart strain has been associated with an increased risk of morbidity and mortality. Please refer to the Code PE Focused order set in EPIC. No other focal abnormality is noted. Aortic Atherosclerosis (ICD10-I70.0). Critical Value/emergent results were  called by telephone at the time of interpretation on 05/30/2024 at 7:13 pm to Dr. SID BONING , who verbally acknowledged these results. Electronically Signed   By: Oneil Devonshire M.D.   On: 05/30/2024 19:16    Assessment: 73 y.o.  1.  Submassive PE with right heart strain 2.  Acute hypoxia respiratory failure 3. elevated troponin, likely demand ischemia 4.  Breast cancer, on [place] , patient declined surgery. 5. anemia required blood transfusion, likely secondary to anticoagulation and previous chemo   Plan:  - Patient is clinically responding to anticoagulation, her overall symptom has improved, still required low-level oxygen. - Okay to change heparin  infusion to DOAC such as Eliquis  with loading dose.  I recommend lifelong anticoagulation due to her underlying malignancy. - Other management per the primary team. -I will monitor her blood counts and iron  level closely when she is on anticoagulation - Plan  to see her back in 2 to 3 weeks after hospital discharge  Onita Mattock, MD 06/01/2024  4:58 PM

## 2024-06-01 NOTE — Progress Notes (Signed)
   06/01/24 1534  TOC Brief Assessment  Insurance and Status Reviewed  Patient has primary care physician Yes (McDiarmid, Krystal BIRCH, MD)  Home environment has been reviewed Single family home  Prior level of function: Independent with ADL's  Prior/Current Home Services No current home services  Social Drivers of Health Review SDOH reviewed no interventions necessary  Readmission risk has been reviewed Yes  Transition of care needs transition of care needs identified, TOC will continue to follow   Pt screened. Currently requiring oxygen. Care Management will follow for home O2 needs or any new recommendations.

## 2024-06-01 NOTE — Progress Notes (Signed)
 Referring Physician(s): Dr. Dorn Chill  Supervising Physician: Luverne Aran  Patient Status:  [facility] - In-pt  Chief Complaint: Pulmonary embolus  Subjective: Patient resting comfortably in bed, on 2L [place].  Getting blood for Hgb dropfrom 8.9 to 7.1.  Tolerating heparin  gtt. Trops declining, now 103.  Reviewed PE lysis which was offered to patient yesterday.  She continues to states she is hesitant and would prefer to avoid procedure if possible.  All questions answered.   Allergies: Januvia  Appia.Barns ], Jardiance  [empagliflozin ], Celecoxib, and Diclofenac-misoprostol  Medications: Prior to Admission medications   Medication Sig Start Date End Date Taking? Authorizing Provider  amLODipine -benazepril  (LOTREL) 10-20 MG capsule TAKE 1 CAPSULE BY MOUTH DAILY 01/28/24  Yes McDiarmid, Krystal BIRCH, MD  carvedilol  (COREG ) 6.25 MG tablet TAKE 1 TABLET(6.25 MG) BY MOUTH TWICE DAILY WITH A MEAL 11/18/23  Yes McDiarmid, Krystal BIRCH, MD  diphenoxylate -atropine  (LOMOTIL ) 2.5-0.025 MG tablet Take 1-2 tablets by mouth 4 (four) times daily as needed for diarrhea or loose stools. 05/09/24  Yes Lanny Callander, MD  fexofenadine  (ALLEGRA ) 180 MG tablet Take 180 mg by mouth daily as needed for allergies or rhinitis.   Yes [provider]  gabapentin  (NEURONTIN ) 100 MG capsule TAKE 1 CAPSULE(100 MG) BY MOUTH THREE TIMES DAILY AS NEEDED Patient taking differently: Take 100 mg by mouth 3 (three) times daily as needed (for neuropathy). 04/28/24  Yes McDiarmid, Krystal BIRCH, MD  hydrochlorothiazide  (HYDRODIURIL ) 25 MG tablet TAKE 1 TABLET(25 MG) BY MOUTH DAILY 02/07/24  Yes McDiarmid, Krystal BIRCH, MD  HYDROcodone -acetaminophen  (NORCO) 7.5-325 MG tablet Take 1 tablet by mouth every 8 (eight) hours as needed for moderate pain (pain score 4-6). Patient taking differently: Take 1 tablet by mouth See admin instructions. Take 1 tablet by mouth two to three times a day 04/05/24 05/30/24 Yes McDiarmid, Krystal BIRCH, MD   lidocaine -prilocaine  (EMLA ) cream Apply 1 Application topically daily as needed. Use as directed for port access Patient taking differently: Apply 1 Application topically daily as needed (as directed for port access). 04/04/24  Yes Boscia, Heather E, NP  metFORMIN  (GLUCOPHAGE -XR) 500 MG 24 hr tablet TAKE 1 TABLET(500 MG) BY MOUTH TWICE DAILY WITH A MEAL 08/09/23  Yes McDiarmid, Krystal BIRCH, MD  methylphenidate  (RITALIN ) 10 MG tablet Take 2 tablets (20 mg total) by mouth 3 (three) times daily with meals. Patient taking differently: Take 20 mg by mouth See admin instructions. Take 20 mg by mouth two to three times a day with meals 04/05/24 05/30/24 Yes McDiarmid, Krystal BIRCH, MD  naproxen  (NAPROSYN ) 500 MG tablet TAKE 1 TABLET(500 MG) BY MOUTH TWICE DAILY WITH A MEAL Patient taking differently: Take 500 mg by mouth 2 (two) times daily as needed (for pain- take with food). 04/28/24  Yes McDiarmid, Krystal BIRCH, MD  ondansetron  (ZOFRAN ) 8 MG tablet Take 1 tablet (8 mg total) by mouth every 8 (eight) hours as needed for nausea or vomiting. Start on the third day after chemotherapy. 03/10/24  Yes Lanny Callander, MD  Prenat-Fe Carbonyl-FA-Omega 3 (ONE-A-DAY WOMENS PRENATAL 1) 28-0.8-235 MG CAPS Take 1 capsule by mouth daily with breakfast.   Yes [provider]  prochlorperazine  (COMPAZINE ) 10 MG tablet Take 1 tablet (10 mg total) by mouth every 6 (six) hours as needed for nausea or vomiting. 03/10/24  Yes Lanny Callander, MD  atorvastatin  (LIPITOR) 40 MG tablet Take 1 tablet (40 mg total) by mouth daily. Patient not taking: Reported on 05/30/2024 05/11/19   McDiarmid, Krystal BIRCH, MD  blood glucose meter kit  and supplies KIT Dispense based on patient and insurance preference. Use up to four times daily as directed. 12/01/22   Christia Budds, MD  Blood Glucose Monitoring Suppl DEVI 1 each by Does not apply route in the morning, at noon, and at bedtime. May substitute to any manufacturer covered by patient's insurance. 12/01/22   Christia Budds, MD  vitamin B-12 (CYANOCOBALAMIN) 1000 MCG tablet Take 1 tablet (1,000 mcg total) by mouth daily. Patient not taking: Reported on 05/30/2024 05/11/19   McDiarmid, Krystal BIRCH, MD     Vital Signs: BP (!) 153/54   Pulse 92   Temp 97.8 F (36.6 C)   Resp 18   Ht 5' 1 (1.549 m)   Wt 186 lb 11.7 oz (84.7 kg)   SpO2 96%   BMI 35.28 kg/m   Physical Exam NAD, alert.  Stable on 2L [place].  Chest: no increased WOB at rest.  CTAB Abdomen: soft.    Imaging: ECHOCARDIOGRAM COMPLETE Result Date: 06/01/2024    ECHOCARDIOGRAM REPORT   Patient Name:   [name] Date of Exam: 05/31/2024 Medical Rec #:  [id number]    Height:       61.0 in Accession #:    [id number]   Weight:       186.7 lb Date of Birth:  May 19, 1951    BSA:          1.834 m Patient Age:    73 years     BP:           105/44 mmHg Patient Gender: F            HR:           88 bpm. Exam Location:  Inpatient Procedure: 2D Echo, Color Doppler and Cardiac Doppler (Both Spectral and Color            Flow Doppler were utilized during procedure). Indications:    Pulmonary embolus  History:        Patient has prior history of Echocardiogram examinations, most                 recent 02/19/2024. Pulmonary embolism.  Sonographer:    Benard Stallion Referring Phys: [id number] PROSPER M AMPONSAH IMPRESSIONS  1. Left ventricular ejection fraction, by estimation, is 65 to 70%. The left ventricle has normal function. The left ventricle has no regional wall motion abnormalities. There is mild concentric left ventricular hypertrophy. Left ventricular diastolic parameters were normal. There is the interventricular septum is flattened in systole, consistent with right ventricular pressure overload.  2. McConnell's sign present (RV dysfunction with apical sparing) suggestive of acute PE. Right ventricular systolic function is moderately reduced. The right ventricular size is moderately enlarged. There is mildly elevated pulmonary artery systolic pressure. The estimated  right ventricular systolic pressure is 41.9 mmHg.  3. Right atrial size was moderately dilated.  4. The mitral valve is normal in structure. No evidence of mitral valve regurgitation. No evidence of mitral stenosis.  5. The aortic valve is bicuspid. There is mild calcification of the aortic valve. Aortic valve regurgitation is not visualized. Aortic valve sclerosis/calcification is present, without any evidence of aortic stenosis.  6. The inferior vena cava is normal in size with greater than 50% respiratory variability, suggesting right atrial pressure of 3 mmHg. FINDINGS  Left Ventricle: Left ventricular ejection fraction, by estimation, is 65 to 70%. The left ventricle has normal function. The left ventricle has no regional wall motion abnormalities. The left ventricular internal  cavity size was normal in size. There is  mild concentric left ventricular hypertrophy. The interventricular septum is flattened in systole, consistent with right ventricular pressure overload. Left ventricular diastolic parameters were normal. Right Ventricle: McConnell's sign present (RV dysfunction with apical sparing) suggestive of acute PE. The right ventricular size is moderately enlarged. No increase in right ventricular wall thickness. Right ventricular systolic function is moderately reduced. There is mildly elevated pulmonary artery systolic pressure. The tricuspid regurgitant velocity is 3.12 m/s, and with an assumed right atrial pressure of 3 mmHg, the estimated right ventricular systolic pressure is 41.9 mmHg. Left Atrium: Left atrial size was normal in size. Right Atrium: Right atrial size was moderately dilated. Pericardium: Trivial pericardial effusion is present. The pericardial effusion is posterior to the left ventricle. Mitral Valve: The mitral valve is normal in structure. No evidence of mitral valve regurgitation. No evidence of mitral valve stenosis. Tricuspid Valve: The tricuspid valve is normal in structure.  Tricuspid valve regurgitation is mild . No evidence of tricuspid stenosis. Aortic Valve: The aortic valve is bicuspid. There is mild calcification of the aortic valve. Aortic valve regurgitation is not visualized. Aortic valve sclerosis/calcification is present, without any evidence of aortic stenosis. Aortic valve mean gradient measures 5.0 mmHg. Aortic valve peak gradient measures 10.2 mmHg. Aortic valve area, by VTI measures 1.52 cm. Pulmonic Valve: The pulmonic valve was normal in structure. Pulmonic valve regurgitation is trivial. No evidence of pulmonic stenosis. Aorta: The aortic root is normal in size and structure. Venous: The inferior vena cava is normal in size with greater than 50% respiratory variability, suggesting right atrial pressure of 3 mmHg. IAS/Shunts: No atrial level shunt detected by color flow Doppler.  LEFT VENTRICLE PLAX 2D LVIDd:         3.90 cm   Diastology LVIDs:         2.40 cm   LV e' medial:    7.72 cm/s LV PW:         1.10 cm   LV E/e' medial:  13.5 LV IVS:        1.00 cm   LV e' lateral:   8.16 cm/s LVOT diam:     1.80 cm   LV E/e' lateral: 12.7 LV SV:         42 LV SV Index:   23 LVOT Area:     2.54 cm  RIGHT VENTRICLE RV S prime:     15.90 cm/s TAPSE (M-mode): 2.7 cm LEFT ATRIUM             Index        RIGHT ATRIUM           Index LA diam:        3.30 cm 1.80 cm/m   RA Area:     16.20 cm LA Vol (A2C):   41.6 ml 22.68 ml/m  RA Volume:   44.20 ml  24.09 ml/m LA Vol (A4C):   30.5 ml 16.63 ml/m LA Biplane Vol: 38.2 ml 20.82 ml/m  AORTIC VALVE AV Area (Vmax):    1.46 cm AV Area (Vmean):   1.46 cm AV Area (VTI):     1.52 cm AV Vmax:           160.00 cm/s AV Vmean:          106.000 cm/s AV VTI:            0.276 m AV Peak Grad:      10.2 mmHg AV Mean Grad:  5.0 mmHg LVOT Vmax:         92.10 cm/s LVOT Vmean:        60.800 cm/s LVOT VTI:          0.165 m LVOT/AV VTI ratio: 0.60  AORTA Ao Root diam: 2.60 cm Ao Asc diam:  3.10 cm MITRAL VALVE                TRICUSPID VALVE MV  Area (PHT): 4.41 cm     TR Peak grad:   38.9 mmHg MV Decel Time: 172 msec     TR Vmax:        312.00 cm/s MV E velocity: 104.00 cm/s MV A velocity: 106.00 cm/s  SHUNTS MV E/A ratio:  0.98         Systemic VTI:  0.16 m                             Systemic Diam: 1.80 cm Toribio Fuel MD Electronically signed by Toribio Fuel MD Signature Date/Time: 06/01/2024/6:43:11 AM    Final    VAS US  LOWER EXTREMITY VENOUS (DVT) Result Date: 05/31/2024  Lower Venous DVT Study Patient Name:  [name]  Date of Exam:   05/31/2024 Medical Rec #: [id number]     Accession #:    [id number] Date of Birth: 1951/08/17     Patient Gender: F Patient Age:   42 years Exam Location:  [facility] Procedure:      VAS US  LOWER EXTREMITY VENOUS (DVT) Referring Phys: PROSPER AMPONSAH --------------------------------------------------------------------------------  Indications: Pulmonary embolism.  Risk Factors: Breast cancer, on chemotherapy. Limitations: Poor ultrasound/tissue interface and calcific shadowing. Comparison Study: No previous exams Performing Technologist: Jody Hill RVT, RDMS  Examination Guidelines: A complete evaluation includes B-mode imaging, spectral Doppler, color Doppler, and power Doppler as needed of all accessible portions of each vessel. Bilateral testing is considered an integral part of a complete examination. Limited examinations for reoccurring indications may be performed as noted. The reflux portion of the exam is performed with the patient in reverse Trendelenburg.  +---------+---------------+---------+-----------+----------+-------------------+ RIGHT    CompressibilityPhasicitySpontaneityPropertiesThrombus Aging      +---------+---------------+---------+-----------+----------+-------------------+ CFV      Full           No       Yes                                      +---------+---------------+---------+-----------+----------+-------------------+ SFJ      Full                                                              +---------+---------------+---------+-----------+----------+-------------------+ FV Prox  Full           No       Yes                                      +---------+---------------+---------+-----------+----------+-------------------+ FV Mid   Full           No       Yes                                      +---------+---------------+---------+-----------+----------+-------------------+  FV DistalFull           No       Yes                                      +---------+---------------+---------+-----------+----------+-------------------+ PFV      Full                                         Not well visualized +---------+---------------+---------+-----------+----------+-------------------+ POP      Full           No       Yes                                      +---------+---------------+---------+-----------+----------+-------------------+ PTV      Partial        No       No                   Age Indeterminate   +---------+---------------+---------+-----------+----------+-------------------+ PERO     Full                                                             +---------+---------------+---------+-----------+----------+-------------------+ Pulsatile doppler waveforms seen in lower extemity  +---------+---------------+---------+-----------+----------+-------------------+ LEFT     CompressibilityPhasicitySpontaneityPropertiesThrombus Aging      +---------+---------------+---------+-----------+----------+-------------------+ CFV      Full           No       Yes                                      +---------+---------------+---------+-----------+----------+-------------------+ SFJ      Full                                                             +---------+---------------+---------+-----------+----------+-------------------+ FV Prox  Full           Yes      Yes                                       +---------+---------------+---------+-----------+----------+-------------------+ FV Mid   Full           Yes      Yes                                      +---------+---------------+---------+-----------+----------+-------------------+ FV DistalFull           Yes      Yes                                      +---------+---------------+---------+-----------+----------+-------------------+  PFV      Full                                                             +---------+---------------+---------+-----------+----------+-------------------+ POP      Full           Yes      Yes                                      +---------+---------------+---------+-----------+----------+-------------------+ PTV      Partial        No       No                   age indeterminate -                                                       one of paired       +---------+---------------+---------+-----------+----------+-------------------+ PERO     None           No       No                   Age Indeterminate   +---------+---------------+---------+-----------+----------+-------------------+     Summary: RIGHT: - Findings consistent with age indeterminate deep vein thrombosis involving the right posterior tibial veins.   LEFT: - Findings consistent with age indeterminate deep vein thrombosis involving the left posterior tibial veins, and left peroneal veins.   *See table(s) above for measurements and observations. Electronically signed by Norman Serve on 05/31/2024 at 2:58:28 PM.    Final    CT Angio Chest PE W and/or Wo Contrast Result Date: 05/30/2024 CLINICAL DATA:  Tachycardia and chest pain EXAM: CT ANGIOGRAPHY CHEST WITH CONTRAST TECHNIQUE: Multidetector CT imaging of the chest was performed using the standard protocol during bolus administration of intravenous contrast. Multiplanar CT image reconstructions and MIPs were obtained to evaluate the vascular anatomy. RADIATION  DOSE REDUCTION: This exam was performed according to the departmental dose-optimization program which includes automated exposure control, adjustment of the mA and/or kV according to patient size and/or use of iterative reconstruction technique. CONTRAST:  75mL OMNIPAQUE  IOHEXOL  350 MG/ML SOLN COMPARISON:  Chest x-ray from earlier in the same day FINDINGS: Cardiovascular: Atherosclerotic calcifications of the thoracic aorta are noted. No aneurysmal dilatation or dissection is seen. The heart is mildly enlarged. Coronary calcifications are noted. The pulmonary artery shows a normal branching pattern bilaterally. Scattered filling defects are noted bilaterally in segmental and subsegmental pulmonary arterial branches. RV/LV ratio of 1.4 is noted consistent with right heart strain. Mediastinum/Nodes: Thoracic inlet is within normal limits. No hilar or mediastinal adenopathy is noted. The esophagus as visualized is within normal limits. Lungs/Pleura: Mosaic attenuation is noted throughout both lungs consistent with patchy air trapping. Mild atelectatic changes are noted in the left posterior costophrenic angle. No sizable parenchymal nodule is seen. Upper Abdomen: No acute abnormality. Musculoskeletal: Degenerative changes of the thoracic spine are noted. No acute rib abnormality is noted. Review of the MIP images confirms the  above findings. IMPRESSION: Positive for acute PE with CT evidence of right heart strain (RV/LV Ratio = 1.4) consistent with at least submassive (intermediate risk) PE. The presence of right heart strain has been associated with an increased risk of morbidity and mortality. Please refer to the Code PE Focused order set in EPIC. No other focal abnormality is noted. Aortic Atherosclerosis (ICD10-I70.0). Critical Value/emergent results were called by telephone at the time of interpretation on 05/30/2024 at 7:13 pm to Dr. SID BONING , who verbally acknowledged these results. Electronically Signed    By: Oneil Devonshire M.D.   On: 05/30/2024 19:16   DG Chest Portable 1 View Result Date: 05/30/2024 CLINICAL DATA:  Shortness of breath. History of left breast carcinoma. EXAM: PORTABLE CHEST 1 VIEW COMPARISON:  PET-CT dated 03/20/2024. FINDINGS: The heart size is within normal limits for technique. Mediastinal contours are within normal limits. Right chest Port-A-Cath tip overlies the lower SVC. Aortic atherosclerosis. No focal consolidation, pleural effusion, or pneumothorax. No acute osseous abnormality. IMPRESSION: 1. No acute cardiopulmonary findings. 2.  Aortic Atherosclerosis (ICD10-I70.0). Electronically Signed   By: Harrietta Sherry M.D.   On: 05/30/2024 15:58    Labs:  CBC: Recent Labs    05/30/24 1330 05/30/24 1528 05/31/24 0353 06/01/24 0455  WBC 8.6 9.1 8.0 9.0  HGB 8.9* 8.4* 7.1* 6.8*  HCT 26.7* 26.6* 23.2* 22.4*  PLT 500* 465* 398 436*    COAGS: No results for input(s): INR, APTT in the last 8760 hours.  BMP: Recent Labs    05/30/24 1330 05/30/24 1528 05/31/24 0353 06/01/24 0455  NA 140 138 138 138  K 3.8 3.5 3.3* 3.5  CL 107 108 108 108  CO2 22 20* 23 22  GLUCOSE 191* 147* 118* 91  BUN 13 13 10 9   CALCIUM  8.2* 8.2* 7.8* 7.9*  CREATININE 1.10* 1.02* 0.82 0.75  GFRNONAA 53* 58* >60 >60    LIVER FUNCTION TESTS: Recent Labs    04/19/24 1250 05/09/24 1348 05/30/24 1330 05/30/24 1528  BILITOT 0.1 0.2 0.2 0.3  AST 18 28 19 23   ALT 33 38 21 23  ALKPHOS 52 58 64 55  PROT 5.7* 6.1* 6.0* 5.7*  ALBUMIN 3.0* 3.3* 3.2* 2.4*    Assessment and Plan: Pulmonary embolus Patient remains unchanged on 2L [place].  Her VSS are stable with normal , HR, and RR at rest.   Admittingly patient has not been OOB except to bedside commode but subjectively feeling transition from bed to bedside was less cumbersome.  Overall she remains hesitant to proceed with lysis stating repeatedly I just want to give my body a chance. Stated IR availability of which she is aware.  No  plans for procedure at this time.  Will d/c order-- please consult if needed.  Electronically Signed: Bayyinah Dukeman Sue-Ellen Mardel Grudzien, PA 06/01/2024, 11:07 AM   I spent a total of 15 Minutes at the the patient's bedside AND on the patient's hospital floor or unit, greater than 50% of which was counseling/coordinating care for pulmonary embolus.

## 2024-06-01 NOTE — Progress Notes (Signed)
 NAME:  [name], MRN:  [id number], DOB:  07-31-1951, LOS: 2 ADMISSION DATE:  05/30/2024, CONSULTATION DATE:  05/31/24 REFERRING MD:  Mugweru , CHIEF COMPLAINT:  PE   History of Present Illness:  73 yo F PMH breast cancer currently on Enhertu , DM who was admitted to [facility] 8/12 after presenting to ED from her oncology appointment for eval for SOB and weakness. She was found to have bilateral segmental and subsegmental PE, with RV/LV ration 1.4, concerning for R heart strain. Her BNP was elevated 370 and trop 103. She was started on a hep gtt. Trops subsequently have down trended to 77  IR was asked to evaluate the patient, and recommended PCCM consultation 05/31/24 to help decide course of action   Pertinent  Medical History  Breast cancer DM   Significant Hospital Events: Including procedures, antibiotic start and stop dates in addition to other pertinent events   8/12 ED at referral of onc office for SOB eval. Found to have bilat segmental/subsegmental PE. Hep gtt and admitted to [facility]  8/13 IR consulted, possible catheter directed lytics candidate. PCCM consulted   Interim History / Subjective:  To be transfused 1u PRBC for Hgb 6.8 hemodynamically stable on RA >2L  Objective    Blood pressure (!) 140/51, pulse 81, temperature 98.1 F (36.7 C), temperature source Oral, resp. rate 16, height 5' 1 (1.549 m), weight 84.7 kg, SpO2 98%.        Intake/Output Summary (Last 24 hours) at 06/01/2024 0842 Last data filed at 06/01/2024 0040 Gross per 24 hour  Intake 278.26 ml  Output 400 ml  Net -121.74 ml   Filed Weights   05/30/24 1502 05/30/24 2200  Weight: 85.3 kg 84.7 kg   Examination: General:  pleasant chronically ill older female lying in bed in NAD Neuro: Alert, appropriate, AME CV: rr, no murmur, port right chest accessed PULM:  non labored, clear anteriorly GI: soft, +bs, NT Extremities: warm/dry, trace ankle edema  Skin: no rashes  Labs> H/H 7.1/ 23> 6.8/ 22  8/13  TTE> LVEF 65-70%, no RWMA, interventricular spetum flattened in systole c/w RV pressure overload, +McConnells sign, mod reduced RV, mildly elevated PASP 41.9   Resolved problem list   Assessment and Plan   Submassive PE  // BLE DVT  AoC normocytic anemia Breast cancer on Enhertu   - PESI 104 -- Class III, intermediate risk  - bilat segmental and subsegmental clot. C/f R heart strain on CT. Elevated trops, BNP  - not amenable to thrombectomy, but is candidate for IR catheter directed lytics  P  - echo as above, moderate RV dysfunction noted but pt remains hemodynamically stable on RA > 2L [place], goal sat > 92%.  Appreciate IR consult.  Pt continues to only want IR intervention if absolutely necessary, therefore will cont heparin  gtt at this time, likely transition to [facility] 8/15 pending Hgb trajectory - H/H 7.1/ 23> 6.8/ 22.4,  to be transfused 1u PRBC today.  No evidence of bleeding. Cont to monitor  - avoid strenuous activity for at least 2 weeks - will schedule clinic follow-up and will need repeat echo in 3 months   Nothing further to add.  PCCM will sign off.  Please call us  back if we can be of any further assistance.   Best Practice (right click and Reselect all SmartList Selections daily)  Per primary   Labs   CBC: Recent Labs  Lab 05/30/24 1330 05/30/24 1528 05/31/24 0353 06/01/24 0455  WBC 8.6  9.1 8.0 9.0  NEUTROABS 4.1 4.6  --   --   HGB 8.9* 8.4* 7.1* 6.8*  HCT 26.7* 26.6* 23.2* 22.4*  MCV 81.2 84.7 84.7 85.5  PLT 500* 465* 398 436*    Basic Metabolic Panel: Recent Labs  Lab 05/30/24 1330 05/30/24 1528 05/31/24 0353 06/01/24 0455  NA 140 138 138 138  K 3.8 3.5 3.3* 3.5  CL 107 108 108 108  CO2 22 20* 23 22  GLUCOSE 191* 147* 118* 91  BUN 13 13 10 9   CREATININE 1.10* 1.02* 0.82 0.75  CALCIUM  8.2* 8.2* 7.8* 7.9*  MG  --   --  1.4* 1.7   GFR: Estimated Creatinine Clearance: 61 mL/min (by C-G formula based on SCr of 0.75 mg/dL). Recent Labs  Lab  05/30/24 1330 05/30/24 1528 05/31/24 0353 05/31/24 1337 05/31/24 1621 06/01/24 0455  WBC 8.6 9.1 8.0  --   --  9.0  LATICACIDVEN  --   --   --  1.1 0.8  --     Liver Function Tests: Recent Labs  Lab 05/30/24 1330 05/30/24 1528  AST 19 23  ALT 21 23  ALKPHOS 64 55  BILITOT 0.2 0.3  PROT 6.0* 5.7*  ALBUMIN 3.2* 2.4*   No results for input(s): LIPASE, AMYLASE in the last 168 hours. No results for input(s): AMMONIA in the last 168 hours.  ABG No results found for: PHART, PCO2ART, PO2ART, HCO3, TCO2, ACIDBASEDEF, O2SAT   Coagulation Profile: No results for input(s): INR, PROTIME in the last 168 hours.  Cardiac Enzymes: No results for input(s): CKTOTAL, CKMB, CKMBINDEX, TROPONINI in the last 168 hours.  HbA1C: HbA1c, POC (controlled diabetic range)  Date/Time Value Ref Range Status  01/20/2024 09:28 AM 7.1 (A) 0.0 - 7.0 % Final  09/02/2023 02:45 PM 6.8 0.0 - 7.0 % Final   Hgb A1c MFr Bld  Date/Time Value Ref Range Status  05/31/2024 01:17 AM 7.0 (H) 4.8 - 5.6 % Final    Comment:    (NOTE)         Prediabetes: 5.7 - 6.4         Diabetes: >6.4         Glycemic control for adults with diabetes: <7.0     CBG: Recent Labs  Lab 05/31/24 0841 05/31/24 1119 05/31/24 1528 05/31/24 2133 06/01/24 0750  GLUCAP 159* 135* 117* 117* 99   Allergies Allergies  Allergen Reactions   Januvia  [Sitagliptin ] Other (See Comments)    Epigastric abdominal pain   Jardiance  [Empagliflozin ] Other (See Comments)    Lightheadedness, near-syncope, extreme yeast problems   Celecoxib Palpitations   Diclofenac-Misoprostol Palpitations and Other (See Comments)    Arthrotec     Home Medications  Prior to Admission medications   Medication Sig Start Date End Date Taking? Authorizing Provider  amLODipine -benazepril  (LOTREL) 10-20 MG capsule TAKE 1 CAPSULE BY MOUTH DAILY 01/28/24  Yes McDiarmid, Krystal BIRCH, MD  carvedilol  (COREG ) 6.25 MG tablet TAKE 1  TABLET(6.25 MG) BY MOUTH TWICE DAILY WITH A MEAL 11/18/23  Yes McDiarmid, Krystal BIRCH, MD  diphenoxylate -atropine  (LOMOTIL ) 2.5-0.025 MG tablet Take 1-2 tablets by mouth 4 (four) times daily as needed for diarrhea or loose stools. 05/09/24  Yes Lanny Callander, MD  fexofenadine  (ALLEGRA ) 180 MG tablet Take 180 mg by mouth daily as needed for allergies or rhinitis.   Yes [provider]  gabapentin  (NEURONTIN ) 100 MG capsule TAKE 1 CAPSULE(100 MG) BY MOUTH THREE TIMES DAILY AS NEEDED Patient taking differently: Take 100 mg  by mouth 3 (three) times daily as needed (for neuropathy). 04/28/24  Yes McDiarmid, Krystal BIRCH, MD  hydrochlorothiazide  (HYDRODIURIL ) 25 MG tablet TAKE 1 TABLET(25 MG) BY MOUTH DAILY 02/07/24  Yes McDiarmid, Krystal BIRCH, MD  HYDROcodone -acetaminophen  (NORCO) 7.5-325 MG tablet Take 1 tablet by mouth every 8 (eight) hours as needed for moderate pain (pain score 4-6). Patient taking differently: Take 1 tablet by mouth See admin instructions. Take 1 tablet by mouth two to three times a day 04/05/24 05/30/24 Yes McDiarmid, Krystal BIRCH, MD  lidocaine -prilocaine  (EMLA ) cream Apply 1 Application topically daily as needed. Use as directed for port access Patient taking differently: Apply 1 Application topically daily as needed (as directed for port access). 04/04/24  Yes Boscia, Heather E, NP  metFORMIN  (GLUCOPHAGE -XR) 500 MG 24 hr tablet TAKE 1 TABLET(500 MG) BY MOUTH TWICE DAILY WITH A MEAL 08/09/23  Yes McDiarmid, Krystal BIRCH, MD  methylphenidate  (RITALIN ) 10 MG tablet Take 2 tablets (20 mg total) by mouth 3 (three) times daily with meals. Patient taking differently: Take 20 mg by mouth See admin instructions. Take 20 mg by mouth two to three times a day with meals 04/05/24 05/30/24 Yes McDiarmid, Krystal BIRCH, MD  naproxen  (NAPROSYN ) 500 MG tablet TAKE 1 TABLET(500 MG) BY MOUTH TWICE DAILY WITH A MEAL Patient taking differently: Take 500 mg by mouth 2 (two) times daily as needed (for pain- take with food). 04/28/24  Yes  McDiarmid, Krystal BIRCH, MD  ondansetron  (ZOFRAN ) 8 MG tablet Take 1 tablet (8 mg total) by mouth every 8 (eight) hours as needed for nausea or vomiting. Start on the third day after chemotherapy. 03/10/24  Yes Lanny Callander, MD  Prenat-Fe Carbonyl-FA-Omega 3 (ONE-A-DAY WOMENS PRENATAL 1) 28-0.8-235 MG CAPS Take 1 capsule by mouth daily with breakfast.   Yes [provider]  prochlorperazine  (COMPAZINE ) 10 MG tablet Take 1 tablet (10 mg total) by mouth every 6 (six) hours as needed for nausea or vomiting. 03/10/24  Yes Lanny Callander, MD  atorvastatin  (LIPITOR) 40 MG tablet Take 1 tablet (40 mg total) by mouth daily. Patient not taking: Reported on 05/30/2024 05/11/19   McDiarmid, Krystal BIRCH, MD  blood glucose meter kit and supplies KIT Dispense based on patient and insurance preference. Use up to four times daily as directed. 12/01/22   Christia Budds, MD  Blood Glucose Monitoring Suppl DEVI 1 each by Does not apply route in the morning, at noon, and at bedtime. May substitute to any manufacturer covered by patient's insurance. 12/01/22   Christia Budds, MD  vitamin B-12 (CYANOCOBALAMIN) 1000 MCG tablet Take 1 tablet (1,000 mcg total) by mouth daily. Patient not taking: Reported on 05/30/2024 05/11/19   McDiarmid, Krystal BIRCH, MD     Critical care time: n/a       Lyle Pesa, MSN, AG-ACNP-BC [facility] 06/01/2024, 9:00 AM  See Amion for pager If no response to pager , please call [phone] until 7pm After 7:00 pm call Elink  [phone]

## 2024-06-01 NOTE — Progress Notes (Signed)
 PHARMACY - ANTICOAGULATION CONSULT NOTE  Pharmacy Consult for heparin  Indication: pulmonary embolus  Allergies  Allergen Reactions   Januvia  [Sitagliptin ] Other (See Comments)    Epigastric abdominal pain   Jardiance  [Empagliflozin ] Other (See Comments)    Lightheadedness, near-syncope, extreme yeast problems   Celecoxib Palpitations   Diclofenac-Misoprostol Palpitations and Other (See Comments)    Arthrotec    Patient Measurements: Height: 5' 1 (154.9 cm) Weight: 84.7 kg (186 lb 11.7 oz) IBW/kg (Calculated) : 47.8 HEPARIN  DW (KG): 67.2  Vital Signs: Temp: 98.5 F (36.9 C) (08/13 2355) Temp Source: Oral (08/13 2355) BP: 125/47 (08/14 0000) Pulse Rate: 93 (08/14 0000)  Labs: Recent Labs    05/30/24 1330 05/30/24 1528 05/30/24 1528 05/30/24 1742 05/31/24 0117 05/31/24 0353 05/31/24 1525 06/01/24 0049  HGB 8.9* 8.4*  --   --   --  7.1*  --   --   HCT 26.7* 26.6*  --   --   --  23.2*  --   --   PLT 500* 465*  --   --   --  398  --   --   HEPARINUNFRC  --   --   --   --   --  0.29* 0.42 0.46  CREATININE 1.10* 1.02*  --   --   --  0.82  --   --   TROPONINIHS  --  103*   < > 111* 93* 77*  --   --    < > = values in this interval not displayed.    Estimated Creatinine Clearance: 59.5 mL/min (by C-G formula based on SCr of 0.82 mg/dL).   Medical History: Past Medical History:  Diagnosis Date   Acute MEE (middle ear effusion) 06/07/2014   Acute recurrent sinusitis 12/21/2014   Acute sinusitis 12/21/2014   Allergy    Anemia    hx   Asthma, intermittent 11/02/2018   ATTENTION DEFICIT, W/O HYPERACTIVITY 12/16/2006   Bilateral carpal tunnel syndrome 11/20/2016   Cataract    Cervical spondylosis with myelopathy, History of 04/28/2012   COLON POLYP 12/16/2006   (02/03/12) Surgical [Pathology], sigmoid colon, polyp HYPERPLASTIC POLYP(S) AND POLYPOID FRAGMETNS OF BENIGN COLONIC MUCOSA WITH INTRAMUCOSAL LYMPHOID AGGREGATES. NO ADENOMATOUS CHANGE OR MALIGNANCY  IDENTIFIED.    DIABETES MELLITUS II, UNCOMPLICATED 12/16/2006   Fall 10/18/2020   GERD (gastroesophageal reflux disease)    H/O abnormal mammogram 07/19/2009   S/P Breast Biopsy [facility], [place], 07/2009): Benign findings.     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/20/2016   OSTEOARTHRITIS, MULTI SITES 12/16/2006   Perennial allergic rhinitis with seasonal variation 12/16/2006        PONV (postoperative nausea and vomiting)    last surgery only   Recurrent maxillary sinusitis    SACROILIITIS, HISTORY OF 01/08/2009   Qualifier: History of  By: McDiarmid MD, Krystal     Seasonal allergies  Seborrheic keratosis 01/15/2016   SKELETAL HYPEROSTOSIS 03/29/2008   Ulcer    Uterine fibroid 02/02/2011   TVUS: 4cm fibroid w/ submucosal component, bil.hydrosalpinges, unable measurable endometrium - 08/18/2004     Medications:  No anticoagulants PTA  Assessment: 73 yo F with acute PE with right heart strain. Pharmacy consulted to manage heparin .  SCr 1.02 Hg 8.4, PLT 465  06/01/24 Heparin  level=0.46 (therapeutic) with heparin  infusing at 1200 units/hr No bleeding or infusion related issues noted   Goal of Therapy:  Heparin  level 0.3-0.7 units/ml Monitor platelets by anticoagulation protocol: Yes   Plan:  Continue IV Heparin  infusion at 1200 units/hr Daily CBC & heparin  level F/u for transition to DOAC   Thank you  for allowing pharmacy to be a part of this patient's care.  Airam Runions, PharmD 06/01/2024 1:13 AM

## 2024-06-01 NOTE — Progress Notes (Signed)
 PROGRESS NOTE  [name] FMW:[id number] DOB: 1951-06-13 DOA: 05/30/2024 PCP: McDiarmid, Krystal BIRCH, MD  HPI/Recap of past 24 hours: [name] is a 73 y.o. female with medical history significant for recently diagnosed left breast cancer on neoadjuvant Enhertu , T2DM, HTN, HLD, neuropathy, ADHD, GERD, and osteoarthritis who presented to the ED for evaluation of shortness of breath.  Patient reports sudden onset of shortness of breath that progressed with significant dyspnea on exertion. Patient had a follow-up with her oncologist and due to her elevated heart rate and shortness of breath, she was sent directly to the ED for further evaluation.  In the ED, HR 90s-100s, SpO2 95% on room air.  Labs significant for BNP 370, troponin 103-111, negative flu, RSV and COVID test. CTA chest PE study shows acute segmental and subsegmental pulmonary embolism with right heart strain (RV/LV = 1.4). Patient was started on heparin  drip. [facility] was consulted for admission.    Today, patient denies any new complaints, still requiring about 1 to 2 L of O2 intermittently.  Hemoglobin dropped to 6.8, transfused 1 unit of PRBC.    Assessment/Plan: Principal Problem:   Acute pulmonary embolism (HCC) Active Problems:   Acute hypoxic respiratory failure (HCC)   Acute pulmonary embolism Submassive PE Acute hypoxic respiratory failure Attempted to wean off oxygen, patient saturating around 89 to 93% on room air, continue supplemental oxygen as needed and attempt to wean once able CT A/P shows segmented and subsegmental pulmonary embolism with right heart strain Echo showed EF of 65- 70%, no regional wall motion abnormality, McConnell sign present, noted right ventricular pressure overload Doppler noted with bilateral DVT in the right posterior tibial vein, left posterior tibial vein and left peroneal vein Continue IV heparin  PCCM on board, appreciate recs Heme-onc recommended IR evaluation for possible  thrombolysis/thrombectomy, appreciate IR recs, likely will not need pulmonary thrombectomy or thrombolysis therapy Plan to switch to DOAC on 06/02/2024 Telemetry   Elevated troponin Troponins elevated to 103-111, likely demand ischemia in the setting of acute PE Remains chest pain-free Echo as above  Hypokalemia Hypomagnesemia Replace as needed  Normocytic anemia Iron  deficiency anemia Hemoglobin dropped to 7.1--> 6.8 on IV heparin  Noted downward trend since June Anemia panel showed iron  11, sat 6, vitamin B12 Type and screen Transfused 1 unit of PRBC Daily CBC   HTN BP noted to be stable Continue amlodipine , benazepril , carvedilol  for now, hold HCTZ,   T2DM Last A1c 7.1% 4 months ago SSI, Accu-Cheks and hypoglycemia protocol  Breast cancer Diagnosed with left breast cancer, estrogen receptor positive in May 2025 Started neoadjuvant Enhertu  2 months ago Patient's oncologist, [name], on board   Chronic pain Neuropathy Continue gabapentin  and as needed Norco  Obesity class II Lifestyle modification advised      Estimated body mass index is 35.28 kg/m as calculated from the following:   Height as of this encounter: 5' 1 (1.549 m).   Weight as of this encounter: 84.7 kg.     Code Status: Full  Family Communication: None at bedside  Disposition Plan: Status is: Inpatient Remains inpatient appropriate because: Level of care      Consultants: PCCM Heme-onc IR  Procedures: None  Antimicrobials: None  DVT prophylaxis: Heparin  drip   Objective: Vitals:   06/01/24 1100 06/01/24 1132 06/01/24 1200 06/01/24 1800  BP: (!) 175/62 (!) 166/57 (!) 186/85   Pulse: 92 89  95  Resp: (!) 23 20 19  (!) 21  Temp:  97.8 F (36.6  C) 97.6 F (36.4 C)   TempSrc:  Oral Oral   SpO2: 97% 96% 96% 95%  Weight:      Height:        Intake/Output Summary (Last 24 hours) at 06/01/2024 1913 Last data filed at 06/01/2024 1548 Gross per 24 hour  Intake 587.09 ml   Output --  Net 587.09 ml   Filed Weights   05/30/24 1502 05/30/24 2200  Weight: 85.3 kg 84.7 kg    Exam: General: NAD  Cardiovascular: S1, S2 present Respiratory: CTAB Abdomen: Soft, nontender, nondistended, bowel sounds present Musculoskeletal: Trace bilateral pedal edema noted Skin: Normal Psychiatry: Normal mood     Data Reviewed: CBC: Recent Labs  Lab 05/30/24 1330 05/30/24 1528 05/31/24 0353 06/01/24 0455 06/01/24 1400  WBC 8.6 9.1 8.0 9.0  --   NEUTROABS 4.1 4.6  --   --   --   HGB 8.9* 8.4* 7.1* 6.8* 9.3*  HCT 26.7* 26.6* 23.2* 22.4* 29.9*  MCV 81.2 84.7 84.7 85.5  --   PLT 500* 465* 398 436*  --    Basic Metabolic Panel: Recent Labs  Lab 05/30/24 1330 05/30/24 1528 05/31/24 0353 06/01/24 0455  NA 140 138 138 138  K 3.8 3.5 3.3* 3.5  CL 107 108 108 108  CO2 22 20* 23 22  GLUCOSE 191* 147* 118* 91  BUN 13 13 10 9   CREATININE 1.10* 1.02* 0.82 0.75  CALCIUM  8.2* 8.2* 7.8* 7.9*  MG  --   --  1.4* 1.7   GFR: Estimated Creatinine Clearance: 61 mL/min (by C-G formula based on SCr of 0.75 mg/dL). Liver Function Tests: Recent Labs  Lab 05/30/24 1330 05/30/24 1528  AST 19 23  ALT 21 23  ALKPHOS 64 55  BILITOT 0.2 0.3  PROT 6.0* 5.7*  ALBUMIN 3.2* 2.4*   No results for input(s): LIPASE, AMYLASE in the last 168 hours. No results for input(s): AMMONIA in the last 168 hours. Coagulation Profile: No results for input(s): INR, PROTIME in the last 168 hours. Cardiac Enzymes: No results for input(s): CKTOTAL, CKMB, CKMBINDEX, TROPONINI in the last 168 hours. BNP (last 3 results) No results for input(s): PROBNP in the last 8760 hours. HbA1C: Recent Labs    05/31/24 0117  HGBA1C 7.0*   CBG: Recent Labs  Lab 05/31/24 1528 05/31/24 2133 06/01/24 0750 06/01/24 1124 06/01/24 1646  GLUCAP 117* 117* 99 139* 139*   Lipid Profile: No results for input(s): CHOL, HDL, LDLCALC, TRIG, CHOLHDL, LDLDIRECT in the last 72  hours. Thyroid  Function Tests: No results for input(s): TSH, T4TOTAL, FREET4, T3FREE, THYROIDAB in the last 72 hours. Anemia Panel: Recent Labs    06/01/24 0455  VITAMINB12 1,235*  FOLATE 14.2  FERRITIN 140  TIBC 188*  IRON  11*   Urine analysis:    Component Value Date/Time   BILIRUBINUR negative 03/07/2024 1110   KETONESUR negative 03/07/2024 1110   PROTEINUR =100 (A) 03/07/2024 1110   UROBILINOGEN 0.2 03/07/2024 1110   NITRITE Positive (A) 03/07/2024 1110   LEUKOCYTESUR Moderate (2+) (A) 03/07/2024 1110   Sepsis Labs: @LABRCNTIP (procalcitonin:4,lacticidven:4)  ) Recent Results (from the past 240 hours)  Resp panel by RT-PCR (RSV, Flu A&B, Covid) Anterior Nasal Swab     Status: None   Collection Time: 05/30/24  3:32 PM   Specimen: Anterior Nasal Swab  Result Value Ref Range Status   SARS Coronavirus 2 by RT PCR NEGATIVE NEGATIVE Final    Comment: (NOTE) SARS-CoV-2 target nucleic acids are NOT DETECTED.  The  SARS-CoV-2 RNA is generally detectable in upper respiratory specimens during the acute phase of infection. The lowest concentration of SARS-CoV-2 viral copies this assay can detect is 138 copies/mL. A negative result does not preclude SARS-Cov-2 infection and should not be used as the sole basis for treatment or other patient management decisions. A negative result may occur with  improper specimen collection/handling, submission of specimen other than nasopharyngeal swab, presence of viral mutation(s) within the areas targeted by this assay, and inadequate number of viral copies(<138 copies/mL). A negative result must be combined with clinical observations, patient history, and epidemiological information. The expected result is Negative.  Fact Sheet for Patients:  BloggerCourse.com  Fact Sheet for Healthcare Providers:  SeriousBroker.it  This test is no t yet approved or cleared by the [place] FDA and  has been authorized for detection and/or diagnosis of SARS-CoV-2 by FDA under an Emergency Use Authorization (EUA). This EUA will remain  in effect (meaning this test can be used) for the duration of the COVID-19 declaration under Section 564(b)(1) of the Act, 21 U.S.C.section 360bbb-3(b)(1), unless the authorization is terminated  or revoked sooner.       Influenza A by PCR NEGATIVE NEGATIVE Final   Influenza B by PCR NEGATIVE NEGATIVE Final    Comment: (NOTE) The Xpert Xpress SARS-CoV-2/FLU/RSV plus assay is intended as an aid in the diagnosis of influenza from Nasopharyngeal swab specimens and should not be used as a sole basis for treatment. Nasal washings and aspirates are unacceptable for Xpert Xpress SARS-CoV-2/FLU/RSV testing.  Fact Sheet for Patients: BloggerCourse.com  Fact Sheet for Healthcare Providers: SeriousBroker.it  This test is not yet approved or cleared by the [place]  FDA and has been authorized for detection and/or diagnosis of SARS-CoV-2 by FDA under an Emergency Use Authorization (EUA). This EUA will remain in effect (meaning this test can be used) for the duration of the COVID-19 declaration under Section 564(b)(1) of the Act, 21 U.S.C. section 360bbb-3(b)(1), unless the authorization is terminated or revoked.     Resp Syncytial Virus by PCR NEGATIVE NEGATIVE Final    Comment: (NOTE) Fact Sheet for Patients: BloggerCourse.com  Fact Sheet for Healthcare Providers: SeriousBroker.it  This test is not yet approved or cleared by the [place]  FDA and has been authorized for detection and/or diagnosis of SARS-CoV-2 by FDA under an Emergency Use Authorization (EUA). This EUA will remain in effect (meaning this test can be used) for the duration of the COVID-19 declaration under Section 564(b)(1) of the Act, 21 U.S.C. section  360bbb-3(b)(1), unless the authorization is terminated or revoked.  Performed at [facility], [address], [place], [place] [address]   MRSA Next Gen by PCR, Nasal     Status: None   Collection Time: 05/31/24  3:10 AM   Specimen: Nasal Mucosa; Nasal Swab  Result Value Ref Range Status   MRSA by PCR Next Gen NOT DETECTED NOT DETECTED Final    Comment: (NOTE) The GeneXpert MRSA Assay (FDA approved for NASAL specimens only), is one component of a comprehensive MRSA colonization surveillance program. It is not intended to diagnose MRSA infection nor to guide or monitor treatment for MRSA infections. Test performance is not FDA approved in patients less than 109 years old. Performed at [facility], [address] [place], [place] [address]       Studies: No results found.   Scheduled Meds:  amLODipine   10 mg Oral Daily   carvedilol   6.25 mg Oral  BID WC   Chlorhexidine  Gluconate Cloth  6 each Topical Daily   feeding supplement  237 mL Oral BID BM   insulin  aspart  0-15 Units Subcutaneous TID WC   leptospermum manuka honey  1 Application Topical Daily   prenatal multivitamin  1 tablet Oral Daily   sodium chloride  flush  10-40 mL Intracatheter Q12H   sodium chloride  flush  3 mL Intravenous Q12H    Continuous Infusions:  sodium chloride  Stopped (06/01/24 0213)   sodium chloride  Stopped (06/01/24 9787)   alteplase  (tPA/ACTIVASE ) 12 mg in sodium chloride  0.9 % 238 mL Stopped (05/31/24 1431)   alteplase  (tPA/ACTIVASE ) 12 mg in sodium chloride  0.9 % 238 mL Stopped (05/31/24 1431)   heparin  1,200 Units/hr (06/01/24 1548)     LOS: 2 days     Lebron JINNY Cage, MD [facility]  If 7PM-7AM, please contact night-coverage [email or web address] 06/01/2024, 7:13 PM

## 2024-06-01 NOTE — Plan of Care (Signed)
  Problem: Education: Goal: Knowledge of General Education information will improve Description: Including pain rating scale, medication(s)/side effects and non-pharmacologic comfort measures Outcome: Progressing   Problem: Nutrition: Goal: Adequate nutrition will be maintained Outcome: Progressing   Problem: Coping: Goal: Level of anxiety will decrease Outcome: Progressing   Problem: Elimination: Goal: Will not experience complications related to urinary retention Outcome: Progressing   Problem: Pain Managment: Goal: General experience of comfort will improve and/or be controlled Outcome: Progressing   Problem: Coping: Goal: Ability to adjust to condition or change in health will improve Outcome: Progressing

## 2024-06-02 ENCOUNTER — Inpatient Hospital Stay (HOSPITAL_COMMUNITY)

## 2024-06-02 ENCOUNTER — Ambulatory Visit (HOSPITAL_COMMUNITY)

## 2024-06-02 DIAGNOSIS — I2699 Other pulmonary embolism without acute cor pulmonale: Secondary | ICD-10-CM | POA: Diagnosis not present

## 2024-06-02 DIAGNOSIS — I2609 Other pulmonary embolism with acute cor pulmonale: Secondary | ICD-10-CM | POA: Diagnosis not present

## 2024-06-02 LAB — TYPE AND SCREEN
ABO/RH(D): O POS
Antibody Screen: NEGATIVE
Unit division: 0

## 2024-06-02 LAB — CBC
HCT: 28.3 % — ABNORMAL LOW (ref 36.0–46.0)
Hemoglobin: 9 g/dL — ABNORMAL LOW (ref 12.0–15.0)
MCH: 27.3 pg (ref 26.0–34.0)
MCHC: 31.8 g/dL (ref 30.0–36.0)
MCV: 85.8 fL (ref 80.0–100.0)
Platelets: 458 K/uL — ABNORMAL HIGH (ref 150–400)
RBC: 3.3 MIL/uL — ABNORMAL LOW (ref 3.87–5.11)
RDW: 15.6 % — ABNORMAL HIGH (ref 11.5–15.5)
WBC: 12.4 K/uL — ABNORMAL HIGH (ref 4.0–10.5)
nRBC: 0.3 % — ABNORMAL HIGH (ref 0.0–0.2)

## 2024-06-02 LAB — GLUCOSE, CAPILLARY
Glucose-Capillary: 131 mg/dL — ABNORMAL HIGH (ref 70–99)
Glucose-Capillary: 126 mg/dL — ABNORMAL HIGH (ref 70–99)
Glucose-Capillary: 123 mg/dL — ABNORMAL HIGH (ref 70–99)
Glucose-Capillary: 153 mg/dL — ABNORMAL HIGH (ref 70–99)

## 2024-06-02 LAB — BASIC METABOLIC PANEL WITH GFR
Anion gap: 9 (ref 5–15)
BUN: 9 mg/dL (ref 8–23)
CO2: 23 mmol/L (ref 22–32)
Calcium: 8.3 mg/dL — ABNORMAL LOW (ref 8.9–10.3)
Chloride: 104 mmol/L (ref 98–111)
Creatinine, Ser: 0.69 mg/dL (ref 0.44–1.00)
GFR, Estimated: >60 mL/min (ref >60)
Glucose, Bld: 129 mg/dL — ABNORMAL HIGH (ref 70–99)
Potassium: 3.7 mmol/L (ref 3.5–5.1)
Sodium: 136 mmol/L (ref 135–145)

## 2024-06-02 LAB — HEPARIN LEVEL (UNFRACTIONATED): Heparin Unfractionated: 0.38 [IU]/mL (ref 0.30–0.70)

## 2024-06-02 LAB — BPAM RBC
Blood Product Expiration Date: 202509092359
ISSUE DATE / TIME: 202508140910
Unit Type and Rh: 5100

## 2024-06-02 MED ORDER — IRON SUCROSE 200 MG IVPB - SIMPLE MED
200.0000 mg | Freq: Once | Status: AC
  Administered 2024-06-02: 200 mg via INTRAVENOUS
  Filled 2024-06-02: qty 200

## 2024-06-02 MED ORDER — APIXABAN 5 MG PO TABS: 5.0000 mg | ORAL_TABLET | Freq: Two times a day (BID) | ORAL | Status: DC

## 2024-06-02 MED ORDER — APIXABAN 5 MG PO TABS
10.0000 mg | ORAL_TABLET | Freq: Two times a day (BID) | ORAL | Status: DC
  Administered 2024-06-02 – 2024-06-03 (×3): 10 mg via ORAL
  Filled 2024-06-02 (×3): qty 2

## 2024-06-02 NOTE — Progress Notes (Signed)
   NAME:  [name], MRN:  [id number], DOB:  02/09/1951, LOS: 3 ADMISSION DATE:  05/30/2024, CONSULTATION DATE:  05/31/24 REFERRING MD:  Mugweru , CHIEF COMPLAINT:  PE   History of Present Illness:  73 yo F PMH breast cancer currently on Enhertu , DM who was admitted to [facility] 8/12 after presenting to ED from her oncology appointment for eval for SOB and weakness. She was found to have bilateral segmental and subsegmental PE, with RV/LV ration 1.4, concerning for R heart strain. Her BNP was elevated 370 and trop 103. She was started on a hep gtt. Trops subsequently have down trended to 77  IR was asked to evaluate the patient, and recommended PCCM consultation 05/31/24 to help decide course of action   Pertinent  Medical History  Breast cancer DM   Significant Hospital Events: Including procedures, antibiotic start and stop dates in addition to other pertinent events   8/12 ED at referral of onc office for SOB eval. Found to have bilat segmental/subsegmental PE. Hep gtt and admitted to [facility]  8/13 IR consulted, possible catheter directed lytics candidate. PCCM consulted   Interim History / Subjective:  No distress.  Denies shortness of breath or chest pain.  Anxious to go home  Objective    Blood pressure (!) 152/50, pulse 76, temperature 98.3 F (36.8 C), temperature source Oral, resp. rate 14, height 5' 1 (1.549 m), weight 84.7 kg, SpO2 98%.        Intake/Output Summary (Last 24 hours) at 06/02/2024 0825 Last data filed at 06/02/2024 0320 Gross per 24 hour  Intake 632.87 ml  Output --  Net 632.87 ml   Filed Weights   05/30/24 1502 05/30/24 2200  Weight: 85.3 kg 84.7 kg   Examination: General pleasant 73 year old female patient currently up at the bedside commode she is in no acute distress HEENT normocephalic atraumatic no jugular venous distention is appreciated Pulmonary: Clear diminished bases currently 2 L/min, saturations of 100% so clearly room to titrate.  No accessory use  on the settings Cardiac regular rate and rhythm Abdomen soft nontender Extremities warm dry trace edema Neuro intact  Resolved problem list   Assessment and Plan   Submassive PE  // BLE DVT w/ Right Heart strain on echo Right ventricular Heart Failure  Mild elevated pulmonary artery pressures  AoC normocytic anemia Breast cancer on Enhertu   - PESI 104 -- Class III, intermediate risk  - not amenable to thrombectomy, but is candidate for IR catheter directed lytics, got transfused on 8/14 Plan Cont to wean supplemental oxygen for sats > 92% Would check walking oximetry prior to discharge Ok to transition to oral DOAC from our stand-point. Agree she needs life long AC (w/ her malignancy) avoid strenuous activity for at least 2 weeks Has f/u with us  mid September will also need ECHO   She has follow-up appointment already made We will sign off Best Practice (right click and Reselect all SmartList Selections daily)  Per primary   Critical care time: n/a

## 2024-06-02 NOTE — Plan of Care (Signed)

## 2024-06-02 NOTE — Discharge Instructions (Addendum)
 Information on my medicine - ELIQUIS  (apixaban )  This medication education was reviewed with me or my healthcare representative as part of my discharge preparation.  The pharmacist that spoke with me during my hospital stay was:  Lilyian Quayle, RPH  Why was Eliquis  prescribed for you? Eliquis  was prescribed to treat blood clots that may have been found in the veins of your legs (deep vein thrombosis) or in your lungs (pulmonary embolism) and to reduce the risk of them occurring again.  What do You need to know about Eliquis  ? The starting dose is 10 mg (two 5 mg tablets) taken TWICE daily for the FIRST SEVEN (7) DAYS, then on (enter date)  06/09/24  the dose is reduced to ONE 5 mg tablet taken TWICE daily.  Eliquis  may be taken with or without food.   Try to take the dose about the same time in the morning and in the evening. If you have difficulty swallowing the tablet whole please discuss with your pharmacist how to take the medication safely.  Take Eliquis  exactly as prescribed and DO NOT stop taking Eliquis  without talking to the doctor who prescribed the medication.  Stopping may increase your risk of developing a new blood clot.  Refill your prescription before you run out.  After discharge, you should have regular check-up appointments with your healthcare provider that is prescribing your Eliquis .    What do you do if you miss a dose? If a dose of ELIQUIS  is not taken at the scheduled time, take it as soon as possible on the same day and twice-daily administration should be resumed. The dose should not be doubled to make up for a missed dose.  Important Safety Information A possible side effect of Eliquis  is bleeding. You should call your healthcare provider right away if you experience any of the following: Bleeding from an injury or your nose that does not stop. Unusual colored urine (red or dark brown) or unusual colored stools (red or black). Unusual bruising for  unknown reasons. A serious fall or if you hit your head (even if there is no bleeding).  Some medicines may interact with Eliquis  and might increase your risk of bleeding or clotting while on Eliquis . To help avoid this, consult your healthcare provider or pharmacist prior to using any new prescription or non-prescription medications, including herbals, vitamins, non-steroidal anti-inflammatory drugs (NSAIDs) and supplements.  This website has more information on Eliquis  (apixaban ): [email or web address] .com/eliquis dena

## 2024-06-02 NOTE — Progress Notes (Signed)
 Mobility Specialist - Progress Note  (RA) Pre-mobility: 93% SpO2 During mobility: 94% SpO2 Post-mobility: 92% SPO2   06/02/24 1436  Mobility  Activity Ambulated with assistance  Level of Assistance Minimal assist, patient does 75% or more  Assistive Device Front wheel walker  Distance Ambulated (ft) 150 ft  Range of Motion/Exercises Active  Activity Response Tolerated well  Mobility Referral Yes  Mobility visit 1 Mobility  Mobility Specialist Start Time (ACUTE ONLY) 1420  Mobility Specialist Stop Time (ACUTE ONLY) 1436  Mobility Specialist Time Calculation (min) (ACUTE ONLY) 16 min   Pt was found in bed and agreeable to ambulate. Grew fatigued with session. Returned to bed with all needs met. Call bell in reach and RN notified.  Erminio Leos,  Mobility Specialist Can be reached via Secure Chat

## 2024-06-02 NOTE — Plan of Care (Signed)
  Problem: Education: Goal: Knowledge of General Education information will improve Description: Including pain rating scale, medication(s)/side effects and non-pharmacologic comfort measures Outcome: Progressing   Problem: Health Behavior/Discharge Planning: Goal: Ability to manage health-related needs will improve Outcome: Progressing   Problem: Clinical Measurements: Goal: Respiratory complications will improve Outcome: Progressing Goal: Cardiovascular complication will be avoided Outcome: Progressing   Problem: Activity: Goal: Risk for activity intolerance will decrease Outcome: Progressing   Problem: Nutrition: Goal: Adequate nutrition will be maintained Outcome: Progressing   Problem: Coping: Goal: Level of anxiety will decrease Outcome: Progressing   Problem: Pain Managment: Goal: General experience of comfort will improve and/or be controlled Outcome: Progressing   Problem: Safety: Goal: Ability to remain free from injury will improve Outcome: Progressing

## 2024-06-02 NOTE — Progress Notes (Signed)
 PHARMACY - ANTICOAGULATION CONSULT NOTE  Pharmacy Consult for heparin  >> apixaban   Indication: pulmonary embolus  Allergies  Allergen Reactions   Januvia  [Sitagliptin ] Other (See Comments)    Epigastric abdominal pain   Jardiance  [Empagliflozin ] Other (See Comments)    Lightheadedness, near-syncope, extreme yeast problems   Celecoxib Palpitations   Diclofenac-Misoprostol Palpitations and Other (See Comments)    Arthrotec    Patient Measurements: Height: 5' 1 (154.9 cm) Weight: 84.7 kg (186 lb 11.7 oz) IBW/kg (Calculated) : 47.8 HEPARIN  DW (KG): 67.2  Vital Signs: Temp: 98.3 F (36.8 C) (08/15 0802) Temp Source: Oral (08/15 0802) BP: 151/55 (08/15 0802) Pulse Rate: 86 (08/15 0845)  Labs: Recent Labs    05/30/24 1742 05/31/24 0117 05/31/24 0353 05/31/24 1525 06/01/24 0049 06/01/24 0455 06/01/24 1400 06/02/24 0432  HGB  --   --  7.1*  --   --  6.8* 9.3* 9.0*  HCT  --   --  23.2*  --   --  22.4* 29.9* 28.3*  PLT  --   --  398  --   --  436*  --  458*  HEPARINUNFRC  --   --  0.29*   < > 0.46 0.46  --  0.38  CREATININE  --   --  0.82  --   --  0.75  --  0.69  TROPONINIHS 111* 93* 77*  --   --   --   --   --    < > = values in this interval not displayed.    Estimated Creatinine Clearance: 61 mL/min (by C-G formula based on SCr of 0.69 mg/dL).   Medical History: Past Medical History:  Diagnosis Date   Acute MEE (middle ear effusion) 06/07/2014   Acute recurrent sinusitis 12/21/2014   Acute sinusitis 12/21/2014   Allergy    Anemia    hx   Asthma, intermittent 11/02/2018   ATTENTION DEFICIT, W/O HYPERACTIVITY 12/16/2006   Bilateral carpal tunnel syndrome 11/20/2016   Cataract    Cervical spondylosis with myelopathy, History of 04/28/2012   COLON POLYP 12/16/2006   (02/03/12) Surgical [Pathology], sigmoid colon, polyp HYPERPLASTIC POLYP(S) AND POLYPOID FRAGMETNS OF BENIGN COLONIC MUCOSA WITH INTRAMUCOSAL LYMPHOID AGGREGATES. NO ADENOMATOUS CHANGE OR MALIGNANCY  IDENTIFIED.    DIABETES MELLITUS II, UNCOMPLICATED 12/16/2006   Fall 10/18/2020   GERD (gastroesophageal reflux disease)    H/O abnormal mammogram 07/19/2009   S/P Breast Biopsy [facility], [place], 07/2009): Benign findings.     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/20/2016   OSTEOARTHRITIS, MULTI SITES 12/16/2006   Perennial allergic rhinitis with seasonal variation 12/16/2006        PONV (postoperative nausea and vomiting)    last surgery only   Recurrent maxillary sinusitis    SACROILIITIS, HISTORY OF 01/08/2009   Qualifier: History of  By: McDiarmid MD, Krystal     Seasonal allergies  Seborrheic keratosis 01/15/2016   SKELETAL HYPEROSTOSIS 03/29/2008   Ulcer    Uterine fibroid 02/02/2011   TVUS: 4cm fibroid w/ submucosal component, bil.hydrosalpinges, unable measurable endometrium - 08/18/2004     Medications:  No anticoagulants PTA  Assessment: 73 yo F with acute PE with right heart strain. Pharmacy consulted to manage heparin .   On 8/13, IR consulted for thrombolysis. Patient would like to avoid any invasive procedures if possible and concerned about bleeding risk with alteplase . Decision made to defer procedure at that time.  06/02/24 Heparin  level 0.38 remains therapeutic with heparin  infusing at 1200 units/hr Hgb  up to 9.0 after transfusion Plan to transition to apixaban  today    Goal of Therapy:   Heparin  level 0.3-0.7 units/ml Monitor platelets by anticoagulation protocol: Yes   Plan:  - Stop heparin  drip and start apixaban  10 mg PO BID x 7 days then 5 mg PO BID  - Monitor SCr, CBC  -Monitor closely for any new or worsening bleeding      Jaylissa Felty, PharmD, BCPS 06/02/2024 9:04 AM

## 2024-06-02 NOTE — Progress Notes (Signed)
 PROGRESS NOTE  [name] FMW:[id number] DOB: 11/11/1950 DOA: 05/30/2024 PCP: McDiarmid, Krystal BIRCH, MD  HPI/Recap of past 24 hours: [name] is a 73 y.o. female with medical history significant for recently diagnosed left breast cancer on neoadjuvant Enhertu , T2DM, HTN, HLD, neuropathy, ADHD, GERD, and osteoarthritis who presented to the ED for evaluation of shortness of breath.  Patient reports sudden onset of shortness of breath that progressed with significant dyspnea on exertion. Patient had a follow-up with her oncologist and due to her elevated heart rate and shortness of breath, she was sent directly to the ED for further evaluation.  In the ED, HR 90s-100s, SpO2 95% on room air.  Labs significant for BNP 370, troponin 103-111, negative flu, RSV and COVID test. CTA chest PE study shows acute segmental and subsegmental pulmonary embolism with right heart strain (RV/LV = 1.4). Patient was started on heparin  drip. TRH was consulted for admission.    Today, patient denied any new complaints.  Patient was able to ambulate the hallway, but grew fatigued with session.     Assessment/Plan: Principal Problem:   Acute pulmonary embolism (HCC) Active Problems:   Acute hypoxic respiratory failure (HCC)   Acute pulmonary embolism Submassive PE Acute hypoxic respiratory failure Attempted to wean off oxygen, patient saturating around 89 to 93% on room air, continue supplemental oxygen as needed and attempt to wean once able CT A/P shows segmented and subsegmental pulmonary embolism with right heart strain Echo showed EF of 65- 70%, no regional wall motion abnormality, McConnell sign present, noted right ventricular pressure overload Doppler noted with bilateral DVT in the right posterior tibial vein, left posterior tibial vein and left peroneal vein Switch to p.o. apixaban , stop IV heparin  PCCM on board, appreciate recs Heme-onc recommended IR evaluation for possible  thrombolysis/thrombectomy, appreciate IR recs, likely will not need pulmonary thrombectomy or thrombolysis therapy Switched to DOAC on 06/02/2024 Telemetry  Leukocytosis Afebrile UA negative, procalcitonin pending Chest x-ray unremarkable Repeat CBC   Elevated troponin Troponins elevated to 103-111, likely demand ischemia in the setting of acute PE Remains chest pain-free Echo as above  Hypokalemia Hypomagnesemia Replace as needed  Normocytic anemia Iron  deficiency anemia Hemoglobin dropped to 7.1--> 6.8 on IV heparin  Noted downward trend since June Anemia panel showed iron  11, sat 6, vitamin B12 Transfused 1 unit of PRBC on 8/14 Give 1 dose of IV iron  on 8/15 Daily CBC   HTN BP noted to be stable Continue amlodipine , benazepril , carvedilol  for now, hold HCTZ,   T2DM Last A1c 7.1% 4 months ago SSI, Accu-Cheks and hypoglycemia protocol  Breast cancer Diagnosed with left breast cancer, estrogen receptor positive in May 2025 Started neoadjuvant Enhertu  2 months ago Patient's oncologist, [name], on board   Chronic pain Neuropathy Continue gabapentin  and as needed Norco  Obesity class II Lifestyle modification advised      Estimated body mass index is 35.28 kg/m as calculated from the following:   Height as of this encounter: 5' 1 (1.549 m).   Weight as of this encounter: 84.7 kg.     Code Status: Full  Family Communication: None at bedside  Disposition Plan: Status is: Inpatient Remains inpatient appropriate because: Level of care      Consultants: PCCM Heme-onc IR  Procedures: None  Antimicrobials: None  DVT prophylaxis: Eliquis    Objective: Vitals:   06/02/24 1200 06/02/24 1234 06/02/24 1600 06/02/24 1700  BP:    (!) 161/77  Pulse: 84 91  84  Resp: (!)  26 17  19   Temp:  97.7 F (36.5 C) 98.7 F (37.1 C)   TempSrc:  Oral Oral   SpO2: 93% 95%  95%  Weight:      Height:        Intake/Output Summary (Last 24 hours) at  06/02/2024 1910 Last data filed at 06/02/2024 1133 Gross per 24 hour  Intake 212.49 ml  Output --  Net 212.49 ml   Filed Weights   05/30/24 1502 05/30/24 2200  Weight: 85.3 kg 84.7 kg    Exam: General: NAD  Cardiovascular: S1, S2 present Respiratory: CTAB Abdomen: Soft, nontender, nondistended, bowel sounds present Musculoskeletal: No bilateral pedal edema noted Skin: Normal Psychiatry: Normal mood     Data Reviewed: CBC: Recent Labs  Lab 05/30/24 1330 05/30/24 1528 05/31/24 0353 06/01/24 0455 06/01/24 1400 06/02/24 0432  WBC 8.6 9.1 8.0 9.0  --  12.4*  NEUTROABS 4.1 4.6  --   --   --   --   HGB 8.9* 8.4* 7.1* 6.8* 9.3* 9.0*  HCT 26.7* 26.6* 23.2* 22.4* 29.9* 28.3*  MCV 81.2 84.7 84.7 85.5  --  85.8  PLT 500* 465* 398 436*  --  458*   Basic Metabolic Panel: Recent Labs  Lab 05/30/24 1330 05/30/24 1528 05/31/24 0353 06/01/24 0455 06/02/24 0432  NA 140 138 138 138 136  K 3.8 3.5 3.3* 3.5 3.7  CL 107 108 108 108 104  CO2 22 20* 23 22 23   GLUCOSE 191* 147* 118* 91 129*  BUN 13 13 10 9 9   CREATININE 1.10* 1.02* 0.82 0.75 0.69  CALCIUM  8.2* 8.2* 7.8* 7.9* 8.3*  MG  --   --  1.4* 1.7  --    GFR: Estimated Creatinine Clearance: 61 mL/min (by C-G formula based on SCr of 0.69 mg/dL). Liver Function Tests: Recent Labs  Lab 05/30/24 1330 05/30/24 1528  AST 19 23  ALT 21 23  ALKPHOS 64 55  BILITOT 0.2 0.3  PROT 6.0* 5.7*  ALBUMIN 3.2* 2.4*   No results for input(s): LIPASE, AMYLASE in the last 168 hours. No results for input(s): AMMONIA in the last 168 hours. Coagulation Profile: No results for input(s): INR, PROTIME in the last 168 hours. Cardiac Enzymes: No results for input(s): CKTOTAL, CKMB, CKMBINDEX, TROPONINI in the last 168 hours. BNP (last 3 results) No results for input(s): PROBNP in the last 8760 hours. HbA1C: Recent Labs    05/31/24 0117  HGBA1C 7.0*   CBG: Recent Labs  Lab 06/01/24 1646 06/01/24 2130  06/02/24 0737 06/02/24 1213 06/02/24 1637  GLUCAP 139* 139* 131* 126* 123*   Lipid Profile: No results for input(s): CHOL, HDL, LDLCALC, TRIG, CHOLHDL, LDLDIRECT in the last 72 hours. Thyroid  Function Tests: No results for input(s): TSH, T4TOTAL, FREET4, T3FREE, THYROIDAB in the last 72 hours. Anemia Panel: Recent Labs    06/01/24 0455  VITAMINB12 1,235*  FOLATE 14.2  FERRITIN 140  TIBC 188*  IRON  11*   Urine analysis:    Component Value Date/Time   COLORURINE YELLOW 06/01/2024 1955   APPEARANCEUR CLEAR 06/01/2024 1955   LABSPEC 1.020 06/01/2024 1955   PHURINE 5.0 06/01/2024 1955   GLUCOSEU NEGATIVE 06/01/2024 1955   HGBUR NEGATIVE 06/01/2024 1955   BILIRUBINUR NEGATIVE 06/01/2024 1955   BILIRUBINUR negative 03/07/2024 1110   KETONESUR 5 (A) 06/01/2024 1955   PROTEINUR 30 (A) 06/01/2024 1955   UROBILINOGEN 0.2 03/07/2024 1110   NITRITE NEGATIVE 06/01/2024 1955   LEUKOCYTESUR NEGATIVE 06/01/2024 1955   Sepsis Labs: @  LABRCNTIP(procalcitonin:4,lacticidven:4)  ) Recent Results (from the past 240 hours)  Resp panel by RT-PCR (RSV, Flu A&B, Covid) Anterior Nasal Swab     Status: None   Collection Time: 05/30/24  3:32 PM   Specimen: Anterior Nasal Swab  Result Value Ref Range Status   SARS Coronavirus 2 by RT PCR NEGATIVE NEGATIVE Final    Comment: (NOTE) SARS-CoV-2 target nucleic acids are NOT DETECTED.  The SARS-CoV-2 RNA is generally detectable in upper respiratory specimens during the acute phase of infection. The lowest concentration of SARS-CoV-2 viral copies this assay can detect is 138 copies/mL. A negative result does not preclude SARS-Cov-2 infection and should not be used as the sole basis for treatment or other patient management decisions. A negative result may occur with  improper specimen collection/handling, submission of specimen other than nasopharyngeal swab, presence of viral mutation(s) within the areas targeted by this  assay, and inadequate number of viral copies(<138 copies/mL). A negative result must be combined with clinical observations, patient history, and epidemiological information. The expected result is Negative.  Fact Sheet for Patients:  BloggerCourse.com  Fact Sheet for Healthcare Providers:  SeriousBroker.it  This test is no t yet approved or cleared by the [place]  FDA and  has been authorized for detection and/or diagnosis of SARS-CoV-2 by FDA under an Emergency Use Authorization (EUA). This EUA will remain  in effect (meaning this test can be used) for the duration of the COVID-19 declaration under Section 564(b)(1) of the Act, 21 U.S.C.section 360bbb-3(b)(1), unless the authorization is terminated  or revoked sooner.       Influenza A by PCR NEGATIVE NEGATIVE Final   Influenza B by PCR NEGATIVE NEGATIVE Final    Comment: (NOTE) The Xpert Xpress SARS-CoV-2/FLU/RSV plus assay is intended as an aid in the diagnosis of influenza from Nasopharyngeal swab specimens and should not be used as a sole basis for treatment. Nasal washings and aspirates are unacceptable for Xpert Xpress SARS-CoV-2/FLU/RSV testing.  Fact Sheet for Patients: BloggerCourse.com  Fact Sheet for Healthcare Providers: SeriousBroker.it  This test is not yet approved or cleared by the [place]  FDA and has been authorized for detection and/or diagnosis of SARS-CoV-2 by FDA under an Emergency Use Authorization (EUA). This EUA will remain in effect (meaning this test can be used) for the duration of the COVID-19 declaration under Section 564(b)(1) of the Act, 21 U.S.C. section 360bbb-3(b)(1), unless the authorization is terminated or revoked.     Resp Syncytial Virus by PCR NEGATIVE NEGATIVE Final    Comment: (NOTE) Fact Sheet for Patients: BloggerCourse.com  Fact Sheet for  Healthcare Providers: SeriousBroker.it  This test is not yet approved or cleared by the [place]  FDA and has been authorized for detection and/or diagnosis of SARS-CoV-2 by FDA under an Emergency Use Authorization (EUA). This EUA will remain in effect (meaning this test can be used) for the duration of the COVID-19 declaration under Section 564(b)(1) of the Act, 21 U.S.C. section 360bbb-3(b)(1), unless the authorization is terminated or revoked.  Performed at [facility], [address] [place], [place] [address]   MRSA Next Gen by PCR, Nasal     Status: None   Collection Time: 05/31/24  3:10 AM   Specimen: Nasal Mucosa; Nasal Swab  Result Value Ref Range Status   MRSA by PCR Next Gen NOT DETECTED NOT DETECTED Final    Comment: (NOTE) The GeneXpert MRSA Assay (FDA approved for NASAL specimens only), is one component of a comprehensive MRSA colonization  surveillance program. It is not intended to diagnose MRSA infection nor to guide or monitor treatment for MRSA infections. Test performance is not FDA approved in patients less than 80 years old. Performed at [facility], [address], [place], [place] [address]       Studies: DG CHEST PORT 1 VIEW Result Date: 06/02/2024 CLINICAL DATA:  Cough. EXAM: PORTABLE CHEST 1 VIEW COMPARISON:  Radiograph and CT 05/30/2024 FINDINGS: Right chest port in place. Normal heart size with stable mediastinal contours. Aortic atherosclerosis. No focal airspace disease, pleural effusion or pneumothorax. No pulmonary edema. No acute osseous findings. IMPRESSION: No acute chest findings. Electronically Signed   By: Andrea Gasman M.D.   On: 06/02/2024 15:36     Scheduled Meds:  amLODipine   10 mg Oral Daily   apixaban   10 mg Oral BID   Followed by   NOREEN ON 06/09/2024] apixaban   5 mg Oral BID   benazepril   20 mg Oral Daily   carvedilol   6.25 mg Oral BID WC   Chlorhexidine   Gluconate Cloth  6 each Topical Daily   feeding supplement  237 mL Oral BID BM   insulin  aspart  0-15 Units Subcutaneous TID WC   leptospermum manuka honey  1 Application Topical Daily   prenatal multivitamin  1 tablet Oral Daily   sodium chloride  flush  10-40 mL Intracatheter Q12H   sodium chloride  flush  3 mL Intravenous Q12H    Continuous Infusions:     LOS: 3 days     Barbara JINNY Cage, MD [facility]  If 7PM-7AM, please contact night-coverage [email or web address] 06/02/2024, 7:10 PM

## 2024-06-03 ENCOUNTER — Other Ambulatory Visit (HOSPITAL_COMMUNITY): Payer: Self-pay

## 2024-06-03 DIAGNOSIS — I2699 Other pulmonary embolism without acute cor pulmonale: Secondary | ICD-10-CM | POA: Diagnosis not present

## 2024-06-03 LAB — GLUCOSE, CAPILLARY: Glucose-Capillary: 165 mg/dL — ABNORMAL HIGH (ref 70–99)

## 2024-06-03 LAB — CBC
HCT: 26.5 % — ABNORMAL LOW (ref 36.0–46.0)
Hemoglobin: 8.4 g/dL — ABNORMAL LOW (ref 12.0–15.0)
MCH: 27.5 pg (ref 26.0–34.0)
MCHC: 31.7 g/dL (ref 30.0–36.0)
MCV: 86.6 fL (ref 80.0–100.0)
Platelets: 410 K/uL — ABNORMAL HIGH (ref 150–400)
RBC: 3.06 MIL/uL — ABNORMAL LOW (ref 3.87–5.11)
RDW: 15.7 % — ABNORMAL HIGH (ref 11.5–15.5)
WBC: 10.3 K/uL (ref 4.0–10.5)
nRBC: 0 % (ref 0.0–0.2)

## 2024-06-03 MED ORDER — APIXABAN (ELIQUIS) VTE STARTER PACK (10MG AND 5MG)
ORAL_TABLET | ORAL | 0 refills | Status: DC
  Filled 2024-06-03: qty 74, 30d supply, fill #0

## 2024-06-03 MED ORDER — HEPARIN SOD (PORK) LOCK FLUSH 100 UNIT/ML IV SOLN
500.0000 [IU] | INTRAVENOUS | Status: AC | PRN
  Administered 2024-06-03: 500 [IU]

## 2024-06-03 NOTE — Plan of Care (Signed)
  Problem: Education: Goal: Knowledge of General Education information will improve Description: Including pain rating scale, medication(s)/side effects and non-pharmacologic comfort measures Outcome: Progressing   Problem: Activity: Goal: Risk for activity intolerance will decrease Outcome: Progressing   Problem: Coping: Goal: Level of anxiety will decrease Outcome: Progressing   Problem: Elimination: Goal: Will not experience complications related to urinary retention Outcome: Progressing   Problem: Pain Managment: Goal: General experience of comfort will improve and/or be controlled Outcome: Progressing   Problem: Safety: Goal: Ability to remain free from injury will improve Outcome: Progressing

## 2024-06-03 NOTE — Progress Notes (Signed)
 Discharge med in a secure bag delivered to pt in room by this RN

## 2024-06-03 NOTE — Progress Notes (Addendum)
SATURATION QUALIFICATIONS: (This note is used to comply with regulatory documentation for home oxygen)  Patient Saturations on Room Air at Rest = 98%  Patient Saturations on Room Air while Ambulating = 95%   

## 2024-06-03 NOTE — Discharge Summary (Addendum)
 Physician Discharge Summary   Patient: [name] MRN: [id number] DOB: Oct 13, 1951  Admit date:     05/30/2024  Discharge date: 06/03/24  Discharge Physician: Lebron JINNY Cage   PCP: McDiarmid, Krystal BIRCH, MD   Recommendations at discharge:   Follow-up with PCP in 1 week Follow-up with oncology as scheduled Follow-up with sleep study center for evaluation of OSA  Discharge Diagnoses: Principal Problem:   Acute pulmonary embolism (HCC) Active Problems:   Acute hypoxic respiratory failure [facility])    Hospital Course: [name] is a 73 y.o. female with medical history significant for recently diagnosed left breast cancer on neoadjuvant Enhertu , T2DM, HTN, HLD, neuropathy, ADHD, GERD, and osteoarthritis who presented to the ED for evaluation of shortness of breath.  Patient reports sudden onset of shortness of breath that progressed with significant dyspnea on exertion. Patient had a follow-up with her oncologist and due to her elevated heart rate and shortness of breath, she was sent directly to the ED for further evaluation.  In the ED, HR 90s-100s, SpO2 95% on room air.  Labs significant for BNP 370, troponin 103-111, negative flu, RSV and COVID test. CTA chest PE study shows acute segmental and subsegmental pulmonary embolism with right heart strain (RV/LV = 1.4). Patient was started on heparin  drip. TRH was consulted for admission.     Today, patient denies any complaints, very eager to be discharged.  Patient was able to ambulate the hallway with saturations staying well above 90s.  Denies any chest pain, shortness of breath, abdominal pain, nausea/vomiting, fever/chills.    Assessment and Plan:  Acute pulmonary embolism Submassive PE Acute hypoxic respiratory failure Currently on room air, oxygen weaned off CT A/P shows segmented and subsegmental pulmonary embolism with right heart strain Echo showed EF of 65- 70%, no regional wall motion abnormality, McConnell sign present,  noted right ventricular pressure overload Doppler noted with bilateral DVT in the right posterior tibial vein, left posterior tibial vein and left peroneal vein Discharge on p.o. apixaban , for lifelong Follow-up with heme-onc, PCP   Leukocytosis Resolved Afebrile UA negative Chest x-ray unremarkable   Elevated troponin Troponins elevated to 103-111, likely demand ischemia in the setting of acute PE Remains chest pain-free Echo as above   Hypokalemia Hypomagnesemia Replaced as needed Follow-up with PCP with repeat labs   Normocytic anemia Iron  deficiency anemia Hemoglobin dropped to 7.1--> 6.8 on IV heparin  Noted downward trend since June Anemia panel showed iron  11, sat 6, vitamin B12--> 1,235 Transfused 1 unit of PRBC on 8/14 Gave 1 dose of IV iron  on 8/15 Outpatient follow-up with heme-onc, PCP  Nocturnal hypoxia Noted to drop sats to the 80s while sleeping Ambulatory sleep study ordered   HTN BP noted to be stable Continue amlodipine , benazepril , carvedilol , hydrochlorothiazide  Advised to monitor BP   T2DM Last A1c 7.1% 4 months ago Continue home regimen   Breast cancer Diagnosed with left breast cancer, estrogen receptor positive in May 2025 Started neoadjuvant Enhertu  2 months ago Outpatient follow-up with [name]   Chronic pain Neuropathy Continue gabapentin  and as needed Norco   Obesity class II Lifestyle modification advised         Consultants: PCCM, IR, heme-onc Procedures performed: None Disposition: Home Diet recommendation:  Cardiac and Carb modified diet   DISCHARGE MEDICATION: Allergies as of 06/03/2024       Reactions   Januvia  [sitagliptin ] Other (See Comments)   Epigastric abdominal pain   Jardiance  [empagliflozin ] Other (See Comments)   Lightheadedness,  near-syncope, extreme yeast problems   Celecoxib Palpitations   Diclofenac-misoprostol Palpitations, Other (See Comments)   Arthrotec        Medication List      STOP taking these medications    naproxen  500 MG tablet Commonly known as: NAPROSYN        TAKE these medications    amLODipine -benazepril  10-20 MG capsule Commonly known as: LOTREL TAKE 1 CAPSULE BY MOUTH DAILY   atorvastatin  40 MG tablet Commonly known as: LIPITOR Take 1 tablet (40 mg total) by mouth daily.   blood glucose meter kit and supplies Kit Dispense based on patient and insurance preference. Use up to four times daily as directed.   Blood Glucose Monitoring Suppl Devi 1 each by Does not apply route in the morning, at noon, and at bedtime. May substitute to any manufacturer covered by patient's insurance.   carvedilol  6.25 MG tablet Commonly known as: COREG  TAKE 1 TABLET(6.25 MG) BY MOUTH TWICE DAILY WITH A MEAL   cyanocobalamin 1000 MCG tablet Commonly known as: VITAMIN B12 Take 1 tablet (1,000 mcg total) by mouth daily.   diphenoxylate -atropine  2.5-0.025 MG tablet Commonly known as: LOMOTIL  Take 1-2 tablets by mouth 4 (four) times daily as needed for diarrhea or loose stools.   Eliquis  DVT/PE Starter Pack Generic drug: Apixaban  Starter Pack (10mg  and 5mg ) Take as directed on package: start with two-5mg  tablets twice daily for 7 days. On day 8, switch to one-5mg  tablet twice daily.   fexofenadine  180 MG tablet Commonly known as: ALLEGRA  Take 180 mg by mouth daily as needed for allergies or rhinitis.   gabapentin  100 MG capsule Commonly known as: NEURONTIN  TAKE 1 CAPSULE(100 MG) BY MOUTH THREE TIMES DAILY AS NEEDED What changed: See the new instructions.   hydrochlorothiazide  25 MG tablet Commonly known as: HYDRODIURIL  TAKE 1 TABLET(25 MG) BY MOUTH DAILY   HYDROcodone -acetaminophen  7.5-325 MG tablet Commonly known as: NORCO Take 1 tablet by mouth every 8 (eight) hours as needed for moderate pain (pain score 4-6). What changed:  when to take this additional instructions   lidocaine -prilocaine  cream Commonly known as: EMLA  Apply 1 Application  topically daily as needed. Use as directed for port access What changed:  reasons to take this additional instructions   metFORMIN  500 MG 24 hr tablet Commonly known as: GLUCOPHAGE -XR TAKE 1 TABLET(500 MG) BY MOUTH TWICE DAILY WITH A MEAL   methylphenidate  10 MG tablet Commonly known as: Ritalin  Take 2 tablets (20 mg total) by mouth 3 (three) times daily with meals. What changed:  when to take this additional instructions   ondansetron  8 MG tablet Commonly known as: Zofran  Take 1 tablet (8 mg total) by mouth every 8 (eight) hours as needed for nausea or vomiting. Start on the third day after chemotherapy.   One-A-Day Womens Prenatal 1 28-0.8-235 MG Caps Take 1 capsule by mouth daily with breakfast.   prochlorperazine  10 MG tablet Commonly known as: COMPAZINE  Take 1 tablet (10 mg total) by mouth every 6 (six) hours as needed for nausea or vomiting.        Follow-up Information     McDiarmid, Krystal BIRCH, MD. Schedule an appointment as soon as possible for a visit in 1 week(s).   Specialty: Family Medicine Contact information: [address] [place] [address] [phone]                Discharge Exam: [name]   05/30/24 1502 05/30/24 2200  Weight: 85.3 kg 84.7 kg   General: NAD  Cardiovascular: S1, S2 present Respiratory: CTAB Abdomen: Soft, nontender, nondistended, bowel sounds present Musculoskeletal: No bilateral pedal edema noted Skin: Normal Psychiatry: Normal mood   Condition at discharge: stable  The results of significant diagnostics from this hospitalization (including imaging, microbiology, ancillary and laboratory) are listed below for reference.   Imaging Studies: DG CHEST PORT 1 VIEW Result Date: 06/02/2024 CLINICAL DATA:  Cough. EXAM: PORTABLE CHEST 1 VIEW COMPARISON:  Radiograph and CT 05/30/2024 FINDINGS: Right chest port in place. Normal heart size with stable mediastinal contours. Aortic atherosclerosis. No focal  airspace disease, pleural effusion or pneumothorax. No pulmonary edema. No acute osseous findings. IMPRESSION: No acute chest findings. Electronically Signed   By: Andrea Gasman M.D.   On: 06/02/2024 15:36   ECHOCARDIOGRAM COMPLETE Result Date: 06/01/2024    ECHOCARDIOGRAM REPORT   Patient Name:   [name] Date of Exam: 05/31/2024 Medical Rec #:  [id number]    Height:       61.0 in Accession #:    [id number]   Weight:       186.7 lb Date of Birth:  06-01-1951    BSA:          1.834 m Patient Age:    73 years     BP:           105/44 mmHg Patient Gender: F            HR:           88 bpm. Exam Location:  Inpatient Procedure: 2D Echo, Color Doppler and Cardiac Doppler (Both Spectral and Color            Flow Doppler were utilized during procedure). Indications:    Pulmonary embolus  History:        Patient has prior history of Echocardiogram examinations, most                 recent 02/19/2024. Pulmonary embolism.  Sonographer:    Benard Stallion Referring Phys: [id number] PROSPER M AMPONSAH IMPRESSIONS  1. Left ventricular ejection fraction, by estimation, is 65 to 70%. The left ventricle has normal function. The left ventricle has no regional wall motion abnormalities. There is mild concentric left ventricular hypertrophy. Left ventricular diastolic parameters were normal. There is the interventricular septum is flattened in systole, consistent with right ventricular pressure overload.  2. McConnell's sign present (RV dysfunction with apical sparing) suggestive of acute PE. Right ventricular systolic function is moderately reduced. The right ventricular size is moderately enlarged. There is mildly elevated pulmonary artery systolic pressure. The estimated right ventricular systolic pressure is 41.9 mmHg.  3. Right atrial size was moderately dilated.  4. The mitral valve is normal in structure. No evidence of mitral valve regurgitation. No evidence of mitral stenosis.  5. The aortic valve is bicuspid. There is  mild calcification of the aortic valve. Aortic valve regurgitation is not visualized. Aortic valve sclerosis/calcification is present, without any evidence of aortic stenosis.  6. The inferior vena cava is normal in size with greater than 50% respiratory variability, suggesting right atrial pressure of 3 mmHg. FINDINGS  Left Ventricle: Left ventricular ejection fraction, by estimation, is 65 to 70%. The left ventricle has normal function. The left ventricle has no regional wall motion abnormalities. The left ventricular internal cavity size was normal in size. There is  mild concentric left ventricular hypertrophy. The interventricular septum is flattened in systole, consistent with right ventricular pressure overload. Left ventricular diastolic parameters were normal. Right Ventricle: McConnell's  sign present (RV dysfunction with apical sparing) suggestive of acute PE. The right ventricular size is moderately enlarged. No increase in right ventricular wall thickness. Right ventricular systolic function is moderately reduced. There is mildly elevated pulmonary artery systolic pressure. The tricuspid regurgitant velocity is 3.12 m/s, and with an assumed right atrial pressure of 3 mmHg, the estimated right ventricular systolic pressure is 41.9 mmHg. Left Atrium: Left atrial size was normal in size. Right Atrium: Right atrial size was moderately dilated. Pericardium: Trivial pericardial effusion is present. The pericardial effusion is posterior to the left ventricle. Mitral Valve: The mitral valve is normal in structure. No evidence of mitral valve regurgitation. No evidence of mitral valve stenosis. Tricuspid Valve: The tricuspid valve is normal in structure. Tricuspid valve regurgitation is mild . No evidence of tricuspid stenosis. Aortic Valve: The aortic valve is bicuspid. There is mild calcification of the aortic valve. Aortic valve regurgitation is not visualized. Aortic valve sclerosis/calcification is present,  without any evidence of aortic stenosis. Aortic valve mean gradient measures 5.0 mmHg. Aortic valve peak gradient measures 10.2 mmHg. Aortic valve area, by VTI measures 1.52 cm. Pulmonic Valve: The pulmonic valve was normal in structure. Pulmonic valve regurgitation is trivial. No evidence of pulmonic stenosis. Aorta: The aortic root is normal in size and structure. Venous: The inferior vena cava is normal in size with greater than 50% respiratory variability, suggesting right atrial pressure of 3 mmHg. IAS/Shunts: No atrial level shunt detected by color flow Doppler.  LEFT VENTRICLE PLAX 2D LVIDd:         3.90 cm   Diastology LVIDs:         2.40 cm   LV e' medial:    7.72 cm/s LV PW:         1.10 cm   LV E/e' medial:  13.5 LV IVS:        1.00 cm   LV e' lateral:   8.16 cm/s LVOT diam:     1.80 cm   LV E/e' lateral: 12.7 LV SV:         42 LV SV Index:   23 LVOT Area:     2.54 cm  RIGHT VENTRICLE RV S prime:     15.90 cm/s TAPSE (M-mode): 2.7 cm LEFT ATRIUM             Index        RIGHT ATRIUM           Index LA diam:        3.30 cm 1.80 cm/m   RA Area:     16.20 cm LA Vol (A2C):   41.6 ml 22.68 ml/m  RA Volume:   44.20 ml  24.09 ml/m LA Vol (A4C):   30.5 ml 16.63 ml/m LA Biplane Vol: 38.2 ml 20.82 ml/m  AORTIC VALVE AV Area (Vmax):    1.46 cm AV Area (Vmean):   1.46 cm AV Area (VTI):     1.52 cm AV Vmax:           160.00 cm/s AV Vmean:          106.000 cm/s AV VTI:            0.276 m AV Peak Grad:      10.2 mmHg AV Mean Grad:      5.0 mmHg LVOT Vmax:         92.10 cm/s LVOT Vmean:        60.800 cm/s LVOT VTI:  0.165 m LVOT/AV VTI ratio: 0.60  AORTA Ao Root diam: 2.60 cm Ao Asc diam:  3.10 cm MITRAL VALVE                TRICUSPID VALVE MV Area (PHT): 4.41 cm     TR Peak grad:   38.9 mmHg MV Decel Time: 172 msec     TR Vmax:        312.00 cm/s MV E velocity: 104.00 cm/s MV A velocity: 106.00 cm/s  SHUNTS MV E/A ratio:  0.98         Systemic VTI:  0.16 m                             Systemic Diam:  1.80 cm Toribio Fuel MD Electronically signed by Toribio Fuel MD Signature Date/Time: 06/01/2024/6:43:11 AM    Final    VAS US  LOWER EXTREMITY VENOUS (DVT) Result Date: 05/31/2024  Lower Venous DVT Study Patient Name:  [name]  Date of Exam:   05/31/2024 Medical Rec #: [id number]     Accession #:    [id number] Date of Birth: [birth year]     Patient Gender: F Patient Age:   89 years Exam Location:  [facility] Procedure:      VAS US  LOWER EXTREMITY VENOUS (DVT) Referring Phys: PROSPER AMPONSAH --------------------------------------------------------------------------------  Indications: Pulmonary embolism.  Risk Factors: Breast cancer, on chemotherapy. Limitations: Poor ultrasound/tissue interface and calcific shadowing. Comparison Study: No previous exams Performing Technologist: Jody Hill RVT, RDMS  Examination Guidelines: A complete evaluation includes B-mode imaging, spectral Doppler, color Doppler, and power Doppler as needed of all accessible portions of each vessel. Bilateral testing is considered an integral part of a complete examination. Limited examinations for reoccurring indications may be performed as noted. The reflux portion of the exam is performed with the patient in reverse Trendelenburg.  +---------+---------------+---------+-----------+----------+-------------------+ RIGHT    CompressibilityPhasicitySpontaneityPropertiesThrombus Aging      +---------+---------------+---------+-----------+----------+-------------------+ CFV      Full           No       Yes                                      +---------+---------------+---------+-----------+----------+-------------------+ SFJ      Full                                                             +---------+---------------+---------+-----------+----------+-------------------+ FV Prox  Full           No       Yes                                       +---------+---------------+---------+-----------+----------+-------------------+ FV Mid   Full           No       Yes                                      +---------+---------------+---------+-----------+----------+-------------------+ FV DistalFull  No       Yes                                      +---------+---------------+---------+-----------+----------+-------------------+ PFV      Full                                         Not well visualized +---------+---------------+---------+-----------+----------+-------------------+ POP      Full           No       Yes                                      +---------+---------------+---------+-----------+----------+-------------------+ PTV      Partial        No       No                   Age Indeterminate   +---------+---------------+---------+-----------+----------+-------------------+ PERO     Full                                                             +---------+---------------+---------+-----------+----------+-------------------+ Pulsatile doppler waveforms seen in lower extemity  +---------+---------------+---------+-----------+----------+-------------------+ LEFT     CompressibilityPhasicitySpontaneityPropertiesThrombus Aging      +---------+---------------+---------+-----------+----------+-------------------+ CFV      Full           No       Yes                                      +---------+---------------+---------+-----------+----------+-------------------+ SFJ      Full                                                             +---------+---------------+---------+-----------+----------+-------------------+ FV Prox  Full           Yes      Yes                                      +---------+---------------+---------+-----------+----------+-------------------+ FV Mid   Full           Yes      Yes                                       +---------+---------------+---------+-----------+----------+-------------------+ FV DistalFull           Yes      Yes                                      +---------+---------------+---------+-----------+----------+-------------------+  PFV      Full                                                             +---------+---------------+---------+-----------+----------+-------------------+ POP      Full           Yes      Yes                                      +---------+---------------+---------+-----------+----------+-------------------+ PTV      Partial        No       No                   age indeterminate -                                                       one of paired       +---------+---------------+---------+-----------+----------+-------------------+ PERO     None           No       No                   Age Indeterminate   +---------+---------------+---------+-----------+----------+-------------------+     Summary: RIGHT: - Findings consistent with age indeterminate deep vein thrombosis involving the right posterior tibial veins.   LEFT: - Findings consistent with age indeterminate deep vein thrombosis involving the left posterior tibial veins, and left peroneal veins.   *See table(s) above for measurements and observations. Electronically signed by Norman Serve on 05/31/2024 at 2:58:28 PM.    Final    CT Angio Chest PE W and/or Wo Contrast Result Date: 05/30/2024 CLINICAL DATA:  Tachycardia and chest pain EXAM: CT ANGIOGRAPHY CHEST WITH CONTRAST TECHNIQUE: Multidetector CT imaging of the chest was performed using the standard protocol during bolus administration of intravenous contrast. Multiplanar CT image reconstructions and MIPs were obtained to evaluate the vascular anatomy. RADIATION DOSE REDUCTION: This exam was performed according to the departmental dose-optimization program which includes automated exposure control, adjustment of the mA and/or kV  according to patient size and/or use of iterative reconstruction technique. CONTRAST:  75mL OMNIPAQUE  IOHEXOL  350 MG/ML SOLN COMPARISON:  Chest x-ray from earlier in the same day FINDINGS: Cardiovascular: Atherosclerotic calcifications of the thoracic aorta are noted. No aneurysmal dilatation or dissection is seen. The heart is mildly enlarged. Coronary calcifications are noted. The pulmonary artery shows a normal branching pattern bilaterally. Scattered filling defects are noted bilaterally in segmental and subsegmental pulmonary arterial branches. RV/LV ratio of 1.4 is noted consistent with right heart strain. Mediastinum/Nodes: Thoracic inlet is within normal limits. No hilar or mediastinal adenopathy is noted. The esophagus as visualized is within normal limits. Lungs/Pleura: Mosaic attenuation is noted throughout both lungs consistent with patchy air trapping. Mild atelectatic changes are noted in the left posterior costophrenic angle. No sizable parenchymal nodule is seen. Upper Abdomen: No acute abnormality. Musculoskeletal: Degenerative changes of the thoracic spine are noted. No acute rib abnormality is noted. Review of the MIP images confirms the  above findings. IMPRESSION: Positive for acute PE with CT evidence of right heart strain (RV/LV Ratio = 1.4) consistent with at least submassive (intermediate risk) PE. The presence of right heart strain has been associated with an increased risk of morbidity and mortality. Please refer to the Code PE Focused order set in EPIC. No other focal abnormality is noted. Aortic Atherosclerosis (ICD10-I70.0). Critical Value/emergent results were called by telephone at the time of interpretation on 05/30/2024 at 7:13 pm to Dr. SID BONING , who verbally acknowledged these results. Electronically Signed   By: Oneil Devonshire M.D.   On: 05/30/2024 19:16   DG Chest Portable 1 View Result Date: 05/30/2024 CLINICAL DATA:  Shortness of breath. History of left breast carcinoma.  EXAM: PORTABLE CHEST 1 VIEW COMPARISON:  PET-CT dated 03/20/2024. FINDINGS: The heart size is within normal limits for technique. Mediastinal contours are within normal limits. Right chest Port-A-Cath tip overlies the lower SVC. Aortic atherosclerosis. No focal consolidation, pleural effusion, or pneumothorax. No acute osseous abnormality. IMPRESSION: 1. No acute cardiopulmonary findings. 2.  Aortic Atherosclerosis (ICD10-I70.0). Electronically Signed   By: Harrietta Sherry M.D.   On: 05/30/2024 15:58    Microbiology: Results for orders placed or performed during the hospital encounter of 05/30/24  Resp panel by RT-PCR (RSV, Flu A&B, Covid) Anterior Nasal Swab     Status: None   Collection Time: 05/30/24  3:32 PM   Specimen: Anterior Nasal Swab  Result Value Ref Range Status   SARS Coronavirus 2 by RT PCR NEGATIVE NEGATIVE Final    Comment: (NOTE) SARS-CoV-2 target nucleic acids are NOT DETECTED.  The SARS-CoV-2 RNA is generally detectable in upper respiratory specimens during the acute phase of infection. The lowest concentration of SARS-CoV-2 viral copies this assay can detect is 138 copies/mL. A negative result does not preclude SARS-Cov-2 infection and should not be used as the sole basis for treatment or other patient management decisions. A negative result may occur with  improper specimen collection/handling, submission of specimen other than nasopharyngeal swab, presence of viral mutation(s) within the areas targeted by this assay, and inadequate number of viral copies(<138 copies/mL). A negative result must be combined with clinical observations, patient history, and epidemiological information. The expected result is Negative.  Fact Sheet for Patients:  BloggerCourse.com  Fact Sheet for Healthcare Providers:  SeriousBroker.it  This test is no t yet approved or cleared by the [place]  FDA and  has been authorized for  detection and/or diagnosis of SARS-CoV-2 by FDA under an Emergency Use Authorization (EUA). This EUA will remain  in effect (meaning this test can be used) for the duration of the COVID-19 declaration under Section 564(b)(1) of the Act, 21 U.S.C.section 360bbb-3(b)(1), unless the authorization is terminated  or revoked sooner.       Influenza A by PCR NEGATIVE NEGATIVE Final   Influenza B by PCR NEGATIVE NEGATIVE Final    Comment: (NOTE) The Xpert Xpress SARS-CoV-2/FLU/RSV plus assay is intended as an aid in the diagnosis of influenza from Nasopharyngeal swab specimens and should not be used as a sole basis for treatment. Nasal washings and aspirates are unacceptable for Xpert Xpress SARS-CoV-2/FLU/RSV testing.  Fact Sheet for Patients: BloggerCourse.com  Fact Sheet for Healthcare Providers: SeriousBroker.it  This test is not yet approved or cleared by the [place]  FDA and has been authorized for detection and/or diagnosis of SARS-CoV-2 by FDA under an Emergency Use Authorization (EUA). This EUA will remain in effect (meaning this test can be used) for the  duration of the COVID-19 declaration under Section 564(b)(1) of the Act, 21 U.S.C. section 360bbb-3(b)(1), unless the authorization is terminated or revoked.     Resp Syncytial Virus by PCR NEGATIVE NEGATIVE Final    Comment: (NOTE) Fact Sheet for Patients: BloggerCourse.com  Fact Sheet for Healthcare Providers: SeriousBroker.it  This test is not yet approved or cleared by the [place]  FDA and has been authorized for detection and/or diagnosis of SARS-CoV-2 by FDA under an Emergency Use Authorization (EUA). This EUA will remain in effect (meaning this test can be used) for the duration of the COVID-19 declaration under Section 564(b)(1) of the Act, 21 U.S.C. section 360bbb-3(b)(1), unless the authorization is  terminated or revoked.  Performed at [facility], [address], [place], [place] [address]   MRSA Next Gen by PCR, Nasal     Status: None   Collection Time: 05/31/24  3:10 AM   Specimen: Nasal Mucosa; Nasal Swab  Result Value Ref Range Status   MRSA by PCR Next Gen NOT DETECTED NOT DETECTED Final    Comment: (NOTE) The GeneXpert MRSA Assay (FDA approved for NASAL specimens only), is one component of a comprehensive MRSA colonization surveillance program. It is not intended to diagnose MRSA infection nor to guide or monitor treatment for MRSA infections. Test performance is not FDA approved in patients less than 23 years old. Performed at [facility], [address], [place], [place] [address]     Labs: CBC: Recent Labs  Lab 05/30/24 1330 05/30/24 1330 05/30/24 1528 05/31/24 0353 06/01/24 0455 06/01/24 1400 06/02/24 0432 06/03/24 0813  WBC 8.6  --  9.1 8.0 9.0  --  12.4* 10.3  NEUTROABS 4.1  --  4.6  --   --   --   --   --   HGB 8.9*   < > 8.4* 7.1* 6.8* 9.3* 9.0* 8.4*  HCT 26.7*  --  26.6* 23.2* 22.4* 29.9* 28.3* 26.5*  MCV 81.2  --  84.7 84.7 85.5  --  85.8 86.6  PLT 500*  --  465* 398 436*  --  458* 410*   < > = values in this interval not displayed.   Basic Metabolic Panel: Recent Labs  Lab 05/30/24 1330 05/30/24 1528 05/31/24 0353 06/01/24 0455 06/02/24 0432  NA 140 138 138 138 136  K 3.8 3.5 3.3* 3.5 3.7  CL 107 108 108 108 104  CO2 22 20* 23 22 23   GLUCOSE 191* 147* 118* 91 129*  BUN 13 13 10 9 9   CREATININE 1.10* 1.02* 0.82 0.75 0.69  CALCIUM  8.2* 8.2* 7.8* 7.9* 8.3*  MG  --   --  1.4* 1.7  --    Liver Function Tests: Recent Labs  Lab 05/30/24 1330 05/30/24 1528  AST 19 23  ALT 21 23  ALKPHOS 64 55  BILITOT 0.2 0.3  PROT 6.0* 5.7*  ALBUMIN 3.2* 2.4*   CBG: Recent Labs  Lab 06/02/24 0737 06/02/24 1213 06/02/24 1637 06/02/24 2045 06/03/24 0827  GLUCAP 131* 126* 123* 153* 165*    Discharge  time spent: greater than 30 minutes.  Signed: Lebron JINNY Cage, MD [facility] 06/03/2024

## 2024-06-05 ENCOUNTER — Telehealth: Payer: Self-pay

## 2024-06-05 ENCOUNTER — Encounter: Payer: Self-pay | Admitting: *Deleted

## 2024-06-05 NOTE — Transitions of Care (Post Inpatient/ED Visit) (Addendum)
   06/05/2024  Name: [name] MRN: [id number] DOB: August 05, 1951  Today's TOC FU Call Status: Today's TOC FU Call Status:: Unsuccessful Call (1st Attempt) Unsuccessful Call (1st Attempt) Date: 06/05/24  Attempted to reach the patient regarding the most recent Inpatient/ED visit. ON chart review it was noted patient was enrolled in CCM program. DC Summary Cc'd to RN CM following this patient's practice.   Follow Up Plan: Additional outreach attempts will be made to reach the patient to complete the Transitions of Care (Post Inpatient/ED visit) call.    Bing Edison MSN, RN RN Case Sales executive Health  VBCI-Population Health Office Hours M-F [phone] Direct Dial: [phone] Main Phone [phone]  Fax: [phone] [email or web address]

## 2024-06-07 ENCOUNTER — Telehealth: Payer: Self-pay

## 2024-06-07 NOTE — Transitions of Care (Post Inpatient/ED Visit) (Signed)
   06/07/2024  Name: [name] MRN: [id number] DOB: [birth year]  Today's TOC FU Call Status: Today's TOC FU Call Status:: Unsuccessful Call (2nd Attempt) Unsuccessful Call (2nd Attempt) Date: 06/07/24  Attempted to reach the patient regarding the most recent Inpatient/ED visit.  Follow Up Plan: Additional outreach attempts will be made to reach the patient to complete the Transitions of Care (Post Inpatient/ED visit) call.    Bing Edison MSN, RN RN Case Sales executive Health  VBCI-Population Health Office Hours M-F [phone] Direct Dial: [phone] Main Phone [phone]  Fax: [phone] [email or web address]

## 2024-06-08 ENCOUNTER — Encounter: Payer: Self-pay | Admitting: Family Medicine

## 2024-06-08 ENCOUNTER — Ambulatory Visit (INDEPENDENT_AMBULATORY_CARE_PROVIDER_SITE_OTHER): Admitting: Family Medicine

## 2024-06-08 ENCOUNTER — Telehealth: Payer: Self-pay

## 2024-06-08 VITALS — BP 128/49 | HR 85 | Ht 61.0 in | Wt 188.0 lb

## 2024-06-08 DIAGNOSIS — R41840 Attention and concentration deficit: Secondary | ICD-10-CM

## 2024-06-08 DIAGNOSIS — Q762 Congenital spondylolisthesis: Secondary | ICD-10-CM | POA: Diagnosis not present

## 2024-06-08 DIAGNOSIS — M47817 Spondylosis without myelopathy or radiculopathy, lumbosacral region: Secondary | ICD-10-CM | POA: Diagnosis not present

## 2024-06-08 DIAGNOSIS — E876 Hypokalemia: Secondary | ICD-10-CM

## 2024-06-08 DIAGNOSIS — G4734 Idiopathic sleep related nonobstructive alveolar hypoventilation: Secondary | ICD-10-CM | POA: Diagnosis not present

## 2024-06-08 DIAGNOSIS — D649 Anemia, unspecified: Secondary | ICD-10-CM

## 2024-06-08 DIAGNOSIS — E1159 Type 2 diabetes mellitus with other circulatory complications: Secondary | ICD-10-CM

## 2024-06-08 DIAGNOSIS — G894 Chronic pain syndrome: Secondary | ICD-10-CM

## 2024-06-08 DIAGNOSIS — M48062 Spinal stenosis, lumbar region with neurogenic claudication: Secondary | ICD-10-CM | POA: Diagnosis not present

## 2024-06-08 DIAGNOSIS — I2699 Other pulmonary embolism without acute cor pulmonale: Secondary | ICD-10-CM | POA: Diagnosis not present

## 2024-06-08 DIAGNOSIS — E1149 Type 2 diabetes mellitus with other diabetic neurological complication: Secondary | ICD-10-CM | POA: Diagnosis not present

## 2024-06-08 DIAGNOSIS — E1142 Type 2 diabetes mellitus with diabetic polyneuropathy: Secondary | ICD-10-CM

## 2024-06-08 DIAGNOSIS — D509 Iron deficiency anemia, unspecified: Secondary | ICD-10-CM

## 2024-06-08 DIAGNOSIS — F988 Other specified behavioral and emotional disorders with onset usually occurring in childhood and adolescence: Secondary | ICD-10-CM

## 2024-06-08 DIAGNOSIS — I152 Hypertension secondary to endocrine disorders: Secondary | ICD-10-CM

## 2024-06-08 MED ORDER — METHYLPHENIDATE HCL 10 MG PO TABS: 20.0000 mg | ORAL_TABLET | Freq: Three times a day (TID) | ORAL | 0 refills | Status: DC

## 2024-06-08 MED ORDER — HYDROCODONE-ACETAMINOPHEN 7.5-325 MG PO TABS: 1.0000 | ORAL_TABLET | ORAL | 0 refills | Status: DC

## 2024-06-08 NOTE — Progress Notes (Unsigned)
 Follow up outpatient visit after Hospitalization  [name] is {Pc accompanied by:5710} Sources of clinical information for visit is/are patient and DC summary. The Discharge Summary for the hospitalization from 05/30/24 to 06/03/24 was reviewed.  Nursing assessment for this office visit was reviewed with the patient for accuracy and revision.   HPI Principle Diagnosis requiring hospitalization: Pulmonary Embolism (submassive) with acute hypoxic respiratory failure and right heart strain on transthoracic echocardiogram      - Bilateral DVT's present  Brief Hospital course summary:  [name] is a 73 y.o. female with medical history significant for recently diagnosed left breast cancer on neoadjuvant Enhertu , T2DM, HTN, HLD, neuropathy, ADHD, GERD, and osteoarthritis who presented to the ED for evaluation of shortness of breath.  Patient reports sudden onset of shortness of breath that progressed with significant dyspnea on exertion. Patient had a follow-up with her oncologist and due to her elevated heart rate and shortness of breath, she was sent directly to the ED for further evaluation.  In the ED, HR 90s-100s, SpO2 95% on room air.  Labs significant for BNP 370, troponin 103-111, negative flu, RSV and COVID test. CTA chest PE study shows acute segmental and subsegmental pulmonary embolism with right heart strain (RV/LV = 1.4). Patient was started on heparin  drip. TRH was consulted for admission.   Doppler noted with bilateral DVT in the right posterior tibial vein, left posterior tibial vein and left peroneal vein  Patient was able to ambulate the hallway with saturations staying well above 90s.   Discharge on p.o. apixaban , for lifelong Follow-up with heme-onc, PCP  Hemoglobin dropped to 7.1--> 6.8 on IV heparin  Noted downward trend since June Anemia panel showed iron  11, sat 6, vitamin B12--> 1,235 Transfused 1 unit of PRBC on 8/14 Gave 1 dose of IV iron  on 8/15 Outpatient  follow-up with heme-onc, PCP  Nocturnal hypoxia Noted to drop sats to the 80s while sleeping Ambulatory sleep study ordered  Breast cancer Diagnosed with left breast cancer, estrogen receptor positive in May 2025 Started neoadjuvant Enhertu  2 months ago Outpatient follow-up with [name]   Medication List at Discharge  amLODIPine  Besy-Benazepril  HCl 10-20 MG 1 capsule Oral Daily  Apixaban  5 MG Take as directed on package: start with two-5mg  tablets twice daily for 7 days. On day 8, switch to one-5mg  tablet twice daily.  Atorvastatin  Calcium  40 mg Oral Daily Patient not taking: Reported on 05/30/2024  BLOOD GLUCOSE MONITOR KIT Dispense based on patient and insurance preference. Use up to four times daily as directed.  Blood Glucose Monitoring Suppl 1 each Does not apply 3 times daily, May substitute to any manufacturer covered by patient's insurance.  Carvedilol  6.25 MG TAKE 1 TABLET(6.25 MG) BY MOUTH TWICE DAILY WITH A MEAL  Cyanocobalamin 1,000 mcg Oral Daily Patient not taking: Reported on 05/30/2024  Diphenoxylate -Atropine  2.5-0.025 MG 1-2 tablets Oral 4 times daily PRN  Fexofenadine  HCl 180 mg Daily PRN  Gabapentin  100 MG TAKE 1 CAPSULE(100 MG) BY MOUTH THREE TIMES DAILY AS NEEDED Patient taking differently: Take 100 mg by mouth 3 (three) times daily as needed (for neuropathy).  hydroCHLOROthiazide  25 MG TAKE 1 TABLET(25 MG) BY MOUTH DAILY  HYDROcodone -Acetaminophen  7.5-325 MG 1 tablet Oral Every 8 hours PRN Patient taking differently: Take 1 tablet by mouth See admin instructions. Take 1 tablet by mouth two to three times a day  Lidocaine -Prilocaine  2.5-2.5 % 1 Application Topical Daily PRN, Use as directed for port access Patient taking differently: Apply 1 Application  topically daily as needed (as directed for port access).  metFORMIN  HCl 500 MG TAKE 1 TABLET(500 MG) BY MOUTH TWICE DAILY WITH A MEAL  Methylphenidate  HCl 20 mg Oral 3 times daily with meals Patient  taking differently: Take 20 mg by mouth See admin instructions. Take 20 mg by mouth two to three times a day with meals  Ondansetron  HCl 8 mg Oral Every 8 hours PRN, Start on the third day after chemotherapy.  Prenat-Fe Carbonyl-FA-Omega 3 28-0.8-235 MG 1 capsule Daily with breakfast  Prochlorperazine  Maleate 10 mg Oral Every 6 hours PRN  Follow up instructions from patient's hospital healthcare providers:  Labs for f/u hypokalemia and hypomagnesemia  ---------------------------------------------------------------------------------------------------------------------- Problems since hospital discharge:  Onset: *** Duration: *** Alleviating factors: *** Aggravating Factors: *** Severity or functional limitations: *** Associated symptoms: *** Quality: *** Pattern: *** Course: *** Location: *** Radiation: *** Context: ***  ---------------------------------------------------------------------------------------------------------------------- Follow up appointments with specialists: Oncology Dr Lanny 06/12/24,  Pulmonology NP Almarie Ferrari 06/29/24, Sleep Study pending  ---------------------------------------------------------------------------------------------------------------------- New medications started during hospitalization: Apixaban  Chronic medications stopped during hospitalization: Naproxen  Patient's Medication List was updated in the EMR: yes --------------------------------------------------------------------------------------------------------------------- Home Health Services: *** Durable Medical Equipment: *** --------------------------------------------------------------------------------------------------------------------- ADLs Independent Needs Assistance Dependent  Bathing     Dressing     Ambulation     Toileting     Eating      IADL Independent Needs Assistance Dependent  Cooking     Housework     Manage Medications     Manage the telephone      Shopping for food, clothes, Meds, etc     Use transportation     Manage Finances

## 2024-06-08 NOTE — Patient Instructions (Signed)
 Refills of the methylphenidate  and hydrocodone  were sent in.  We are rechecking your potassium and magnesium  today.

## 2024-06-08 NOTE — Transitions of Care (Post Inpatient/ED Visit) (Signed)
   06/08/2024  Name: [name] MRN: [id number] DOB: 09-Feb-1951  Today's TOC FU Call Status: Today's TOC FU Call Status:: Unsuccessful Call (3rd Attempt) Unsuccessful Call (3rd Attempt) Date: 06/08/24 [facility] RN spoke with patient who states she saw her PCP today and declined need to participate stating ehr meds were already reviewed today and she didn't feel it's needed.) Patient's Name and Date of Birth confirmed.  [name] RN, CCM [place]  VBCI-Population Health RN Care Manager [phone]

## 2024-06-09 ENCOUNTER — Encounter

## 2024-06-09 ENCOUNTER — Encounter: Payer: Self-pay | Admitting: Family Medicine

## 2024-06-09 ENCOUNTER — Other Ambulatory Visit

## 2024-06-09 ENCOUNTER — Encounter: Payer: Self-pay | Admitting: Hematology

## 2024-06-09 DIAGNOSIS — G4734 Idiopathic sleep related nonobstructive alveolar hypoventilation: Secondary | ICD-10-CM | POA: Insufficient documentation

## 2024-06-09 DIAGNOSIS — E876 Hypokalemia: Secondary | ICD-10-CM | POA: Insufficient documentation

## 2024-06-09 DIAGNOSIS — G894 Chronic pain syndrome: Secondary | ICD-10-CM | POA: Insufficient documentation

## 2024-06-09 DIAGNOSIS — D649 Anemia, unspecified: Secondary | ICD-10-CM | POA: Insufficient documentation

## 2024-06-09 LAB — CBC
Hematocrit: 31.6 % — ABNORMAL LOW (ref 34.0–46.6)
Hemoglobin: 9.7 g/dL — ABNORMAL LOW (ref 11.1–15.9)
MCH: 26.6 pg (ref 26.6–33.0)
MCHC: 30.7 g/dL — ABNORMAL LOW (ref 31.5–35.7)
MCV: 87 fL (ref 79–97)
Platelets: 562 x10E3/uL — ABNORMAL HIGH (ref 150–450)
RBC: 3.65 x10E6/uL — ABNORMAL LOW (ref 3.77–5.28)
RDW: 15.2 % (ref 11.7–15.4)
WBC: 13.4 x10E3/uL — ABNORMAL HIGH (ref 3.4–10.8)

## 2024-06-09 LAB — BASIC METABOLIC PANEL WITH GFR
BUN/Creatinine Ratio: 8 — ABNORMAL LOW (ref 12–28)
BUN: 7 mg/dL — ABNORMAL LOW (ref 8–27)
CO2: 22 mmol/L (ref 20–29)
Calcium: 9.3 mg/dL (ref 8.7–10.3)
Chloride: 100 mmol/L (ref 96–106)
Creatinine, Ser: 0.86 mg/dL (ref 0.57–1.00)
Glucose: 112 mg/dL — ABNORMAL HIGH (ref 70–99)
Potassium: 4.4 mmol/L (ref 3.5–5.2)
Sodium: 140 mmol/L (ref 134–144)
eGFR: 71 mL/min/1.73 (ref >59)

## 2024-06-09 LAB — MICROALBUMIN / CREATININE URINE RATIO
Creatinine, Urine: 58.6 mg/dL
Microalb/Creat Ratio: 60 mg/g{creat} — ABNORMAL HIGH (ref 0–29)
Microalbumin, Urine: 35.3 ug/mL

## 2024-06-09 LAB — MAGNESIUM: Magnesium: 1.8 mg/dL (ref 1.6–2.3)

## 2024-06-09 NOTE — Assessment & Plan Note (Signed)
 Established problem Well Controlled and is at goal of sustained attention allowing her to complete her iADL for which she was responsible prior to the P.E. PDMP review without red flags. No aberrant addictive behaviors No signs of complications, medication side effects, or red flags. Continue current medications

## 2024-06-09 NOTE — Assessment & Plan Note (Addendum)
 New problem Personal history of cT3N1Mo breast cancer which had been treated with Enhertu  Patient had become fatigued with significant decrease in her mobility while on active Chemotx.   Hospitalized 8/12 - 06/03/24 for bilateral P.E. with bilateral distal DVTs and acute hypoxia.   Leasha is able to complete her ADLs now without breathlessness. She denies being breathless walking the 100 feet from the waiting room to exam room using her Rollator  Patient is finishing up her apixaban  10 mg bid induction phase. She has the apixaban  5 mg twice a day available to start maintenance therapy.     She has bilateral leg edema, 1-2+ pitting 2/3 way up shins, L>R.  Left foreleg warm to touch with more diffuse erythema compared to right.  No TTP.  No skin breaks, no weeping. No streaking erythema.   Assessment and Plan Express preference for continuing apixaban  for at least 6 months, possibly long term. F/U with Dr Lanny next week.

## 2024-06-09 NOTE — Assessment & Plan Note (Signed)
 Hypokalemia and hypomagnesemia found during hospitalization.  Likely due to the frequent diarrhea with chemotx.  Lab Results  Component Value Date   NA 140 06/08/2024   K 4.4 06/08/2024   CO2 22 06/08/2024   GLUCOSE 112 (H) 06/08/2024   BUN 7 (L) 06/08/2024   CREATININE 0.86 06/08/2024   CALCIUM  9.3 06/08/2024   EGFR 71 06/08/2024   GFRNONAA >60 06/02/2024   Mg = 1.8 within normal limits  A Resolved P Recheck if concerns arise.

## 2024-06-09 NOTE — Assessment & Plan Note (Signed)
 Established problem Required 1 unit pRBC Xfusion during hospitalization.    Latest Ref Rng & Units 06/08/2024   11:11 AM 06/03/2024    8:13 AM 06/02/2024    4:32 AM  CBC  WBC 3.4 - 10.8 x10E3/uL 13.4  10.3  12.4   Hemoglobin 11.1 - 15.9 g/dL 9.7  8.4  9.0   Hematocrit 34.0 - 46.6 % 31.6  26.5  28.3   Platelets 150 - 450 x10E3/uL 562  410  458     Stable Hgb [name] will see Dr Ileana (Hem-onc) next week.  No interventions recommended at this time.  Suspect elevation of WBC related to PE rather than an acute infection.  Platelet elevation has been an issue since early July. Uncertain if some sort of acute phase reaction, though would not be able to attribute to PE given the lack of temporal relationship

## 2024-06-09 NOTE — Assessment & Plan Note (Signed)
 Established problem. Adequate blood pressure control.  No evidence of new end organ damage.  Tolerating medication without significant adverse effects.  Plan to continue current blood pressure medication regiment.

## 2024-06-09 NOTE — Assessment & Plan Note (Signed)
 New problem Nocturnal desats noted during hospitalization. Concern for OSA by hospitalists and pulmonology  Ms Shear is scheduled to follow up with pulmonology in 2-3 weeks.

## 2024-06-11 ENCOUNTER — Encounter: Payer: Self-pay | Admitting: Hematology

## 2024-06-12 ENCOUNTER — Inpatient Hospital Stay (HOSPITAL_BASED_OUTPATIENT_CLINIC_OR_DEPARTMENT_OTHER): Admitting: Hematology

## 2024-06-12 VITALS — BP 128/60 | HR 82 | Temp 97.8°F | Resp 18 | Ht 61.0 in | Wt 180.8 lb

## 2024-06-12 DIAGNOSIS — R5383 Other fatigue: Secondary | ICD-10-CM | POA: Diagnosis not present

## 2024-06-12 DIAGNOSIS — R531 Weakness: Secondary | ICD-10-CM | POA: Diagnosis not present

## 2024-06-12 DIAGNOSIS — Z7901 Long term (current) use of anticoagulants: Secondary | ICD-10-CM | POA: Diagnosis not present

## 2024-06-12 DIAGNOSIS — T451X5A Adverse effect of antineoplastic and immunosuppressive drugs, initial encounter: Secondary | ICD-10-CM | POA: Insufficient documentation

## 2024-06-12 DIAGNOSIS — Z1721 Progesterone receptor positive status: Secondary | ICD-10-CM | POA: Diagnosis not present

## 2024-06-12 DIAGNOSIS — K521 Toxic gastroenteritis and colitis: Secondary | ICD-10-CM | POA: Diagnosis not present

## 2024-06-12 DIAGNOSIS — I2699 Other pulmonary embolism without acute cor pulmonale: Secondary | ICD-10-CM | POA: Insufficient documentation

## 2024-06-12 DIAGNOSIS — R630 Anorexia: Secondary | ICD-10-CM | POA: Insufficient documentation

## 2024-06-12 DIAGNOSIS — R634 Abnormal weight loss: Secondary | ICD-10-CM | POA: Insufficient documentation

## 2024-06-12 DIAGNOSIS — C50212 Malignant neoplasm of upper-inner quadrant of left female breast: Secondary | ICD-10-CM | POA: Insufficient documentation

## 2024-06-12 DIAGNOSIS — Z1732 Human epidermal growth factor receptor 2 negative status: Secondary | ICD-10-CM | POA: Diagnosis not present

## 2024-06-12 DIAGNOSIS — Z170 Estrogen receptor positive status [ER+]: Secondary | ICD-10-CM | POA: Insufficient documentation

## 2024-06-12 MED ORDER — APIXABAN 5 MG PO TABS: 5.0000 mg | ORAL_TABLET | Freq: Two times a day (BID) | ORAL | 2 refills | Status: DC

## 2024-06-12 MED ORDER — DIPHENOXYLATE-ATROPINE 2.5-0.025 MG PO TABS: 1.0000 | ORAL_TABLET | Freq: Four times a day (QID) | ORAL | 1 refills | Status: AC | PRN

## 2024-06-12 NOTE — Assessment & Plan Note (Addendum)
-  multifocal (UIQ, central, and lateral of left breast), UIQ cT3cN1M0, ER+/PR+/HER2+, central and lateral mass are invasive mammary carcinoma, favor lobular, ER/PR strongly positive, HER2 negative by FISH -presented with screening discovered left breast cancer with nodal metastasis.  - Breast MRI showed 2.5 cm mass in the upper inner quadrant of left breast, additional suspicious clumped linear non-mass enhancement in the retroareolar left measuring 5 cm, and additional small area of non-mass enhancement in the lateral left breast spanning 1.1 cm, 1.3 centimeter abnormal lymph node in the left low axilla, no additional abnormal lymph nodes. -She underwent additional biopsy of the retroareolar and lateral breast mass, which both showed invasive mammary carcinoma, favor lobular, both ER/PR strongly positive, HER2 negative by FISH. -PET negative for distant mets  -she started neoadjuvant Enhertu  on 03/29/2024 - Patient is not interested in surgery or intensive chemotherapy, plan to change her treatment to AI and HER2 antibodies as a maintenance therapy at some point. - Patient was hospitalized on May 30, 2024 for submassive PE, required oxygen.

## 2024-06-13 ENCOUNTER — Encounter: Payer: Self-pay | Admitting: *Deleted

## 2024-06-13 ENCOUNTER — Encounter: Payer: Self-pay | Admitting: Hematology

## 2024-06-13 NOTE — Progress Notes (Signed)
 [facility]   [phone] [phone]   Clinic Follow up Note   Patient Care Team: McDiarmid, Krystal BIRCH, MD as PCP - General [name], MD as Consulting Physician (Ophthalmology) Mavis Purchase, MD as Consulting Physician (Neurosurgery) Jaye Fallow, MD as Consulting Physician (Ophthalmology) Charlsie Josette SAILOR, RN as [facility] Glean Stephane BROCKS, RN (Inactive) as Oncology Nurse Navigator Tyree Nanetta SAILOR, RN as Oncology Nurse Navigator Lanny Callander, MD as Consulting Physician (Hematology)  Date of Service:  06/12/2024  CHIEF COMPLAINT: f/u of left breast cancer  CURRENT THERAPY:  Enhertu , on hold for now  Oncology History   Malignant neoplasm of upper-inner quadrant of left breast in female, estrogen receptor positive (HCC) -multifocal (UIQ, central, and lateral of left breast), UIQ cT3cN1M0, ER+/PR+/HER2+, central and lateral mass are invasive mammary carcinoma, favor lobular, ER/PR strongly positive, HER2 negative by FISH -presented with screening discovered left breast cancer with nodal metastasis.  - Breast MRI showed 2.5 cm mass in the upper inner quadrant of left breast, additional suspicious clumped linear non-mass enhancement in the retroareolar left measuring 5 cm, and additional small area of non-mass enhancement in the lateral left breast spanning 1.1 cm, 1.3 centimeter abnormal lymph node in the left low axilla, no additional abnormal lymph nodes. -She underwent additional biopsy of the retroareolar and lateral breast mass, which both showed invasive mammary carcinoma, favor lobular, both ER/PR strongly positive, HER2 negative by FISH. -PET negative for distant mets  -she started neoadjuvant Enhertu  on 03/29/2024 - Patient is not interested in surgery or intensive chemotherapy, plan to change her treatment to AI and HER2 antibodies as a maintenance therapy at some point. - Patient was hospitalized on May 30, 2024 for  submassive PE, required oxygen.  Assessment & Plan ER/PR positive, HER2 positive breast cancer on active treatment Completed 3 cycles of chemotherapy with significant side effects after last cycle. MRI of the breast planned to evaluate treatment response. Discussed switching to anti-estrogen therapy with trastuzumab , which is less intense than chemotherapy and has fewer side effects such as hair loss, diarrhea, and fatigue. She is considering this option. - Order MRI of the breast to evaluate treatment response - Discuss anti-estrogen therapy with trastuzumab  as a potential treatment option - Allow a break from chemotherapy to facilitate recovery  Chemotherapy-induced diarrhea Experiences diarrhea as a side effect of chemotherapy, managed with Lomedia, taking 6 to 8 tablets daily. No blood in stool, and diarrhea is less severe with medication. 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 On anticoagulation therapy with Eliquis . Emphasized importance of adherence and discussed potential side effects such as bleeding and anemia. - Continue Eliquis  5 mg twice daily - Refill prescription for Eliquis  - Monitor for signs of bleeding and anemia  Unintentional weight loss Experienced an 8-pound weight loss, likely due to decreased appetite and changes in taste associated with chemotherapy. Liquid nutrition supplements like Ensure or Boost not tolerated due to taste, and yogurt exacerbates diarrhea. - Encourage increased caloric intake - Consider homemade protein shakes with protein powder and fruits  Plan - She is recovering well from her recent hospital admission for PE, will continue Eliquis , she does not require oxygen. - Repeat breast MRI to evaluate her response to Enhertu  - Follow-up after MRI, we will likely change her to AI and  trastuzumab    SUMMARY OF ONCOLOGIC HISTORY: Oncology History  Malignant neoplasm of  upper-inner quadrant of left breast in female, estrogen receptor positive (HCC)  02/25/2024 Cancer Staging   Staging form: Breast, AJCC 8th Edition - Clinical stage from 02/25/2024: Stage IB (cT1c, cN1, cM0, G3, ER+, PR+, HER2+) - Signed by Lanny Callander, MD on 03/07/2024 Stage prefix: Initial diagnosis Histologic grading system: 3 grade system   03/07/2024 Initial Diagnosis   Malignant neoplasm of upper-inner quadrant of left breast in female, estrogen receptor positive (HCC)   03/29/2024 -  Chemotherapy   Patient is on Treatment Plan : BREAST Fam-Trastuzumab Deruxtecan-nxki  (Enhertu ) (5.4) q21d        Discussed the use of AI scribe software for clinical note transcription with the patient, who gave verbal consent to proceed.  History of Present Illness [name] is a 73 year old female with breast cancer who presents for follow-up.  She has completed four chemotherapy treatments, starting in June. She experiences fatigue and has lost eight pounds recently. Her appetite is improving, but food tastes different, and liquid nutrition supplements are unpalatable. Diarrhea persists, though less severe, managed with Lomedia six to eight times daily. There is no blood in her stool. Oxygen levels are stable at 99% without supplemental oxygen. She is on Eliquis  5 mg twice daily for pulmonary emboli.     All other systems were reviewed with the patient and are negative.  MEDICAL HISTORY:  Past Medical History:  Diagnosis Date   Abnormal mammogram of both breasts 02/14/2024   See 02/09/24 mammogram report     Acute MEE (middle ear effusion) 06/07/2014   Acute recurrent sinusitis 12/21/2014   Acute sinusitis 12/21/2014   Allergy    Anemia    hx   Asthma, intermittent 11/02/2018   ATTENTION DEFICIT, W/O HYPERACTIVITY 12/16/2006   Bilateral carpal tunnel syndrome 11/20/2016   Cataract    Cervical spondylosis  with myelopathy, History of 04/28/2012   COLON POLYP 12/16/2006   (02/03/12) Surgical [Pathology], sigmoid colon, polyp HYPERPLASTIC POLYP(S) AND POLYPOID FRAGMETNS OF BENIGN COLONIC MUCOSA WITH INTRAMUCOSAL LYMPHOID AGGREGATES. NO ADENOMATOUS CHANGE OR MALIGNANCY IDENTIFIED.    Demand ischemia (HCC) 05/30/2024   Associated with acute pulmonary embolism with right heart strain   DIABETES MELLITUS II, UNCOMPLICATED 12/16/2006   Fall 10/18/2020   GERD (gastroesophageal reflux disease)    H/O abnormal mammogram 07/19/2009   S/P Breast Biopsy [facility], [place], 07/2009): Benign findings.     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/20/2016   OSTEOARTHRITIS, MULTI SITES 12/16/2006   Perennial allergic rhinitis with seasonal variation 12/16/2006        PONV (postoperative nausea and vomiting)    last surgery only   Recurrent maxillary sinusitis    SACROILIITIS, HISTORY OF 01/08/2009   Qualifier: History of  By: McDiarmid MD, Todd     Seasonal allergies    Seborrheic keratosis 01/15/2016  SKELETAL HYPEROSTOSIS 03/29/2008   Ulcer    Uterine fibroid 02/02/2011   TVUS: 4cm fibroid w/ submucosal component, bil.hydrosalpinges, unable measurable endometrium - 08/18/2004    Vulvovaginitis due to Candida 10/02/2022    SURGICAL HISTORY: Past Surgical History:  Procedure Laterality  Date   ANTERIOR CERVICAL DECOMP/DISCECTOMY FUSION  04/28/2012   Procedure: ANTERIOR CERVICAL DECOMPRESSION/DISCECTOMY FUSION 3 LEVELS;  Surgeon: Reyes JONETTA Budge, MD;  Location: [facility];  Service: Neurosurgery;  Laterality: N/A;  Cervical three-four,Cervical four-five,Cervical five-six anterior cervical decompression with fusion interbody prothesis plating and bonegraft   BREAST BIOPSY  07/19/2009   S/P Breast Biopsy [facility], [place], 07/2009): Benign findings.  (10/27/2010)   BREAST BIOPSY Left 02/25/2024   US  LT BREAST BX W LOC DEV 1ST LESION IMG BX SPEC US  GUIDE 02/25/2024 [facility]   BREAST BIOPSY Left 02/25/2024   US  LT BREAST BX W LOC DEV EA ADD LESION IMG BX SPEC US  GUIDE 02/25/2024 [facility]   BREAST BIOPSY Right 03/02/2024   US  RT BREAST BX W LOC DEV 1ST LESION IMG BX SPEC US  GUIDE 03/02/2024 [facility]   BREAST LUMPECTOMY Left    CARPAL TUNNEL RELEASE Left 12/10/2016   Procedure: LEFT CARPAL TUNNEL RELEASE;  Surgeon: Murrell Kuba, MD;  Location: [facility];  Service: Orthopedics;  Laterality: Left;   COLONOSCOPY W/ POLYPECTOMY  12/17/2000   colonoscopy (Dr Kristie) int. hemorrhoids &  nonneoplatic colon polyps - 01/10/2001,    COLONOSCOPY W/ POLYPECTOMY  01/18/2012   Dr Albertus (GI).sigmoid colon, hyperlastic polyp   ENDOMETRIAL BIOPSY  08/19/2004   Endometrial Biopsy - 09/01/2004,   IR IMAGING GUIDED PORT INSERTION  03/28/2024   NM MYOVIEW  LTD  04/18/2004   Cardiolite:EF63%, no ischemia - 05/01/2004,    REPLACEMENT TOTAL KNEE BILATERAL      I have reviewed the social history and family history with the patient and they are unchanged from previous note.  ALLERGIES:  is allergic to januvia  [sitagliptin ], jardiance  [empagliflozin ], celecoxib, and diclofenac-misoprostol.  MEDICATIONS:  Current Outpatient Medications  Medication Sig Dispense Refill   amLODipine -benazepril  (LOTREL) 10-20 MG capsule TAKE 1 CAPSULE BY MOUTH DAILY 90  capsule 3   apixaban  (ELIQUIS ) 5 MG TABS tablet Take 1 tablet (5 mg total) by mouth 2 (two) times daily. 180 tablet 2   atorvastatin  (LIPITOR) 40 MG tablet Take 1 tablet (40 mg total) by mouth daily. 90 tablet 3   blood glucose meter kit and supplies KIT Dispense based on patient and insurance preference. Use up to four times daily as directed. 1 each 0   Blood Glucose Monitoring Suppl DEVI 1 each by Does not apply route in the morning, at noon, and at bedtime. May substitute to any manufacturer covered by patient's insurance. 1 each 0   carvedilol  (COREG ) 6.25 MG tablet TAKE 1 TABLET(6.25 MG) BY MOUTH TWICE DAILY WITH A MEAL 180 tablet 3   fexofenadine  (ALLEGRA ) 180 MG tablet Take 180 mg by mouth daily as needed for allergies or rhinitis.     gabapentin  (NEURONTIN ) 100 MG capsule TAKE 1 CAPSULE(100 MG) BY MOUTH THREE TIMES DAILY AS NEEDED (Patient taking differently: Take 100 mg by mouth 3 (three) times daily as needed (for neuropathy).) 270 capsule 3   hydrochlorothiazide  (HYDRODIURIL ) 25 MG tablet TAKE 1 TABLET(25 MG) BY MOUTH DAILY 90 tablet 3   HYDROcodone -acetaminophen  (NORCO) 7.5-325 MG tablet Take 1 tablet by mouth See admin instructions. Take 1 tablet by mouth two to three times a day 90  tablet 0   lidocaine -prilocaine  (EMLA ) cream Apply 1 Application topically daily as needed. Use as directed for port access (Patient taking differently: Apply 1 Application topically daily as needed (as directed for port access).) 30 g 3   metFORMIN  (GLUCOPHAGE -XR) 500 MG 24 hr tablet TAKE 1 TABLET(500 MG) BY MOUTH TWICE DAILY WITH A MEAL 180 tablet 3   methylphenidate  (RITALIN ) 10 MG tablet Take 2 tablets (20 mg total) by mouth 3 (three) times daily with meals. 180 tablet 0   ondansetron  (ZOFRAN ) 8 MG tablet Take 1 tablet (8 mg total) by mouth every 8 (eight) hours as needed for nausea or vomiting. Start on the third day after chemotherapy. 30 tablet 1   Prenat-Fe Carbonyl-FA-Omega 3 (ONE-A-DAY WOMENS  PRENATAL 1) 28-0.8-235 MG CAPS Take 1 capsule by mouth daily with breakfast.     prochlorperazine  (COMPAZINE ) 10 MG tablet Take 1 tablet (10 mg total) by mouth every 6 (six) hours as needed for nausea or vomiting. 30 tablet 1   vitamin B-12 (CYANOCOBALAMIN) 1000 MCG tablet Take 1 tablet (1,000 mcg total) by mouth daily. 180 tablet 3   diphenoxylate -atropine  (LOMOTIL ) 2.5-0.025 MG tablet Take 1-2 tablets by mouth 4 (four) times daily as needed for diarrhea or loose stools. 90 tablet 1   No current facility-administered medications for this visit.    PHYSICAL EXAMINATION: ECOG PERFORMANCE STATUS: 2 - Symptomatic, <50% confined to bed  Vitals:   06/12/24 1100  BP: 128/60  Pulse: 82  Resp: 18  Temp: 97.8 F (36.6 C)  SpO2: 99%   Wt Readings from Last 3 Encounters:  06/12/24 180 lb 12.8 oz (82 kg)  06/08/24 188 lb (85.3 kg)  05/30/24 186 lb 11.7 oz (84.7 kg)     GENERAL:alert, no distress and comfortable SKIN: skin color, texture, turgor are normal, no rashes or significant lesions EYES: normal, Conjunctiva are pink and non-injected, sclera clear NECK: supple, thyroid  normal size, non-tender, without nodularity LYMPH:  no palpable lymphadenopathy in the cervical, axillary  LUNGS: clear to auscultation and percussion with normal breathing effort HEART: regular rate & rhythm and no murmurs and no lower extremity edema ABDOMEN:abdomen soft, non-tender and normal bowel sounds Musculoskeletal:no cyanosis of digits and no clubbing  NEURO: alert & oriented x 3 with fluent speech, no focal motor/sensory deficits  Physical Exam    LABORATORY DATA:  I have reviewed the data as listed    Latest Ref Rng & Units 06/08/2024   11:11 AM 06/03/2024    8:13 AM 06/02/2024    4:32 AM  CBC  WBC 3.4 - 10.8 x10E3/uL 13.4  10.3  12.4   Hemoglobin 11.1 - 15.9 g/dL 9.7  8.4  9.0   Hematocrit 34.0 - 46.6 % 31.6  26.5  28.3   Platelets 150 - 450 x10E3/uL 562  410  458         Latest Ref Rng &  Units 06/08/2024   11:11 AM 06/02/2024    4:32 AM 06/01/2024    4:55 AM  CMP  Glucose 70 - 99 mg/dL 887  870  91   BUN 8 - 27 mg/dL 7  9  9    Creatinine 0.57 - 1.00 mg/dL 9.13  9.30  9.24   Sodium 134 - 144 mmol/L 140  136  138   Potassium 3.5 - 5.2 mmol/L 4.4  3.7  3.5   Chloride 96 - 106 mmol/L 100  104  108   CO2 20 - 29 mmol/L 22  23  22  Calcium  8.7 - 10.3 mg/dL 9.3  8.3  7.9       RADIOGRAPHIC STUDIES: I have personally reviewed the radiological images as listed and agreed with the findings in the report. No results found.    Orders Placed This Encounter  Procedures   MR BREAST BILATERAL W WO CONTRAST INC CAD    mcr epic Pf:5/25 Dx: Malignant neoplasm of upper-inner quadrant of left breast in female, estrogen receptor positive WT 180/ HT:5'2 NO CLAUS/(walker) NEEDS NO METAL IN EYES  BULLETS, BB'S/NO METAL DEVICES SUCH AS A PACEMAKER, DEFRIBULLATOR, ANUERYSM CLIPS/ STENTS /no GLUCOSE MONITOR /NO STIMULATOR OR INJECTORS/ NO SX TO BRAIN,HEART,EYES OR EARS/  PATIENT IS AWARE OF 75.00 NO SHOW/CANCEL FEE sk and pt    Standing Status:   Future    Expected Date:   06/26/2024    Expiration Date:   06/12/2025    If indicated for the ordered procedure, I authorize the administration of contrast media per Radiology protocol:   Yes    What is the patient's sedation requirement?:   No Sedation    Does the patient have a pacemaker or implanted devices?:   No    Preferred imaging location?:   [facility] (table limit-550lbs)   All questions were answered. The patient knows to call the clinic with any problems, questions or concerns. No barriers to learning was detected. The total time spent in the appointment was 30 minutes, including review of chart and various tests results, discussions about plan of care and coordination of care plan     Onita Mattock, MD 06/12/2024

## 2024-06-20 ENCOUNTER — Inpatient Hospital Stay

## 2024-06-20 ENCOUNTER — Inpatient Hospital Stay: Admitting: Hematology

## 2024-06-20 ENCOUNTER — Inpatient Hospital Stay: Admitting: Dietician

## 2024-06-27 ENCOUNTER — Other Ambulatory Visit: Payer: Self-pay | Admitting: Hematology

## 2024-06-29 ENCOUNTER — Ambulatory Visit: Admitting: Primary Care

## 2024-06-29 ENCOUNTER — Encounter: Payer: Self-pay | Admitting: Primary Care

## 2024-06-29 VITALS — BP 126/58 | HR 82 | Temp 97.9°F | Ht 62.0 in | Wt 179.0 lb

## 2024-06-29 DIAGNOSIS — Z23 Encounter for immunization: Secondary | ICD-10-CM | POA: Diagnosis not present

## 2024-06-29 DIAGNOSIS — I2609 Other pulmonary embolism with acute cor pulmonale: Secondary | ICD-10-CM

## 2024-06-29 DIAGNOSIS — G4734 Idiopathic sleep related nonobstructive alveolar hypoventilation: Secondary | ICD-10-CM | POA: Diagnosis not present

## 2024-06-29 NOTE — Patient Instructions (Addendum)
  YOUR PLAN: -PULMONARY EMBOLISM: A pulmonary embolism is a blood clot in the lungs that can cause shortness of breath and heart strain. You will continue taking Eliquis  as prescribed to prevent further clots. An echocardiogram will be done in November to reassess your heart, and a walking test will be performed to check your oxygen levels.  -SLEEP-DISORDERED BREATHING: Sleep-disordered breathing includes conditions like sleep apnea, where breathing repeatedly stops and starts during sleep. You will perform an overnight oximetry test at home to evaluate your oxygen levels during sleep. If the test is abnormal, you will proceed with a scheduled sleep study in October. If the test is normal, the sleep study will be canceled.  -BREAST CANCER FOLLOW-UP: Continue to follow up with your oncologist. Your next appointment is on September 25th.  INSTRUCTIONS: Please continue taking Eliquis  as prescribed. Schedule an echocardiogram in November to reassess your heart strain. Perform the overnight oximetry test at home as planned. If the oximetry test is abnormal, proceed with the sleep study in October; if normal, cancel the sleep study. Continue your follow-up with oncology, with your next appointment on September 25th.  Follow-up: December with Dr.Dewald- then likely just as needed

## 2024-06-29 NOTE — Progress Notes (Signed)
 @Patient  ID: [name], female    DOB: 1951-06-20, 73 y.o.   MRN: [id number]  Chief Complaint  Patient presents with   Follow-up    F/u hosp.-denies sob, occass. Cough clear    Referring provider: McDiarmid, Krystal BIRCH, MD  HPI: 73 year old female, former smoker quit in 981. PMH significant for HTN, acute pulmonary embolism, hyperlipidemia, type 2 diabetes, breast cancer.   06/29/2024- Interim hx  Discussed the use of AI scribe software for clinical note transcription with the patient, who gave verbal consent to proceed.  History of Present Illness [name] is a 73 year old female with a history of breast cancer who presents for a hospital follow-up after a pulmonary embolism. She was referred by her oncologist for evaluation of her pulmonary embolism and potential sleep apnea.  She was hospitalized from August 12th to 16th due to shortness of breath and elevated heart rate. A CT scan revealed a pulmonary embolism with strain on her heart. She was treated with a heparin  drip and discharged on Eliquis , starting with two 5 mg tablets daily for one week, then one tablet twice daily. She was told she would need to stay on blood thinners to prevent further clots, and there was discussion that the blood clot may have been related to her cancer treatment.  During her hospital stay, oxygen levels dropped into the 80s while sleeping, raising concerns about sleep apnea. No current issues with snoring, waking up gasping, or feeling excessively tired during the day. Her sleep pattern includes going to bed between 10 PM and midnight, occasionally waking once or twice to use the restroom, and feeling rested upon waking. She sometimes naps during the day but does not find it necessary.  Her past medical history includes breast cancer, for which she follows up with her oncologist. She has completed her cancer treatment. There is no mention of a hematologist in her care team.  In terms of her social  history, she shares a room with someone who notes that she used to snore heavily but no longer does. She has not experienced significant weight loss that might explain the change in her snoring pattern.  No current shortness of breath, chest pain, coughing up blood, or waking up gasping or choking. Reports feeling rested upon waking and does not feel excessively tired during the day.   Allergies  Allergen Reactions   Januvia  [Sitagliptin ] Other (See Comments)    Epigastric abdominal pain   Jardiance  [Empagliflozin ] Other (See Comments)    Lightheadedness, near-syncope, extreme yeast problems   Celecoxib Palpitations   Diclofenac-Misoprostol Palpitations and Other (See Comments)    Arthrotec    Immunization History  Administered Date(s) Administered   Fluad Quad(high Dose 65+) 07/05/2019, 07/19/2020, 08/27/2022   Fluad Trivalent(High Dose 65+) 08/18/2023   Influenza Split 08/08/2012   Influenza Whole 07/19/2006, 09/14/2007, 07/27/2008, 08/12/2010   Influenza,inj,Quad PF,6+ Mos 07/31/2013, 08/10/2014, 08/13/2015, 07/30/2016, 07/27/2017, 06/23/2018, 08/04/2021   Moderna Covid-19 Fall Seasonal Vaccine 45yrs & older 07/31/2023   Moderna Covid-19 Seasonal Vaccine 6 months thru 74years of age 72/19/2023   [facility] SARS-COV2 Booster Vaccination 07/30/2021   Moderna Sars-Covid-2 Vaccination 12/01/2019, 12/29/2019, 08/02/2020, 03/12/2021   PFIZER Comirnaty(Gray Top)Covid-19 Tri-Sucrose Vaccine 06/19/2022   Pneumococcal Conjugate-13 01/24/2015   Pneumococcal Polysaccharide-23 10/19/1994, 04/29/2012, 09/16/2017   Td 01/17/2002   Zoster, Live 03/03/2012    Past Medical History:  Diagnosis Date   Abnormal mammogram of both breasts 02/14/2024   See 02/09/24 mammogram report  Acute MEE (middle ear effusion) 06/07/2014   Acute recurrent sinusitis 12/21/2014   Acute sinusitis 12/21/2014   Allergy    Anemia    hx   Asthma, intermittent 11/02/2018   ATTENTION DEFICIT, W/O HYPERACTIVITY  12/16/2006   Bilateral carpal tunnel syndrome 11/20/2016   Cataract    Cervical spondylosis with myelopathy, History of 04/28/2012   COLON POLYP 12/16/2006   (02/03/12) Surgical [Pathology], sigmoid colon, polyp HYPERPLASTIC POLYP(S) AND POLYPOID FRAGMETNS OF BENIGN COLONIC MUCOSA WITH INTRAMUCOSAL LYMPHOID AGGREGATES. NO ADENOMATOUS CHANGE OR MALIGNANCY IDENTIFIED.    Demand ischemia (HCC) 05/30/2024   Associated with acute pulmonary embolism with right heart strain   DIABETES MELLITUS II, UNCOMPLICATED 12/16/2006   Fall 10/18/2020   GERD (gastroesophageal reflux disease)    H/O abnormal mammogram 07/19/2009   S/P Breast Biopsy [facility], [place], 07/2009): Benign findings.     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/20/2016   OSTEOARTHRITIS, MULTI SITES 12/16/2006   Perennial allergic rhinitis with seasonal variation 12/16/2006        PONV (postoperative nausea and vomiting)    last surgery only   Recurrent maxillary sinusitis    SACROILIITIS, HISTORY OF 01/08/2009   Qualifier: History of  By: McDiarmid MD, Todd     Seasonal allergies    Seborrheic keratosis 01/15/2016   SKELETAL HYPEROSTOSIS 03/29/2008   Ulcer    Uterine fibroid 02/02/2011   TVUS: 4cm fibroid w/ submucosal component, bil.hydrosalpinges, unable measurable endometrium - 08/18/2004    Vulvovaginitis due  to Candida 10/02/2022    Tobacco History: Social History   Tobacco Use  Smoking Status Former   Current packs/day: 0.00   Average packs/day: 1 pack/day for 4.0 years (4.0 ttl pk-yrs)   Types: Cigarettes   Start date: 10/20/1975   Quit date: 10/20/1979   Years since quitting: 44.7  Smokeless Tobacco Never   Counseling given: Not Answered   Outpatient Medications Prior to Visit  Medication Sig Dispense Refill   amLODipine -benazepril  (LOTREL) 10-20 MG capsule TAKE 1 CAPSULE BY MOUTH DAILY 90 capsule 3   apixaban  (ELIQUIS ) 5 MG TABS tablet Take 1 tablet (5 mg total) by mouth 2 (two) times daily. 180 tablet 2   carvedilol  (COREG ) 6.25 MG tablet TAKE 1 TABLET(6.25 MG) BY MOUTH TWICE DAILY WITH A MEAL 180 tablet 3   diphenoxylate -atropine  (LOMOTIL ) 2.5-0.025 MG tablet Take 1-2 tablets by mouth 4 (four) times daily as needed for diarrhea or loose stools. 90 tablet 1   fexofenadine  (ALLEGRA ) 180 MG tablet Take 180 mg by mouth daily as needed for allergies or rhinitis.     gabapentin  (NEURONTIN ) 100 MG capsule TAKE 1 CAPSULE(100 MG) BY MOUTH THREE TIMES DAILY AS NEEDED 270 capsule 3   HYDROcodone -acetaminophen  (NORCO) 7.5-325 MG tablet Take 1 tablet by mouth See admin instructions. Take 1 tablet by mouth two to three  times a day 90 tablet 0   lidocaine -prilocaine  (EMLA ) cream Apply 1 Application topically daily as needed. Use as directed for port access 30 g 3   metFORMIN  (GLUCOPHAGE -XR) 500 MG 24 hr tablet TAKE 1 TABLET(500 MG) BY MOUTH TWICE DAILY WITH A MEAL 180 tablet 3   methylphenidate  (RITALIN ) 10 MG tablet Take 2 tablets (20 mg total) by mouth 3 (three) times daily with meals. 180 tablet 0   Prenat-Fe Carbonyl-FA-Omega 3 (ONE-A-DAY WOMENS PRENATAL 1) 28-0.8-235 MG CAPS Take 1 capsule by mouth daily with breakfast.     vitamin B-12 (CYANOCOBALAMIN) 1000 MCG tablet Take 1 tablet (1,000 mcg total) by mouth daily. 180 tablet 3   atorvastatin  (LIPITOR) 40 MG tablet Take 1 tablet (40 mg total)  by mouth daily. (Patient not taking: Reported on 06/29/2024) 90 tablet 3   blood glucose meter kit and supplies KIT Dispense based on patient and insurance preference. Use up to four times daily as directed. (Patient not taking: Reported on 06/29/2024) 1 each 0   Blood Glucose Monitoring Suppl DEVI 1 each by Does not apply route in the morning, at noon, and at bedtime. May substitute to any manufacturer covered by patient's insurance. (Patient not taking: Reported on 06/29/2024) 1 each 0   hydrochlorothiazide  (HYDRODIURIL ) 25 MG tablet TAKE 1 TABLET(25 MG) BY MOUTH DAILY (Patient not taking: Reported on 06/29/2024) 90 tablet 3   No facility-administered medications prior to visit.   Review of Systems  Review of Systems  Constitutional: Negative.   Respiratory: Negative.     Physical Exam  BP (!) 126/58 (BP Location: Left Arm, Patient Position: Sitting, Cuff Size: Large)   Pulse 82   Temp 97.9 F (36.6 C) (Oral)   Ht 5' 2 (1.575 m)   Wt 179 lb (81.2 kg)   SpO2 99%   BMI 32.74 kg/m  Physical Exam Constitutional:      Appearance: Normal appearance. She is well-developed.  HENT:     Head: Normocephalic and atraumatic.     Mouth/Throat:     Mouth: Mucous membranes are moist.     Pharynx: Oropharynx is clear.  Eyes:     Pupils: Pupils are equal, round, and reactive to light.  Cardiovascular:     Rate and Rhythm: Normal rate and regular rhythm.     Heart sounds: Normal heart sounds. No murmur heard. Pulmonary:     Effort: Pulmonary effort is normal. No respiratory distress.     Breath sounds: Normal breath sounds. No wheezing or rhonchi.  Abdominal:     General: Bowel sounds are normal.     Palpations: Abdomen is soft.     Tenderness: There is no abdominal tenderness.  Musculoskeletal:        General: Normal range of motion.     Cervical back: Normal range of motion and neck supple.  Skin:    General: Skin is warm and dry.     Findings: No erythema or rash.  Neurological:      General: No focal deficit present.     Mental Status: She is alert and oriented to person, place, and time. Mental status is at baseline.  Psychiatric:        Mood and Affect: Mood normal.        Behavior: Behavior normal.        Thought Content: Thought content normal.        Judgment: Judgment normal.     Lab Results:  CBC    Component Value Date/Time  WBC 13.4 (H) 06/08/2024 1111   WBC 10.3 06/03/2024 0813   RBC 3.65 (L) 06/08/2024 1111   RBC 3.06 (L) 06/03/2024 0813   HGB 9.7 (L) 06/08/2024 1111   HCT 31.6 (L) 06/08/2024 1111   PLT 562 (H) 06/08/2024 1111   MCV 87 06/08/2024 1111   MCH 26.6 06/08/2024 1111   MCH 27.5 06/03/2024 0813   MCHC 30.7 (L) 06/08/2024 1111   MCHC 31.7 06/03/2024 0813   RDW 15.2 06/08/2024 1111   LYMPHSABS 2.6 05/30/2024 1528   MONOABS 1.2 (H) 05/30/2024 1528   EOSABS 0.5 05/30/2024 1528   BASOSABS 0.1 05/30/2024 1528    BMET    Component Value Date/Time   NA 140 06/08/2024 1111   K 4.4 06/08/2024 1111   CL 100 06/08/2024 1111   CO2 22 06/08/2024 1111   GLUCOSE 112 (H) 06/08/2024 1111   GLUCOSE 129 (H) 06/02/2024 0432   BUN 7 (L) 06/08/2024 1111   CREATININE 0.86 06/08/2024 1111   CREATININE 1.10 (H) 05/30/2024 1330   CREATININE 0.73 01/15/2016 1436   CALCIUM  9.3 06/08/2024 1111   GFRNONAA >60 06/02/2024 0432   GFRNONAA 53 (L) 05/30/2024 1330   GFRNONAA 86 01/15/2016 1436   GFRAA 104 10/17/2020 1552   GFRAA >89 01/15/2016 1436    BNP    Component Value Date/Time   BNP 369.8 (H) 05/30/2024 1528    ProBNP No results found for: PROBNP  Imaging: DG CHEST PORT 1 VIEW Result Date: 06/02/2024 CLINICAL DATA:  Cough. EXAM: PORTABLE CHEST 1 VIEW COMPARISON:  Radiograph and CT 05/30/2024 FINDINGS: Right chest port in place. Normal heart size with stable mediastinal contours. Aortic atherosclerosis. No focal airspace disease, pleural effusion or pneumothorax. No pulmonary edema. No acute osseous findings. IMPRESSION: No acute chest  findings. Electronically Signed   By: Andrea Gasman M.D.   On: 06/02/2024 15:36   ECHOCARDIOGRAM COMPLETE Result Date: 06/01/2024    ECHOCARDIOGRAM REPORT   Patient Name:   [name] Date of Exam: 05/31/2024 Medical Rec #:  [id number]    Height:       61.0 in Accession #:    [id number]   Weight:       186.7 lb Date of Birth:  March 28, 1951    BSA:          1.834 m Patient Age:    73 years     BP:           105/44 mmHg Patient Gender: F            HR:           88 bpm. Exam Location:  Inpatient Procedure: 2D Echo, Color Doppler and Cardiac Doppler (Both Spectral and Color            Flow Doppler were utilized during procedure). Indications:    Pulmonary embolus  History:        Patient has prior history of Echocardiogram examinations, most                 recent 02/19/2024. Pulmonary embolism.  Sonographer:    Benard Stallion Referring Phys: [id number] PROSPER M AMPONSAH IMPRESSIONS  1. Left ventricular ejection fraction, by estimation, is 65 to 70%. The left ventricle has normal function. The left ventricle has no regional wall motion abnormalities. There is mild concentric left ventricular hypertrophy. Left ventricular diastolic parameters were normal. There is the interventricular septum is flattened in systole, consistent with right ventricular pressure overload.  2. McConnell's sign present (  RV dysfunction with apical sparing) suggestive of acute PE. Right ventricular systolic function is moderately reduced. The right ventricular size is moderately enlarged. There is mildly elevated pulmonary artery systolic pressure. The estimated right ventricular systolic pressure is 41.9 mmHg.  3. Right atrial size was moderately dilated.  4. The mitral valve is normal in structure. No evidence of mitral valve regurgitation. No evidence of mitral stenosis.  5. The aortic valve is bicuspid. There is mild calcification of the aortic valve. Aortic valve regurgitation is not visualized. Aortic valve sclerosis/calcification is  present, without any evidence of aortic stenosis.  6. The inferior vena cava is normal in size with greater than 50% respiratory variability, suggesting right atrial pressure of 3 mmHg. FINDINGS  Left Ventricle: Left ventricular ejection fraction, by estimation, is 65 to 70%. The left ventricle has normal function. The left ventricle has no regional wall motion abnormalities. The left ventricular internal cavity size was normal in size. There is  mild concentric left ventricular hypertrophy. The interventricular septum is flattened in systole, consistent with right ventricular pressure overload. Left ventricular diastolic parameters were normal. Right Ventricle: McConnell's sign present (RV dysfunction with apical sparing) suggestive of acute PE. The right ventricular size is moderately enlarged. No increase in right ventricular wall thickness. Right ventricular systolic function is moderately reduced. There is mildly elevated pulmonary artery systolic pressure. The tricuspid regurgitant velocity is 3.12 m/s, and with an assumed right atrial pressure of 3 mmHg, the estimated right ventricular systolic pressure is 41.9 mmHg. Left Atrium: Left atrial size was normal in size. Right Atrium: Right atrial size was moderately dilated. Pericardium: Trivial pericardial effusion is present. The pericardial effusion is posterior to the left ventricle. Mitral Valve: The mitral valve is normal in structure. No evidence of mitral valve regurgitation. No evidence of mitral valve stenosis. Tricuspid Valve: The tricuspid valve is normal in structure. Tricuspid valve regurgitation is mild . No evidence of tricuspid stenosis. Aortic Valve: The aortic valve is bicuspid. There is mild calcification of the aortic valve. Aortic valve regurgitation is not visualized. Aortic valve sclerosis/calcification is present, without any evidence of aortic stenosis. Aortic valve mean gradient measures 5.0 mmHg. Aortic valve peak gradient measures  10.2 mmHg. Aortic valve area, by VTI measures 1.52 cm. Pulmonic Valve: The pulmonic valve was normal in structure. Pulmonic valve regurgitation is trivial. No evidence of pulmonic stenosis. Aorta: The aortic root is normal in size and structure. Venous: The inferior vena cava is normal in size with greater than 50% respiratory variability, suggesting right atrial pressure of 3 mmHg. IAS/Shunts: No atrial level shunt detected by color flow Doppler.  LEFT VENTRICLE PLAX 2D LVIDd:         3.90 cm   Diastology LVIDs:         2.40 cm   LV e' medial:    7.72 cm/s LV PW:         1.10 cm   LV E/e' medial:  13.5 LV IVS:        1.00 cm   LV e' lateral:   8.16 cm/s LVOT diam:     1.80 cm   LV E/e' lateral: 12.7 LV SV:         42 LV SV Index:   23 LVOT Area:     2.54 cm  RIGHT VENTRICLE RV S prime:     15.90 cm/s TAPSE (M-mode): 2.7 cm LEFT ATRIUM             Index  RIGHT ATRIUM           Index LA diam:        3.30 cm 1.80 cm/m   RA Area:     16.20 cm LA Vol (A2C):   41.6 ml 22.68 ml/m  RA Volume:   44.20 ml  24.09 ml/m LA Vol (A4C):   30.5 ml 16.63 ml/m LA Biplane Vol: 38.2 ml 20.82 ml/m  AORTIC VALVE AV Area (Vmax):    1.46 cm AV Area (Vmean):   1.46 cm AV Area (VTI):     1.52 cm AV Vmax:           160.00 cm/s AV Vmean:          106.000 cm/s AV VTI:            0.276 m AV Peak Grad:      10.2 mmHg AV Mean Grad:      5.0 mmHg LVOT Vmax:         92.10 cm/s LVOT Vmean:        60.800 cm/s LVOT VTI:          0.165 m LVOT/AV VTI ratio: 0.60  AORTA Ao Root diam: 2.60 cm Ao Asc diam:  3.10 cm MITRAL VALVE                TRICUSPID VALVE MV Area (PHT): 4.41 cm     TR Peak grad:   38.9 mmHg MV Decel Time: 172 msec     TR Vmax:        312.00 cm/s MV E velocity: 104.00 cm/s MV A velocity: 106.00 cm/s  SHUNTS MV E/A ratio:  0.98         Systemic VTI:  0.16 m                             Systemic Diam: 1.80 cm Toribio Fuel MD Electronically signed by Toribio Fuel MD Signature Date/Time: 06/01/2024/6:43:11 AM    Final     VAS US  LOWER EXTREMITY VENOUS (DVT) Result Date: 05/31/2024  Lower Venous DVT Study Patient Name:  [name]  Date of Exam:   05/31/2024 Medical Rec #: [id number]     Accession #:    [id number] Date of Birth: [birth year]     Patient Gender: F Patient Age:   46 years Exam Location:  [facility] Procedure:      VAS US  LOWER EXTREMITY VENOUS (DVT) Referring Phys: PROSPER AMPONSAH --------------------------------------------------------------------------------  Indications: Pulmonary embolism.  Risk Factors: Breast cancer, on chemotherapy. Limitations: Poor ultrasound/tissue interface and calcific shadowing. Comparison Study: No previous exams Performing Technologist: Jody Hill RVT, RDMS  Examination Guidelines: A complete evaluation includes B-mode imaging, spectral Doppler, color Doppler, and power Doppler as needed of all accessible portions of each vessel. Bilateral testing is considered an integral part of a complete examination. Limited examinations for reoccurring indications may be performed as noted. The reflux portion of the exam is performed with the patient in reverse Trendelenburg.  +---------+---------------+---------+-----------+----------+-------------------+ RIGHT    CompressibilityPhasicitySpontaneityPropertiesThrombus Aging      +---------+---------------+---------+-----------+----------+-------------------+ CFV      Full           No       Yes                                      +---------+---------------+---------+-----------+----------+-------------------+ SFJ  Full                                                             +---------+---------------+---------+-----------+----------+-------------------+ FV Prox  Full           No       Yes                                      +---------+---------------+---------+-----------+----------+-------------------+ FV Mid   Full           No       Yes                                       +---------+---------------+---------+-----------+----------+-------------------+ FV DistalFull           No       Yes                                      +---------+---------------+---------+-----------+----------+-------------------+ PFV      Full                                         Not well visualized +---------+---------------+---------+-----------+----------+-------------------+ POP      Full           No       Yes                                      +---------+---------------+---------+-----------+----------+-------------------+ PTV      Partial        No       No                   Age Indeterminate   +---------+---------------+---------+-----------+----------+-------------------+ PERO     Full                                                             +---------+---------------+---------+-----------+----------+-------------------+ Pulsatile doppler waveforms seen in lower extemity  +---------+---------------+---------+-----------+----------+-------------------+ LEFT     CompressibilityPhasicitySpontaneityPropertiesThrombus Aging      +---------+---------------+---------+-----------+----------+-------------------+ CFV      Full           No       Yes                                      +---------+---------------+---------+-----------+----------+-------------------+ SFJ      Full                                                             +---------+---------------+---------+-----------+----------+-------------------+  FV Prox  Full           Yes      Yes                                      +---------+---------------+---------+-----------+----------+-------------------+ FV Mid   Full           Yes      Yes                                      +---------+---------------+---------+-----------+----------+-------------------+ FV DistalFull           Yes      Yes                                       +---------+---------------+---------+-----------+----------+-------------------+ PFV      Full                                                             +---------+---------------+---------+-----------+----------+-------------------+ POP      Full           Yes      Yes                                      +---------+---------------+---------+-----------+----------+-------------------+ PTV      Partial        No       No                   age indeterminate -                                                       one of paired       +---------+---------------+---------+-----------+----------+-------------------+ PERO     None           No       No                   Age Indeterminate   +---------+---------------+---------+-----------+----------+-------------------+     Summary: RIGHT: - Findings consistent with age indeterminate deep vein thrombosis involving the right posterior tibial veins.   LEFT: - Findings consistent with age indeterminate deep vein thrombosis involving the left posterior tibial veins, and left peroneal veins.   *See table(s) above for measurements and observations. Electronically signed by Norman Serve on 05/31/2024 at 2:58:28 PM.    Final    CT Angio Chest PE W and/or Wo Contrast Result Date: 05/30/2024 CLINICAL DATA:  Tachycardia and chest pain EXAM: CT ANGIOGRAPHY CHEST WITH CONTRAST TECHNIQUE: Multidetector CT imaging of the chest was performed using the standard protocol during bolus administration of intravenous contrast. Multiplanar CT image reconstructions and MIPs were obtained to evaluate the vascular anatomy. RADIATION DOSE REDUCTION: This exam was performed according to the departmental dose-optimization program which  includes automated exposure control, adjustment of the mA and/or kV according to patient size and/or use of iterative reconstruction technique. CONTRAST:  75mL OMNIPAQUE  IOHEXOL  350 MG/ML SOLN COMPARISON:  Chest x-ray from earlier  in the same day FINDINGS: Cardiovascular: Atherosclerotic calcifications of the thoracic aorta are noted. No aneurysmal dilatation or dissection is seen. The heart is mildly enlarged. Coronary calcifications are noted. The pulmonary artery shows a normal branching pattern bilaterally. Scattered filling defects are noted bilaterally in segmental and subsegmental pulmonary arterial branches. RV/LV ratio of 1.4 is noted consistent with right heart strain. Mediastinum/Nodes: Thoracic inlet is within normal limits. No hilar or mediastinal adenopathy is noted. The esophagus as visualized is within normal limits. Lungs/Pleura: Mosaic attenuation is noted throughout both lungs consistent with patchy air trapping. Mild atelectatic changes are noted in the left posterior costophrenic angle. No sizable parenchymal nodule is seen. Upper Abdomen: No acute abnormality. Musculoskeletal: Degenerative changes of the thoracic spine are noted. No acute rib abnormality is noted. Review of the MIP images confirms the above findings. IMPRESSION: Positive for acute PE with CT evidence of right heart strain (RV/LV Ratio = 1.4) consistent with at least submassive (intermediate risk) PE. The presence of right heart strain has been associated with an increased risk of morbidity and mortality. Please refer to the Code PE Focused order set in EPIC. No other focal abnormality is noted. Aortic Atherosclerosis (ICD10-I70.0). Critical Value/emergent results were called by telephone at the time of interpretation on 05/30/2024 at 7:13 pm to Dr. SID BONING , who verbally acknowledged these results. Electronically Signed   By: Oneil Devonshire M.D.   On: 05/30/2024 19:16   DG Chest Portable 1 View Result Date: 05/30/2024 CLINICAL DATA:  Shortness of breath. History of left breast carcinoma. EXAM: PORTABLE CHEST 1 VIEW COMPARISON:  PET-CT dated 03/20/2024. FINDINGS: The heart size is within normal limits for technique. Mediastinal contours are within  normal limits. Right chest Port-A-Cath tip overlies the lower SVC. Aortic atherosclerosis. No focal consolidation, pleural effusion, or pneumothorax. No acute osseous abnormality. IMPRESSION: 1. No acute cardiopulmonary findings. 2.  Aortic Atherosclerosis (ICD10-I70.0). Electronically Signed   By: Harrietta Sherry M.D.   On: 05/30/2024 15:58     Assessment & Plan:   1. Nocturnal hypoxemia - Pulse oximetry, overnight; Future  2. Acute pulmonary embolism with acute cor pulmonale, unspecified pulmonary embolism type (HCC) (Primary) - ECHOCARDIOGRAM COMPLETE; Future  3. Flu vaccine need - Flu vaccine HIGH DOSE PF(Fluzone Trivalent)   Assessment and Plan Assessment & Plan Pulmonary embolism with acute cor pulmonale and history of bilateral lower extremity deep vein thrombosis, on lifelong anticoagulation Hospitalized from August 12th to 16th for shortness of breath and elevated heart rate. CT scan revealed a pulmonary embolism with RV strain. Treated with a heparin  drip and discharged on Eliquis . The embolism was unprovoked, possibly related to cancer treatment. Lifelong anticoagulation is required to prevent recurrence. She is tolerating medication. No current symptoms of shortness of breath, chest pain, or hemoptysis. An echocardiogram is needed to reassess heart strain three months post-embolism. - Continue Eliquis  5mg  twice daily as prescribed - Order echocardiogram in November to reassess heart strain - Perform walking test to check oxygen levels  Nocturnal hypoxemia  During hospitalization, oxygen levels dropped to the 80s while sleeping, raising concerns for sleep-disordered breathing. No current symptoms of snoring, gasping, or excessive daytime sleepiness. An overnight oximetry test is planned to evaluate oxygen levels during sleep. If abnormal, a formal sleep study will be considered. She has  a sleep study scheduled for October, but will proceed with the oximetry test first. - Perform  overnight oximetry test at home - If oximetry test is abnormal, proceed with scheduled sleep study in October - If oximetry test is normal, cancel the sleep study  History of breast cancer, ongoing oncology follow-up - Continue follow-up with oncology, next appointment on September 25th  Barbara LELON Ferrari, NP 06/29/2024

## 2024-07-02 ENCOUNTER — Encounter: Payer: Self-pay | Admitting: Hematology

## 2024-07-05 ENCOUNTER — Other Ambulatory Visit: Payer: Self-pay

## 2024-07-05 DIAGNOSIS — M47817 Spondylosis without myelopathy or radiculopathy, lumbosacral region: Secondary | ICD-10-CM

## 2024-07-05 DIAGNOSIS — F988 Other specified behavioral and emotional disorders with onset usually occurring in childhood and adolescence: Secondary | ICD-10-CM

## 2024-07-05 DIAGNOSIS — M48062 Spinal stenosis, lumbar region with neurogenic claudication: Secondary | ICD-10-CM

## 2024-07-05 DIAGNOSIS — Q762 Congenital spondylolisthesis: Secondary | ICD-10-CM

## 2024-07-06 MED ORDER — HYDROCODONE-ACETAMINOPHEN 7.5-325 MG PO TABS: 1.0000 | ORAL_TABLET | ORAL | 0 refills | Status: DC

## 2024-07-06 MED ORDER — METHYLPHENIDATE HCL 10 MG PO TABS: 20.0000 mg | ORAL_TABLET | Freq: Three times a day (TID) | ORAL | 0 refills | Status: DC

## 2024-07-09 ENCOUNTER — Ambulatory Visit
Admission: RE | Admit: 2024-07-09 | Discharge: 2024-07-09 | Disposition: A | Discharge status: Home / Self Care | Source: Ambulatory Visit | Attending: Hematology

## 2024-07-09 DIAGNOSIS — Z170 Estrogen receptor positive status [ER+]: Secondary | ICD-10-CM

## 2024-07-09 MED ORDER — GADOPICLENOL 0.5 MMOL/ML IV SOLN
7.5000 mL | Freq: Once | INTRAVENOUS | Status: AC | PRN
  Administered 2024-07-09: 7.5 mL via INTRAVENOUS

## 2024-07-11 ENCOUNTER — Encounter: Payer: Self-pay | Admitting: *Deleted

## 2024-07-11 ENCOUNTER — Ambulatory Visit

## 2024-07-11 ENCOUNTER — Ambulatory Visit: Admitting: Hematology

## 2024-07-11 ENCOUNTER — Other Ambulatory Visit

## 2024-07-12 NOTE — Assessment & Plan Note (Signed)
-  multifocal (UIQ, central, and lateral of left breast), UIQ cT3cN1M0, ER+/PR+/HER2+, central and lateral mass are invasive mammary carcinoma, favor lobular, ER/PR strongly positive, HER2 negative by FISH -presented with screening discovered left breast cancer with nodal metastasis.  - Breast MRI showed 2.5 cm mass in the upper inner quadrant of left breast, additional suspicious clumped linear non-mass enhancement in the retroareolar left measuring 5 cm, and additional small area of non-mass enhancement in the lateral left breast spanning 1.1 cm, 1.3 centimeter abnormal lymph node in the left low axilla, no additional abnormal lymph nodes. -She underwent additional biopsy of the retroareolar and lateral breast mass, which both showed invasive mammary carcinoma, favor lobular, both ER/PR strongly positive, HER2 negative by FISH. -PET negative for distant mets  -she started neoadjuvant Enhertu  on 03/29/2024 - Patient is not interested in surgery or intensive chemotherapy, plan to change her treatment to AI and HER2 antibodies as a maintenance therapy at some point. - Patient was hospitalized on May 30, 2024 for submassive PE, required oxygen. She does not want to try Enhertu  again -breast MRI 07/09/2024 showed mixed response

## 2024-07-13 ENCOUNTER — Inpatient Hospital Stay (HOSPITAL_BASED_OUTPATIENT_CLINIC_OR_DEPARTMENT_OTHER): Admitting: Hematology

## 2024-07-13 ENCOUNTER — Encounter: Payer: Self-pay | Admitting: Hematology

## 2024-07-13 ENCOUNTER — Inpatient Hospital Stay: Attending: Nurse Practitioner

## 2024-07-13 VITALS — BP 138/64 | HR 81 | Temp 98.1°F | Resp 15 | Ht 62.0 in | Wt 180.4 lb

## 2024-07-13 DIAGNOSIS — M4800 Spinal stenosis, site unspecified: Secondary | ICD-10-CM | POA: Diagnosis not present

## 2024-07-13 DIAGNOSIS — I4891 Unspecified atrial fibrillation: Secondary | ICD-10-CM | POA: Diagnosis not present

## 2024-07-13 DIAGNOSIS — Z170 Estrogen receptor positive status [ER+]: Secondary | ICD-10-CM | POA: Insufficient documentation

## 2024-07-13 DIAGNOSIS — G8929 Other chronic pain: Secondary | ICD-10-CM | POA: Diagnosis not present

## 2024-07-13 DIAGNOSIS — Z1731 Human epidermal growth factor receptor 2 positive status: Secondary | ICD-10-CM | POA: Diagnosis not present

## 2024-07-13 DIAGNOSIS — Z79899 Other long term (current) drug therapy: Secondary | ICD-10-CM | POA: Insufficient documentation

## 2024-07-13 DIAGNOSIS — M549 Dorsalgia, unspecified: Secondary | ICD-10-CM | POA: Insufficient documentation

## 2024-07-13 DIAGNOSIS — C50212 Malignant neoplasm of upper-inner quadrant of left female breast: Secondary | ICD-10-CM | POA: Insufficient documentation

## 2024-07-13 DIAGNOSIS — Z7901 Long term (current) use of anticoagulants: Secondary | ICD-10-CM | POA: Diagnosis not present

## 2024-07-13 DIAGNOSIS — R197 Diarrhea, unspecified: Secondary | ICD-10-CM | POA: Diagnosis not present

## 2024-07-13 DIAGNOSIS — Z86711 Personal history of pulmonary embolism: Secondary | ICD-10-CM | POA: Insufficient documentation

## 2024-07-13 DIAGNOSIS — Z1721 Progesterone receptor positive status: Secondary | ICD-10-CM | POA: Insufficient documentation

## 2024-07-13 LAB — CBC WITH DIFFERENTIAL (CANCER CENTER ONLY)
Abs Immature Granulocytes: 0.02 K/uL (ref 0.00–0.07)
Basophils Absolute: 0.1 K/uL (ref 0.0–0.1)
Basophils Relative: 1 %
Eosinophils Absolute: 0.3 K/uL (ref 0.0–0.5)
Eosinophils Relative: 3 %
HCT: 32.8 % — ABNORMAL LOW (ref 36.0–46.0)
Hemoglobin: 10.7 g/dL — ABNORMAL LOW (ref 12.0–15.0)
Immature Granulocytes: 0 %
Lymphocytes Relative: 26 %
Lymphs Abs: 2 K/uL (ref 0.7–4.0)
MCH: 26.7 pg (ref 26.0–34.0)
MCHC: 32.6 g/dL (ref 30.0–36.0)
MCV: 81.8 fL (ref 80.0–100.0)
Monocytes Absolute: 0.6 K/uL (ref 0.1–1.0)
Monocytes Relative: 8 %
Neutro Abs: 4.8 K/uL (ref 1.7–7.7)
Neutrophils Relative %: 62 %
Platelet Count: 295 K/uL (ref 150–400)
RBC: 4.01 MIL/uL (ref 3.87–5.11)
RDW: 13.9 % (ref 11.5–15.5)
WBC Count: 7.8 K/uL (ref 4.0–10.5)
nRBC: 0 % (ref 0.0–0.2)

## 2024-07-13 LAB — CMP (CANCER CENTER ONLY)
ALT: 9 U/L (ref 0–44)
AST: 13 U/L — ABNORMAL LOW (ref 15–41)
Albumin: 3.9 g/dL (ref 3.5–5.0)
Alkaline Phosphatase: 52 U/L (ref 38–126)
Anion gap: 6 (ref 5–15)
BUN: 11 mg/dL (ref 8–23)
CO2: 28 mmol/L (ref 22–32)
Calcium: 9.5 mg/dL (ref 8.9–10.3)
Chloride: 103 mmol/L (ref 98–111)
Creatinine: 0.67 mg/dL (ref 0.44–1.00)
GFR, Estimated: >60 mL/min (ref >60)
Glucose, Bld: 112 mg/dL — ABNORMAL HIGH (ref 70–99)
Potassium: 3.9 mmol/L (ref 3.5–5.1)
Sodium: 137 mmol/L (ref 135–145)
Total Bilirubin: 0.3 mg/dL (ref 0.0–1.2)
Total Protein: 7.2 g/dL (ref 6.5–8.1)

## 2024-07-13 MED ORDER — EXEMESTANE 25 MG PO TABS: 25.0000 mg | ORAL_TABLET | Freq: Every day | ORAL | 3 refills | Status: DC

## 2024-07-13 NOTE — Patient Instructions (Signed)

## 2024-07-13 NOTE — Progress Notes (Signed)
 [facility]   [phone] [phone]   Clinic Follow up Note   Patient Care Team: McDiarmid, Krystal BIRCH, MD as PCP - General [name], MD as Consulting Physician (Ophthalmology) Mavis Purchase, MD as Consulting Physician (Neurosurgery) Jaye Fallow, MD as Consulting Physician (Ophthalmology) Charlsie Josette SAILOR, RN as [facility] Management Tyree Nanetta SAILOR, RN as Oncology Nurse Navigator Lanny Callander, MD as Consulting Physician (Hematology)  Date of Service:  07/13/2024  CHIEF COMPLAINT: f/u of left breast cancer  CURRENT THERAPY:  Pending anastrozole  Oncology History   Malignant neoplasm of upper-inner quadrant of left breast in female, estrogen receptor positive (HCC) -multifocal (UIQ, central, and lateral of left breast), UIQ cT3cN1M0, ER+/PR+/HER2+, central and lateral mass are invasive mammary carcinoma, favor lobular, ER/PR strongly positive, HER2 negative by FISH -presented with screening discovered left breast cancer with nodal metastasis.  - Breast MRI showed 2.5 cm mass in the upper inner quadrant of left breast, additional suspicious clumped linear non-mass enhancement in the retroareolar left measuring 5 cm, and additional small area of non-mass enhancement in the lateral left breast spanning 1.1 cm, 1.3 centimeter abnormal lymph node in the left low axilla, no additional abnormal lymph nodes. -She underwent additional biopsy of the retroareolar and lateral breast mass, which both showed invasive mammary carcinoma, favor lobular, both ER/PR strongly positive, HER2 negative by FISH. -PET negative for distant mets  -she started neoadjuvant Enhertu  on 03/29/2024 - Patient is not interested in surgery or intensive chemotherapy, plan to change her treatment to AI and HER2 antibodies as a maintenance therapy at some point. - Patient was hospitalized on May 30, 2024 for submassive PE, required oxygen. She does not want to try Enhertu   again -breast MRI 07/09/2024 showed mixed response   Assessment & Plan ER/PR+ HER2+ left breast cancer Mixed response to previous therapy with stable disease overall. Recent imaging shows improvement in some lesions, worsening of a lesion behind the nipple, and stable lymph nodes. Treatment gap since May 09, 2024, may have contributed to progression. Hesitant to resume HER2-targeted therapy due to previous pulmonary side effects. - Initiate anti-estrogen therapy with exemestane , an oral medication taken once daily. - Consider trastuzumab  (Herceptin ) as a second step if she is willing, pending tolerance to anti-estrogen therapy. - Provide educational material on exemestane . - Arrange for [facility] home delivery for exemestane  prescription.  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 - Refill prescription for Lomotil  (diphenoxylate /atropine ) for diarrhea management.  Atrial fibrillation On chronic anticoagulation therapy with Eliquis . - Continue Eliquis  indefinitely.  Spinal stenosis with chronic back pain Chronic back pain managed with gabapentin . Discontinued hydrochlorothiazide  due to previous difficulties.  Plan - I recommend anastrozole, prescription called in today, she will start in the next week. - Follow-up in 9 months with repeated a CT chest without contrast - Will discuss adding trastuzumab  on next visit.   SUMMARY OF ONCOLOGIC HISTORY: Oncology History  Malignant neoplasm of upper-inner quadrant of left breast in female, estrogen receptor positive (HCC)  02/25/2024 Cancer Staging   Staging form: Breast, AJCC 8th Edition - Clinical stage from 02/25/2024: Stage IB (cT1c, cN1, cM0, G3, ER+, PR+, HER2+) - Signed by Lanny Callander, MD on 03/07/2024 Stage prefix: Initial diagnosis Histologic grading system: 3 grade system   03/07/2024 Initial Diagnosis   Malignant neoplasm of upper-inner quadrant of left  breast in female, estrogen receptor positive (HCC)   03/29/2024 - 05/09/2024 Chemotherapy   Patient  is on Treatment Plan : BREAST Fam-Trastuzumab Deruxtecan-nxki  (Enhertu ) (5.4) q21d        Discussed the use of AI scribe software for clinical note transcription with the patient, who gave verbal consent to proceed.  History of Present Illness [name] is a 73 year old female with breast cancer who presents for follow-up.  She is undergoing treatment for breast cancer with a recent MI scan showing a mixed response. Some lesions have improved, one lesion behind the nipple has worsened, and the lymph nodes are stable. She completed three treatments, with the last one on July 22nd. She is hesitant to resume previous treatment due to potential lung issues related to HER2 therapy. The lesions in the left breast are ER, PR positive.  Diarrhea is controlled with medication taken twice daily. She reports no pain or discomfort aside from the diarrhea. Her eating has improved, and she reports no new issues since the last visit.     All other systems were reviewed with the patient and are negative.  MEDICAL HISTORY:  Past Medical History:  Diagnosis Date   Abnormal mammogram of both breasts 02/14/2024   See 02/09/24 mammogram report     Acute MEE (middle ear effusion) 06/07/2014   Acute recurrent sinusitis 12/21/2014   Acute sinusitis 12/21/2014   Allergy    Anemia    hx   Asthma, intermittent 11/02/2018   ATTENTION DEFICIT, W/O HYPERACTIVITY 12/16/2006   Bilateral carpal tunnel syndrome 11/20/2016   Cataract    Cervical spondylosis with myelopathy, History of 04/28/2012   COLON POLYP 12/16/2006   (02/03/12) Surgical [Pathology], sigmoid colon, polyp HYPERPLASTIC POLYP(S) AND POLYPOID FRAGMETNS OF BENIGN COLONIC MUCOSA WITH INTRAMUCOSAL LYMPHOID AGGREGATES. NO ADENOMATOUS CHANGE OR MALIGNANCY IDENTIFIED.    Demand ischemia (HCC) 05/30/2024   Associated with acute pulmonary embolism with  right heart strain   DIABETES MELLITUS II, UNCOMPLICATED 12/16/2006   Fall 10/18/2020   GERD (gastroesophageal reflux disease)    H/O abnormal mammogram 07/19/2009   S/P Breast Biopsy [facility], [place], 07/2009): Benign findings.     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/20/2016   OSTEOARTHRITIS, MULTI SITES 12/16/2006   Perennial allergic rhinitis with seasonal variation 12/16/2006        PONV (postoperative nausea and vomiting)    last surgery only   Recurrent maxillary sinusitis    SACROILIITIS, HISTORY OF 01/08/2009   Qualifier: History of  By: McDiarmid MD, Todd     Seasonal allergies    Seborrheic keratosis 01/15/2016   SKELETAL HYPEROSTOSIS 03/29/2008   Ulcer    Uterine fibroid 02/02/2011   TVUS: 4cm fibroid w/ submucosal component, bil.hydrosalpinges, unable measurable endometrium - 08/18/2004    Vulvovaginitis due to Candida 10/02/2022    SURGICAL HISTORY: Past Surgical History:  Procedure Laterality Date   ANTERIOR CERVICAL DECOMP/DISCECTOMY FUSION  04/28/2012  Procedure: ANTERIOR CERVICAL DECOMPRESSION/DISCECTOMY FUSION 3 LEVELS;  Surgeon: Reyes JONETTA Budge, MD;  Location: [facility];  Service: Neurosurgery;  Laterality: N/A;  Cervical three-four,Cervical four-five,Cervical five-six anterior cervical decompression with fusion interbody prothesis  plating and bonegraft   BREAST BIOPSY  07/19/2009   S/P Breast Biopsy [facility], [place], 07/2009): Benign findings.  (10/27/2010)   BREAST BIOPSY Left 02/25/2024   US  LT BREAST BX W LOC DEV 1ST LESION IMG BX SPEC US  GUIDE 02/25/2024 [facility]   BREAST BIOPSY Left 02/25/2024   US  LT BREAST BX W LOC DEV EA ADD LESION IMG BX SPEC US  GUIDE 02/25/2024 [facility]   BREAST BIOPSY Right 03/02/2024   US  RT BREAST BX W LOC DEV 1ST LESION IMG BX SPEC US  GUIDE 03/02/2024 [facility]   BREAST LUMPECTOMY Left    CARPAL TUNNEL RELEASE Left 12/10/2016   Procedure: LEFT CARPAL TUNNEL RELEASE;  Surgeon: Murrell Kuba, MD;  Location: [facility];  Service: Orthopedics;  Laterality: Left;   COLONOSCOPY W/ POLYPECTOMY  12/17/2000   colonoscopy (Dr Kristie) int. hemorrhoids &  nonneoplatic colon polyps - 01/10/2001,    COLONOSCOPY W/ POLYPECTOMY  01/18/2012   Dr Albertus (GI).sigmoid colon, hyperlastic polyp   ENDOMETRIAL BIOPSY  08/19/2004   Endometrial Biopsy - 09/01/2004,   IR IMAGING GUIDED PORT INSERTION  03/28/2024   NM MYOVIEW  LTD  04/18/2004   Cardiolite:EF63%, no ischemia - 05/01/2004,    REPLACEMENT TOTAL KNEE BILATERAL      I have reviewed the social history and family history with the patient and they are unchanged from previous note.  ALLERGIES:  is allergic to januvia  [sitagliptin ], jardiance  [empagliflozin ], celecoxib, and diclofenac-misoprostol.  MEDICATIONS:  Current Outpatient Medications  Medication Sig Dispense Refill   exemestane  (AROMASIN ) 25 MG tablet Take 1 tablet (25 mg total) by mouth daily after breakfast. 30 tablet 3   amLODipine -benazepril  (LOTREL) 10-20 MG capsule TAKE 1 CAPSULE BY MOUTH DAILY 90 capsule 3   apixaban  (ELIQUIS ) 5 MG TABS tablet Take 1 tablet (5 mg total) by mouth 2 (two) times daily. 180 tablet 2   atorvastatin  (LIPITOR) 40 MG tablet Take 1 tablet (40 mg total) by mouth daily. (Patient not taking: Reported on 06/29/2024) 90  tablet 3   blood glucose meter kit and supplies KIT Dispense based on patient and insurance preference. Use up to four times daily as directed. (Patient not taking: Reported on 06/29/2024) 1 each 0   Blood Glucose Monitoring Suppl DEVI 1 each by Does not apply route in the morning, at noon, and at bedtime. May substitute to any manufacturer covered by patient's insurance. (Patient not taking: Reported on 06/29/2024) 1 each 0   carvedilol  (COREG ) 6.25 MG tablet TAKE 1 TABLET(6.25 MG) BY MOUTH TWICE DAILY WITH A MEAL 180 tablet 3   diphenoxylate -atropine  (LOMOTIL ) 2.5-0.025 MG tablet Take 1-2 tablets by mouth 4 (four) times daily as needed for diarrhea or loose stools. 90 tablet 1   fexofenadine  (ALLEGRA ) 180 MG tablet Take 180 mg by mouth daily as needed for allergies or rhinitis.     gabapentin  (NEURONTIN ) 100 MG capsule TAKE 1 CAPSULE(100 MG) BY MOUTH THREE TIMES DAILY AS NEEDED 270 capsule 3   hydrochlorothiazide  (HYDRODIURIL ) 25 MG tablet TAKE 1 TABLET(25 MG) BY MOUTH DAILY (Patient not taking: Reported on 06/29/2024) 90 tablet 3   HYDROcodone -acetaminophen  (NORCO) 7.5-325 MG tablet Take 1 tablet by mouth See admin instructions. Take 1 tablet by mouth two to three times a day 90 tablet  0   lidocaine -prilocaine  (EMLA ) cream Apply 1 Application topically daily as needed. Use as directed for port access 30 g 3   metFORMIN  (GLUCOPHAGE -XR) 500 MG 24 hr tablet TAKE 1 TABLET(500 MG) BY MOUTH TWICE DAILY WITH A MEAL 180 tablet 3   methylphenidate  (RITALIN ) 10 MG tablet Take 2 tablets (20 mg total) by mouth 3 (three) times daily with meals. 180 tablet 0   Prenat-Fe Carbonyl-FA-Omega 3 (ONE-A-DAY WOMENS PRENATAL 1) 28-0.8-235 MG CAPS Take 1 capsule by mouth daily with breakfast.     vitamin B-12 (CYANOCOBALAMIN) 1000 MCG tablet Take 1 tablet (1,000 mcg total) by mouth daily. 180 tablet 3   No current facility-administered medications for this visit.    PHYSICAL EXAMINATION: ECOG PERFORMANCE STATUS: 1 -  Symptomatic but completely ambulatory  Vitals:   07/13/24 1334  BP: 138/64  Pulse: 81  Resp: 15  Temp: 98.1 F (36.7 C)  SpO2: 99%   Wt Readings from Last 3 Encounters:  07/13/24 180 lb 6.4 oz (81.8 kg)  06/29/24 179 lb (81.2 kg)  06/12/24 180 lb 12.8 oz (82 kg)     GENERAL:alert, no distress and comfortable SKIN: skin color, texture, turgor are normal, no rashes or significant lesions EYES: normal, Conjunctiva are pink and non-injected, sclera clear NECK: supple, thyroid  normal size, non-tender, without nodularity LYMPH:  no palpable lymphadenopathy in the cervical, axillary  LUNGS: clear to auscultation and percussion with normal breathing effort HEART: regular rate & rhythm and no murmurs and no lower extremity edema ABDOMEN:abdomen soft, non-tender and normal bowel sounds Musculoskeletal:no cyanosis of digits and no clubbing  NEURO: alert & oriented x 3 with fluent speech, no focal motor/sensory deficits  Physical Exam    LABORATORY DATA:  I have reviewed the data as listed    Latest Ref Rng & Units 07/13/2024    1:18 PM 06/08/2024   11:11 AM 06/03/2024    8:13 AM  CBC  WBC 4.0 - 10.5 K/uL 7.8  13.4  10.3   Hemoglobin 12.0 - 15.0 g/dL 89.2  9.7  8.4   Hematocrit 36.0 - 46.0 % 32.8  31.6  26.5   Platelets 150 - 400 K/uL 295  562  410         Latest Ref Rng & Units 07/13/2024    1:18 PM 06/08/2024   11:11 AM 06/02/2024    4:32 AM  CMP  Glucose 70 - 99 mg/dL 887  887  870   BUN 8 - 23 mg/dL 11  7  9    Creatinine 0.44 - 1.00 mg/dL 9.32  9.13  9.30   Sodium 135 - 145 mmol/L 137  140  136   Potassium 3.5 - 5.1 mmol/L 3.9  4.4  3.7   Chloride 98 - 111 mmol/L 103  100  104   CO2 22 - 32 mmol/L 28  22  23    Calcium  8.9 - 10.3 mg/dL 9.5  9.3  8.3   Total Protein 6.5 - 8.1 g/dL 7.2     Total Bilirubin 0.0 - 1.2 mg/dL 0.3     Alkaline Phos 38 - 126 U/L 52     AST 15 - 41 U/L 13     ALT 0 - 44 U/L 9         RADIOGRAPHIC STUDIES: I have personally reviewed the  radiological images as listed and agreed with the findings in the report. No results found.    Orders Placed This Encounter  Procedures   CT Chest  Wo Contrast    Standing Status:   Future    Expected Date:   08/09/2024    Expiration Date:   07/13/2025    Preferred imaging location?:   [facility]   All questions were answered. The patient knows to call the clinic with any problems, questions or concerns. No barriers to learning was detected. The total time spent in the appointment was 30 minutes, including review of chart and various tests results, discussions about plan of care and coordination of care plan     Onita Mattock, MD 07/13/2024

## 2024-07-14 ENCOUNTER — Other Ambulatory Visit: Payer: Self-pay

## 2024-07-14 ENCOUNTER — Encounter: Payer: Self-pay | Admitting: *Deleted

## 2024-07-24 ENCOUNTER — Other Ambulatory Visit: Payer: Self-pay | Admitting: Family Medicine

## 2024-07-24 DIAGNOSIS — E1149 Type 2 diabetes mellitus with other diabetic neurological complication: Secondary | ICD-10-CM

## 2024-08-02 ENCOUNTER — Encounter: Payer: Self-pay | Admitting: Hematology

## 2024-08-03 DIAGNOSIS — G4730 Sleep apnea, unspecified: Secondary | ICD-10-CM | POA: Diagnosis not present

## 2024-08-03 DIAGNOSIS — R0902 Hypoxemia: Secondary | ICD-10-CM | POA: Diagnosis not present

## 2024-08-09 ENCOUNTER — Ambulatory Visit (HOSPITAL_COMMUNITY)
Admission: RE | Admit: 2024-08-09 | Discharge: 2024-08-09 | Disposition: A | Discharge status: Home / Self Care | Source: Ambulatory Visit | Attending: Hematology | Admitting: Hematology

## 2024-08-09 DIAGNOSIS — C50212 Malignant neoplasm of upper-inner quadrant of left female breast: Secondary | ICD-10-CM | POA: Diagnosis not present

## 2024-08-09 DIAGNOSIS — Z170 Estrogen receptor positive status [ER+]: Secondary | ICD-10-CM | POA: Insufficient documentation

## 2024-08-09 DIAGNOSIS — R911 Solitary pulmonary nodule: Secondary | ICD-10-CM | POA: Diagnosis not present

## 2024-08-10 ENCOUNTER — Encounter (HOSPITAL_BASED_OUTPATIENT_CLINIC_OR_DEPARTMENT_OTHER): Admitting: Internal Medicine

## 2024-08-16 NOTE — Assessment & Plan Note (Signed)
-  multifocal (UIQ, central, and lateral of left breast), UIQ cT3cN1M0, ER+/PR+/HER2+, central and lateral mass are invasive mammary carcinoma, favor lobular, ER/PR strongly positive, HER2 negative by FISH -presented with screening discovered left breast cancer with nodal metastasis.  - Breast MRI showed 2.5 cm mass in the upper inner quadrant of left breast, additional suspicious clumped linear non-mass enhancement in the retroareolar left measuring 5 cm, and additional small area of non-mass enhancement in the lateral left breast spanning 1.1 cm, 1.3 centimeter abnormal lymph node in the left low axilla, no additional abnormal lymph nodes. -She underwent additional biopsy of the retroareolar and lateral breast mass, which both showed invasive mammary carcinoma, favor lobular, both ER/PR strongly positive, HER2 negative by FISH. -PET negative for distant mets  -she started neoadjuvant Enhertu  on 03/29/2024 - Patient is not interested in surgery or intensive chemotherapy, plan to change her treatment to AI and HER2 antibodies as a maintenance therapy at some point. - Patient was hospitalized on May 30, 2024 for submassive PE, required oxygen. She does not want to try Enhertu  again -breast MRI 07/09/2024 showed mixed response  -she started anastrozole in late Sep 2025

## 2024-08-17 ENCOUNTER — Inpatient Hospital Stay: Admitting: Hematology

## 2024-08-17 ENCOUNTER — Inpatient Hospital Stay: Attending: Nurse Practitioner

## 2024-08-17 VITALS — BP 138/60 | HR 99 | Temp 98.3°F | Resp 18 | Ht 62.0 in | Wt 178.1 lb

## 2024-08-17 DIAGNOSIS — Z86711 Personal history of pulmonary embolism: Secondary | ICD-10-CM | POA: Insufficient documentation

## 2024-08-17 DIAGNOSIS — Z1731 Human epidermal growth factor receptor 2 positive status: Secondary | ICD-10-CM | POA: Insufficient documentation

## 2024-08-17 DIAGNOSIS — Z170 Estrogen receptor positive status [ER+]: Secondary | ICD-10-CM | POA: Diagnosis not present

## 2024-08-17 DIAGNOSIS — Z79811 Long term (current) use of aromatase inhibitors: Secondary | ICD-10-CM | POA: Insufficient documentation

## 2024-08-17 DIAGNOSIS — C50212 Malignant neoplasm of upper-inner quadrant of left female breast: Secondary | ICD-10-CM | POA: Diagnosis not present

## 2024-08-17 DIAGNOSIS — G629 Polyneuropathy, unspecified: Secondary | ICD-10-CM | POA: Diagnosis not present

## 2024-08-17 DIAGNOSIS — Z1721 Progesterone receptor positive status: Secondary | ICD-10-CM | POA: Insufficient documentation

## 2024-08-17 LAB — CBC WITH DIFFERENTIAL (CANCER CENTER ONLY)
Abs Immature Granulocytes: 0.01 K/uL (ref 0.00–0.07)
Basophils Absolute: 0.1 K/uL (ref 0.0–0.1)
Basophils Relative: 1 %
Eosinophils Absolute: 0.2 K/uL (ref 0.0–0.5)
Eosinophils Relative: 3 %
HCT: 35.1 % — ABNORMAL LOW (ref 36.0–46.0)
Hemoglobin: 11.3 g/dL — ABNORMAL LOW (ref 12.0–15.0)
Immature Granulocytes: 0 %
Lymphocytes Relative: 34 %
Lymphs Abs: 2.2 K/uL (ref 0.7–4.0)
MCH: 25.6 pg — ABNORMAL LOW (ref 26.0–34.0)
MCHC: 32.2 g/dL (ref 30.0–36.0)
MCV: 79.4 fL — ABNORMAL LOW (ref 80.0–100.0)
Monocytes Absolute: 0.4 K/uL (ref 0.1–1.0)
Monocytes Relative: 7 %
Neutro Abs: 3.6 K/uL (ref 1.7–7.7)
Neutrophils Relative %: 55 %
Platelet Count: 294 K/uL (ref 150–400)
RBC: 4.42 MIL/uL (ref 3.87–5.11)
RDW: 13.2 % (ref 11.5–15.5)
WBC Count: 6.5 K/uL (ref 4.0–10.5)
nRBC: 0 % (ref 0.0–0.2)

## 2024-08-17 LAB — CMP (CANCER CENTER ONLY)
ALT: 10 U/L (ref 0–44)
AST: 13 U/L — ABNORMAL LOW (ref 15–41)
Albumin: 4.1 g/dL (ref 3.5–5.0)
Alkaline Phosphatase: 60 U/L (ref 38–126)
Anion gap: 8 (ref 5–15)
BUN: 11 mg/dL (ref 8–23)
CO2: 26 mmol/L (ref 22–32)
Calcium: 9.7 mg/dL (ref 8.9–10.3)
Chloride: 104 mmol/L (ref 98–111)
Creatinine: 0.79 mg/dL (ref 0.44–1.00)
GFR, Estimated: >60 mL/min (ref >60)
Glucose, Bld: 104 mg/dL — ABNORMAL HIGH (ref 70–99)
Potassium: 4.2 mmol/L (ref 3.5–5.1)
Sodium: 138 mmol/L (ref 135–145)
Total Bilirubin: 0.3 mg/dL (ref 0.0–1.2)
Total Protein: 7.5 g/dL (ref 6.5–8.1)

## 2024-08-17 NOTE — Progress Notes (Signed)
 [facility]   [phone] [phone]   Clinic Follow up Note   Patient Care Team: McDiarmid, Krystal BIRCH, MD as PCP - General [name], MD as Consulting Physician (Ophthalmology) Mavis Purchase, MD as Consulting Physician (Neurosurgery) Jaye Fallow, MD as Consulting Physician (Ophthalmology) Charlsie Josette SAILOR, RN as [facility] Management Tyree Nanetta SAILOR, RN as Oncology Nurse Navigator Lanny Callander, MD as Consulting Physician (Hematology)  Date of Service:  08/17/2024  CHIEF COMPLAINT: f/u of breast cancer   CURRENT THERAPY:  Anastrozole   Oncology History   Malignant neoplasm of upper-inner quadrant of left breast in female, estrogen receptor positive (HCC) -multifocal (UIQ, central, and lateral of left breast), UIQ cT3cN1M0, ER+/PR+/HER2+, central and lateral mass are invasive mammary carcinoma, favor lobular, ER/PR strongly positive, HER2 negative by FISH -presented with screening discovered left breast cancer with nodal metastasis.  - Breast MRI showed 2.5 cm mass in the upper inner quadrant of left breast, additional suspicious clumped linear non-mass enhancement in the retroareolar left measuring 5 cm, and additional small area of non-mass enhancement in the lateral left breast spanning 1.1 cm, 1.3 centimeter abnormal lymph node in the left low axilla, no additional abnormal lymph nodes. -She underwent additional biopsy of the retroareolar and lateral breast mass, which both showed invasive mammary carcinoma, favor lobular, both ER/PR strongly positive, HER2 negative by FISH. -PET negative for distant mets  -she started neoadjuvant Enhertu  on 03/29/2024 - Patient is not interested in surgery or intensive chemotherapy, plan to change her treatment to AI and HER2 antibodies as a maintenance therapy at some point. - Patient was hospitalized on May 30, 2024 for submassive PE, required oxygen. She does not want to try Enhertu  again -breast  MRI 07/09/2024 showed mixed response  -she started anastrozole in late Sep 2025  Assessment & Plan Malignant neoplasm of upper-inner quadrant of left breast Breast cancer with partial response to previous treatment. Concerns about exemestane  due to potential joint pain and blood clots. Exemestane  has a 30-40% risk of joint pain, but it is lower compared to other medications. Tamoxifen is not preferred due to increased risk of blood clots. She prefers to avoid surgery and is willing to try exemestane  at a half dose initially. CT scan shows no pneumonitis and small lung nodules likely benign. Blood counts are improving, and appetite is better. Discussed potential addition of HER2 antibody therapy if oral medication is tolerated. - Start exemestane  at half dose for one month, then increase to full dose if tolerated. - Monitor for side effects and contact provider if issues arise. - Will consider HER2 antibody therapy if oral medication is tolerated. - Will schedule follow-up MRI in six months. - Scheduled follow-up appointment in two months.  Peripheral neuropathy Chronic peripheral neuropathy present before chemotherapy, with no significant worsening reported. - Continue to monitor symptoms and manage conservatively.   Plan - She is tolerating anastrozole well, will continue -Lab and port flush, follow-up in 2 months, will discuss adding HER2 antibody on next visit  SUMMARY OF ONCOLOGIC HISTORY: Oncology History  Malignant neoplasm of upper-inner quadrant of left breast in female, estrogen receptor positive (HCC)  02/25/2024 Cancer Staging   Staging form: Breast, AJCC 8th Edition - Clinical stage from 02/25/2024: Stage IB (cT1c, cN1, cM0, G3, ER+, PR+, HER2+) - Signed by Lanny Callander, MD on 03/07/2024 Stage prefix: Initial diagnosis Histologic grading system: 3 grade system   03/07/2024 Initial Diagnosis   Malignant neoplasm of upper-inner quadrant of left breast in  female, estrogen receptor  positive (HCC)   03/29/2024 - 05/09/2024 Chemotherapy   Patient is on Treatment Plan : BREAST Fam-Trastuzumab Deruxtecan-nxki  (Enhertu ) (5.4) q21d        Discussed the use of AI scribe software for clinical note transcription with the patient, who gave verbal consent to proceed.  History of Present Illness [name] is a 73 year old female with breast cancer who presents for follow-up. She is accompanied by Mr. Eischeid, her family member.  She has been prescribed exemestane  but has not started it due to concerns about joint pain and blood clots. She experiences significant joint pain affecting her mobility. Her history of blood clots and breathing issues increases her concern about starting new medications. She has not tried anastrozole or tamoxifen due to similar concerns, particularly the risk of blood clots with tamoxifen.  A breast MRI in September showed a partial response. A recent CT scan of the chest was performed last week. She is a non-smoker.  Her blood tests today show improved hemoglobin levels at 11.3, up from 10.7. She reports eating better and having an improved appetite.  She experiences neuropathy with numbness and tingling, present even before chemotherapy. She is considering a COVID shot, having already received her flu shot. No new respiratory symptoms.     All other systems were reviewed with the patient and are negative.  MEDICAL HISTORY:  Past Medical History:  Diagnosis Date   Abnormal mammogram of both breasts 02/14/2024   See 02/09/24 mammogram report     Acute MEE (middle ear effusion) 06/07/2014   Acute recurrent sinusitis 12/21/2014   Acute sinusitis 12/21/2014   Allergy    Anemia    hx   Asthma, intermittent 11/02/2018   ATTENTION DEFICIT, W/O HYPERACTIVITY 12/16/2006   Bilateral carpal tunnel syndrome 11/20/2016   Cataract    Cervical spondylosis with myelopathy, History of 04/28/2012   COLON POLYP 12/16/2006   (02/03/12) Surgical [Pathology],  sigmoid colon, polyp HYPERPLASTIC POLYP(S) AND POLYPOID FRAGMETNS OF BENIGN COLONIC MUCOSA WITH INTRAMUCOSAL LYMPHOID AGGREGATES. NO ADENOMATOUS CHANGE OR MALIGNANCY IDENTIFIED.    Demand ischemia (HCC) 05/30/2024   Associated with acute pulmonary embolism with right heart strain   DIABETES MELLITUS II, UNCOMPLICATED 12/16/2006   Fall 10/18/2020   GERD (gastroesophageal reflux disease)    H/O abnormal mammogram 07/19/2009   S/P Breast Biopsy [facility] [place], 07/2009): Benign findings.     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/20/2016   OSTEOARTHRITIS, MULTI SITES 12/16/2006   Perennial allergic rhinitis with seasonal variation 12/16/2006        PONV (postoperative nausea and vomiting)    last surgery only   Recurrent maxillary sinusitis    SACROILIITIS, HISTORY OF 01/08/2009   Qualifier: History of  By: McDiarmid MD, Todd     Seasonal allergies    Seborrheic  keratosis 01/15/2016   SKELETAL HYPEROSTOSIS 03/29/2008   Ulcer    Uterine fibroid 02/02/2011   TVUS: 4cm fibroid w/ submucosal component, bil.hydrosalpinges, unable measurable endometrium - 08/18/2004    Vulvovaginitis due to Candida 10/02/2022    SURGICAL HISTORY: Past Surgical History:  Procedure Laterality Date   ANTERIOR CERVICAL DECOMP/DISCECTOMY FUSION  04/28/2012   Procedure: ANTERIOR CERVICAL  DECOMPRESSION/DISCECTOMY FUSION 3 LEVELS;  Surgeon: Reyes JONETTA Budge, MD;  Location: [facility];  Service: Neurosurgery;  Laterality: N/A;  Cervical three-four,Cervical four-five,Cervical five-six anterior cervical decompression with fusion interbody prothesis plating and bonegraft   BREAST BIOPSY  07/19/2009   S/P Breast Biopsy [facility] [place], 07/2009): Benign findings.  (10/27/2010)   BREAST BIOPSY Left 02/25/2024   US  LT BREAST BX W LOC DEV 1ST LESION IMG BX SPEC US  GUIDE 02/25/2024 [facility]   BREAST BIOPSY Left 02/25/2024   US  LT BREAST BX W LOC DEV EA ADD LESION IMG BX SPEC US  GUIDE 02/25/2024 [facility]   BREAST BIOPSY Right 03/02/2024   US  RT BREAST BX W LOC DEV 1ST LESION IMG BX SPEC US  GUIDE 03/02/2024 [facility]   BREAST LUMPECTOMY Left    CARPAL TUNNEL RELEASE Left 12/10/2016   Procedure: LEFT CARPAL TUNNEL RELEASE;  Surgeon: Murrell Kuba, MD;  Location: [facility];  Service: Orthopedics;  Laterality: Left;   COLONOSCOPY W/ POLYPECTOMY  12/17/2000   colonoscopy (Dr Kristie) int. hemorrhoids &  nonneoplatic colon polyps - 01/10/2001,    COLONOSCOPY W/ POLYPECTOMY  01/18/2012   Dr Albertus (GI).sigmoid colon, hyperlastic polyp   ENDOMETRIAL BIOPSY  08/19/2004   Endometrial Biopsy - 09/01/2004,   IR IMAGING GUIDED PORT INSERTION  03/28/2024   NM MYOVIEW  LTD  04/18/2004   Cardiolite:EF63%, no ischemia - 05/01/2004,    REPLACEMENT TOTAL KNEE BILATERAL      I have reviewed the social history and family history with the patient and they are unchanged from previous note.  ALLERGIES:  is allergic to januvia  [sitagliptin ], jardiance  [empagliflozin ], celecoxib, and diclofenac-misoprostol.  MEDICATIONS:  Current Outpatient Medications  Medication Sig Dispense Refill   amLODipine -benazepril  (LOTREL) 10-20 MG capsule TAKE 1 CAPSULE BY MOUTH DAILY 90 capsule 3   apixaban  (ELIQUIS ) 5 MG TABS tablet Take 1 tablet (5 mg total) by mouth 2 (two) times  daily. 180 tablet 2   atorvastatin  (LIPITOR) 40 MG tablet Take 1 tablet (40 mg total) by mouth daily. (Patient not taking: Reported on 06/29/2024) 90 tablet 3   blood glucose meter kit and supplies KIT Dispense based on patient and insurance preference. Use up to four times daily as directed. (Patient not taking: Reported on 06/29/2024) 1 each 0   Blood Glucose Monitoring Suppl DEVI 1 each by Does not apply route in the morning, at noon, and at bedtime. May substitute to any manufacturer covered by patient's insurance. (Patient not taking: Reported on 06/29/2024) 1 each 0   carvedilol  (COREG ) 6.25 MG tablet TAKE 1 TABLET(6.25 MG) BY MOUTH TWICE DAILY WITH A MEAL 180 tablet 3   diphenoxylate -atropine  (LOMOTIL ) 2.5-0.025 MG tablet Take 1-2 tablets by mouth 4 (four) times daily as needed for diarrhea or loose stools. 90 tablet 1   exemestane  (AROMASIN ) 25 MG tablet Take 1 tablet (25 mg total) by mouth daily after breakfast. (Patient not taking: Reported on 08/17/2024) 30 tablet 3   fexofenadine  (ALLEGRA ) 180 MG tablet Take 180 mg by mouth daily as needed for allergies or rhinitis.     gabapentin  (NEURONTIN ) 100 MG  capsule TAKE 1 CAPSULE(100 MG) BY MOUTH THREE TIMES DAILY AS NEEDED 270 capsule 3   hydrochlorothiazide  (HYDRODIURIL ) 25 MG tablet TAKE 1 TABLET(25 MG) BY MOUTH DAILY (Patient not taking: Reported on 06/29/2024) 90 tablet 3   HYDROcodone -acetaminophen  (NORCO) 7.5-325 MG tablet Take 1 tablet by mouth See admin instructions. Take 1 tablet by mouth two to three times a day 90 tablet 0   lidocaine -prilocaine  (EMLA ) cream Apply 1 Application topically daily as needed. Use as directed for port access 30 g 3   metFORMIN  (GLUCOPHAGE -XR) 500 MG 24 hr tablet TAKE 1 TABLET(500 MG) BY MOUTH TWICE DAILY WITH A MEAL 180 tablet 3   methylphenidate  (RITALIN ) 10 MG tablet Take 2 tablets (20 mg total) by mouth 3 (three) times daily with meals. 180 tablet 0   Prenat-Fe Carbonyl-FA-Omega 3 (ONE-A-DAY WOMENS PRENATAL 1)  28-0.8-235 MG CAPS Take 1 capsule by mouth daily with breakfast.     vitamin B-12 (CYANOCOBALAMIN) 1000 MCG tablet Take 1 tablet (1,000 mcg total) by mouth daily. 180 tablet 3   No current facility-administered medications for this visit.    PHYSICAL EXAMINATION: ECOG PERFORMANCE STATUS: 1 - Symptomatic but completely ambulatory  Vitals:   08/17/24 1300 08/17/24 1358  BP: (!) 150/68 138/60  Pulse: 99   Resp: 18   Temp: 98.3 F (36.8 C)   SpO2: 99%    Wt Readings from Last 3 Encounters:  08/17/24 178 lb 1.6 oz (80.8 kg)  07/13/24 180 lb 6.4 oz (81.8 kg)  06/29/24 179 lb (81.2 kg)     GENERAL:alert, no distress and comfortable SKIN: skin color, texture, turgor are normal, no rashes or significant lesions EYES: normal, Conjunctiva are pink and non-injected, sclera clear NECK: supple, thyroid  normal size, non-tender, without nodularity LYMPH:  no palpable lymphadenopathy in the cervical, axillary  LUNGS: clear to auscultation and percussion with normal breathing effort HEART: regular rate & rhythm and no murmurs and no lower extremity edema ABDOMEN:abdomen soft, non-tender and normal bowel sounds Musculoskeletal:no cyanosis of digits and no clubbing  NEURO: alert & oriented x 3 with fluent speech, no focal motor/sensory deficits  Physical Exam    LABORATORY DATA:  I have reviewed the data as listed    Latest Ref Rng & Units 08/17/2024    1:29 PM 07/13/2024    1:18 PM 06/08/2024   11:11 AM  CBC  WBC 4.0 - 10.5 K/uL 6.5  7.8  13.4   Hemoglobin 12.0 - 15.0 g/dL 88.6  89.2  9.7   Hematocrit 36.0 - 46.0 % 35.1  32.8  31.6   Platelets 150 - 400 K/uL 294  295  562         Latest Ref Rng & Units 08/17/2024    1:29 PM 07/13/2024    1:18 PM 06/08/2024   11:11 AM  CMP  Glucose 70 - 99 mg/dL 895  887  887   BUN 8 - 23 mg/dL 11  11  7    Creatinine 0.44 - 1.00 mg/dL 9.20  9.32  9.13   Sodium 135 - 145 mmol/L 138  137  140   Potassium 3.5 - 5.1 mmol/L 4.2  3.9  4.4    Chloride 98 - 111 mmol/L 104  103  100   CO2 22 - 32 mmol/L 26  28  22    Calcium  8.9 - 10.3 mg/dL 9.7  9.5  9.3   Total Protein 6.5 - 8.1 g/dL 7.5  7.2    Total Bilirubin 0.0 - 1.2 mg/dL  0.3  0.3    Alkaline Phos 38 - 126 U/L 60  52    AST 15 - 41 U/L 13  13    ALT 0 - 44 U/L 10  9        RADIOGRAPHIC STUDIES: I have personally reviewed the radiological images as listed and agreed with the findings in the report. No results found.    No orders of the defined types were placed in this encounter.  All questions were answered. The patient knows to call the clinic with any problems, questions or concerns. No barriers to learning was detected. The total time spent in the appointment was 25 minutes, including review of chart and various tests results, discussions about plan of care and coordination of care plan     Onita Mattock, MD 08/17/2024

## 2024-08-18 ENCOUNTER — Telehealth: Payer: Self-pay | Admitting: Primary Care

## 2024-08-18 DIAGNOSIS — G4734 Idiopathic sleep related nonobstructive alveolar hypoventilation: Secondary | ICD-10-CM

## 2024-08-18 NOTE — Telephone Encounter (Signed)
 I called and spoke with patient, provided results/recommendations per [facility].  She verbalized understanding.  Nothing further needed.

## 2024-08-18 NOTE — Telephone Encounter (Signed)
 ONO 08/03/24 showed patient spent 1 hour 5 mins with SpO2 <88%. Needs polysomnography to assess for sleep apnea

## 2024-09-02 ENCOUNTER — Encounter: Payer: Self-pay | Admitting: Hematology

## 2024-09-04 ENCOUNTER — Inpatient Hospital Stay: Attending: Nurse Practitioner

## 2024-09-07 ENCOUNTER — Ambulatory Visit (HOSPITAL_COMMUNITY)
Admission: RE | Admit: 2024-09-07 | Discharge: 2024-09-07 | Disposition: A | Discharge status: Home / Self Care | Source: Ambulatory Visit | Attending: Student in an Organized Health Care Education/Training Program | Admitting: Student in an Organized Health Care Education/Training Program

## 2024-09-07 ENCOUNTER — Encounter: Payer: Self-pay | Admitting: Hematology

## 2024-09-07 DIAGNOSIS — I2609 Other pulmonary embolism with acute cor pulmonale: Secondary | ICD-10-CM | POA: Insufficient documentation

## 2024-09-07 LAB — ECHOCARDIOGRAM COMPLETE
Area-P 1/2: 2.96 cm2
S' Lateral: 2.8 cm

## 2024-09-25 ENCOUNTER — Ambulatory Visit: Admitting: Pulmonary Disease

## 2024-09-25 ENCOUNTER — Encounter: Payer: Self-pay | Admitting: Pulmonary Disease

## 2024-09-25 VITALS — BP 130/58 | HR 87 | Ht 61.0 in | Wt 186.0 lb

## 2024-09-25 DIAGNOSIS — I2699 Other pulmonary embolism without acute cor pulmonale: Secondary | ICD-10-CM | POA: Diagnosis not present

## 2024-09-25 DIAGNOSIS — I5189 Other ill-defined heart diseases: Secondary | ICD-10-CM

## 2024-09-25 DIAGNOSIS — C50212 Malignant neoplasm of upper-inner quadrant of left female breast: Secondary | ICD-10-CM | POA: Diagnosis not present

## 2024-09-25 DIAGNOSIS — Z86711 Personal history of pulmonary embolism: Secondary | ICD-10-CM

## 2024-09-25 DIAGNOSIS — R918 Other nonspecific abnormal finding of lung field: Secondary | ICD-10-CM

## 2024-09-25 DIAGNOSIS — Z87891 Personal history of nicotine dependence: Secondary | ICD-10-CM | POA: Diagnosis not present

## 2024-09-25 NOTE — Assessment & Plan Note (Addendum)
 [name]

## 2024-09-25 NOTE — Progress Notes (Signed)
 New Patient Pulmonology Office Visit   Subjective:  Patient ID: [name], female    DOB: 1951/08/26  MRN: [id number]  Referred by: McDiarmid, Krystal BIRCH, MD  CC:  Chief Complaint  Patient presents with   Medical Management of Chronic Issues    TOC- no sleep test     Discussed the use of AI scribe software for clinical note transcription with the patient, who gave verbal consent to proceed.  History of Present Illness [name] is a 73 year old female with a history of pulmonary embolism who presents for follow-up.  She was hospitalized from August 12 to 16, 2025 for shortness of breath and was diagnosed with a pulmonary embolism on CT. She was treated with a heparin  drip then transitioned to Eliquis  and has remained stable on Eliquis  without recurrent shortness of breath, including at her September follow-up.  Overnight oximetry on August 03, 2024 showed one hour and five minutes with SpO2 less than 88%. A sleep study was ordered, but she is reluctant to undergo it because she feels rested and no longer snores. She currently has no shortness of breath, snoring, or significant leg swelling and feels rested after sleep.  She has breast cancer and is followed by oncology. She started Exemestane  at half dose on August 17, 2024. Echocardiogram on September 07, 2024 showed LVEF 60-65%, grade 2 diastolic dysfunction, normal RV size and systolic function, and mildly dilated left atrium. An echocardiogram on May 31, 2024 had shown McConnell's sign.  She has back and shoulder pain that limit her walking. On a simple walk test on June 29, 2024 she completed two laps without oxygen desaturation but stopped due to pain.  She notes occasional leg swelling when she sits for long periods, without persistent or marked edema otherwise.        ROS  Allergies: Januvia  [sitagliptin ], Jardiance  [empagliflozin ], Celecoxib, and Diclofenac-misoprostol  Current Outpatient Medications:     amLODipine -benazepril  (LOTREL) 10-20 MG capsule, TAKE 1 CAPSULE BY MOUTH DAILY, Disp: 90 capsule, Rfl: 3   apixaban  (ELIQUIS ) 5 MG TABS tablet, Take 1 tablet (5 mg total) by mouth 2 (two) times daily., Disp: 180 tablet, Rfl: 2   atorvastatin  (LIPITOR) 40 MG tablet, Take 1 tablet (40 mg total) by mouth daily., Disp: 90 tablet, Rfl: 3   blood glucose meter kit and supplies KIT, Dispense based on patient and insurance preference. Use up to four times daily as directed., Disp: 1 each, Rfl: 0   Blood Glucose Monitoring Suppl DEVI, 1 each by Does not apply route in the morning, at noon, and at bedtime. May substitute to any manufacturer covered by patient's insurance., Disp: 1 each, Rfl: 0   carvedilol  (COREG ) 6.25 MG tablet, TAKE 1 TABLET(6.25 MG) BY MOUTH TWICE DAILY WITH A MEAL, Disp: 180 tablet, Rfl: 3   diphenoxylate -atropine  (LOMOTIL ) 2.5-0.025 MG tablet, Take 1-2 tablets by mouth 4 (four) times daily as needed for diarrhea or loose stools., Disp: 90 tablet, Rfl: 1   exemestane  (AROMASIN ) 25 MG tablet, Take 1 tablet (25 mg total) by mouth daily after breakfast., Disp: 30 tablet, Rfl: 3   fexofenadine  (ALLEGRA ) 180 MG tablet, Take 180 mg by mouth daily as needed for allergies or rhinitis., Disp: , Rfl:    gabapentin  (NEURONTIN ) 100 MG capsule, TAKE 1 CAPSULE(100 MG) BY MOUTH THREE TIMES DAILY AS NEEDED, Disp: 270 capsule, Rfl: 3   hydrochlorothiazide  (HYDRODIURIL ) 25 MG tablet, TAKE 1 TABLET(25 MG) BY MOUTH DAILY, Disp: 90 tablet, Rfl:  3   HYDROcodone -acetaminophen  (NORCO) 7.5-325 MG tablet, Take 1 tablet by mouth See admin instructions. Take 1 tablet by mouth two to three times a day, Disp: 90 tablet, Rfl: 0   lidocaine -prilocaine  (EMLA ) cream, Apply 1 Application topically daily as needed. Use as directed for port access, Disp: 30 g, Rfl: 3   metFORMIN  (GLUCOPHAGE -XR) 500 MG 24 hr tablet, TAKE 1 TABLET(500 MG) BY MOUTH TWICE DAILY WITH A MEAL, Disp: 180 tablet, Rfl: 3   methylphenidate  (RITALIN ) 10 MG  tablet, Take 2 tablets (20 mg total) by mouth 3 (three) times daily with meals., Disp: 180 tablet, Rfl: 0   Prenat-Fe Carbonyl-FA-Omega 3 (ONE-A-DAY WOMENS PRENATAL 1) 28-0.8-235 MG CAPS, Take 1 capsule by mouth daily with breakfast., Disp: , Rfl:    vitamin B-12 (CYANOCOBALAMIN) 1000 MCG tablet, Take 1 tablet (1,000 mcg total) by mouth daily., Disp: 180 tablet, Rfl: 3 Past Medical History:  Diagnosis Date   Abnormal mammogram of both breasts 02/14/2024   See 02/09/24 mammogram report     Acute MEE (middle ear effusion) 06/07/2014   Acute recurrent sinusitis 12/21/2014   Acute sinusitis 12/21/2014   Allergy    Anemia    hx   Asthma, intermittent 11/02/2018   ATTENTION DEFICIT, W/O HYPERACTIVITY 12/16/2006   Bilateral carpal tunnel syndrome 11/20/2016   Cataract    Cervical spondylosis with myelopathy, History of 04/28/2012   COLON POLYP 12/16/2006   (02/03/12) Surgical [Pathology], sigmoid colon, polyp HYPERPLASTIC POLYP(S) AND POLYPOID FRAGMETNS OF BENIGN COLONIC MUCOSA WITH INTRAMUCOSAL LYMPHOID AGGREGATES. NO ADENOMATOUS CHANGE OR MALIGNANCY IDENTIFIED.    Demand ischemia (HCC) 05/30/2024   Associated with acute pulmonary embolism with right heart strain   DIABETES MELLITUS II, UNCOMPLICATED 12/16/2006   Fall 10/18/2020   GERD (gastroesophageal reflux disease)    H/O abnormal mammogram 07/19/2009   S/P Breast Biopsy [facility], [place], 07/2009): Benign findings.     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/20/2016   OSTEOARTHRITIS, MULTI SITES 12/16/2006   Perennial allergic rhinitis with seasonal variation 12/16/2006        PONV (postoperative nausea and vomiting)    last surgery only   Recurrent maxillary sinusitis    SACROILIITIS, HISTORY OF 01/08/2009   Qualifier: History of  By: McDiarmid MD, Todd     Seasonal allergies    Seborrheic keratosis 01/15/2016   SKELETAL HYPEROSTOSIS 03/29/2008   Ulcer    Uterine fibroid 02/02/2011   TVUS: 4cm fibroid w/ submucosal component, bil.hydrosalpinges, unable measurable endometrium - 08/18/2004    Vulvovaginitis due to Candida 10/02/2022   Past Surgical History:  Procedure Laterality Date   ANTERIOR CERVICAL DECOMP/DISCECTOMY FUSION  04/28/2012   Procedure: ANTERIOR CERVICAL DECOMPRESSION/DISCECTOMY FUSION 3 LEVELS;  Surgeon: Reyes JONETTA Budge, MD;  Location: [facility];  Service: Neurosurgery;  Laterality: N/A;  Cervical three-four,Cervical four-five,Cervical five-six anterior  cervical decompression with fusion interbody prothesis plating and bonegraft   BREAST BIOPSY  07/19/2009   S/P Breast Biopsy [facility], [place], 07/2009): Benign findings.  (10/27/2010)   BREAST BIOPSY Left 02/25/2024   US  LT BREAST BX W LOC DEV 1ST LESION IMG BX SPEC US  GUIDE 02/25/2024 [facility]   BREAST BIOPSY Left 02/25/2024   US  LT BREAST BX W LOC DEV EA ADD LESION IMG BX SPEC US  GUIDE 02/25/2024 [facility]   BREAST BIOPSY Right 03/02/2024   US  RT BREAST BX W LOC DEV 1ST LESION IMG BX SPEC US  GUIDE 03/02/2024 [facility]   BREAST LUMPECTOMY Left    CARPAL TUNNEL RELEASE Left 12/10/2016   Procedure: LEFT CARPAL TUNNEL RELEASE;  Surgeon: Murrell Kuba, MD;  Location: [facility];  Service: Orthopedics;  Laterality: Left;   COLONOSCOPY W/ POLYPECTOMY  12/17/2000   colonoscopy (Dr Kristie) int.  hemorrhoids &  nonneoplatic colon polyps - 01/10/2001,    COLONOSCOPY W/ POLYPECTOMY  01/18/2012   Dr Albertus (GI).sigmoid colon, hyperlastic polyp   ENDOMETRIAL BIOPSY  08/19/2004   Endometrial Biopsy - 09/01/2004,   IR IMAGING GUIDED PORT INSERTION  03/28/2024   NM MYOVIEW  LTD  04/18/2004   Cardiolite:EF63%, no ischemia - 05/01/2004,    REPLACEMENT TOTAL KNEE BILATERAL     Family History  Problem Relation Age of Onset   Hypertension Mother    Arthritis Mother    Cancer Sister 44       Breast   Cancer Sister 41       Breast   Arthritis Father    Diabetes Father    Heart attack Sister    Drug abuse Sister    Colon cancer Neg Hx    Esophageal cancer Neg Hx    Rectal cancer Neg Hx    Stomach cancer Neg Hx    Social History   Socioeconomic History   Marital status: Married    Spouse name: Not on file   Number of children: 1   Years of education: Not on file   Highest education level: Not on file  Occupational History   Occupation: Dentist: RETIRED  Tobacco Use   Smoking status: Former    Current packs/day: 0.00    Average packs/day: 1 pack/day for 4.0 years (4.0 ttl pk-yrs)    Types: Cigarettes    Start date: 10/20/1975    Quit date: 10/20/1979    Years since quitting: 44.9   Smokeless tobacco: Never  Vaping Use   Vaping status: Never Used  Substance and Sexual Activity   Alcohol use: No   Drug use: No   Sexual activity: Yes  Other Topics Concern   Not on file  Social History Narrative   Lives with husband one adult son with autism and a granddgt, no etoh, Mother of 14 adopted children, many with special needs .Regular exercise-no   Social Drivers of Health   Financial Resource Strain: Low Risk  (02/28/2024)   Overall Financial Resource Strain (CARDIA)    Difficulty of Paying Living Expenses: Not hard at all  Food Insecurity: No Food Insecurity (05/30/2024)   Hunger Vital Sign    Worried About Running Out of Food in the Last Year: Never true    Ran  Out of Food in the Last Year: Never true  Transportation Needs: No Transportation Needs (05/30/2024)   PRAPARE - Administrator, Civil Service (Medical): No    Lack of Transportation (Non-Medical):  No  Physical Activity: Insufficiently Active (02/28/2024)   Exercise Vital Sign    Days of Exercise per Week: 2 days    Minutes of Exercise per Session: 20 min  Stress: No Stress Concern Present (02/28/2024)   [employer] of Occupational Health - Occupational Stress Questionnaire    Feeling of Stress : Not at all  Social Connections: Moderately Isolated (05/30/2024)   Social Connection and Isolation Panel    Frequency of Communication with Friends and Family: More than three times a week    Frequency of Social Gatherings with Friends and Family: More than three times a week    Attends Religious Services: Never    Database Administrator or Organizations: No    Attends Banker Meetings: Never    Marital Status: Married  Catering Manager Violence: Not At Risk (05/30/2024)   Humiliation, Afraid, Rape, and Kick questionnaire    Fear of Current or Ex-Partner: No    Emotionally Abused: No    Physically Abused: No    Sexually Abused: No       Objective:  BP (!) 130/58   Pulse 87   Ht 5' 1 (1.549 m) Comment: per pt  Wt 186 lb (84.4 kg)   SpO2 97%   BMI 35.14 kg/m    Physical Exam Constitutional:      General: She is not in acute distress.    Appearance: Normal appearance. She is obese.  Eyes:     General: No scleral icterus.    Conjunctiva/sclera: Conjunctivae normal.  Cardiovascular:     Rate and Rhythm: Normal rate and regular rhythm.  Pulmonary:     Breath sounds: No wheezing, rhonchi or rales.  Musculoskeletal:     Right lower leg: No edema.     Left lower leg: No edema.  Skin:    General: Skin is warm and dry.  Neurological:     General: No focal deficit present.     Diagnostic Review:  Last CBC Lab Results  Component Value Date   WBC  6.5 08/17/2024   HGB 11.3 (L) 08/17/2024   HCT 35.1 (L) 08/17/2024   MCV 79.4 (L) 08/17/2024   MCH 25.6 (L) 08/17/2024   RDW 13.2 08/17/2024   PLT 294 08/17/2024   Last metabolic panel Lab Results  Component Value Date   GLUCOSE 104 (H) 08/17/2024   NA 138 08/17/2024   K 4.2 08/17/2024   CL 104 08/17/2024   CO2 26 08/17/2024   BUN 11 08/17/2024   CREATININE 0.79 08/17/2024   GFRNONAA >60 08/17/2024   CALCIUM  9.7 08/17/2024   PROT 7.5 08/17/2024   ALBUMIN 4.1 08/17/2024   LABGLOB 2.9 11/26/2022   AGRATIO 1.4 11/26/2022   BILITOT 0.3 08/17/2024   ALKPHOS 60 08/17/2024   AST 13 (L) 08/17/2024   ALT 10 08/17/2024   ANIONGAP 8 08/17/2024    CT Chest 08/09/24 1. No acute findings of pneumonitis. 2. Incidental 2 mm solid pulmonary nodule in the left lower lobe, new from prior. As per Fleischner Society Guidelines, no routine follow-up imaging is recommended for a single solid nodule 5 mm in patients without cancer history or immunocompromise.     Assessment & Plan:   Assessment & Plan Hx of pulmonary embolus     Malignant neoplasm of upper-inner quadrant of left breast in female, estrogen receptor positive (HCC)      Assessment and Plan Assessment & Plan Pulmonary embolism  Pulmonary embolism treated with heparin  drip and transitioned to  Eliquis . Long-term anticoagulation with Eliquis  recommended due to ongoing breast cancer risks and treatment.  - Continue Eliquis  long-term as tolerated. - Monitor for signs of bleeding and adjust therapy if necessary.  Diastolic dysfunction Echocardiogram shows grade 2 diastolic dysfunction with mildly dilated left atrium. LVEF 60-65%, normal RV size and function. Improvement noted compared to previous echocardiogram. - Monitor for symptoms of exertional dyspnea.  Lung Nodules - will follow along and monitor CT Chest scan scheduled for June 2026     Return in about 6 months (around 03/26/2025) for f/u visit Dr. Kara.    Dorn KATHEE Kara, MD

## 2024-09-25 NOTE — Patient Instructions (Addendum)
 Continue eliquis  5mg  twice daily for the history of blood clots  We will schedule a follow up in June 2026 to follow up on the CT scans obtained by your Oncology team to follow up on spots on the lung.

## 2024-09-26 ENCOUNTER — Encounter: Payer: Self-pay | Admitting: Family Medicine

## 2024-09-26 DIAGNOSIS — I5189 Other ill-defined heart diseases: Secondary | ICD-10-CM | POA: Insufficient documentation

## 2024-09-26 DIAGNOSIS — I519 Heart disease, unspecified: Secondary | ICD-10-CM | POA: Insufficient documentation

## 2024-09-28 ENCOUNTER — Other Ambulatory Visit: Payer: Self-pay

## 2024-09-28 DIAGNOSIS — M47817 Spondylosis without myelopathy or radiculopathy, lumbosacral region: Secondary | ICD-10-CM

## 2024-09-28 DIAGNOSIS — Q762 Congenital spondylolisthesis: Secondary | ICD-10-CM

## 2024-09-28 DIAGNOSIS — F988 Other specified behavioral and emotional disorders with onset usually occurring in childhood and adolescence: Secondary | ICD-10-CM

## 2024-09-28 DIAGNOSIS — M48062 Spinal stenosis, lumbar region with neurogenic claudication: Secondary | ICD-10-CM

## 2024-09-28 MED ORDER — HYDROCODONE-ACETAMINOPHEN 7.5-325 MG PO TABS: 1.0000 | ORAL_TABLET | ORAL | 0 refills | Status: DC

## 2024-09-28 MED ORDER — METHYLPHENIDATE HCL 10 MG PO TABS: 20.0000 mg | ORAL_TABLET | Freq: Three times a day (TID) | ORAL | 0 refills | Status: DC

## 2024-10-06 ENCOUNTER — Telehealth: Payer: Self-pay | Admitting: *Deleted

## 2024-10-06 ENCOUNTER — Encounter: Payer: Self-pay | Admitting: *Deleted

## 2024-10-06 NOTE — Patient Instructions (Signed)
 Carole JONELLE Mulch - I am sorry I was unable to reach you today for our scheduled appointment. I work with McDiarmid, Krystal BIRCH, MD and am calling to support your healthcare needs. I will call again on 10/24/24 around 1:00pm. I look forward to speaking with you soon.   Thank you,   Josette Pellet, RN, BSN [place]  [facility] RN Care Manager Direct Dial: [phone]  Fax: [phone]

## 2024-10-10 ENCOUNTER — Encounter: Payer: Self-pay | Admitting: Family Medicine

## 2024-10-10 ENCOUNTER — Ambulatory Visit (INDEPENDENT_AMBULATORY_CARE_PROVIDER_SITE_OTHER): Admitting: Family Medicine

## 2024-10-10 VITALS — BP 142/70 | HR 76 | Ht 61.0 in | Wt 184.4 lb

## 2024-10-10 DIAGNOSIS — I152 Hypertension secondary to endocrine disorders: Secondary | ICD-10-CM | POA: Diagnosis not present

## 2024-10-10 DIAGNOSIS — E1159 Type 2 diabetes mellitus with other circulatory complications: Secondary | ICD-10-CM

## 2024-10-10 DIAGNOSIS — R41840 Attention and concentration deficit: Secondary | ICD-10-CM

## 2024-10-10 DIAGNOSIS — I2699 Other pulmonary embolism without acute cor pulmonale: Secondary | ICD-10-CM

## 2024-10-10 DIAGNOSIS — G720 Drug-induced myopathy: Secondary | ICD-10-CM

## 2024-10-10 DIAGNOSIS — G894 Chronic pain syndrome: Secondary | ICD-10-CM | POA: Diagnosis not present

## 2024-10-10 DIAGNOSIS — E1149 Type 2 diabetes mellitus with other diabetic neurological complication: Secondary | ICD-10-CM | POA: Diagnosis not present

## 2024-10-10 LAB — POCT GLYCOSYLATED HEMOGLOBIN (HGB A1C): HbA1c, POC (controlled diabetic range): 6.8 % (ref 0.0–7.0)

## 2024-10-10 NOTE — Assessment & Plan Note (Addendum)
 Established problem Well Controlled. Patient is at goal of asymptomatic. No bleeding No signs of complications, medication side effects, or red flags. Continue Apixaban  10 mg twice a day Anticipate change apixaban  2.5 mg per oral BID

## 2024-10-10 NOTE — Assessment & Plan Note (Signed)
 Established problem Well Controlled and is at goal of sustained attention allowing her to complete her iADL  PDMP review without red flags. No aberrant addictive behaviors No signs of complications, medication side effects, or red flags. Continue current medications

## 2024-10-10 NOTE — Assessment & Plan Note (Signed)
 Established problem Well Controlled. Patient is at goal of A1c < 7.5%.  No signs of complications, medication side effects, or red flags. Continue metformin  XR 500 mg twice a day

## 2024-10-10 NOTE — Patient Instructions (Addendum)
 Your A1c 6.8% shows you are under great sugar control. I recommend you continue your metformin  twice a day.   Your blood pressure could use a little better control.  I recommend you restart your hydrochlorothiazide  daily.   You are due for your Tetanus-pertussis boost (Tdap) and The Shingles vaccination (Shingrix)

## 2024-10-10 NOTE — Assessment & Plan Note (Signed)
 Established problem Well Controlled. Patient is at goal of pain control adequate to complete ADLs and iADLs prior to P.E. event No adverse effects No aberrant behaviors. PDMP with no red flags

## 2024-10-10 NOTE — Progress Notes (Signed)
 [name] is accompanied by daughter Sources of clinical information for visit is/are patient. Nursing assessment for this office visit was reviewed with the patient for accuracy and revision.   Previous Report(s) Reviewed: none     10/10/2024   10:05 AM  Depression screen PHQ 2/9  Decreased Interest 0  Down, Depressed, Hopeless 0  PHQ - 2 Score 0  Altered sleeping 0  Tired, decreased energy 1  Change in appetite 1  Feeling bad or failure about yourself  1  Trouble concentrating 0  Moving slowly or fidgety/restless 0  Suicidal thoughts 0  PHQ-9 Score 3  Difficult doing work/chores Somewhat difficult   Flowsheet Row Office Visit from 10/10/2024 in [facility] - A Dept Of [place]. [facility] Office Visit from 06/08/2024 in [facility] - [facility] Dept Of [place]. [facility] Clinical Support from 02/28/2024 in [facility] - A Dept Of [place]. [facility]  Thoughts that you would be better off dead, or of hurting yourself in some way Not at all Not at all Not at all  PHQ-9 Total Score 3 5 0       06/29/2024   10:35 AM 06/08/2024   10:35 AM 03/20/2024    4:29 PM 02/28/2024    2:26 PM 02/02/2024    2:25 PM  Fall Risk   Falls in the past year? 0 0 0 0 0  Number falls in past yr:  0 0 0 0  Injury with Fall?  0  0  0  0   Risk for fall due to :    No Fall Risks   Follow up    Falls prevention discussed;Falls evaluation completed Falls evaluation completed     Data saved with a previous flowsheet row definition       10/10/2024   10:05 AM 09/04/2024    2:54 PM 08/17/2024    1:00 PM  PHQ9 SCORE ONLY  PHQ-9 Total Score 3 0 0    There are no preventive care reminders to display for this patient.  Health Maintenance Due  Topic Date Due   Zoster Vaccines- Shingrix (1 of 2) 11/06/1968   DTaP/Tdap/Td (2 - Tdap) 01/18/2012   Bone Density Scan  01/13/2019      History/P.E. limitations: none  There are no  preventive care reminders to display for this patient.  Diabetes Health Maintenance Due  Topic Date Due   OPHTHALMOLOGY EXAM  10/24/2024   LIPID PANEL  11/08/2024   HEMOGLOBIN A1C  04/10/2025    Health Maintenance Due  Topic Date Due   Zoster Vaccines- Shingrix (1 of 2) 11/06/1968   DTaP/Tdap/Td (2 - Tdap) 01/18/2012   Bone Density Scan  01/13/2019     Chief Complaint  Patient presents with   Diabetes     Discussed the use of AI scribe software for clinical note transcription with the patient, who gave verbal consent to proceed.  History of Present Illness No AI scribe used for visit      SDOH Screenings   Food Insecurity: No Food Insecurity (05/30/2024)  Housing: Low Risk (05/30/2024)  Transportation Needs: No Transportation Needs (05/30/2024)  Utilities: Not At Risk (05/30/2024)  Alcohol Screen: Low Risk (02/28/2024)  Depression (PHQ2-9): Low Risk (10/10/2024)  Financial Resource Strain: Low Risk (02/28/2024)  Physical Activity: Insufficiently Active (02/28/2024)  Social Connections: Moderately Isolated (05/30/2024)  Stress: No Stress Concern Present (02/28/2024)  Tobacco Use:  Medium Risk (10/10/2024)  Health Literacy: Adequate Health Literacy (02/28/2024)   --------------------------------------------------------------------------------------------------------------------------------------------- Visit Problem List with Assessment and Plan   Assessment and Plan Assessment & Plan No AI scribe used for visit      Hypertension associated with diabetes (HCC) Established problem .Controlled  Patient's home SBPs in the 120 to 130s Compliant with medications  Plan Continue current Lotrel, Carvedilol , HCTZ   Acute pulmonary embolism (HCC) Established problem Well Controlled. Patient is at goal of asymptomatic. No bleeding No signs of complications, medication side effects, or red flags. Continue Apixaban  10 mg twice a day Anticipate change apixaban  2.5 mg per  oral BID   Type 2 diabetes mellitus with neurological complications (HCC) Established problem Well Controlled. Patient is at goal of A1c < 7.5%.  No signs of complications, medication side effects, or red flags. Continue metformin  XR 500 mg twice a day    Attention deficit disorder Established problem Well Controlled and is at goal of sustained attention allowing her to complete her iADL  PDMP review without red flags. No aberrant addictive behaviors No signs of complications, medication side effects, or red flags. Continue current medications  Chronic pain syndrome Established problem Well Controlled. Patient is at goal of pain control adequate to complete ADLs and iADLs prior to P.E. event No adverse effects No aberrant behaviors. PDMP with no red flags

## 2024-10-10 NOTE — Assessment & Plan Note (Signed)
 Established problem .Controlled  Patient's home SBPs in the 120 to 130s Compliant with medications  Plan Continue current Lotrel, Carvedilol , HCTZ

## 2024-10-21 ENCOUNTER — Other Ambulatory Visit: Payer: Self-pay | Admitting: Family Medicine

## 2024-10-21 DIAGNOSIS — E1159 Type 2 diabetes mellitus with other circulatory complications: Secondary | ICD-10-CM

## 2024-10-24 ENCOUNTER — Telehealth: Payer: Self-pay | Admitting: *Deleted

## 2024-10-24 ENCOUNTER — Encounter: Payer: Self-pay | Admitting: *Deleted

## 2024-10-24 NOTE — Patient Instructions (Signed)
 [name] - I am sorry I was unable to reach you today. I work with McDiarmid, Krystal BIRCH, MD and am calling to support your healthcare needs. We last spoke in June 2025 and I'm following up to see if you need any care management support. I will call again on Thursday, 10/26/24 around 1:00 pm. I look forward to speaking with you soon.   Thank you,   Josette Pellet, RN, BSN [place]  [facility] RN Care Manager Direct Dial: [phone]  Fax: [phone]

## 2024-10-26 ENCOUNTER — Telehealth: Payer: Self-pay | Admitting: *Deleted

## 2024-10-30 ENCOUNTER — Ambulatory Visit (HOSPITAL_BASED_OUTPATIENT_CLINIC_OR_DEPARTMENT_OTHER): Admitting: Pulmonary Disease

## 2024-10-31 ENCOUNTER — Other Ambulatory Visit: Payer: Self-pay

## 2024-10-31 DIAGNOSIS — Q762 Congenital spondylolisthesis: Secondary | ICD-10-CM

## 2024-10-31 DIAGNOSIS — M48062 Spinal stenosis, lumbar region with neurogenic claudication: Secondary | ICD-10-CM

## 2024-10-31 DIAGNOSIS — M47817 Spondylosis without myelopathy or radiculopathy, lumbosacral region: Secondary | ICD-10-CM

## 2024-10-31 DIAGNOSIS — F988 Other specified behavioral and emotional disorders with onset usually occurring in childhood and adolescence: Secondary | ICD-10-CM

## 2024-10-31 NOTE — Assessment & Plan Note (Signed)
-  multifocal (UIQ, central, and lateral of left breast), UIQ cT3cN1M0, ER+/PR+/HER2+, central and lateral mass are invasive mammary carcinoma, favor lobular, ER/PR strongly positive, HER2 negative by FISH -presented with screening discovered left breast cancer with nodal metastasis.  - Breast MRI showed 2.5 cm mass in the upper inner quadrant of left breast, additional suspicious clumped linear non-mass enhancement in the retroareolar left measuring 5 cm, and additional small area of non-mass enhancement in the lateral left breast spanning 1.1 cm, 1.3 centimeter abnormal lymph node in the left low axilla, no additional abnormal lymph nodes. -She underwent additional biopsy of the retroareolar and lateral breast mass, which both showed invasive mammary carcinoma, favor lobular, both ER/PR strongly positive, HER2 negative by FISH. -PET negative for distant mets  -she started neoadjuvant Enhertu  on 03/29/2024 - Patient is not interested in surgery or intensive chemotherapy, plan to change her treatment to AI and HER2 antibodies as a maintenance therapy at some point. - Patient was hospitalized on May 30, 2024 for submassive PE, required oxygen. She does not want to try Enhertu  again -breast MRI 07/09/2024 showed mixed response  -she started anastrozole in late Sep 2025, and subsequently changed to exemestane .

## 2024-11-01 ENCOUNTER — Inpatient Hospital Stay: Attending: Nurse Practitioner

## 2024-11-01 ENCOUNTER — Inpatient Hospital Stay (HOSPITAL_BASED_OUTPATIENT_CLINIC_OR_DEPARTMENT_OTHER): Admitting: Hematology

## 2024-11-01 VITALS — BP 130/70 | HR 82 | Temp 98.0°F | Resp 18 | Ht 61.0 in | Wt 190.6 lb

## 2024-11-01 DIAGNOSIS — Z86711 Personal history of pulmonary embolism: Secondary | ICD-10-CM | POA: Insufficient documentation

## 2024-11-01 DIAGNOSIS — C50212 Malignant neoplasm of upper-inner quadrant of left female breast: Secondary | ICD-10-CM

## 2024-11-01 DIAGNOSIS — Z1731 Human epidermal growth factor receptor 2 positive status: Secondary | ICD-10-CM | POA: Insufficient documentation

## 2024-11-01 DIAGNOSIS — E860 Dehydration: Secondary | ICD-10-CM | POA: Insufficient documentation

## 2024-11-01 DIAGNOSIS — M545 Low back pain, unspecified: Secondary | ICD-10-CM | POA: Insufficient documentation

## 2024-11-01 DIAGNOSIS — Z79811 Long term (current) use of aromatase inhibitors: Secondary | ICD-10-CM | POA: Insufficient documentation

## 2024-11-01 DIAGNOSIS — D649 Anemia, unspecified: Secondary | ICD-10-CM | POA: Insufficient documentation

## 2024-11-01 DIAGNOSIS — Z1721 Progesterone receptor positive status: Secondary | ICD-10-CM | POA: Diagnosis not present

## 2024-11-01 DIAGNOSIS — Z170 Estrogen receptor positive status [ER+]: Secondary | ICD-10-CM

## 2024-11-01 DIAGNOSIS — G8929 Other chronic pain: Secondary | ICD-10-CM | POA: Diagnosis not present

## 2024-11-01 DIAGNOSIS — Z9221 Personal history of antineoplastic chemotherapy: Secondary | ICD-10-CM | POA: Insufficient documentation

## 2024-11-01 LAB — CMP (CANCER CENTER ONLY)
ALT: 9 U/L (ref 0–44)
AST: 18 U/L (ref 15–41)
Albumin: 4.3 g/dL (ref 3.5–5.0)
Alkaline Phosphatase: 74 U/L (ref 38–126)
Anion gap: 14 (ref 5–15)
BUN: 16 mg/dL (ref 8–23)
CO2: 26 mmol/L (ref 22–32)
Calcium: 9.8 mg/dL (ref 8.9–10.3)
Chloride: 98 mmol/L (ref 98–111)
Creatinine: 1.12 mg/dL — ABNORMAL HIGH (ref 0.44–1.00)
GFR, Estimated: 51 mL/min — ABNORMAL LOW
Glucose, Bld: 155 mg/dL — ABNORMAL HIGH (ref 70–99)
Potassium: 4.2 mmol/L (ref 3.5–5.1)
Sodium: 139 mmol/L (ref 135–145)
Total Bilirubin: 0.3 mg/dL (ref 0.0–1.2)
Total Protein: 7.8 g/dL (ref 6.5–8.1)

## 2024-11-01 LAB — CBC WITH DIFFERENTIAL (CANCER CENTER ONLY)
Abs Immature Granulocytes: 0.02 K/uL (ref 0.00–0.07)
Basophils Absolute: 0.1 K/uL (ref 0.0–0.1)
Basophils Relative: 1 %
Eosinophils Absolute: 0.2 K/uL (ref 0.0–0.5)
Eosinophils Relative: 2 %
HCT: 33.7 % — ABNORMAL LOW (ref 36.0–46.0)
Hemoglobin: 11.2 g/dL — ABNORMAL LOW (ref 12.0–15.0)
Immature Granulocytes: 0 %
Lymphocytes Relative: 27 %
Lymphs Abs: 1.9 K/uL (ref 0.7–4.0)
MCH: 26.3 pg (ref 26.0–34.0)
MCHC: 33.2 g/dL (ref 30.0–36.0)
MCV: 79.1 fL — ABNORMAL LOW (ref 80.0–100.0)
Monocytes Absolute: 0.6 K/uL (ref 0.1–1.0)
Monocytes Relative: 8 %
Neutro Abs: 4.2 K/uL (ref 1.7–7.7)
Neutrophils Relative %: 62 %
Platelet Count: 312 K/uL (ref 150–400)
RBC: 4.26 MIL/uL (ref 3.87–5.11)
RDW: 13.9 % (ref 11.5–15.5)
WBC Count: 6.9 K/uL (ref 4.0–10.5)
nRBC: 0 % (ref 0.0–0.2)

## 2024-11-01 MED ORDER — APIXABAN 5 MG PO TABS: 5.0000 mg | ORAL_TABLET | Freq: Two times a day (BID) | ORAL | 3 refills | Status: AC

## 2024-11-01 MED ORDER — METHYLPHENIDATE HCL 10 MG PO TABS: 20.0000 mg | ORAL_TABLET | Freq: Three times a day (TID) | ORAL | 0 refills | Status: AC

## 2024-11-01 MED ORDER — HYDROCODONE-ACETAMINOPHEN 7.5-325 MG PO TABS: 1.0000 | ORAL_TABLET | ORAL | 0 refills | Status: AC

## 2024-11-01 MED ORDER — EXEMESTANE 25 MG PO TABS: 25.0000 mg | ORAL_TABLET | Freq: Every day | ORAL | 3 refills | Status: AC

## 2024-11-01 NOTE — Progress Notes (Signed)
 " [facility]   [phone]) [phone] Fax:([phone]) [phone]   Clinic Follow up Note   Patient Care Team: McDiarmid, Krystal BIRCH, MD as PCP - General [name], MD as Consulting Physician (Ophthalmology) Mavis Purchase, MD as Consulting Physician (Neurosurgery) Jaye Fallow, MD as Consulting Physician (Ophthalmology) Charlsie Josette SAILOR, RN as [facility] Management Tyree Nanetta SAILOR, RN as Oncology Nurse Navigator Lanny Callander, MD as Consulting Physician (Hematology)  Date of Service:  11/01/2024  CHIEF COMPLAINT: f/u of breast cancer  CURRENT THERAPY:  Exemestane  25 mg daily  Oncology History   Malignant neoplasm of upper-inner quadrant of left breast in female, estrogen receptor positive (HCC) -multifocal (UIQ, central, and lateral of left breast), UIQ cT3cN1M0, ER+/PR+/HER2+, central and lateral mass are invasive mammary carcinoma, favor lobular, ER/PR strongly positive, HER2 negative by FISH -presented with screening discovered left breast cancer with nodal metastasis.  - Breast MRI showed 2.5 cm mass in the upper inner quadrant of left breast, additional suspicious clumped linear non-mass enhancement in the retroareolar left measuring 5 cm, and additional small area of non-mass enhancement in the lateral left breast spanning 1.1 cm, 1.3 centimeter abnormal lymph node in the left low axilla, no additional abnormal lymph nodes. -She underwent additional biopsy of the retroareolar and lateral breast mass, which both showed invasive mammary carcinoma, favor lobular, both ER/PR strongly positive, HER2 negative by FISH. -PET negative for distant mets  -she started neoadjuvant Enhertu  on 03/29/2024 - Patient is not interested in surgery or intensive chemotherapy, plan to change her treatment to AI and HER2 antibodies as a maintenance therapy at some point. - Patient was hospitalized on May 30, 2024 for submassive PE, required oxygen. She does not want to try Enhertu   again -breast MRI 07/09/2024 showed mixed response  -she started anastrozole in late Sep 2025, and subsequently changed to exemestane .  Assessment & Plan Estrogen receptor positive, HER2 positive left breast cancer She is receiving oral anti-estrogen therapy for HER2-positive breast cancer, with manageable hot flashes as the primary side effect. She previously did not tolerate intravenous chemo-antibody therapy. Disease surveillance is ongoing. Multiple HER2-directed therapy options were discussed, including trastuzumab  (Herceptin ), dual antibody therapy, and antibody-drug conjugates. If trastuzumab  is initiated, cardiac function monitoring will be required due to risk of reversible cardiomyopathy. She prefers MRI over mammogram for follow-up imaging. - Ordered breast MRI for mid-February for disease surveillance. - Ordered laboratory studies to be performed on the same day as MRI. - Scheduled follow-up visit for one week after MRI. - Discussed additional HER2-directed therapy options including trastuzumab  (Herceptin ), dual antibody therapy (trastuzumab  and pertuzumab), and antibody-drug conjugates, with plans to revisit after imaging results. - Discussed monitoring of cardiac function with echocardiogram every three months if trastuzumab  is initiated.  Anemia She remains mildly anemic with laboratory results unchanged from prior visits. - Reviewed laboratory results.  Chronic low back pain She continues to experience severe chronic low back pain and is currently taking hydrocodone  for symptom management. - Reviewed pain medication regimen and continued current pain management with hydrocodone .  Dehydration She is mildly dehydrated, likely related to use of hydrochlorothiazide  (HCTZ). Renal function is stable. - Advised to increase oral fluid intake. - Reviewed relationship between HCTZ and dehydration.  Plan - She is tolerating exemestane  well, overall doing well clinically. - Will repeat  breast MRI next month, and follow-up in 6 weeks. - I recommend adding trastuzumab , she wants to wait until her next breast MRI.    SUMMARY OF ONCOLOGIC HISTORY: Oncology  History  Malignant neoplasm of upper-inner quadrant of left breast in female, estrogen receptor positive (HCC)  02/25/2024 Cancer Staging   Staging form: Breast, AJCC 8th Edition - Clinical stage from 02/25/2024: Stage IB (cT1c, cN1, cM0, G3, ER+, PR+, HER2+) - Signed by Lanny Callander, MD on 03/07/2024 Stage prefix: Initial diagnosis Histologic grading system: 3 grade system   03/07/2024 Initial Diagnosis   Malignant neoplasm of upper-inner quadrant of left breast in female, estrogen receptor positive (HCC)   03/29/2024 - 05/09/2024 Chemotherapy   Patient is on Treatment Plan : BREAST Fam-Trastuzumab Deruxtecan-nxki  (Enhertu ) (5.4) q21d        Discussed the use of AI scribe software for clinical note transcription with the patient, who gave verbal consent to proceed.  History of Present Illness [name] is a 74 year old female with ER-positive, HER2-positive left breast cancer who presents for routine follow-up and ongoing disease surveillance.  She has taken oral anti-estrogen therapy for 2 months after intolerance to intravenous chemo-antibody therapy. She is tolerating the current regimen with new but manageable hot flashes and no other new symptoms or treatment-related issues.  She has never palpated the breast mass. Her most recent breast imaging, including MRI, was in September. She prefers MRI to mammography for surveillance and has previously tolerated prone MRI positioning.  She previously did not tolerate intravenous HER2-directed therapy. Today, additional HER2-targeted options were discussed, including trastuzumab , dual antibody therapy, and antibody-drug conjugates.  She has severe chronic low back pain treated with hydrocodone . She is also taking apixaban  and hydrochlorothiazide , which are relevant to systemic  therapy planning and procedural risk.     All other systems were reviewed with the patient and are negative.  MEDICAL HISTORY:  Past Medical History:  Diagnosis Date   Abnormal mammogram of both breasts 02/14/2024   See 02/09/24 mammogram report     Acute MEE (middle ear effusion) 06/07/2014   Acute recurrent sinusitis 12/21/2014   Acute sinusitis 12/21/2014   Allergy    Anemia    hx   Asthma, intermittent 11/02/2018   ATTENTION DEFICIT, W/O HYPERACTIVITY 12/16/2006   Bilateral carpal tunnel syndrome 11/20/2016   Cataract    Cervical spondylosis with myelopathy, History of 04/28/2012   COLON POLYP 12/16/2006   (02/03/12) Surgical [Pathology], sigmoid colon, polyp HYPERPLASTIC POLYP(S) AND POLYPOID FRAGMETNS OF BENIGN COLONIC MUCOSA WITH INTRAMUCOSAL LYMPHOID AGGREGATES. NO ADENOMATOUS CHANGE OR MALIGNANCY IDENTIFIED.    Demand ischemia (HCC) 05/30/2024   Associated with acute pulmonary embolism with right heart strain   DIABETES MELLITUS II, UNCOMPLICATED 12/16/2006   Fall 10/18/2020   GERD (gastroesophageal reflux disease)    H/O abnormal mammogram 07/19/2009   S/P Breast Biopsy [facility], [place], 07/2009): Benign findings.     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/20/2016   OSTEOARTHRITIS, MULTI SITES 12/16/2006   Perennial allergic rhinitis with  seasonal  variation 12/16/2006        PONV (postoperative nausea and vomiting)    last surgery only   Recurrent maxillary sinusitis    SACROILIITIS, HISTORY OF 01/08/2009   Qualifier: History of  By: McDiarmid MD, Todd     Seasonal allergies    Seborrheic keratosis 01/15/2016   SKELETAL HYPEROSTOSIS 03/29/2008   Ulcer    Uterine fibroid 02/02/2011   TVUS: 4cm fibroid w/ submucosal component, bil.hydrosalpinges, unable measurable endometrium - 08/18/2004    Vulvovaginitis due to Candida 10/02/2022    SURGICAL HISTORY: Past Surgical History:  Procedure Laterality Date   ANTERIOR CERVICAL DECOMP/DISCECTOMY FUSION  04/28/2012   Procedure: ANTERIOR CERVICAL DECOMPRESSION/DISCECTOMY FUSION 3 LEVELS;  Surgeon: Reyes JONETTA Budge, MD;  Location: [facility];  Service: Neurosurgery;  Laterality: N/A;  Cervical three-four,Cervical four-five,Cervical five-six anterior cervical decompression with fusion interbody prothesis plating and bonegraft   BREAST BIOPSY  07/19/2009   S/P Breast Biopsy [facility], [place], 07/2009): Benign findings.  (10/27/2010)   BREAST BIOPSY Left 02/25/2024   US  LT BREAST BX W LOC DEV 1ST LESION IMG BX SPEC US  GUIDE 02/25/2024 [facility]   BREAST BIOPSY Left 02/25/2024   US  LT BREAST BX W LOC DEV EA ADD LESION IMG BX SPEC US  GUIDE 02/25/2024 [facility]   BREAST BIOPSY Right 03/02/2024   US  RT BREAST BX W LOC DEV 1ST LESION IMG BX SPEC US  GUIDE 03/02/2024 [facility]   BREAST LUMPECTOMY Left    CARPAL TUNNEL RELEASE Left 12/10/2016   Procedure: LEFT CARPAL TUNNEL RELEASE;  Surgeon: Murrell Kuba, MD;  Location: [facility];  Service: Orthopedics;  Laterality: Left;   COLONOSCOPY W/ POLYPECTOMY  12/17/2000   colonoscopy (Dr Kristie) int. hemorrhoids &  nonneoplatic colon polyps - 01/10/2001,    COLONOSCOPY W/ POLYPECTOMY  01/18/2012   Dr Albertus (GI).sigmoid colon, hyperlastic polyp   ENDOMETRIAL BIOPSY  08/19/2004   Endometrial Biopsy -  09/01/2004,   IR IMAGING GUIDED PORT INSERTION  03/28/2024   NM MYOVIEW  LTD  04/18/2004   Cardiolite:EF63%, no ischemia - 05/01/2004,    REPLACEMENT TOTAL KNEE BILATERAL      I have reviewed the social history and family history with the patient and they are unchanged from previous note.  ALLERGIES:  is allergic to januvia  [sitagliptin ], jardiance  [empagliflozin ], celecoxib, and diclofenac-misoprostol.  MEDICATIONS:  Current Outpatient Medications  Medication Sig Dispense Refill   amLODipine -benazepril  (LOTREL) 10-20 MG capsule TAKE 1 CAPSULE BY MOUTH DAILY 90 capsule 3   atorvastatin  (LIPITOR) 40 MG tablet Take 1 tablet (40 mg total) by mouth daily. 90 tablet 3   blood glucose meter kit and supplies KIT Dispense based on patient and insurance preference. Use up to four times daily as directed. 1 each 0   Blood Glucose Monitoring Suppl DEVI 1 each by Does not apply route in the morning, at noon, and at bedtime. May substitute to any manufacturer covered by patient's insurance. 1 each 0   carvedilol  (COREG ) 6.25 MG tablet TAKE 1 TABLET(6.25 MG) BY MOUTH TWICE DAILY WITH A MEAL 180 tablet 3   diphenoxylate -atropine  (LOMOTIL ) 2.5-0.025 MG tablet Take 1-2 tablets by mouth 4 (four) times daily as needed for diarrhea or loose stools. 90 tablet 1   fexofenadine  (ALLEGRA ) 180 MG tablet Take 180 mg by mouth daily as needed for allergies or rhinitis.     gabapentin  (NEURONTIN ) 100 MG capsule TAKE 1 CAPSULE(100 MG) BY MOUTH THREE TIMES DAILY AS NEEDED 270 capsule 3  hydrochlorothiazide  (HYDRODIURIL ) 25 MG tablet TAKE 1 TABLET(25 MG) BY MOUTH DAILY 90 tablet 3   HYDROcodone -acetaminophen  (NORCO) 7.5-325 MG tablet Take 1 tablet by mouth See admin instructions. Take 1 tablet by mouth two to three times a day 90 tablet 0   lidocaine -prilocaine  (EMLA ) cream Apply 1 Application topically daily as needed. Use as directed for port access 30 g 3   metFORMIN  (GLUCOPHAGE -XR) 500 MG 24 hr tablet TAKE 1 TABLET(500  MG) BY MOUTH TWICE DAILY WITH A MEAL 180 tablet 3   methylphenidate  (RITALIN ) 10 MG tablet Take 2 tablets (20 mg total) by mouth 3 (three) times daily with meals. 180 tablet 0   Prenat-Fe Carbonyl-FA-Omega 3 (ONE-A-DAY WOMENS PRENATAL 1) 28-0.8-235 MG CAPS Take 1 capsule by mouth daily with breakfast.     vitamin B-12 (CYANOCOBALAMIN) 1000 MCG tablet Take 1 tablet (1,000 mcg total) by mouth daily. 180 tablet 3   apixaban  (ELIQUIS ) 5 MG TABS tablet Take 1 tablet (5 mg total) by mouth 2 (two) times daily. 180 tablet 3   exemestane  (AROMASIN ) 25 MG tablet Take 1 tablet (25 mg total) by mouth daily after breakfast. 30 tablet 3   No current facility-administered medications for this visit.    PHYSICAL EXAMINATION: ECOG PERFORMANCE STATUS: 1 - Symptomatic but completely ambulatory  Vitals:   11/01/24 1300 11/01/24 1358  BP: (!) 160/57 130/70  Pulse: 82   Resp: 18   Temp: 98 F (36.7 C)   SpO2: 97%    Wt Readings from Last 3 Encounters:  11/01/24 190 lb 9.6 oz (86.5 kg)  10/10/24 184 lb 6 oz (83.6 kg)  09/25/24 186 lb (84.4 kg)     GENERAL:alert, no distress and comfortable SKIN: skin color, texture, turgor are normal, no rashes or significant lesions EYES: normal, Conjunctiva are pink and non-injected, sclera clear Musculoskeletal:no cyanosis of digits and no clubbing  NEURO: alert & oriented x 3 with fluent speech, no focal motor/sensory deficits  Physical Exam    LABORATORY DATA:  I have reviewed the data as listed    Latest Ref Rng & Units 11/01/2024    1:29 PM 08/17/2024    1:29 PM 07/13/2024    1:18 PM  CBC  WBC 4.0 - 10.5 K/uL 6.9  6.5  7.8   Hemoglobin 12.0 - 15.0 g/dL 88.7  88.6  89.2   Hematocrit 36.0 - 46.0 % 33.7  35.1  32.8   Platelets 150 - 400 K/uL 312  294  295         Latest Ref Rng & Units 11/01/2024    1:29 PM 08/17/2024    1:29 PM 07/13/2024    1:18 PM  CMP  Glucose 70 - 99 mg/dL 844  895  887   BUN 8 - 23 mg/dL 16  11  11    Creatinine 0.44 - 1.00  mg/dL 8.87  9.20  9.32   Sodium 135 - 145 mmol/L 139  138  137   Potassium 3.5 - 5.1 mmol/L 4.2  4.2  3.9   Chloride 98 - 111 mmol/L 98  104  103   CO2 22 - 32 mmol/L 26  26  28    Calcium  8.9 - 10.3 mg/dL 9.8  9.7  9.5   Total Protein 6.5 - 8.1 g/dL 7.8  7.5  7.2   Total Bilirubin 0.0 - 1.2 mg/dL 0.3  0.3  0.3   Alkaline Phos 38 - 126 U/L 74  60  52   AST 15 - 41  U/L 18  13  13    ALT 0 - 44 U/L 9  10  9        RADIOGRAPHIC STUDIES: I have personally reviewed the radiological images as listed and agreed with the findings in the report. No results found.    Orders Placed This Encounter  Procedures   MR BREAST BILATERAL W WO CONTRAST INC CAD    Standing Status:   Future    Expected Date:   11/08/2024    Expiration Date:   12/02/2024    If indicated for the ordered procedure, I authorize the administration of contrast media per Radiology protocol:   Yes    What is the patient's sedation requirement?:   No Sedation    Does the patient have a pacemaker or implanted devices?:   No    Radiology Contrast Protocol - do NOT remove file path:   [email or web address]    Preferred imaging location?:   [facility] (table limit-550lbs)   All questions were answered. The patient knows to call the clinic with any problems, questions or concerns. No barriers to learning was detected. The total time spent in the appointment was 25 minutes, including review of chart and various tests results, discussions about plan of care and coordination of care plan     Onita Mattock, MD 11/01/2024     "

## 2024-11-03 ENCOUNTER — Other Ambulatory Visit (HOSPITAL_COMMUNITY): Payer: Self-pay

## 2024-11-09 ENCOUNTER — Other Ambulatory Visit: Payer: Self-pay | Admitting: Hematology

## 2024-11-09 DIAGNOSIS — C50212 Malignant neoplasm of upper-inner quadrant of left female breast: Secondary | ICD-10-CM

## 2024-12-08 ENCOUNTER — Telehealth

## 2024-12-10 ENCOUNTER — Other Ambulatory Visit

## 2024-12-14 ENCOUNTER — Inpatient Hospital Stay: Admitting: Hematology

## 2024-12-14 ENCOUNTER — Inpatient Hospital Stay

## 2024-12-21 ENCOUNTER — Ambulatory Visit: Admitting: Family Medicine

## 2025-03-05 ENCOUNTER — Ambulatory Visit

## 2025-03-08 ENCOUNTER — Encounter
